# Patient Record
Sex: Female | Born: 1970
Health system: Southern US, Community
[De-identification: ages and names within clinical notes are randomized; demographics above are authoritative.]

## PROBLEM LIST (undated history)

## (undated) DIAGNOSIS — E785 Hyperlipidemia, unspecified: Secondary | ICD-10-CM

## (undated) DIAGNOSIS — M5430 Sciatica, unspecified side: Secondary | ICD-10-CM

## (undated) DIAGNOSIS — I2510 Atherosclerotic heart disease of native coronary artery without angina pectoris: Secondary | ICD-10-CM

## (undated) DIAGNOSIS — M1990 Unspecified osteoarthritis, unspecified site: Secondary | ICD-10-CM

## (undated) DIAGNOSIS — R112 Nausea with vomiting, unspecified: Secondary | ICD-10-CM

## (undated) DIAGNOSIS — Z9889 Other specified postprocedural states: Secondary | ICD-10-CM

## (undated) DIAGNOSIS — K5792 Diverticulitis of intestine, part unspecified, without perforation or abscess without bleeding: Secondary | ICD-10-CM

## (undated) DIAGNOSIS — I10 Essential (primary) hypertension: Secondary | ICD-10-CM

## (undated) DIAGNOSIS — G5603 Carpal tunnel syndrome, bilateral upper limbs: Secondary | ICD-10-CM

## (undated) DIAGNOSIS — I219 Acute myocardial infarction, unspecified: Secondary | ICD-10-CM

## (undated) DIAGNOSIS — E781 Pure hyperglyceridemia: Secondary | ICD-10-CM

## (undated) DIAGNOSIS — E119 Type 2 diabetes mellitus without complications: Secondary | ICD-10-CM

## (undated) HISTORY — PX: SMALL INTESTINE SURGERY: SHX150

## (undated) HISTORY — PX: CARDIAC CATHETERIZATION: SHX172

## (undated) HISTORY — PX: TUBAL LIGATION: SHX77

## (undated) HISTORY — DX: Hyperlipidemia, unspecified: E78.5

## (undated) HISTORY — PX: CHOLECYSTECTOMY: SHX55

## (undated) HISTORY — PX: JOINT REPLACEMENT: SHX530

## (undated) HISTORY — PX: KNEE SURGERY: SHX244

## (undated) HISTORY — DX: Sciatica, unspecified side: M54.30

## (undated) HISTORY — PX: OTHER SURGICAL HISTORY: SHX169

## (undated) HISTORY — DX: Acute myocardial infarction, unspecified: I21.9

---

## 1997-09-14 ENCOUNTER — Emergency Department (HOSPITAL_COMMUNITY): Admission: EM | Admit: 1997-09-14 | Discharge: 1997-09-14 | Payer: Self-pay | Admitting: Emergency Medicine

## 2001-01-30 ENCOUNTER — Other Ambulatory Visit: Admission: RE | Admit: 2001-01-30 | Discharge: 2001-01-30 | Payer: Self-pay | Admitting: Obstetrics and Gynecology

## 2001-03-05 ENCOUNTER — Encounter: Payer: Self-pay | Admitting: Obstetrics and Gynecology

## 2001-03-05 ENCOUNTER — Ambulatory Visit (HOSPITAL_COMMUNITY): Admission: RE | Admit: 2001-03-05 | Discharge: 2001-03-05 | Payer: Self-pay | Admitting: Obstetrics and Gynecology

## 2001-07-04 ENCOUNTER — Encounter: Payer: Self-pay | Admitting: Emergency Medicine

## 2001-07-04 ENCOUNTER — Emergency Department (HOSPITAL_COMMUNITY): Admission: EM | Admit: 2001-07-04 | Discharge: 2001-07-04 | Payer: Self-pay | Admitting: Emergency Medicine

## 2001-08-03 ENCOUNTER — Ambulatory Visit (HOSPITAL_COMMUNITY): Admission: AD | Admit: 2001-08-03 | Discharge: 2001-08-03 | Payer: Self-pay | Admitting: Obstetrics and Gynecology

## 2001-08-10 ENCOUNTER — Ambulatory Visit (HOSPITAL_COMMUNITY): Admission: AD | Admit: 2001-08-10 | Discharge: 2001-08-10 | Payer: Self-pay | Admitting: Obstetrics and Gynecology

## 2001-08-14 ENCOUNTER — Ambulatory Visit (HOSPITAL_COMMUNITY): Admission: AD | Admit: 2001-08-14 | Discharge: 2001-08-14 | Payer: Self-pay | Admitting: Obstetrics and Gynecology

## 2001-08-25 ENCOUNTER — Inpatient Hospital Stay (HOSPITAL_COMMUNITY): Admission: RE | Admit: 2001-08-25 | Discharge: 2001-08-26 | Payer: Self-pay | Admitting: Obstetrics and Gynecology

## 2001-10-19 ENCOUNTER — Ambulatory Visit (HOSPITAL_COMMUNITY): Admission: RE | Admit: 2001-10-19 | Discharge: 2001-10-19 | Payer: Self-pay | Admitting: Obstetrics & Gynecology

## 2001-11-04 ENCOUNTER — Emergency Department (HOSPITAL_COMMUNITY): Admission: EM | Admit: 2001-11-04 | Discharge: 2001-11-04 | Payer: Self-pay | Admitting: *Deleted

## 2001-11-04 ENCOUNTER — Encounter: Payer: Self-pay | Admitting: *Deleted

## 2003-10-16 ENCOUNTER — Ambulatory Visit (HOSPITAL_COMMUNITY): Admission: RE | Admit: 2003-10-16 | Discharge: 2003-10-16 | Payer: Self-pay | Admitting: Internal Medicine

## 2003-11-17 ENCOUNTER — Emergency Department (HOSPITAL_COMMUNITY): Admission: EM | Admit: 2003-11-17 | Discharge: 2003-11-17 | Payer: Self-pay | Admitting: Emergency Medicine

## 2004-04-14 ENCOUNTER — Ambulatory Visit (HOSPITAL_COMMUNITY): Admission: RE | Admit: 2004-04-14 | Discharge: 2004-04-14 | Payer: Self-pay | Admitting: Internal Medicine

## 2004-04-27 ENCOUNTER — Emergency Department (HOSPITAL_COMMUNITY): Admission: EM | Admit: 2004-04-27 | Discharge: 2004-04-27 | Payer: Self-pay | Admitting: Emergency Medicine

## 2005-01-25 ENCOUNTER — Ambulatory Visit (HOSPITAL_COMMUNITY): Admission: RE | Admit: 2005-01-25 | Discharge: 2005-01-25 | Payer: Self-pay | Admitting: Internal Medicine

## 2005-01-27 ENCOUNTER — Ambulatory Visit: Payer: Self-pay | Admitting: Orthopedic Surgery

## 2005-02-07 ENCOUNTER — Ambulatory Visit: Payer: Self-pay | Admitting: Orthopedic Surgery

## 2005-02-10 ENCOUNTER — Ambulatory Visit (HOSPITAL_COMMUNITY): Admission: RE | Admit: 2005-02-10 | Discharge: 2005-02-10 | Payer: Self-pay | Admitting: Orthopedic Surgery

## 2005-02-16 ENCOUNTER — Ambulatory Visit: Payer: Self-pay | Admitting: Orthopedic Surgery

## 2005-03-04 ENCOUNTER — Ambulatory Visit (HOSPITAL_COMMUNITY): Admission: RE | Admit: 2005-03-04 | Discharge: 2005-03-04 | Payer: Self-pay | Admitting: Internal Medicine

## 2005-04-21 ENCOUNTER — Observation Stay (HOSPITAL_COMMUNITY): Admission: EM | Admit: 2005-04-21 | Discharge: 2005-04-22 | Payer: Self-pay | Admitting: Emergency Medicine

## 2005-04-22 ENCOUNTER — Ambulatory Visit: Payer: Self-pay | Admitting: Internal Medicine

## 2005-11-24 ENCOUNTER — Encounter: Admission: RE | Admit: 2005-11-24 | Discharge: 2005-11-24 | Payer: Self-pay | Admitting: Internal Medicine

## 2006-04-06 ENCOUNTER — Inpatient Hospital Stay (HOSPITAL_COMMUNITY): Admission: AD | Admit: 2006-04-06 | Discharge: 2006-04-06 | Payer: Self-pay | Admitting: Obstetrics and Gynecology

## 2006-06-13 ENCOUNTER — Ambulatory Visit (HOSPITAL_COMMUNITY): Admission: RE | Admit: 2006-06-13 | Discharge: 2006-06-13 | Payer: Self-pay | Admitting: Internal Medicine

## 2006-08-11 ENCOUNTER — Emergency Department (HOSPITAL_COMMUNITY): Admission: EM | Admit: 2006-08-11 | Discharge: 2006-08-11 | Payer: Self-pay | Admitting: Emergency Medicine

## 2007-04-27 ENCOUNTER — Ambulatory Visit (HOSPITAL_COMMUNITY): Admission: RE | Admit: 2007-04-27 | Discharge: 2007-04-27 | Payer: Self-pay | Admitting: Obstetrics

## 2007-04-27 ENCOUNTER — Encounter (INDEPENDENT_AMBULATORY_CARE_PROVIDER_SITE_OTHER): Payer: Self-pay | Admitting: Obstetrics

## 2007-05-02 ENCOUNTER — Emergency Department (HOSPITAL_COMMUNITY): Admission: EM | Admit: 2007-05-02 | Discharge: 2007-05-02 | Payer: Self-pay | Admitting: *Deleted

## 2007-11-07 ENCOUNTER — Ambulatory Visit: Payer: Self-pay | Admitting: Orthopedic Surgery

## 2007-11-07 DIAGNOSIS — M545 Low back pain, unspecified: Secondary | ICD-10-CM | POA: Insufficient documentation

## 2007-11-07 DIAGNOSIS — M5430 Sciatica, unspecified side: Secondary | ICD-10-CM | POA: Insufficient documentation

## 2007-11-07 DIAGNOSIS — M5380 Other specified dorsopathies, site unspecified: Secondary | ICD-10-CM | POA: Insufficient documentation

## 2007-11-12 ENCOUNTER — Emergency Department (HOSPITAL_COMMUNITY): Admission: EM | Admit: 2007-11-12 | Discharge: 2007-11-12 | Payer: Self-pay | Admitting: Emergency Medicine

## 2007-11-12 ENCOUNTER — Telehealth: Payer: Self-pay | Admitting: Orthopedic Surgery

## 2007-11-13 ENCOUNTER — Telehealth: Payer: Self-pay | Admitting: Orthopedic Surgery

## 2008-04-14 ENCOUNTER — Emergency Department (HOSPITAL_COMMUNITY): Admission: EM | Admit: 2008-04-14 | Discharge: 2008-04-14 | Payer: Self-pay | Admitting: Emergency Medicine

## 2008-12-01 ENCOUNTER — Emergency Department (HOSPITAL_COMMUNITY): Admission: EM | Admit: 2008-12-01 | Discharge: 2008-12-01 | Payer: Self-pay | Admitting: Emergency Medicine

## 2009-02-26 ENCOUNTER — Emergency Department (HOSPITAL_COMMUNITY): Admission: EM | Admit: 2009-02-26 | Discharge: 2009-02-26 | Payer: Self-pay | Admitting: Emergency Medicine

## 2009-03-04 ENCOUNTER — Emergency Department (HOSPITAL_COMMUNITY): Admission: EM | Admit: 2009-03-04 | Discharge: 2009-03-04 | Payer: Self-pay | Admitting: Emergency Medicine

## 2010-06-13 ENCOUNTER — Emergency Department (HOSPITAL_COMMUNITY): Payer: Self-pay

## 2010-06-13 ENCOUNTER — Emergency Department (HOSPITAL_COMMUNITY)
Admission: EM | Admit: 2010-06-13 | Discharge: 2010-06-13 | Disposition: A | Discharge status: Home / Self Care | Payer: Self-pay | Attending: Emergency Medicine | Admitting: Emergency Medicine

## 2010-06-13 DIAGNOSIS — R197 Diarrhea, unspecified: Secondary | ICD-10-CM | POA: Insufficient documentation

## 2010-06-13 DIAGNOSIS — K5289 Other specified noninfective gastroenteritis and colitis: Secondary | ICD-10-CM | POA: Insufficient documentation

## 2010-06-13 DIAGNOSIS — R112 Nausea with vomiting, unspecified: Secondary | ICD-10-CM | POA: Insufficient documentation

## 2010-06-13 LAB — COMPREHENSIVE METABOLIC PANEL
ALT: 56 U/L — ABNORMAL HIGH (ref 0–35)
AST: 30 U/L (ref 0–37)
Albumin: 4.6 g/dL (ref 3.5–5.2)
CO2: 21 mEq/L (ref 19–32)
Calcium: 9.5 mg/dL (ref 8.4–10.5)
Chloride: 102 mEq/L (ref 96–112)
Creatinine, Ser: 0.87 mg/dL (ref 0.4–1.2)
GFR calc Af Amer: >60 mL/min (ref >60)
GFR calc non Af Amer: >60 mL/min (ref >60)
Potassium: 3.5 mEq/L (ref 3.5–5.1)
Sodium: 136 mEq/L (ref 135–145)
Total Bilirubin: 0.5 mg/dL (ref 0.3–1.2)
Total Protein: 8 g/dL (ref 6.0–8.3)

## 2010-06-13 LAB — URINALYSIS, ROUTINE W REFLEX MICROSCOPIC
Leukocytes, UA: NEGATIVE
Nitrite: NEGATIVE
Protein, ur: 30 mg/dL — AB
Urine Glucose, Fasting: NEGATIVE mg/dL
pH: 6 (ref 5.0–8.0)

## 2010-06-13 LAB — CBC
HCT: 48.4 % — ABNORMAL HIGH (ref 36.0–46.0)
Hemoglobin: 16.9 g/dL — ABNORMAL HIGH (ref 12.0–15.0)
MCH: 30.2 pg (ref 26.0–34.0)
MCHC: 34.9 g/dL (ref 30.0–36.0)
MCV: 86.6 fL (ref 78.0–100.0)
Platelets: 288 10*3/uL (ref 150–400)
RBC: 5.59 MIL/uL — ABNORMAL HIGH (ref 3.87–5.11)
WBC: 10.6 10*3/uL — ABNORMAL HIGH (ref 4.0–10.5)

## 2010-06-13 LAB — DIFFERENTIAL
Basophils Absolute: 0 10*3/uL (ref 0.0–0.1)
Basophils Relative: 0 % (ref 0–1)
Eosinophils Absolute: 0.1 10*3/uL (ref 0.0–0.7)
Eosinophils Relative: 1 % (ref 0–5)
Lymphocytes Relative: 18 % (ref 12–46)
Monocytes Absolute: 1 10*3/uL (ref 0.1–1.0)
Neutro Abs: 7.6 10*3/uL (ref 1.7–7.7)
Neutrophils Relative %: 72 % (ref 43–77)

## 2010-06-13 LAB — PREGNANCY, URINE: Preg Test, Ur: NEGATIVE

## 2010-06-13 LAB — URINE MICROSCOPIC-ADD ON

## 2010-09-07 NOTE — Op Note (Signed)
NAMEJAYLEENE, [name] NO.:  [id number]   MEDICAL RECORD NO.:  [id number]          PATIENT TYPE:  AMB   LOCATION:  SDC                           FACILITY:  [facility]   PHYSICIAN:  Kathreen Cosier, M.D.DATE OF BIRTH:  November 30, 1970   DATE OF PROCEDURE:  04/27/2007  DATE OF DISCHARGE:                               OPERATIVE REPORT   PREOPERATIVE DIAGNOSIS:  Dysfunctional uterine bleeding.   POSTOPERATIVE DIAGNOSIS:  Dysfunctional uterine bleeding.   PROCEDURE:  Hysteroscopy, dilation and curettage, and NovaSure ablation.   ANESTHESIA:  General.   DESCRIPTION OF PROCEDURE:  The patient in lithotomy position, perineum  and vagina prepped and draped.  Bladder emptied with a straight  catheter.  Bimanual exam revealed uterus to be top normal size.  No  prolapse.  Negative adnexa.  Speculum placed in the vagina.  Cervix  injected with 9 mL of 1% Xylocaine.  Endocervix curetted small amount of  tissue obtained.  Endometrial cavity sounded to 9 cm.  Cervix was  measured at 5 cm.  Cavity length 4 cm.  Cervix dilated with #25 Shawnie Pons.  Hysteroscope inserted.  Cavity normal.  Hysteroscope removed and sharp  curettage performed.  Moderate amount of tissue obtained.  NovaSure  device inserted in the cavity.  Integrity test passed.  Cavity width was  4 cm.  Ablation was performed for 1 minute and 40 seconds at 88 watts.  Post ablation hysteroscopy repeated.  Cavity was totally ablated.  The  patient tolerated the procedure well and went to the recovery room in  good condition.           ______________________________  Kathreen Cosier, M.D.     BAM/MEDQ  D:  04/27/2007  T:  04/27/2007  Job:  [id number]

## 2010-09-10 NOTE — Op Note (Signed)
[facility]  Patient:    [name], [name] Visit Number: [id number] MRN: [id number]          Service Type: OBS Location: 4A A417 01 Attending Physician:  Tilda Burrow Dictated by:   Pat Kocher, CNM Proc. Date: 08/25/01 Admit Date:  08/24/2001 Discharge Date: 08/26/2001   CC:         [facility]  Lilyan Punt, M.D.   Operative Report  ATTENDING NOT GIVEN  DELIVERY NOTE:  At 0445, a decision was made to perform a cesarean section due to fetal uncertain status. The patient was given two doses of terbutaline and was prepped to go to the OR. At approximately 0500, the patient developed an irresistible urge to push and was noted to be 9 cm dilated. At this point, the fetal heart rate was in the 150s average, long-term variability and no decelerations. She was allowed to proceed pushing with the OR crew in-house and the pediatrician attending. The patient delivered spontaneously at 0536 a viable female infant in the occiput posterior position. Mouth and nose were suctioned. Apgar 8 and 9, weight 6 pounds 3 ounces. The placenta separated spontaneously and was delivered at 0541. The vagina was inspected and a small second degree perineal laceration was found. Under local anesthesia, 1% xylocaine, the repair was made using a 2-0 Vicryl. The fundus was firm, very little blood loss was noted. Estimated blood loss 250 cc. After only one dose of labetalol, the patient became normotensive and has stayed that way until now. We will monitor her blood pressures closely. Anticipate a reversion back to normal blood pressures. Dictated by:   Pat Kocher, CNM Attending Physician:  Tilda Burrow DD:  08/25/01 TD:  08/27/01 Job: [id number]

## 2010-09-10 NOTE — Op Note (Signed)
NAMELAQUITTA, DOMINSKI NO.:  [id number]   MEDICAL RECORD NO.:  [id number]          PATIENT TYPE:  OBV   LOCATION:  A328                          FACILITY:  [facility]   PHYSICIAN:  Lionel December, M.D.    DATE OF BIRTH:  1971/04/12   DATE OF PROCEDURE:  04/22/2005  DATE OF DISCHARGE:                                 OPERATIVE REPORT   PROCEDURE:  Esophagogastroduodenoscopy.   INDICATION:  [name] is 40 year old Caucasian female who presents with acute  onset of nausea and vomiting. While these symptoms have cleared, she  developed melena and, therefore, undergoing this exam. Procedure and risks  were reviewed the patient, and informed consent was obtained.   MEDICINES FOR CONSCIOUS SEDATION:  Cetacaine spray for oropharyngeal topical  anesthesia Demerol 15 mg IV, Versed 6 mg IV.   FINDINGS:  Procedure performed in endoscopy suite. The patient's vital signs  and O2 saturation were monitored during the procedure and remained stable.  The patient was placed in the left lateral position and Olympus videoscope  was passed via oropharynx without any difficulty into esophagus.   ESOPHAGUS:  Mucosa of the esophagus normal. Three erosions were noted at GE  junction; one was quite prominent, and no active bleeding noted. Hiatus was  38. She had small sliding hiatal hernia.   STOMACH:  It was empty and distended very well with insufflation. Folds of  the proximal stomach were normal. Examination of mucosa revealed single  prepyloric erosion with surrounding mucosal edema, but no ulcer crater was  found. Pyloric channel was patent. Angularis, fundus, and cardia were  examined by retroflexing the scope were normal.   DUODENUM:  Bulbar mucosa was normal. Scope was passed this into the second  part of the duodenum where the mucosa and folds were normal. The endoscope  was withdrawn. The patient tolerated the procedure well.   FINAL DIAGNOSIS:  1.  Erosive reflux esophagitis.  2.   Erosions are limited to the GE junction.  3.  Small sliding hiatal hernia.  4.  No evidence of Mallory-Weiss tear or peptic ulcer disease.  5.  It is possible that her melena is secondary to trivial bleed from      erosive esophagitis, gastritis, or she could have healed Mallory-Weiss      tear.   RECOMMENDATIONS:  1.  Anti-reflux measures. Will leave her on Protonix 40 mg q.a.m..  2.  H pylori serology.      Lionel December, M.D.  Electronically Signed     NR/MEDQ  D:  04/22/2005  T:  04/22/2005  Job:  [id number]   cc:   Tesfaye D. Felecia Shelling, MD  Fax: [phone]

## 2010-09-10 NOTE — Consult Note (Signed)
NAMERONIN, CRAGER                 ACCOUNT NO.:  [id number]   MEDICAL RECORD NO.:  [id number]          PATIENT TYPE:  OBV   LOCATION:  A328                          FACILITY:  [facility]   PHYSICIAN:  Edward L. Juanetta Gosling, M.D.DATE OF BIRTH:  1971/02/27   DATE OF CONSULTATION:  04/21/2005  DATE OF DISCHARGE:                                   CONSULTATION   [name] is a 40 year old who apparently had been in Dr. Letitia Neri office  today. Was told to come to the hospital for admission but when she came to  the hospital she did not have any orders. She had been evaluated by the  emergency room physician, had lab work done, all of which was essentially  normal but she had a history of black stool and was thought to be having GI  bleeding. I have asked for her to have H&H frequently. Start her on  Protonix, continue with her home medications. Continue with close monitoring  of her hemoglobin. Check stools for blood, and then Dr. Felecia Shelling will see her  in the morning. I did not see her because apparently he had intended to go  ahead and leave orders and just through some over sight or miscommunication  it was not done.      Edward L. Juanetta Gosling, M.D.  Electronically Signed     ELH/MEDQ  D:  04/21/2005  T:  04/21/2005  Job:  [id number]

## 2010-09-10 NOTE — Op Note (Signed)
[facility]  Patient:    [name], [name] Visit Number: [id number] MRN: [id number]          Service Type: DSU Location: DAY Attending Physician:  Lazaro Arms Dictated by:   Duane Lope, M.D. Proc. Date: 10/19/01 Admit Date:  10/19/2001 Discharge Date: 10/19/2001                             Operative Report  PREOPERATIVE DIAGNOSIS:   Multiparous female desires permanent sterilization.  POSTOPERATIVE DIAGNOSIS:  Multiparous female desires permanent sterilization.  OPERATION:  Laparoscopic tubal ligation using electrocautery.  SURGEON:  Duane Lope, M.D.  ANESTHESIA:  General endotracheal.  FINDINGS:  The patient had normal uterus, tubes and ovaries.  DESCRIPTION OF PROCEDURE:  The patient was taken to the operating room and placed in the supine position and underwent general endotracheal anesthesia. She was placed in the dorsal lithotomy position.  Her vagina was prepped. Hulka tenaculum was placed for uterine manipulation.  Her abdomen was prepped and draped in the usual sterile fashion, and 0.5% Marcaine with epinephrine was injected in the umbilicus in a fan-like fashion, and I waited a couple of minutes to allow that to absorb, and then made in the incision in the umbilicus and tried to use a pistol grip under direct visualization.  However, the trocar was not long enough.  I then did an open laparoscopy, direct visualization, gaining access to the peritoneum.  That trocar still would not reach and I had to get a long trocar.  I was finally able to get that intraperitoneal.  The abdomen was insufflated.  The patient was placed in Trendelenburg position.  Both tubes were identified and burned in the distal isthmic ampullary region of each tube to no resistance and beyond using the electrocautery unit.  There was good hemostasis bilaterally.  Pictures were taken.  The gas was allowed to escape.  The instruments were removed.  The fascia was  closed with a single 0 Vicryl suture, and the skin was closed using 3-0 Vicryl in a subcuticular fashion.  The patient tolerated the procedure well.  She experienced no blood loss and was taken to the recovery room in good and stable condition as all counts were correct. Dictated by:   Duane Lope, M.D. Attending Physician:  Lazaro Arms DD:  10/19/01 TD:  10/21/01 Job: [id number]

## 2010-09-10 NOTE — H&P (Signed)
[facility]  Patient:    Bridget, [name] Visit Number: [id number] MRN: [id number]          Service Type: OBS Location: 4A A417 01 Attending Physician:  Tilda Burrow Dictated by:   Duane Lope, M.D. Admit Date:  08/24/2001 Discharge Date: 08/26/2001                           History and Physical  HISTORY OF PRESENT ILLNESS:  The patient is a 40 year old white female, gravida 3, para 2, abortus 1, who is about 7 weeks now postoperative from a cesarean section because of uncertain fetal status.  Baby is doing well. Patient is doing well.  She has elected at this time to undergo permanent sterilization.  She understands the permanent nature of the procedure and will proceed.  PAST MEDICAL HISTORY:  Pregnancy-induced hypertension with her first pregnancy.  PAST SURGICAL HISTORY:  Cholecystectomy.  PAST OBSTETRICAL HISTORY:  Two vaginal deliveries.  She was to have a C section because of uncertain fetal status but delivered spontaneously and a miscarriage in 84.  REVIEW OF SYSTEMS:  Otherwise negative.  PHYSICAL EXAMINATION:  HEENT:  Unremarkable.  NECK:  Thyroid is normal.  LUNGS:  Clear.  HEART:  Regular rate and rhythm without murmur, regurge, or gallop.  BREASTS:  Deferred.  ABDOMEN:  Benign.  Involuted uterus and pelvic exam.  Pelvic exam is completely benign.  EXTREMITIES:  Warm with no edema.  NEUROLOGIC:  Exam grossly intact.  IMPRESSION:  Multiparous female who desires permanent sterilization.  PLAN:  Patient wants a permanent sterilization procedure.  She understands the alternatives of this including birth control pills, an IUD, NuvaRing patch, Depo-Provera, but wants permanent sterilization.  She understands the risk of failure is one in 300 and also the relative irreversibility of the procedure. Dictated by:   Duane Lope, M.D. Attending Physician:  Tilda Burrow DD:  10/18/01 TD:  10/18/01 Job:  [id number]

## 2010-09-10 NOTE — H&P (Signed)
[name], [name]                 ACCOUNT NO.:  [id number]   MEDICAL RECORD NO.:  [id number]          PATIENT TYPE:  OBV   LOCATION:  A328                          FACILITY:  [facility]   PHYSICIAN:  Tesfaye D. Felecia Shelling, MD   DATE OF BIRTH:  04/06/1971   DATE OF ADMISSION:  04/21/2005  DATE OF DISCHARGE:  LH                                HISTORY & PHYSICAL   CHIEF COMPLAINT:  Nausea, vomiting and dark stool.   HISTORY OF PRESENT ILLNESS:  This is a 40 year old, female patient with no  significant past medical history who came to the office with above  complaint.  The patient was in her usual state of health until 2 days back  when she started having nausea and vomiting.  The patient also noticed dark  stool and she started feeling dizzy.  The patient came to the office where  she was evaluated.  Her stool guaiac was positive and her stool was dark and  tarry.  The patient was then sent to the emergency room for further  evaluation.  She was seen in the emergency room where her CBC and Chem 7 was  done.  The patient had no significant anemia.  She was then admitted under  observation to further monitor her CBC and do GI evaluation.  The patient is  currently feeling better.  She had no nausea or vomiting since admission.   REVIEW OF SYSTEMS:  No headache, fever, chest pain, cough, shortness of  breath, dysuria, urgency or frequency of urination.   PAST MEDICAL HISTORY:  1.  Status post cholecystectomy.  2.  Pregnancy-induced hypertension.  3.  Back pain.   CURRENT MEDICATIONS:  1.  Flexeril 5 mg p.o. q.6h. p.r.n.  2.  Hydrocodone 5/500 one tablet p.o. q.6h. p.r.n.   SOCIAL HISTORY:  The patient is married.  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  No history of alcohol or substance  abuse.  The patient smokes cigarettes.   PHYSICAL EXAMINATION:  GENERAL:  The patient is alert and awake and  comfortable.  VITAL SIGNS:  Blood pressure 97/50, pulse 66,  respirations 18, temperature  98 degrees Fahrenheit.  HEENT:  Pupils are equal and reactive.  CHEST:  Clear lung fields.  Good air entry.  CARDIAC:  S1, S2 heard.  No murmurs, rubs or gallops.  ABDOMEN:  Soft and lax.  Bowel sounds positive.  No mass or organomegaly.  EXTREMITIES:  No leg edema.   LABORATORY DATA AND X-RAY FINDINGS:  CBC with WBC 12.8, hemoglobin 13.9,  hematocrit 40.8, platelets 367.  PT 12.5, INR 0.9.  Sodium 139, potassium  3.3, chloride 105, carbon dioxide 24, glucose 82, BUN 12, creatinine 0.7.  Total bilirubin 0.3, AST 83, ALT 25, total protein 7.5, albumin 4.1, calcium  8.9.  Repeat CBC with hemoglobin 12.9 and hematocrit 37.3.   ASSESSMENT:  This is a 40 year old female patient with no significant  medical history who came with positive stool guaiac, nausea and vomiting.  She had no hematemesis, however, the patient feels dizzy.  The patient  obviously has a gastrointestinal bleed which may not be acute.   PLAN:  1.  Continue the patient on IV Protonix.  2.  Continue IV fluids.  3.  GI consult.  4.  Continue her pain medications.      Tesfaye D. Felecia Shelling, MD  Electronically Signed     TDF/MEDQ  D:  04/22/2005  T:  04/22/2005  Job:  [id number]

## 2010-09-10 NOTE — H&P (Signed)
[facility]  Patient:    [name], [name] Visit Number: [id number] MRN: [id number]          Service Type: OBS Location: 4A A414 01 Attending Physician:  Tilda Burrow Dictated by:   Chilton Si, C.N.M. Admit Date:  08/14/2001 Discharge Date: 08/14/2001   CC:         [facility]   History and Physical  CHIEF COMPLAINT:  Contractions throughout the day, becoming more regular tonight.  HISTORY OF PRESENT ILLNESS:  The patient is a 40 year old gravida 3, para 1-0-1-1, with an EDC of Sep 20, 2001, based on last menstrual period and three correlating ultrasounds, placing her at 36 weeks 2 days gestation.  She began prenatal care early in the first trimester and had regular visits throughout.  She has had a total weight gain of 36 pounds with a larger than expected fundal height growth.  AFI was normal in March.  PRENATAL LABORATORIES:  Blood type B positive.  Rubella is equivocal.  HBsAg, HIV, Gonorrhea, Chlamydia, group B strep are all normal.  One-hour GTT was abnormal at 153 with a three-hour GTT of normal limits.  HISTORY OF PREGNANCY:  Her preterm course has been complicated by some preterm labor for which she had minimal cervical change.  Blood pressures have been 120s-150s/60s-90s.  PAST MEDICAL HISTORY:  Pregnancy-induced hypertension during labor for which she received magnesium sulfate.  SURGICAL HISTORY:  Cholecystectomy.  No history of blood transfusions or anesthesia complications.  ALLERGIES:  No known drug allergies.  SOCIAL HISTORY:  She is married, is a Producer, television/film/video.  Has decreased her smoking to less than one-half a pack per day.  Denies alcohol or recreational drug use.  FAMILY HISTORY:  Diabetes, hypertension and cancer.  OBSTETRICAL HISTORY:  Includes a vaginal delivery in 1995 at 37 weeks of a 6 pound 3 ounce female for which she had hypertension during labor and a spontaneous abortion in  1990 at 8 weeks.  PHYSICAL EXAMINATION:  HEENT:  Within normal limits.  HEART:  Regular rate and rhythm.  LUNGS:  Clear to auscultation.  BREAST EXAM:  Deferred.  ABDOMEN:  Contractions at this point of moderate intensity, every 2-3 minutes. Fetal heart rate started off in the 150s with average variability, accelerations present and no decelerations, however, over the past couple of hours she has shown a tendency towards having late decelerations with most contractions.  She has been on therapy of position changes, oxygen by a rebreather mask and boluses with lactated Ringers.  Blood pressures have been 160s/80s to 190s/100s.  She has received an IV dose of labetalol at this time.  EXTREMITIES:  Trace edema in dependent extremities with normal reflexes.  PELVIC EXAM:  Upon arrival to the unit was 1 cm and 50% effaced, posterior. She has quickly made progress to 5-6, 90%, -1 station and had spontaneous rupture of membranes at approximately 2 oclock.  LABORATORY DATA:  Admission labs are within normal limits with platelets of 282.  Urinalysis was trace protein.  IMPRESSION: 1. Intrauterine pregnancy at 36 weeks 2 days. 2. Active labor. 3. Uncertain fetal status. 4. Pregnancy-induced hypertension.  PLAN:  Dr. Emelda Fear was notified.  The patient received a subcu dose of 0.25 mg of terbutaline. Dictated by:   Chilton Si, C.N.M. Attending Physician:  Tilda Burrow DD:  08/25/00 TD:  08/25/01 Job: [id number]

## 2010-09-10 NOTE — Consult Note (Signed)
[name], [name] NO.:  [id number]   MEDICAL RECORD NO.:  [id number]          PATIENT TYPE:  OBV   LOCATION:  A328                          FACILITY:  [facility]   PHYSICIAN:  Lionel December, M.D.    DATE OF BIRTH:  06/08/1970   DATE OF CONSULTATION:  04/22/2005  DATE OF DISCHARGE:                                   CONSULTATION   REFERRING PHYSICIAN:  Tesfaye D. Felecia Shelling, M.D.   REASON FOR CONSULTATION:  Melena.   HISTORY OF PRESENT ILLNESS:  [name] is a 40 year old, Caucasian female and  R.N. who was in her usual state of health until the day of Christmas when  she felt tired and just did not feel well.  She started with a period with  excessive bleeding.  She thought that was the reason for her not feeling  well.  However, on December 26, she developed nausea, vomiting and diarrhea.  She did not experience hematemesis, however, yesterday she noted her stools  were black.  By this time, her vomiting had stopped.  She did not experience  any abdominal pain.  She was seen by Dr. Felecia Shelling and noted to have tarry, heme  positive stool and therefore hospitalized for further management.  She was  begun on IV fluids and IV Protonix.  This morning, she feels better.  She  did have a bowel movement which was semi-formed and was dark, but not black  like yesterday.  There is no history of peptic ulcer disease.  She denies  chronic heartburn or dysphagia.  She states she has had a good appetite  until this illness and she has not lost any weight.  She is presently on  Protonix 40 mg IV q.24h.   MEDICATIONS PRIOR TO ADMISSION:  1.  Hydrocodone 5/500 t.i.d. p.r.n. which she does not take daily for      musculoskeletal pain.  2.  Flexeril 10 mg t.i.d. p.r.n. which she takes occasionally.  3.  Tylenol p.r.n.   PAST MEDICAL HISTORY:  1.  Hypertension.  2.  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      She has intermittent flare-ups and she takes  Flexeril and hydrocodone,      but not on daily basis.  3.  Right knee arthroscopy in 1993.  4.  Laparoscopic cholecystectomy about 6 years ago.  5.  Tubal ligation.   ALLERGIES:  No known drug allergies.   FAMILY HISTORY:  Positive for diabetes mellitus and hypertension.  Both  parents are diabetic.  One brother had insulin-dependent diabetes mellitus  and died of MI at age 84.  Father has had colonic polyps removed.  She has a  brother and sister in good health.   SOCIAL HISTORY:  She is divorced.  She has two boys in good health.  She is  now presently working at [employer] in [place].  She smokes  about 1-1/2 packs per day which she has done x10 years, but she does not  drink alcohol.   PHYSICAL EXAMINATION:  GENERAL:  Pleasant, mildly  obese, Caucasian female  who is in no acute distress.  Weight 220.4 pounds, height 5 feet 5.5 inches  tall.  VITAL SIGNS:  Pulse 76 per minute, blood pressure 118/68, temperature 98,  respirations 16.  HEENT:  Conjunctivae pink, sclerae nonicteric.  Oropharyngeal mucosa normal.  NECK:  No masses or thyromegaly.  CARDIAC:  Regular rate and rhythm with normal S1, S2, no murmurs, rubs or  gallops.  LUNGS:  Clear to auscultation.  ABDOMEN:  Full bowel sounds normal.  On palpation, soft and nontender  without organomegaly or masses.  RECTAL:  Deferred as she had one on admission.  She does not have peripheral  edema or clubbing.   LABORATORY DATA AND X-RAY FINDINGS:  WBC 12.0, H&H 13.9 and 40.8, platelet  count 367,000.  INR 0.9, PTT 29.  Sodium 137, potassium 3.3, chloride 105,  CO2 24, glucose 82, BUN 12, creatinine 0.7.  Bilirubin 0.3, Alk phos 83, AST  25, ALT 43, total protein 7.5, albumin 4.1, calcium 8.9.  H&H from this  morning is 12.9 and 37.3 which is same as last night which was 12.8 and  37.3.   ASSESSMENT:  [name] is a 40 year old, Caucasian female who presents with acute  onset of nausea, vomiting.  She then began with  tarry stools.  It appears to  be clearing.  She does not have chronic gastrointestinal symptoms.  Similarly, she does not take any nonsteroidal anti-inflammatory drugs.  I  suspect she had self-limiting gastroenteritis either viral or toxic from  which she has recovered.  She possibly bled from [facility] tear.  Other  sources would be peptic ulcer disease as well as gastritis.   She has mildly elevated ALT.  Suspect she may have fatty liver.  This needs  to be repeated and if it persists, she will need further workup to include  marker studies as well as hepatic ultrasound.   RECOMMENDATIONS:  Diagnostic esophagogastroduodenoscopy to be performed this  a.m.  I have reviewed the procedure and risks with the patient and she is  agreeable.   She should have LFTs repeated in a couple of weeks to make sure her ALT is  normal.   We would like to thank Dr. Felecia Shelling for the opportunity to participate in the  care of this nice lady.      Lionel December, M.D.  Electronically Signed     NR/MEDQ  D:  04/22/2005  T:  04/22/2005  Job:  [id number]

## 2010-09-29 ENCOUNTER — Ambulatory Visit (INDEPENDENT_AMBULATORY_CARE_PROVIDER_SITE_OTHER): Payer: PRIVATE HEALTH INSURANCE | Admitting: Orthopedic Surgery

## 2010-09-29 ENCOUNTER — Encounter: Payer: Self-pay | Admitting: Orthopedic Surgery

## 2010-09-29 ENCOUNTER — Other Ambulatory Visit: Payer: Self-pay | Admitting: Orthopedic Surgery

## 2010-09-29 VITALS — HR 70 | Resp 16 | Ht 64.0 in | Wt 209.0 lb

## 2010-09-29 DIAGNOSIS — G5600 Carpal tunnel syndrome, unspecified upper limb: Secondary | ICD-10-CM

## 2010-09-29 MED ORDER — GABAPENTIN 100 MG PO CAPS: 100.0000 mg | ORAL_CAPSULE | Freq: Three times a day (TID) | ORAL | Status: DC

## 2010-09-29 NOTE — Progress Notes (Signed)
Addended by: Fuller Canada, MD E on: 09/29/2010 02:56 PM   Modules accepted: Orders

## 2010-09-29 NOTE — Progress Notes (Signed)
Problem  Referring practice [facility]  RIGHT wrist pain RIGHT hand numbness  Symptoms since April of 2012 but off and on for years things seem to have gotten worse.  LEFT hand also onset was gradual. No injury No treatment other than a wrist brace Quality/throbbing, stabbing Intensity 9/10 Better with ice and bracing Worse with use and type Other symptoms or associated symptoms numbness and tingling  Review of systems all were reviewed positive findings for heartburn numbness tingling joint pain swelling and muscle pain  History as recorded above   General: The patient is normally developed, with normal grooming and hygiene. There are no gross deformities. The body habitus is normal   CDV: The pulse and perfusion of the extremities are normal   LYMPH: There is no gross lymphadenopathy in the extremities   Skin: There are no rashes, ulcers or cafe-au-lait spot   Psyche: The patient is alert, awake and oriented.  Mood is normal   Neuro:  The coordination and balance are normal.  Sensation is normal. Reflexes are 2+ and equal   Musculoskeletal  RIGHT hand exam tenderness is noted in the forearm and over the carpal tunnel but no numbness or tingling is noted in the hand itself specifically the thumb index and ring finger.  Grip strength seems little bit weak.  Wrist range of motion is normal wrist is stable no ganglion cysts are noted.  Spurling test negative.  Elbow range of motion normal.  Forward elevation of the RIGHT shoulder normal for negative impingement signs.  Impression atypical neuralgia RIGHT upper extremity possible carpal tunnel syndrome  Recommend carpal tunnel test Vitamin D 600 mg twice a day Neurontin 100 mg daily increase to 200 mg at night if needed.  Follow up for review of carpal tunnel test

## 2010-10-01 ENCOUNTER — Telehealth: Payer: Self-pay | Admitting: Radiology

## 2010-10-01 NOTE — Telephone Encounter (Signed)
I faxed a referral for this patient to Dr. Gerilyn Pilgrim for NCS of her upper extremities.

## 2010-10-14 ENCOUNTER — Ambulatory Visit: Payer: PRIVATE HEALTH INSURANCE | Admitting: Orthopedic Surgery

## 2010-10-21 ENCOUNTER — Encounter: Payer: Self-pay | Admitting: Orthopedic Surgery

## 2010-10-21 ENCOUNTER — Ambulatory Visit (INDEPENDENT_AMBULATORY_CARE_PROVIDER_SITE_OTHER): Payer: PRIVATE HEALTH INSURANCE | Admitting: Orthopedic Surgery

## 2010-10-21 DIAGNOSIS — G5600 Carpal tunnel syndrome, unspecified upper limb: Secondary | ICD-10-CM

## 2010-10-21 NOTE — Patient Instructions (Signed)
Preop visit has been scheduled. Please go  to the preop visit.  If you are in any blood thinners or anti-inflammatories. Please stop one One week prior to surgery.  preop day on   Post op day on

## 2010-10-25 ENCOUNTER — Telehealth: Payer: Self-pay | Admitting: Orthopedic Surgery

## 2010-10-25 NOTE — Telephone Encounter (Signed)
Contacted [employer] insurance at ph [phone] re: pre-certification for out-patient surgery scheduled 11/10/10 at [facility].

## 2010-10-25 NOTE — Progress Notes (Signed)
Covert her preop visit  Dictated history and physical later.  Visit her review of nerve conduction studies and discussing carpal tunnel treatment  The patient has had a treatment which consisted of splinting, oral medications, activity modification still has significant symptoms in the RIGHT hand.  She like to proceed with carpal tunnel release  Discussed treatment with surgery and its complications including but not limited to scar pain recurrent symptoms loss of flexion wrist or hand  Patient wishes to proceed with surgery  Nerve conduction study has been reviewed and noted to be positive for carpal tunnel syndrome.

## 2010-11-02 NOTE — Telephone Encounter (Signed)
11/02/10 Follow up with [employer] re: outpatient procedure, CPT [id number], ICD9 354.0, scheduled 11/10/10 at [facility]. Reached pre-certification department and spoke with representative Robin A;   no pre-authorization required for out-patient surgery. Her name and date for reference. Eligibility department , Cordelia Pen, relates effective as of 12/24/09 and coverage is current, as per our E-verification system.

## 2010-11-03 ENCOUNTER — Other Ambulatory Visit (HOSPITAL_COMMUNITY): Payer: PRIVATE HEALTH INSURANCE

## 2010-11-04 ENCOUNTER — Encounter (HOSPITAL_COMMUNITY): Payer: Self-pay

## 2010-11-04 ENCOUNTER — Encounter (HOSPITAL_COMMUNITY)
Admission: RE | Admit: 2010-11-04 | Discharge: 2010-11-04 | Disposition: A | Discharge status: Home / Self Care | Payer: PRIVATE HEALTH INSURANCE | Source: Ambulatory Visit | Attending: Orthopedic Surgery | Admitting: Orthopedic Surgery

## 2010-11-04 HISTORY — DX: Nausea with vomiting, unspecified: R11.2

## 2010-11-04 HISTORY — DX: Carpal tunnel syndrome, bilateral upper limbs: G56.03

## 2010-11-04 HISTORY — DX: Other specified postprocedural states: Z98.890

## 2010-11-04 LAB — BASIC METABOLIC PANEL
GFR calc Af Amer: >60 mL/min (ref >60)
GFR calc non Af Amer: >60 mL/min (ref >60)
Potassium: 4.3 mEq/L (ref 3.5–5.1)
Sodium: 138 mEq/L (ref 135–145)

## 2010-11-04 LAB — CBC
Hemoglobin: 14.3 g/dL (ref 12.0–15.0)
MCHC: 34.4 g/dL (ref 30.0–36.0)
Platelets: 331 10*3/uL (ref 150–400)
RBC: 4.74 MIL/uL (ref 3.87–5.11)

## 2010-11-04 LAB — SURGICAL PCR SCREEN
MRSA, PCR: NEGATIVE
Staphylococcus aureus: NEGATIVE

## 2010-11-04 NOTE — Patient Instructions (Addendum)
[name]  11/04/2010   Your procedure is scheduled on:  Wednesday, 11/10/10  Report to [facility] at 06:15 AM.  Call this number if you have problems the morning of surgery: [phone]   Remember:   Do not eat food:After Midnight.  Do not drink clear liquids: After Midnight.  Take these medicines the morning of surgery with A SIP OF WATER: none   Do not wear jewelry, make-up or nail polish.  Do not bring valuables to the hospital.  Contacts, dentures or bridgework may not be worn into surgery.  Leave suitcase in the car. After surgery it may be brought to your room.  For patients admitted to the hospital, checkout time is 11:00 AM the day of discharge.   Patients discharged the day of surgery will not be allowed to drive home.  Name and phone number of your driver: [name]  Special Instructions: CHG Shower Shower 2 days before surgery and 1 day before surgery with Hibiclens.   Please read over the following fact sheets that you were given: Pain Booklet, MRSA Information, Surgical Site Infection Prevention and Anesthesia Post-op Instructions    Instructions Following General Anesthetic, Adult A nurse specialized in giving anesthesia (anesthetist) or a doctor specialized in giving anesthesia (anesthesiologist) gave you a medicine that made you sleep while a procedure was performed. For as long as 24 hours following this procedure, you may feel:  Dizzy.   Weak.   Drowsy.  AFTER YOUR SURGERY 1. After surgery, you will be taken to the recovery area where a nurse will monitor your progress. You will be allowed to go home when you are awake, stable, taking fluids well, and without complications.  2.  3. For the first 24 hours following an anesthetic:   Have a responsible person with you.   Do not drive a car. If you are alone, do not take public transportation.   Do not drink alcohol.   Do not take medicine that has not been prescribed by your caregiver.   Do not sign  important papers or make important decisions.   You may resume normal diet and activities as directed.   Change bandages (dressings) as directed.   Only take over-the-counter or prescription medicines for pain, discomfort, or fever as directed by your caregiver.  If you have questions or problems that seem related to the anesthetic, call the hospital and ask for the anesthetist or anesthesiologist on call. SEEK IMMEDIATE MEDICAL CARE IF:  You develop a rash.   You have difficulty breathing.   You have chest pain.   You develop any allergic problems.  Document Released: 07/18/2000 Document Re-Released: 07/06/2009 [facility] Patient Information 2011 [place], [place]. `````````````````````````````````````````

## 2010-11-09 NOTE — H&P (Signed)
  Diagnoses     CTS (carpal tunnel syndrome)   - Primary    354.0      Reason for Visit     Hand Problem    New problem, right hand and wrist pain with numbness off and on for years, no injury, last visit 10/2007 for hip pain.       Reason For Visit History Recorded        Current Vitals       Recorded User        09/29/2010  2:32 PM  Chasity Pricilla Holm, LPN           BP Pulse Temp (Src) Resp Ht Wt    N/A  70  N/A (N/A)  16  5\' 4"  (1.626 m)  209 lb (94.802 kg)       BMI SpO2 PF LMP    35.87 kg/m2  N/A  N/A  N/A          Progress Notes     Fuller Canada, MD  09/29/2010  2:54 PM  Signed Problem   Referring practice [facility]   RIGHT wrist pain RIGHT hand numbness   Symptoms since April of 2012 but off and on for years things seem to have gotten worse.  LEFT hand also onset was gradual. No injury No treatment other than a wrist brace Quality/throbbing, stabbing Intensity 9/10 Better with ice and bracing Worse with use and type Other symptoms or associated symptoms numbness and tingling   Review of systems all were reviewed positive findings for heartburn numbness tingling joint pain swelling and muscle pain   History as recorded above    General: The patient is normally developed, with normal grooming and hygiene. There are no gross deformities. The body habitus is normal    CDV: The pulse and perfusion of the extremities are normal    LYMPH: There is no gross lymphadenopathy in the extremities    Skin: There are no rashes, ulcers or cafe-au-lait spot    Psyche: The patient is alert, awake and oriented.   Mood is normal    Neuro:   The coordination and balance are normal.   Sensation is normal. Reflexes are 2+ and equal    Musculoskeletal  RIGHT hand exam tenderness is noted in the forearm and over the carpal tunnel but no numbness or tingling is noted in the hand itself specifically the thumb index and ring finger.  Grip strength  seems little bit weak.  Wrist range of motion is normal wrist is stable no ganglion cysts are noted.   Spurling test negative.  Elbow range of motion normal.  Forward elevation of the RIGHT shoulder normal for negative impingement signs.  We sent her for a NCS and it was positive  She wishes to proceed with RIGHT CARPAL TUNNEL RELEASE   You have been scheduled for surgery.  All surgeries carry some risk.  Remember you always have the option of continued nonsurgical treatment. However in this situation the risks vs. the benefits favor surgery as the best treatment option. The risks of the surgery includes the following but is not limited to bleeding, infection, pulmonary embolus, death from anesthesia, nerve injury vascular injury or need for further surgery, continued pain.  Specific to this procedure the following risks and complications are rare but possible Incisional pain and persistent numbness

## 2010-11-10 ENCOUNTER — Encounter (HOSPITAL_COMMUNITY)
Admission: RE | Disposition: A | Discharge status: Home / Self Care | Payer: Self-pay | Source: Ambulatory Visit | Attending: Orthopedic Surgery

## 2010-11-10 ENCOUNTER — Ambulatory Visit (HOSPITAL_COMMUNITY)
Admission: RE | Admit: 2010-11-10 | Discharge: 2010-11-10 | Disposition: A | Discharge status: Home / Self Care | Payer: PRIVATE HEALTH INSURANCE | Source: Ambulatory Visit | Attending: Orthopedic Surgery | Admitting: Orthopedic Surgery

## 2010-11-10 ENCOUNTER — Encounter (HOSPITAL_COMMUNITY): Payer: Self-pay | Admitting: *Deleted

## 2010-11-10 ENCOUNTER — Ambulatory Visit (HOSPITAL_COMMUNITY): Payer: PRIVATE HEALTH INSURANCE | Admitting: Anesthesiology

## 2010-11-10 ENCOUNTER — Encounter (HOSPITAL_COMMUNITY): Payer: Self-pay | Admitting: Anesthesiology

## 2010-11-10 DIAGNOSIS — Z01812 Encounter for preprocedural laboratory examination: Secondary | ICD-10-CM | POA: Insufficient documentation

## 2010-11-10 DIAGNOSIS — G5600 Carpal tunnel syndrome, unspecified upper limb: Secondary | ICD-10-CM

## 2010-11-10 DIAGNOSIS — Z01810 Encounter for preprocedural cardiovascular examination: Secondary | ICD-10-CM | POA: Insufficient documentation

## 2010-11-10 HISTORY — PX: CARPAL TUNNEL RELEASE: SHX101

## 2010-11-10 SURGERY — CARPAL TUNNEL RELEASE
Anesthesia: Regional | Site: Hand | Laterality: Right | Wound class: Clean

## 2010-11-10 MED ORDER — CEFAZOLIN SODIUM 1-5 GM-% IV SOLN
INTRAVENOUS | Status: DC | PRN
  Administered 2010-11-10: 1 g via INTRAVENOUS

## 2010-11-10 MED ORDER — MIDAZOLAM HCL 2 MG/2ML IJ SOLN
INTRAMUSCULAR | Status: AC
  Filled 2010-11-10: qty 2

## 2010-11-10 MED ORDER — LACTATED RINGERS IV SOLN
INTRAVENOUS | Status: DC | PRN
  Administered 2010-11-10: 07:00:00 via INTRAVENOUS

## 2010-11-10 MED ORDER — MIDAZOLAM HCL 2 MG/2ML IJ SOLN
1.0000 mg | INTRAMUSCULAR | Status: DC | PRN
  Administered 2010-11-10 (×2): 2 mg via INTRAVENOUS

## 2010-11-10 MED ORDER — PROPOFOL 10 MG/ML IV EMUL
INTRAVENOUS | Status: DC | PRN
  Administered 2010-11-10: 50 ug/kg/min via INTRAVENOUS

## 2010-11-10 MED ORDER — HYDROMORPHONE HCL 4 MG PO TABS: 4.0000 mg | ORAL_TABLET | ORAL | Status: AC | PRN

## 2010-11-10 MED ORDER — ONDANSETRON HCL 4 MG/2ML IJ SOLN: 4.0000 mg | Freq: Once | INTRAMUSCULAR | Status: AC | PRN

## 2010-11-10 MED ORDER — CEFAZOLIN SODIUM 1-5 GM-% IV SOLN: 1.0000 g | Freq: Once | INTRAVENOUS | Status: DC

## 2010-11-10 MED ORDER — BUPIVACAINE HCL (PF) 0.5 % IJ SOLN
INTRAMUSCULAR | Status: AC
  Filled 2010-11-10: qty 30

## 2010-11-10 MED ORDER — PROMETHAZINE HCL 25 MG PO TABS: 25.0000 mg | ORAL_TABLET | Freq: Four times a day (QID) | ORAL | Status: DC | PRN

## 2010-11-10 MED ORDER — PROPOFOL 10 MG/ML IV EMUL
INTRAVENOUS | Status: AC
  Filled 2010-11-10: qty 20

## 2010-11-10 MED ORDER — BUPIVACAINE HCL (PF) 0.5 % IJ SOLN
INTRAMUSCULAR | Status: DC | PRN
  Administered 2010-11-10: 10 mL

## 2010-11-10 MED ORDER — SODIUM CHLORIDE 0.9 % IJ SOLN
INTRAMUSCULAR | Status: AC
  Filled 2010-11-10: qty 10

## 2010-11-10 MED ORDER — FENTANYL CITRATE 0.05 MG/ML IJ SOLN: 25.0000 ug | INTRAMUSCULAR | Status: DC | PRN

## 2010-11-10 MED ORDER — FENTANYL CITRATE 0.05 MG/ML IJ SOLN
INTRAMUSCULAR | Status: DC | PRN
  Administered 2010-11-10 (×2): 50 ug via INTRAVENOUS

## 2010-11-10 MED ORDER — CEFAZOLIN SODIUM 1-5 GM-% IV SOLN
INTRAVENOUS | Status: AC
  Filled 2010-11-10: qty 50

## 2010-11-10 MED ORDER — SODIUM CHLORIDE 0.9 % IR SOLN
Status: DC | PRN
  Administered 2010-11-10: 1000 mL

## 2010-11-10 MED ORDER — HYDROCODONE-ACETAMINOPHEN 5-325 MG PO TABS: 1.0000 | ORAL_TABLET | Freq: Once | ORAL | Status: DC

## 2010-11-10 MED ORDER — HYDROCODONE-ACETAMINOPHEN 5-325 MG PO TABS
ORAL_TABLET | ORAL | Status: AC
  Filled 2010-11-10: qty 1

## 2010-11-10 MED ORDER — FENTANYL CITRATE 0.05 MG/ML IJ SOLN
INTRAMUSCULAR | Status: AC
  Filled 2010-11-10: qty 2

## 2010-11-10 MED ORDER — SODIUM CHLORIDE 0.9 % IV SOLN: INTRAVENOUS | Status: DC

## 2010-11-10 MED ORDER — LACTATED RINGERS IV SOLN
INTRAVENOUS | Status: DC
  Administered 2010-11-10: 07:00:00 via INTRAVENOUS
  Filled 2010-11-10: qty 1000

## 2010-11-10 MED ORDER — LIDOCAINE HCL (PF) 1 % IJ SOLN
INTRAMUSCULAR | Status: AC
  Filled 2010-11-10: qty 5

## 2010-11-10 MED ORDER — LIDOCAINE HCL (PF) 0.5 % IJ SOLN
INTRAMUSCULAR | Status: AC
  Filled 2010-11-10: qty 50

## 2010-11-10 SURGICAL SUPPLY — 39 items
BAG HAMPER (MISCELLANEOUS) ×2 IMPLANT
BANDAGE ELASTIC 2 VELCRO NS LF (GAUZE/BANDAGES/DRESSINGS) ×2 IMPLANT
BANDAGE ESMARK 4X12 BL STRL LF (DISPOSABLE) ×1 IMPLANT
BLADE SURG 15 STRL LF DISP TIS (BLADE) IMPLANT
BLADE SURG 15 STRL SS (BLADE) ×2
BNDG CMPR 12X4 ELC STRL LF (DISPOSABLE) ×1
BNDG COHESIVE 4X5 TAN NS LF (GAUZE/BANDAGES/DRESSINGS) ×2 IMPLANT
BNDG ESMARK 4X12 BLUE STRL LF (DISPOSABLE) ×2
CHLORAPREP W/TINT 26ML (MISCELLANEOUS) ×2 IMPLANT
CLOTH BEACON ORANGE TIMEOUT ST (SAFETY) ×2 IMPLANT
COVER LIGHT HANDLE STERIS (MISCELLANEOUS) ×4 IMPLANT
CUFF TOURNIQUET SINGLE 18IN (TOURNIQUET CUFF) ×2 IMPLANT
DECANTER SPIKE VIAL GLASS SM (MISCELLANEOUS) ×2 IMPLANT
DRSG XEROFORM 1X8 (GAUZE/BANDAGES/DRESSINGS) ×1 IMPLANT
ELECT NDL TIP 2.8 STRL (NEEDLE) ×1 IMPLANT
ELECT NEEDLE TIP 2.8 STRL (NEEDLE) ×2 IMPLANT
ELECT REM PT RETURN 9FT ADLT (ELECTROSURGICAL) ×2
ELECTRODE REM PT RTRN 9FT ADLT (ELECTROSURGICAL) ×1 IMPLANT
GLOVE BIOGEL PI IND STRL 7.5 (GLOVE) IMPLANT
GLOVE BIOGEL PI INDICATOR 7.5 (GLOVE) ×1
GLOVE ECLIPSE 7.0 STRL STRAW (GLOVE) ×1 IMPLANT
GLOVE SKINSENSE NS SZ8.0 LF (GLOVE) ×1
GLOVE SKINSENSE STRL SZ8.0 LF (GLOVE) ×1 IMPLANT
GLOVE SS N UNI LF 8.5 STRL (GLOVE) ×2 IMPLANT
GOWN BRE IMP SLV AUR XL STRL (GOWN DISPOSABLE) ×2 IMPLANT
GOWN STRL REIN XL XLG (GOWN DISPOSABLE) ×2 IMPLANT
HAND ALUMI XLG (SOFTGOODS) ×2 IMPLANT
KIT ROOM TURNOVER APOR (KITS) ×2 IMPLANT
MANIFOLD NEPTUNE II (INSTRUMENTS) ×2 IMPLANT
NDL HYPO 21X1.5 SAFETY (NEEDLE) ×1 IMPLANT
NEEDLE HYPO 21X1.5 SAFETY (NEEDLE) ×2 IMPLANT
NS IRRIG 1000ML POUR BTL (IV SOLUTION) ×2 IMPLANT
PACK BASIC LIMB (CUSTOM PROCEDURE TRAY) ×2 IMPLANT
PAD ARMBOARD 7.5X6 YLW CONV (MISCELLANEOUS) ×2 IMPLANT
SET BASIN LINEN APH (SET/KITS/TRAYS/PACK) ×2 IMPLANT
SPONGE GAUZE 4X4 12PLY (GAUZE/BANDAGES/DRESSINGS) ×1 IMPLANT
SUT ETHILON 3 0 FSL (SUTURE) ×3 IMPLANT
SYR CONTROL 10ML LL (SYRINGE) ×2 IMPLANT
TOWEL OR 17X26 4PK STRL BLUE (TOWEL DISPOSABLE) ×1 IMPLANT

## 2010-11-10 NOTE — Brief Op Note (Signed)
11/10/2010  8:25 AM  PATIENT:  [name]  40 y.o. female  PRE-OPERATIVE DIAGNOSIS:  carpal tunnel syndrome right hand  POST-OPERATIVE DIAGNOSIS:  carpal tunnel syndrome right hand  PROCEDURE:  Procedure(s): CARPAL TUNNEL RELEASE  SURGEON:  Surgeon(s): Fuller Canada, MD  PHYSICIAN ASSISTANT:   ASSISTANTS: none   ANESTHESIA:   regional  ESTIMATED BLOOD LOSS: * No blood loss amount entered *   BLOOD ADMINISTERED:none  DRAINS: none   LOCAL MEDICATIONS USED:  MARCAINE 10CC  SPECIMEN:  No Specimen  DISPOSITION OF SPECIMEN:  N/A  COUNTS:  YES  TOURNIQUET:   Total Tourniquet Time Documented: Upper Arm (Right) - 27 minutes  DICTATION #:   PLAN OF CARE: going home   PATIENT DISPOSITION:  PACU - hemodynamically stable.   Delay start of Pharmacological VTE agent (>24hrs) due to surgical blood loss or risk of bleeding:  not applicable

## 2010-11-10 NOTE — H&P (Signed)
H/P 7.18.2012 Referring practice [facility]  RIGHT wrist pain RIGHT hand numbness  Symptoms since April of 2012 but off and on for years things seem to have gotten worse. LEFT hand also onset was gradual.  No injury  No treatment other than a wrist brace  Quality/throbbing, stabbing  Intensity 9/10  Better with ice and bracing  Worse with use and type  Other symptoms or associated symptoms numbness and tingling  Review of systems all were reviewed positive findings for heartburn numbness tingling joint pain swelling and muscle pain  History as recorded above  General: The patient is normally developed, with normal grooming and hygiene. There are no gross deformities. The body habitus is normal  CDV: The pulse and perfusion of the extremities are normal  LYMPH: There is no gross lymphadenopathy in the extremities  Skin: There are no rashes, ulcers or cafe-au-lait spot  Psyche: The patient is alert, awake and oriented.  Mood is normal  Neuro:  The coordination and balance are normal.  Sensation is normal.  Reflexes are 2+ and equal  Musculoskeletal  RIGHT hand exam tenderness is noted in the forearm and over the carpal tunnel but no numbness or tingling is noted in the hand itself specifically the thumb index and ring finger. Grip strength seems little bit weak. Wrist range of motion is normal wrist is stable no ganglion cysts are noted.  Spurling test negative. Elbow range of motion normal. Forward elevation of the RIGHT shoulder normal for negative impingement signs.  Impression atypical neuralgia RIGHT upper extremity possible carpal tunnel syndrome  NCS was positive   Plan RT Carpal Tunnel Release   Vickki Hearing, MD, Montez Hageman. [facility]

## 2010-11-10 NOTE — Anesthesia Preprocedure Evaluation (Addendum)
Anesthesia Evaluation  Name, MR# and DOB Patient awake  General Assessment Comment  Reviewed: Allergy & Precautions, H&P  and Patient's Chart, lab work & pertinent test results  History of Anesthesia Complications Negative for: history of anesthetic complications (denies PONV)  Airway Mallampati: II TM Distance: >3 FB Neck ROM: Full  Mouth opening: Limited Mouth Opening  Dental  (+) Teeth Intact   Pulmonary    pulmonary exam normal   Cardiovascular Regular Normal   Neuro/Psych Neuromuscular disease (chronic LBP & sciatica)  GI/Hepatic/Renal   Endo/Other   Abdominal   Musculoskeletal  Hematology   Peds  Reproductive/Obstetrics   Anesthesia Other Findings             Anesthesia Physical Anesthesia Plan  ASA: II  Anesthesia Plan: Bier Block   Post-op Pain Management:    Induction:   Airway Management Planned: Nasal Cannula  Additional Equipment:   Intra-op Plan:   Post-operative Plan:   Informed Consent: I have reviewed the patients History and Physical, chart, labs and discussed the procedure including the risks, benefits and alternatives for the proposed anesthesia with the patient or authorized representative who has indicated his/her understanding and acceptance.     Plan Discussed with:   Anesthesia Plan Comments: (Propofol infusion for sedation)        Anesthesia Quick Evaluation

## 2010-11-10 NOTE — Brief Op Note (Signed)
11/10/2010  8:30 AM  PATIENT:  [name]  40 y.o. female  PRE-OPERATIVE DIAGNOSIS:  carpal tunnel syndrome right hand  POST-OPERATIVE DIAGNOSIS:  carpal tunnel syndrome right hand  PROCEDURE:  Procedure(s): CARPAL TUNNEL RELEASE  SURGEON:  Surgeon(s): Fuller Canada, MD  PHYSICIAN ASSISTANT:   ASSISTANTS: no   ANESTHESIA:   regional  ESTIMATED BLOOD LOSS: * No blood loss amount entered *   BLOOD ADMINISTERED:none  DRAINS: none   LOCAL MEDICATIONS USED:  MARCAINE 10CC  SPECIMEN:  No Specimen  DISPOSITION OF SPECIMEN:  N/A  COUNTS:  YES  TOURNIQUET:   Total Tourniquet Time Documented: Upper Arm (Right) - 27 minutes  DICTATION #  PLAN OF CARE:  PATIENT DISPOSITION:  PACU - hemodynamically stable.   Delay start of Pharmacological VTE agent (>24hrs) due to surgical blood loss or risk of bleeding:  not applicable

## 2010-11-10 NOTE — Anesthesia Procedure Notes (Signed)
Regional Block:  Bier block (IV Regional)  Pre-Anesthetic Checklist: ,, timeout performed, Correct Patient, Correct Site, Correct Laterality, Correct Procedure, Correct Position, site marked, Risks and benefits discussed, Surgical consent,  Pre-op evaluation,  At surgeon's request  Laterality: Right     Needles:  Injection technique: Single-shot      Additional Needles: Bier block (IV Regional) Narrative:  Start time: 11/10/2010 7:52 AM Resident/CRNA: Hosam Mcfetridge,CRNA  Additional Notes: #22 Gauge iv in right hand.  Rt arm exsanguinated, tourniquet up on Rt arm at 0752.  Lidocaine .5%, 50cc injected in Rt hand saline lock.

## 2010-11-10 NOTE — Op Note (Signed)
Operative note RIGHT carpal tunnel release date of surgery November 10, 2010.  Preop diagnosis carpal tunnel syndrome RIGHT wrist Postop diagnosis carpal tunnel syndrome RIGHT wrist Procedure open RIGHT carpal tunnel release Surgeon Romeo Apple Anesthetic regional Bier block Operative findings stenotic carpal tunnel Details of procedure: The patient was identified in the preop holding area and the surgical site was marked encounter signed.  The history and physical update was completed.  Antibiotics were ordered and the patient was given antibiotics and taken to surgery.  In the surgical suite a Bier block was done and then the arm was prepped and draped in sterile technique.  At this point the timeout procedure was initiated and completed.  The incision was made over the carpal tunnel and extended to the palmar fascia.  The palmar fascia was divided bluntly and dissection was carried out to the distal aspect of the transverse carpal ligament.  Blunt dissection was carried out beneath the ligament and sharp dissection was used to release the ligament.  The contents of the carpal tunnel were inspected and found to be free of any other pathology.  Irrigation was performed.  Closure was with interrupted 3-0 nylon suture.  10 cc of plain Sensorcaine was injected around the incision edges.  A sterile bandage was applied.  The tourniquet was released the color of the fingers was normal and the capillary refill was normal.  The patient is transferred to Recovery area and will be discharged home.

## 2010-11-10 NOTE — Interval H&P Note (Signed)
History and Physical Interval Note:   11/10/2010   7:20 AM   [name]  has presented today for surgery, with the diagnosis of carpal tunnel syndrome right hand  The various methods of treatment have been discussed with the patient and family. After consideration of risks, benefits and other options for treatment, the patient has consented to  Procedure(s): CARPAL TUNNEL RELEASE as a surgical intervention .  I have reviewed the patients' chart and labs.  Questions were answered to the patient's satisfaction.     Fuller Canada  MD   The patient has been checked today and there is no change in their condition

## 2010-11-10 NOTE — Anesthesia Postprocedure Evaluation (Signed)
  Anesthesia Post-op Note  Patient: [name]  Procedure(s) Performed:  CARPAL TUNNEL RELEASE  Patient Location: PACU and Short Stay  Anesthesia Type: MAC  Level of Consciousness: awake, alert  and oriented  Airway and Oxygen Therapy: Patient Spontanous Breathing  Post-op Pain: mild  Post-op Assessment: Post-op Vital signs reviewed, Patient's Cardiovascular Status Stable and Respiratory Function Stable  Post-op Vital Signs: stable  Complications: No apparent anesthesia complications

## 2010-11-10 NOTE — Addendum Note (Signed)
Addendum  created 11/10/10 0856 by Glynn Octave   Modules edited:Charges VN

## 2010-11-10 NOTE — Transfer of Care (Signed)
Immediate Anesthesia Transfer of Care Note  Patient: [name]  Procedure(s) Performed:  CARPAL TUNNEL RELEASE  Patient Location: PACU and Short Stay  Anesthesia Type: Bier block  Level of Consciousness: awake, alert  and oriented  Airway & Oxygen Therapy: Patient Spontanous Breathing  Post-op Assessment: Report given to PACU RN  Post vital signs: stable  Complications: No apparent anesthesia complications

## 2010-11-15 ENCOUNTER — Ambulatory Visit (INDEPENDENT_AMBULATORY_CARE_PROVIDER_SITE_OTHER): Payer: PRIVATE HEALTH INSURANCE | Admitting: Orthopedic Surgery

## 2010-11-15 DIAGNOSIS — Z9889 Other specified postprocedural states: Secondary | ICD-10-CM

## 2010-11-15 DIAGNOSIS — G5600 Carpal tunnel syndrome, unspecified upper limb: Secondary | ICD-10-CM

## 2010-11-15 NOTE — Progress Notes (Signed)
Postoperative visit  Procedure: (RIGHT  CTR)  Date of procedure July 18  Diagnosis Carpal Tunnel Syndrome   Operative findings Median Nerve compression  Appearance of incision Clean and dry  ROM Improving  Patient complaints Numbness small finger none in the thumb, index, long or ring finger  Plan Return for suture removal in 8 days

## 2010-11-15 NOTE — Patient Instructions (Addendum)
Keep clean and dry   Shower ok  Move fingers   Return in 8 days for suture removal

## 2010-11-16 ENCOUNTER — Telehealth: Payer: Self-pay | Admitting: Orthopedic Surgery

## 2010-11-16 NOTE — Telephone Encounter (Signed)
Patient called today (had appointment here yesterday, post op 1), states that she tried to refill her Hydromorphin at [facility] and was told she needs a "hard script", and that they cannot fax a request for this.  Her ph# is [phone]. Patient is due in 1 week for follow up.

## 2010-11-16 NOTE — Telephone Encounter (Signed)
forwarded

## 2010-11-16 NOTE — Telephone Encounter (Signed)
What?

## 2010-11-16 NOTE — Op Note (Signed)
NAMELERA, GAINES NO.:  [id number]  MEDICAL RECORD NO.:  [id number]  LOCATION:  [facility]                          FACILITY:  [facility]  PHYSICIAN:  Vickki Hearing, M.D.DATE OF BIRTH:  1970/11/26  DATE OF PROCEDURE:  11/10/2010 DATE OF DISCHARGE:  11/10/2010                              OPERATIVE REPORT   PREOPERATIVE DIAGNOSIS:  Carpal tunnel syndrome, right hand.  POSTOPERATIVE DIAGNOSIS:  Carpal tunnel syndrome, right hand.  PROCEDURE:  Open right carpal tunnel release.  SURGEON:  Vickki Hearing, MD  No assistants.  ANESTHESIA:  Regional.  10 mL Marcaine was used postop.  No blood loss.  No drains.  No specimens.  After site marking, the patient was taken to the operating room, given preoperative antibiotics.  She had a Bier block.  After successful Bier block, time-out procedure was executed.  After sterile prep and drape, an incision was made over the carpal tunnel.  Subcutaneous tissue was divided sharply.  The distal aspect of the transverse carpal ligament was exposed.  Blunt dissection was carried out beneath the ligament.  The nerve was protected and ligament was released.  The carpal tunnel contents were inspected, found to be normal except for compression of the nerve.  Wound was irrigated and closed with 3-0 nylon suture, dressed sterilely. Tourniquet was released.  Fingers had good viability.  Sterile dressing was applied.  The patient was taken to recovery room in stable condition.     Vickki Hearing, M.D.     SEH/MEDQ  D:  11/15/2010  T:  11/16/2010  Job:  [id number]

## 2010-11-17 ENCOUNTER — Other Ambulatory Visit: Payer: Self-pay | Admitting: Orthopedic Surgery

## 2010-11-17 DIAGNOSIS — G5600 Carpal tunnel syndrome, unspecified upper limb: Secondary | ICD-10-CM

## 2010-11-17 MED ORDER — HYDROMORPHONE HCL 4 MG PO TABS: 4.0000 mg | ORAL_TABLET | Freq: Two times a day (BID) | ORAL | Status: AC | PRN

## 2010-11-17 NOTE — Telephone Encounter (Signed)
Taken care of

## 2010-11-19 ENCOUNTER — Encounter (HOSPITAL_COMMUNITY): Payer: Self-pay | Admitting: Orthopedic Surgery

## 2010-11-23 ENCOUNTER — Encounter: Payer: Self-pay | Admitting: Orthopedic Surgery

## 2010-11-23 ENCOUNTER — Ambulatory Visit: Payer: PRIVATE HEALTH INSURANCE | Admitting: Orthopedic Surgery

## 2010-11-23 ENCOUNTER — Ambulatory Visit (INDEPENDENT_AMBULATORY_CARE_PROVIDER_SITE_OTHER): Payer: PRIVATE HEALTH INSURANCE | Admitting: Orthopedic Surgery

## 2010-11-23 DIAGNOSIS — Z9889 Other specified postprocedural states: Secondary | ICD-10-CM

## 2010-11-23 NOTE — Patient Instructions (Signed)
Keep clean   Shower ok   Move fingers try to make a complete fist   Return in 4 weeks

## 2010-11-23 NOTE — Progress Notes (Signed)
Sutures out   Wound clean   C/o numbness in the small finger   And soreness over the wrist   Come back in 4 weeks

## 2010-11-25 ENCOUNTER — Ambulatory Visit: Payer: PRIVATE HEALTH INSURANCE | Admitting: Orthopedic Surgery

## 2010-12-02 ENCOUNTER — Telehealth: Payer: Self-pay | Admitting: Orthopedic Surgery

## 2010-12-02 NOTE — Telephone Encounter (Signed)
Patient called to inquire about physical therapy for her tightness in wrist.  She is status/post carpal tunnel surgery.  Her next scheduled appointment is 12/21/10.  Please advise.  Patient ph# is [phone]

## 2010-12-03 ENCOUNTER — Other Ambulatory Visit: Payer: Self-pay | Admitting: Orthopedic Surgery

## 2010-12-03 DIAGNOSIS — Z9889 Other specified postprocedural states: Secondary | ICD-10-CM

## 2010-12-03 NOTE — Telephone Encounter (Signed)
OT for carpal tunnel

## 2010-12-04 NOTE — Telephone Encounter (Signed)
Ok to order OT for carpal tunnel

## 2010-12-21 ENCOUNTER — Ambulatory Visit (INDEPENDENT_AMBULATORY_CARE_PROVIDER_SITE_OTHER): Payer: PRIVATE HEALTH INSURANCE | Admitting: Orthopedic Surgery

## 2010-12-21 DIAGNOSIS — G5600 Carpal tunnel syndrome, unspecified upper limb: Secondary | ICD-10-CM

## 2010-12-21 NOTE — Progress Notes (Signed)
Visit status post carpal tunnel release, RIGHT upper extremity.  Patient reports all of her numbness has resolved. She is having some pulling sensation that the incision, which has healed nicely.  She can make a full fist. He has normal sensation to soft touch.  No swelling.  She is released from care

## 2011-01-13 LAB — CBC
HCT: 39.9
MCV: 82.6
Platelets: 383
RBC: 4.84
WBC: 10.4
WBC: 10.2

## 2011-01-13 LAB — BASIC METABOLIC PANEL
Chloride: 106
GFR calc Af Amer: >60
Potassium: 3.9

## 2011-01-13 LAB — PREGNANCY, URINE: Preg Test, Ur: NEGATIVE

## 2011-06-03 ENCOUNTER — Encounter (HOSPITAL_COMMUNITY): Payer: Self-pay

## 2011-06-03 ENCOUNTER — Emergency Department (HOSPITAL_COMMUNITY): Payer: BC Managed Care – PPO

## 2011-06-03 ENCOUNTER — Emergency Department (HOSPITAL_COMMUNITY)
Admission: EM | Admit: 2011-06-03 | Discharge: 2011-06-03 | Disposition: A | Discharge status: Home / Self Care | Payer: BC Managed Care – PPO | Attending: Emergency Medicine | Admitting: Emergency Medicine

## 2011-06-03 DIAGNOSIS — M25439 Effusion, unspecified wrist: Secondary | ICD-10-CM | POA: Insufficient documentation

## 2011-06-03 DIAGNOSIS — R209 Unspecified disturbances of skin sensation: Secondary | ICD-10-CM | POA: Insufficient documentation

## 2011-06-03 DIAGNOSIS — R609 Edema, unspecified: Secondary | ICD-10-CM | POA: Insufficient documentation

## 2011-06-03 DIAGNOSIS — M25539 Pain in unspecified wrist: Secondary | ICD-10-CM | POA: Insufficient documentation

## 2011-06-03 MED ORDER — OXYCODONE-ACETAMINOPHEN 5-325 MG PO TABS
1.0000 | ORAL_TABLET | Freq: Once | ORAL | Status: AC
  Administered 2011-06-03: 1 via ORAL
  Filled 2011-06-03: qty 1

## 2011-06-03 MED ORDER — ONDANSETRON 8 MG PO TBDP
8.0000 mg | ORAL_TABLET | Freq: Once | ORAL | Status: AC
  Administered 2011-06-03: 8 mg via ORAL
  Filled 2011-06-03: qty 1

## 2011-06-03 MED ORDER — NAPROXEN 500 MG PO TABS: 500.0000 mg | ORAL_TABLET | Freq: Two times a day (BID) | ORAL | Status: AC

## 2011-06-03 MED ORDER — OXYCODONE-ACETAMINOPHEN 5-325 MG PO TABS: 1.0000 | ORAL_TABLET | ORAL | Status: DC | PRN

## 2011-06-03 MED ORDER — ONDANSETRON HCL 4 MG PO TABS: 4.0000 mg | ORAL_TABLET | Freq: Four times a day (QID) | ORAL | Status: DC

## 2011-06-03 NOTE — ED Notes (Signed)
Pt presented to the ED with right wrist pain.

## 2011-06-03 NOTE — ED Provider Notes (Signed)
History     CSN: [id number]  Arrival date & time 06/03/11  1722   First MD Initiated Contact with Patient 06/03/11 1734      Chief Complaint  Patient presents with  . Wrist Pain    (Consider location/radiation/quality/duration/timing/severity/associated sxs/prior treatment) HPI Comments: Patient complains of pain and swelling to her right wrist for 4 days. She has a history of previous carpal, repair in July of 2012. She denies previous problems with her wrist since surgery until 4 days ago. She denies known injury. She is right-hand dominant. Pain is worse with movement of her fingers or flexion of her wrist. She also describes a" numbness and tingling sensation" to her right hand.  She has been wearing a wrist brace and taking over-the-counter ibuprofen without improvement of pain.  She states the pain radiates to her elbow but denies pain in the elbow.  Her carpal tunnel release was performed by Dr. Romeo Apple. She has an appointment with him next week.  Patient is a 41 y.o. female presenting with wrist pain. The history is provided by the patient.  Wrist Pain This is a new problem. The current episode started in the past 7 days. The problem occurs constantly. The problem has been unchanged. Associated symptoms include arthralgias, joint swelling and numbness. Pertinent negatives include no chest pain, fever, nausea, neck pain, rash, vomiting or weakness. Associated symptoms comments: Tingling to her fingers of the right hand. The symptoms are aggravated by twisting (movement and palpation). She has tried NSAIDs for the symptoms. The treatment provided moderate relief.    Past Medical History  Diagnosis Date  . Sciatica   . Carpal tunnel syndrome, bilateral   . PONV (postoperative nausea and vomiting)     Past Surgical History  Procedure Date  . Right knee arthroscopy   . Knee surgery   . Tubal ligation   . Ablasion of uterus   . Cholecystectomy     [facility]  . Carpal tunnel release  11/10/2010    Procedure: CARPAL TUNNEL RELEASE;  Surgeon: Fuller Canada, MD;  Location: [facility];  Service: Orthopedics;  Laterality: Right;    Family History  Problem Relation Age of Onset  . Cancer    . Diabetes    . Arthritis    . Asthma    . Diabetes type I Mother   . Hyperlipidemia Mother   . Diabetes type II Father   . Hypertension Father   . Hyperlipidemia Father   . Diabetes type II Brother   . Anesthesia problems Neg Hx   . Diabetes type II Brother     History  Substance Use Topics  . Smoking status: Current Everyday Smoker -- 1.0 packs/day for 26 years    Types: Cigarettes  . Smokeless tobacco: Never Used  . Alcohol Use: No    OB History    Grav Para Term Preterm Abortions TAB SAB Ect Mult Living                  Review of Systems  Constitutional: Negative for fever.  HENT: Negative for neck pain.   Cardiovascular: Negative for chest pain.  Gastrointestinal: Negative for nausea and vomiting.  Musculoskeletal: Positive for joint swelling and arthralgias.  Skin: Negative for rash.  Neurological: Positive for numbness. Negative for dizziness and weakness.  All other systems reviewed and are negative.    Allergies  Hydrocodone  Home Medications   Current Outpatient Rx  Name Route Sig Dispense Refill  . IBUPROFEN 200 MG  PO TABS Oral Take 200 mg by mouth daily as needed. For pain       BP 140/74  Pulse 78  Temp(Src) 98.1 F (36.7 C) (Oral)  Resp 18  Ht 5\' 4"  (1.626 m)  Wt 230 lb (104.327 kg)  BMI 39.48 kg/m2  SpO2 100%  Physical Exam  Nursing note and vitals reviewed. Constitutional: She is oriented to person, place, and time. She appears well-developed and well-nourished. No distress.  HENT:  Head: Normocephalic and atraumatic.  Neck: Neck supple.  Cardiovascular: Normal rate, regular rhythm and normal heart sounds.   Pulmonary/Chest: Effort normal and breath sounds normal. No respiratory distress. She exhibits no tenderness.    Musculoskeletal: She exhibits edema and tenderness.       Right wrist: She exhibits decreased range of motion, tenderness, bony tenderness and swelling. She exhibits no effusion, no crepitus, no deformity and no laceration.       Arms:      diffuse ttp of the right wrist.  Mild STS present.  Radial pulse is brisk.  Sensation intact.  CR<2 sec  Neurological: She is alert and oriented to person, place, and time. No cranial nerve deficit. She exhibits normal muscle tone. Coordination normal.  Skin: Skin is warm and dry.    ED Course  Procedures (including critical care time)    Dg Wrist Complete Right  06/03/2011  *RADIOLOGY REPORT*  Clinical Data: Right wrist pain.  RIGHT WRIST - COMPLETE 3+ VIEW  Comparison: None  Findings: The joint spaces are maintained.  No acute fracture.  No degenerative changes or osteochondral abnormalities.  IMPRESSION: No acute bony findings or degenerative changes.  Original Report Authenticated By: P. Loralie Champagne, M.D.     velcro wrist splint applied.  Pain improved.  NV intact.    MDM      Patient has diffuse tenderness to palpation of the right wrist. Mild to moderate soft tissue swelling. Radial pulse is brisk. Distal sensation intact Refill is less than 2 seconds. Patient has appointment on Tuesday with her orthopedist. She agrees to return here if symptoms worsen.  Will treat with pain medications and anti-inflammatory  Jerrian Mells L. [place], [place] 06/03/11 1824

## 2011-06-05 NOTE — ED Provider Notes (Signed)
Medical screening examination/treatment/procedure(s) were performed by non-physician practitioner and as supervising physician I was immediately available for consultation/collaboration.  Nicoletta Dress. Colon Branch, MD 06/05/11 2151

## 2011-06-07 ENCOUNTER — Encounter: Payer: Self-pay | Admitting: Orthopedic Surgery

## 2011-06-07 ENCOUNTER — Ambulatory Visit (INDEPENDENT_AMBULATORY_CARE_PROVIDER_SITE_OTHER): Payer: BC Managed Care – PPO | Admitting: Orthopedic Surgery

## 2011-06-07 VITALS — BP 130/70 | Ht 64.0 in | Wt 230.0 lb

## 2011-06-07 DIAGNOSIS — M79A9 Nontraumatic compartment syndrome of other sites: Secondary | ICD-10-CM

## 2011-06-07 MED ORDER — PREDNISONE 10 MG PO KIT: 10.0000 mg | PACK | ORAL | Status: DC

## 2011-06-07 MED ORDER — ONDANSETRON HCL 4 MG PO TABS: 4.0000 mg | ORAL_TABLET | ORAL | Status: AC | PRN

## 2011-06-07 MED ORDER — OXYCODONE-ACETAMINOPHEN 5-325 MG PO TABS: 1.0000 | ORAL_TABLET | ORAL | Status: AC | PRN

## 2011-06-07 NOTE — Patient Instructions (Signed)
OOW X 7 DAYS  

## 2011-06-08 ENCOUNTER — Encounter: Payer: Self-pay | Admitting: Orthopedic Surgery

## 2011-06-08 DIAGNOSIS — M79A9 Nontraumatic compartment syndrome of other sites: Secondary | ICD-10-CM | POA: Insufficient documentation

## 2011-06-08 NOTE — Progress Notes (Signed)
  Subjective:    [name] is an 41 y.o. female who presents for evaluation of her RIGHT forearm and hand.  She had carpal tunnel release several months ago did well.  Presents with a nine-day history of having increasing pain in the forearm and hand described as throbbing associated with loss of movement of her digits some mild tingling in the entire digit pain radiating up into the forearm pain with 10 out of 10 in intensity requiring emergency room visit  She was started on naproxen, Percocet and Zofran.  Her symptoms have improved she had a significant amount of swelling which is started to resolve.  Now most of the pain is on the ulnar and volar side of the wrist and forearm area  The following portions of the patient's history were reviewed and updated as appropriate: allergies, current medications, past family history, past medical history, past social history, past surgical history and problem list.  Review of Systems A comprehensive review of systems was negative except for: Musculoskeletal: positive for myalgias, stiff joints and swelling  Neurological: positive for paresthesia and weakness   Objective:    BP 130/70  Ht 5\' 4"  (1.626 m)  Wt 104.327 kg (230 lb)  BMI 39.48 kg/m2  Physical Exam(12) GENERAL: normal development   CDV: pulses are normal   Skin: normal  Lymph: nodes were not palpable/normal  Psychiatric: awake, alert and oriented   Right wrist:  And forearm exam.  There is no increased intensity or tension in the compartment at this time she does have her hands held in a flexed position at the interphalangeal joints and the wrist joint.  She has pain with extension of these digits.  She has normal pressure sensation in the digits with decreased soft touch sensation.  Good capillary refill and blood flow with good pulse radial and ulnar.  She has tenderness over the ulnar side of the forearm on the volar aspect consistent with the flexor ulnaris musculature and  also in the midportion of the forearm.  Passive range of motion of the wrist and elbow are normal but painful passive extension of the wrist.  Muscle tone is normal.  And stability test were normal.        Imaging: X-ray none  Assessment:    possible compartment syndrome is developing and has now started to resolve on the right side   Plan:    recommend splint, steroids, Zofran, Percocet, no work.  Splint the wrist in neutral position.

## 2011-06-14 ENCOUNTER — Ambulatory Visit: Payer: BC Managed Care – PPO | Admitting: Orthopedic Surgery

## 2011-06-14 ENCOUNTER — Encounter: Payer: Self-pay | Admitting: Orthopedic Surgery

## 2011-06-14 ENCOUNTER — Ambulatory Visit (INDEPENDENT_AMBULATORY_CARE_PROVIDER_SITE_OTHER): Payer: BC Managed Care – PPO | Admitting: Orthopedic Surgery

## 2011-06-14 VITALS — BP 124/70 | Ht 64.0 in | Wt 230.0 lb

## 2011-06-14 DIAGNOSIS — G5620 Lesion of ulnar nerve, unspecified upper limb: Secondary | ICD-10-CM | POA: Insufficient documentation

## 2011-06-14 NOTE — Progress Notes (Signed)
Subjective:     Patient ID: [name], female   DOB: 26-Dec-1970, 41 y.o.   MRN: [id number]  HPI Chief Complaint  Patient presents with  . Follow-up    1 week recheck on right forearm.     Uniform has gone down and the pain is much improved however the ulnar nerve innervated digits of the RIGHT upper extremity are and have been affected with decreased sensation.  She now can distinguish between her carpal tunnel symptoms and the symptoms.  The forearm is sore nontender there is no pain over the ulnar nerve at the elbow and the Tinel's is negative.  Soft tissues was compartments are soft.  There are no signs of triangular fibrocartilage tear and with negative compression test no tenderness no pain with clenched fist area no instability.  No swelling there.  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    Plan:     Carpal tunnel like splint x3 weeks.  Follow up 3 weeks

## 2011-06-14 NOTE — Patient Instructions (Addendum)
RTW: Thursday   Wear splint x 3 weeks

## 2011-06-16 ENCOUNTER — Other Ambulatory Visit: Payer: Self-pay | Admitting: *Deleted

## 2011-06-16 DIAGNOSIS — G5620 Lesion of ulnar nerve, unspecified upper limb: Secondary | ICD-10-CM

## 2011-07-06 ENCOUNTER — Ambulatory Visit: Payer: BC Managed Care – PPO | Admitting: Orthopedic Surgery

## 2012-02-09 ENCOUNTER — Other Ambulatory Visit: Payer: Self-pay | Admitting: Internal Medicine

## 2012-02-09 DIAGNOSIS — Z1231 Encounter for screening mammogram for malignant neoplasm of breast: Secondary | ICD-10-CM

## 2012-03-09 ENCOUNTER — Ambulatory Visit
Admission: RE | Admit: 2012-03-09 | Discharge: 2012-03-09 | Disposition: A | Discharge status: Home / Self Care | Payer: BC Managed Care – PPO | Source: Ambulatory Visit | Attending: Internal Medicine | Admitting: Internal Medicine

## 2012-03-09 DIAGNOSIS — Z1231 Encounter for screening mammogram for malignant neoplasm of breast: Secondary | ICD-10-CM

## 2012-06-23 ENCOUNTER — Encounter (HOSPITAL_COMMUNITY): Payer: Self-pay

## 2012-06-23 ENCOUNTER — Emergency Department (HOSPITAL_COMMUNITY)
Admission: EM | Admit: 2012-06-23 | Discharge: 2012-06-23 | Disposition: A | Discharge status: Home / Self Care | Payer: BC Managed Care – PPO | Attending: Emergency Medicine | Admitting: Emergency Medicine

## 2012-06-23 DIAGNOSIS — N949 Unspecified condition associated with female genital organs and menstrual cycle: Secondary | ICD-10-CM | POA: Insufficient documentation

## 2012-06-23 DIAGNOSIS — F17200 Nicotine dependence, unspecified, uncomplicated: Secondary | ICD-10-CM | POA: Insufficient documentation

## 2012-06-23 DIAGNOSIS — Z8739 Personal history of other diseases of the musculoskeletal system and connective tissue: Secondary | ICD-10-CM | POA: Insufficient documentation

## 2012-06-23 DIAGNOSIS — N751 Abscess of Bartholin's gland: Secondary | ICD-10-CM

## 2012-06-23 MED ORDER — OXYCODONE-ACETAMINOPHEN 5-325 MG PO TABS: 1.0000 | ORAL_TABLET | ORAL | Status: DC | PRN

## 2012-06-23 MED ORDER — LIDOCAINE HCL (PF) 2 % IJ SOLN
10.0000 mL | Freq: Once | INTRAMUSCULAR | Status: AC
  Administered 2012-06-23: 10 mL via INTRADERMAL

## 2012-06-23 MED ORDER — DOXYCYCLINE HYCLATE 100 MG PO CAPS: 100.0000 mg | ORAL_CAPSULE | Freq: Two times a day (BID) | ORAL | Status: DC

## 2012-06-23 MED ORDER — LIDOCAINE HCL (PF) 2 % IJ SOLN
INTRAMUSCULAR | Status: AC
  Administered 2012-06-23: 10 mL via INTRADERMAL
  Filled 2012-06-23: qty 10

## 2012-06-23 NOTE — ED Provider Notes (Signed)
Medical screening examination/treatment/procedure(s) were performed by non-physician practitioner and as supervising physician I was immediately available for consultation/collaboration.  Daundre Biel, MD 06/23/12 2305 

## 2012-06-23 NOTE — ED Notes (Signed)
Complain of ? Bump on vagina

## 2012-06-23 NOTE — ED Provider Notes (Signed)
History     CSN: [id number]  Arrival date & time 06/23/12  1544   First MD Initiated Contact with Patient 06/23/12 [phone]      Chief Complaint  Patient presents with  . Abscess    (Consider location/radiation/quality/duration/timing/severity/associated sxs/prior treatment) Patient is a 42 y.o. female presenting with abscess. The history is provided by the patient.  Abscess Abscess location: left labia. Abscess quality: fluctuance and painful   Red streaking: no   Duration:  3 days Progression:  Worsening Pain details:    Quality:  Pressure, throbbing and aching   Severity:  Moderate   Timing:  Constant   Progression:  Worsening Chronicity:  New Context: not diabetes, not immunosuppression, not insect bite/sting and not skin injury   Relieved by:  Nothing Worsened by:  Nothing tried Ineffective treatments:  None tried Associated symptoms: no fever, no nausea and no vomiting   Risk factors: prior abscess   Risk factors: no hx of MRSA     Past Medical History  Diagnosis Date  . Sciatica   . Carpal tunnel syndrome, bilateral   . PONV (postoperative nausea and vomiting)     Past Surgical History  Procedure Laterality Date  . Right knee arthroscopy    . Knee surgery    . Tubal ligation    . Ablasion of uterus    . Cholecystectomy      [facility]  . Carpal tunnel release  11/10/2010    Procedure: CARPAL TUNNEL RELEASE;  Surgeon: Fuller Canada, MD;  Location: [facility];  Service: Orthopedics;  Laterality: Right;    Family History  Problem Relation Age of Onset  . Cancer    . Diabetes    . Arthritis    . Asthma    . Diabetes type I Mother   . Hyperlipidemia Mother   . Diabetes type II Father   . Hypertension Father   . Hyperlipidemia Father   . Diabetes type II Brother   . Anesthesia problems Neg Hx   . Diabetes type II Brother     History  Substance Use Topics  . Smoking status: Current Every Day Smoker -- 1.00 packs/day for 26 years    Types: Cigarettes  .  Smokeless tobacco: Never Used  . Alcohol Use: No    OB History   Grav Para Term Preterm Abortions TAB SAB Ect Mult Living                  Review of Systems  Constitutional: Negative for fever, chills, activity change and appetite change.  HENT: Negative for neck pain.   Respiratory: Negative for chest tightness, shortness of breath and wheezing.   Cardiovascular: Negative for chest pain.  Gastrointestinal: Negative for nausea and vomiting.  Genitourinary: Positive for vaginal pain. Negative for dysuria, urgency, hematuria, flank pain, decreased urine volume, vaginal bleeding, vaginal discharge and menstrual problem.  Musculoskeletal: Negative for joint swelling and arthralgias.  Skin: Negative for color change and rash.  Hematological: Negative for adenopathy.  All other systems reviewed and are negative.    Allergies  Hydrocodone  Home Medications   Current Outpatient Rx  Name  Route  Sig  Dispense  Refill  . ibuprofen (ADVIL,MOTRIN) 200 MG tablet   Oral   Take 800 mg by mouth 2 (two) times daily as needed. For pain         . PredniSONE 10 MG KIT   Oral   Take 1 kit (10 mg total) by mouth as directed.  48 each   1     BP 138/86  Pulse 81  Temp(Src) 97.9 F (36.6 C) (Oral)  Resp 18  Ht 5\' 5"  (1.651 m)  Wt 205 lb (92.987 kg)  BMI 34.11 kg/m2  SpO2 100%  Physical Exam  Nursing note and vitals reviewed. Constitutional: She is oriented to person, place, and time. She appears well-developed and well-nourished. No distress.  HENT:  Head: Normocephalic and atraumatic.  Cardiovascular: Normal rate, regular rhythm, normal heart sounds and intact distal pulses.   No murmur heard. Pulmonary/Chest: Effort normal and breath sounds normal. No respiratory distress.  Abdominal: Soft. She exhibits no distension. There is no tenderness. There is no rebound.  Genitourinary:     Left bartholin's gland abscess, no drainage  Musculoskeletal: Normal range of motion.  She exhibits no edema and no tenderness.  Neurological: She is alert and oriented to person, place, and time. She exhibits normal muscle tone. Coordination normal.  Skin: Skin is warm and dry. She is not diaphoretic.    ED Course  Procedures (including critical care time)  Labs Reviewed - No data to display No results found.      MDM   INCISION AND DRAINAGE Performed by: Maxwell Caul. Consent: Verbal consent obtained. Risks and benefits: risks, benefits and alternatives were discussed Type: abscess  Body area: left Bartholin's gland  Anesthesia: local infiltration  Incision was made with a #11 scalpel.  Local anesthetic: lidocaine 2 % w/o epinephrine  Anesthetic total: 3 ml  Complexity: complex  Blunt dissection to break up loculations  Drainage: purulent  Drainage amount: moderate  Packing material: 1/4 in iodoform gauze  Patient tolerance: Patient tolerated the procedure well with no immediate complications.    Patient agrees to warm water soaks, packing removal in 2 days.  Will begin doxycycline and percocet #20 for pain.  Pt agrees to return here in 2-3 days if needed      Erilyn Pearman L. Tyri Elmore, [place] 06/23/12 1650

## 2012-06-23 NOTE — ED Notes (Signed)
Iva Lento, PA at bedside for procedure

## 2012-06-23 NOTE — ED Notes (Signed)
Pt noted a "knot" since Wednesday to vagina and swelling has increased along with pain, pt denies fever or N/V

## 2014-03-05 ENCOUNTER — Encounter (HOSPITAL_COMMUNITY): Payer: Self-pay | Admitting: *Deleted

## 2014-03-05 ENCOUNTER — Emergency Department (HOSPITAL_COMMUNITY): Payer: BC Managed Care – PPO

## 2014-03-05 ENCOUNTER — Emergency Department (HOSPITAL_COMMUNITY)
Admission: EM | Admit: 2014-03-05 | Discharge: 2014-03-05 | Disposition: A | Discharge status: Home / Self Care | Payer: BC Managed Care – PPO | Attending: Emergency Medicine | Admitting: Emergency Medicine

## 2014-03-05 DIAGNOSIS — Z9049 Acquired absence of other specified parts of digestive tract: Secondary | ICD-10-CM | POA: Insufficient documentation

## 2014-03-05 DIAGNOSIS — Z9851 Tubal ligation status: Secondary | ICD-10-CM | POA: Diagnosis not present

## 2014-03-05 DIAGNOSIS — Z792 Long term (current) use of antibiotics: Secondary | ICD-10-CM | POA: Diagnosis not present

## 2014-03-05 DIAGNOSIS — E669 Obesity, unspecified: Secondary | ICD-10-CM | POA: Diagnosis not present

## 2014-03-05 DIAGNOSIS — Z8739 Personal history of other diseases of the musculoskeletal system and connective tissue: Secondary | ICD-10-CM | POA: Diagnosis not present

## 2014-03-05 DIAGNOSIS — Z8669 Personal history of other diseases of the nervous system and sense organs: Secondary | ICD-10-CM | POA: Insufficient documentation

## 2014-03-05 DIAGNOSIS — R109 Unspecified abdominal pain: Secondary | ICD-10-CM | POA: Diagnosis not present

## 2014-03-05 DIAGNOSIS — Z79899 Other long term (current) drug therapy: Secondary | ICD-10-CM | POA: Insufficient documentation

## 2014-03-05 DIAGNOSIS — Z7952 Long term (current) use of systemic steroids: Secondary | ICD-10-CM | POA: Insufficient documentation

## 2014-03-05 DIAGNOSIS — R160 Hepatomegaly, not elsewhere classified: Secondary | ICD-10-CM | POA: Diagnosis not present

## 2014-03-05 DIAGNOSIS — Z720 Tobacco use: Secondary | ICD-10-CM | POA: Insufficient documentation

## 2014-03-05 LAB — CBC WITH DIFFERENTIAL/PLATELET
Basophils Absolute: 0.1 10*3/uL (ref 0.0–0.1)
Basophils Relative: 1 % (ref 0–1)
EOS PCT: 5 % (ref 0–5)
Eosinophils Absolute: 0.6 10*3/uL (ref 0.0–0.7)
HEMATOCRIT: 43.7 % (ref 36.0–46.0)
HEMOGLOBIN: 14.9 g/dL (ref 12.0–15.0)
LYMPHS PCT: 36 % (ref 12–46)
Lymphs Abs: 3.8 10*3/uL (ref 0.7–4.0)
MCH: 29.9 pg (ref 26.0–34.0)
MCHC: 34.1 g/dL (ref 30.0–36.0)
MCV: 87.6 fL (ref 78.0–100.0)
MONO ABS: 0.5 10*3/uL (ref 0.1–1.0)
MONOS PCT: 5 % (ref 3–12)
NEUTROS ABS: 5.6 10*3/uL (ref 1.7–7.7)
Neutrophils Relative %: 53 % (ref 43–77)
Platelets: 322 10*3/uL (ref 150–400)
RBC: 4.99 MIL/uL (ref 3.87–5.11)
RDW: 14.2 % (ref 11.5–15.5)
WBC: 10.6 10*3/uL — AB (ref 4.0–10.5)

## 2014-03-05 LAB — COMPREHENSIVE METABOLIC PANEL
ALT: 29 U/L (ref 0–35)
AST: 15 U/L (ref 0–37)
Albumin: 4 g/dL (ref 3.5–5.2)
Alkaline Phosphatase: 97 U/L (ref 39–117)
Anion gap: 14 (ref 5–15)
BILIRUBIN TOTAL: 0.3 mg/dL (ref 0.3–1.2)
BUN: 18 mg/dL (ref 6–23)
CHLORIDE: 102 meq/L (ref 96–112)
CO2: 23 meq/L (ref 19–32)
Calcium: 9.3 mg/dL (ref 8.4–10.5)
Creatinine, Ser: 0.6 mg/dL (ref 0.50–1.10)
GFR calc Af Amer: >90 mL/min (ref >90)
Glucose, Bld: 145 mg/dL — ABNORMAL HIGH (ref 70–99)
Potassium: 4.4 mEq/L (ref 3.7–5.3)
Sodium: 139 mEq/L (ref 137–147)
Total Protein: 7.4 g/dL (ref 6.0–8.3)

## 2014-03-05 LAB — URINALYSIS, ROUTINE W REFLEX MICROSCOPIC
BILIRUBIN URINE: NEGATIVE
Glucose, UA: NEGATIVE mg/dL
Ketones, ur: NEGATIVE mg/dL
Leukocytes, UA: NEGATIVE
NITRITE: NEGATIVE
PROTEIN: NEGATIVE mg/dL
UROBILINOGEN UA: 0.2 mg/dL (ref 0.0–1.0)
pH: 6 (ref 5.0–8.0)

## 2014-03-05 LAB — LIPASE, BLOOD: Lipase: 32 U/L (ref 11–59)

## 2014-03-05 LAB — URINE MICROSCOPIC-ADD ON

## 2014-03-05 MED ORDER — MORPHINE SULFATE 4 MG/ML IJ SOLN
4.0000 mg | Freq: Once | INTRAMUSCULAR | Status: AC
  Administered 2014-03-05: 4 mg via INTRAVENOUS
  Filled 2014-03-05: qty 1

## 2014-03-05 MED ORDER — TRAMADOL HCL 50 MG PO TABS: 100.0000 mg | ORAL_TABLET | Freq: Four times a day (QID) | ORAL | Status: DC | PRN

## 2014-03-05 MED ORDER — ONDANSETRON HCL 4 MG/2ML IJ SOLN
4.0000 mg | Freq: Once | INTRAMUSCULAR | Status: DC
  Filled 2014-03-05: qty 2

## 2014-03-05 MED ORDER — IOHEXOL 300 MG/ML  SOLN
25.0000 mL | Freq: Once | INTRAMUSCULAR | Status: AC | PRN
  Administered 2014-03-05: 25 mL via ORAL

## 2014-03-05 MED ORDER — IOHEXOL 300 MG/ML  SOLN
100.0000 mL | Freq: Once | INTRAMUSCULAR | Status: AC | PRN
  Administered 2014-03-05: 100 mL via INTRAVENOUS

## 2014-03-05 MED ORDER — SODIUM CHLORIDE 0.9 % IV BOLUS (SEPSIS)
1000.0000 mL | Freq: Once | INTRAVENOUS | Status: AC
  Administered 2014-03-05: 1000 mL via INTRAVENOUS

## 2014-03-05 MED ORDER — SODIUM CHLORIDE 0.9 % IJ SOLN
INTRAMUSCULAR | Status: AC
  Filled 2014-03-05: qty 15

## 2014-03-05 MED ORDER — ONDANSETRON HCL 4 MG PO TABS: 4.0000 mg | ORAL_TABLET | Freq: Three times a day (TID) | ORAL | Status: DC | PRN

## 2014-03-05 MED ORDER — OXYCODONE HCL 5 MG PO TABS: 5.0000 mg | ORAL_TABLET | Freq: Four times a day (QID) | ORAL | Status: DC | PRN

## 2014-03-05 MED ORDER — SODIUM CHLORIDE 0.9 % IJ SOLN
INTRAMUSCULAR | Status: AC
  Filled 2014-03-05: qty 250

## 2014-03-05 MED ORDER — SODIUM CHLORIDE 0.9 % IJ SOLN
INTRAMUSCULAR | Status: AC
  Filled 2014-03-05: qty 3

## 2014-03-05 MED ORDER — DTAP-HEPATITIS B RECOMB-IPV IM SUSP: 0.5000 mL | Freq: Once | INTRAMUSCULAR | Status: DC

## 2014-03-05 MED ORDER — ONDANSETRON HCL 4 MG/2ML IJ SOLN
4.0000 mg | Freq: Once | INTRAMUSCULAR | Status: AC
  Administered 2014-03-05: 4 mg via INTRAVENOUS

## 2014-03-05 NOTE — ED Notes (Signed)
Pt alert & oriented x4, stable gait. Patient given discharge instructions, paperwork & prescription(s). Patient  instructed to stop at the registration desk to finish any additional paperwork. Patient verbalized understanding. Pt left department w/ no further questions. 

## 2014-03-05 NOTE — ED Provider Notes (Signed)
CSN: [id number]     Arrival date & time 03/05/14  2482 History   First MD Initiated Contact with Patient 03/05/14 [phone]     Chief Complaint  Patient presents with  . Flank Pain     (Consider location/radiation/quality/duration/timing/severity/associated sxs/prior Treatment) HPI.... right lateral abdominal pain for 2 months, getting worse the past week and especially the past 2 days. Pain is now sharp and constant. This is never happened before. She is status post BTL and cholecystectomy. No dysuria, hematuria, fever, chills, chest pain, dyspnea. She has gained weight. She is a Equities trader.  She smokes cigarettes but does not drink alcohol. Nothing makes symptoms better or worse. Severity is moderate.  Past Medical History  Diagnosis Date  . Sciatica   . Carpal tunnel syndrome, bilateral   . PONV (postoperative nausea and vomiting)    Past Surgical History  Procedure Laterality Date  . Right knee arthroscopy    . Knee surgery    . Tubal ligation    . Ablasion of uterus    . Cholecystectomy      [facility]  . Carpal tunnel release  11/10/2010    Procedure: CARPAL TUNNEL RELEASE;  Surgeon: Arther Abbott, MD;  Location: [facility];  Service: Orthopedics;  Laterality: Right;   Family History  Problem Relation Age of Onset  . Cancer    . Diabetes    . Arthritis    . Asthma    . Diabetes type I Mother   . Hyperlipidemia Mother   . Diabetes type II Father   . Hypertension Father   . Hyperlipidemia Father   . Diabetes type II Brother   . Anesthesia problems Neg Hx   . Diabetes type II Brother    History  Substance Use Topics  . Smoking status: Current Every Day Smoker -- 0.00 packs/day for 26 years    Types: Cigarettes  . Smokeless tobacco: Never Used  . Alcohol Use: No   OB History    No data available     Review of Systems  All other systems reviewed and are negative.     Allergies  Hydrocodone  Home Medications   Prior to Admission medications   Medication Sig  Start Date End Date Taking? Authorizing Provider  doxycycline (VIBRAMYCIN) 100 MG capsule Take 1 capsule (100 mg total) by mouth 2 (two) times daily. For 10 days 06/23/12   Tammy L. Triplett, PA-C  ibuprofen (ADVIL,MOTRIN) 200 MG tablet Take 800 mg by mouth 2 (two) times daily as needed. For pain    Historical Provider, MD  oxyCODONE-acetaminophen (PERCOCET/ROXICET) 5-325 MG per tablet Take 1 tablet by mouth every 4 (four) hours as needed for pain. 06/23/12   Tammy L. Triplett, PA-C  PredniSONE 10 MG KIT Take 1 kit (10 mg total) by mouth as directed. 06/07/11   Carole Civil, MD   BP 150/85 mmHg  Pulse 85  Temp(Src) 97.7 F (36.5 C) (Oral)  Resp 20  Ht 5' 5" (1.651 m)  Wt 230 lb (104.327 kg)  BMI 38.27 kg/m2  SpO2 98% Physical Exam  Constitutional: She is oriented to person, place, and time. She appears well-developed and well-nourished.  obese  HENT:  Head: Normocephalic and atraumatic.  Eyes: Conjunctivae and EOM are normal. Pupils are equal, round, and reactive to light.  Neck: Normal range of motion. Neck supple.  Cardiovascular: Normal rate, regular rhythm and normal heart sounds.   Pulmonary/Chest: Effort normal and breath sounds normal.  Abdominal: Soft. Bowel sounds are  normal.  Tenderness right superior lateral abdominal wall  Musculoskeletal: Normal range of motion.  Neurological: She is alert and oriented to person, place, and time.  Skin: Skin is warm and dry.  Psychiatric: She has a normal mood and affect. Her behavior is normal.  Nursing note and vitals reviewed.   ED Course  Procedures (including critical care time) Labs Review Labs Reviewed  URINALYSIS, ROUTINE W REFLEX MICROSCOPIC - Abnormal; Notable for the following:    Specific Gravity, Urine >1.030 (*)    Hgb urine dipstick TRACE (*)    All other components within normal limits  URINE MICROSCOPIC-ADD ON - Abnormal; Notable for the following:    Squamous Epithelial / LPF MANY (*)    Bacteria, UA MANY (*)     All other components within normal limits  CBC WITH DIFFERENTIAL  LIPASE, BLOOD    Imaging Review No results found.   EKG Interpretation None      MDM   Final diagnoses:  Abdominal pain    Uncertain etiology of patient's pain. Will hydrate, treat pain, screening labs and urinalysis, CT abdomen and pelvis. Discussed with Dr. Brayton Mars, MD 03/05/14 [phone]

## 2014-03-05 NOTE — ED Provider Notes (Signed)
PT left at change of shift to get CT results.  Pt reports some mild relief of her pain at this time. Discussed results of CT Abdomen. Ordered liver labs. She says she does not drink alcohol, she does not take acetaminophen.  States she is unaware of being in contact with any Hepatitis patients, pt is a Equities trader. She denies having any jaundice or dark urine before.    09:01 Dr Laural Golden will have his office call patient for an appointment next week.    Diagnoses that have been ruled out:  None  Diagnoses that are still under consideration:  None  Final diagnoses:  Abdominal pain  Hepatomegaly    New Prescriptions   ONDANSETRON (ZOFRAN) 4 MG TABLET    Take 1 tablet (4 mg total) by mouth every 8 (eight) hours as needed for nausea or vomiting.   OXYCODONE (ROXICODONE) 5 MG IMMEDIATE RELEASE TABLET    Take 1 tablet (5 mg total) by mouth every 6 (six) hours as needed for severe pain.   TRAMADOL (ULTRAM) 50 MG TABLET    Take 2 tablets (100 mg total) by mouth every 6 (six) hours as needed for moderate pain.    Plan discharge  Rolland Porter, MD, Alanson Aly, MD 03/05/14 [phone]

## 2014-03-05 NOTE — ED Notes (Signed)
Pt reports having right sided pain, radiating into abdomen. Denies nausea or vomiting.  Reports pain for "past few days".

## 2014-03-05 NOTE — Discharge Instructions (Signed)
Dr Olevia Perches office will be calling you to get an appointment to be seen next week. Take the tramadol for pain, if it isn't helping use the oxycodone. Take the zofran for nausea or vomiting.  If you don't hear from Dr Olevia Perches office by tomorrow morning, you can call to get an appointment. Return to the ED if you get a fever, worsening pain, uncontrolled vomiting.    Hepatomegaly Hepatomegaly means the liver is larger than normal (enlarged). Some health problems can cause the liver to get bigger. Some people have an enlarged liver but do not know it.  CAUSES Possible causes of hepatomegaly include:  Liver disease, such as:  Cirrhosis. This is long-term (chronic) liver damage often caused by drinking too much alcohol. Cirrhosis may also be caused by other liver problems.  Hepatitis. This is an infection of the liver.  Fatty liver disease.  Disorders that cause things to accumulate in the liver (Wilson's disease, amyloidosis, hemochromatosis).  Cancer. The disease may start in the liver, or cancer may start somewhere else in the body and spread to the liver.  Heart or blood vessel disease. These can cause hepatomegaly if blood backs up into the liver. SYMPTOMS  Some people have no symptoms. If symptoms are present, they may include:  Abdominal pain on the right side.  Fatigue.  Loss of appetite.  Nausea.  Vomiting.  Yellowing of the skin and whites of the eyes (jaundice). DIAGNOSIS  To decide if your liver is enlarged, a caregiver will ask about your history and perform a physical exam. The caregiver may press on the right side of your abdomen to feel your liver. This is a way to check if the edge of your liver sticks out below your rib cage. Your caregiver may also order some tests that include:  Blood tests. These tests check whether your liver is working like it should be. They also check for infection.  Imaging tests. These are tests that take pictures of your liver. They may  include:  A computed tomography (CT) scan. This is an X-ray guided by a computer.  Magnetic resonance imaging (MRI). This test creates pictures by using magnets and a computer.  An ultrasound. Images are made using sound waves.  Liver biopsy. A small sample of liver tissue is taken out and examined under a microscope. TREATMENT  Treatment of hepatomegaly depends on what is causing it. HOME CARE INSTRUCTIONS What you need to do at home depends on the cause of your hepatomegaly. In general:  Take all medicine as directed by your caregiver. Follow the directions carefully. Do not start taking any new medicine unless your caregiver says it is okay. This includes over-the-counter medicines, supplements, and herbal remedies. Some of these can hurt your liver.  Stay at a healthy weight.  Follow a healthy diet. Eat lots of fruits, vegetables, and whole grains.  Do not drink alcohol.  Do not smoke.  Keep all follow-up appointments to make sure your treatment is working and your liver stays healthy. SEEK MEDICAL CARE IF:  You have increased or localized abdominal pain.  You have persistent vomiting. SEEK IMMEDIATE MEDICAL CARE IF:   You vomit bright red blood or blood that looks like coffee grounds.  You have chest pain.  You have trouble breathing. MAKE SURE YOU:  Understand these instructions.  Will watch your condition.  Will get help right away if you are not doing well or get worse. Document Released: 07/04/2011 Document Reviewed: 07/04/2011 [employer] Patient Information 2015  [employer]. This information is not intended to replace advice given to you by your health care provider. Make sure you discuss any questions you have with your health care provider.

## 2014-03-11 ENCOUNTER — Ambulatory Visit (INDEPENDENT_AMBULATORY_CARE_PROVIDER_SITE_OTHER): Payer: BC Managed Care – PPO | Admitting: Internal Medicine

## 2014-03-11 ENCOUNTER — Encounter (INDEPENDENT_AMBULATORY_CARE_PROVIDER_SITE_OTHER): Payer: Self-pay | Admitting: *Deleted

## 2014-03-11 ENCOUNTER — Encounter (INDEPENDENT_AMBULATORY_CARE_PROVIDER_SITE_OTHER): Payer: Self-pay | Admitting: Internal Medicine

## 2014-03-11 ENCOUNTER — Telehealth (INDEPENDENT_AMBULATORY_CARE_PROVIDER_SITE_OTHER): Payer: Self-pay | Admitting: *Deleted

## 2014-03-11 ENCOUNTER — Other Ambulatory Visit (INDEPENDENT_AMBULATORY_CARE_PROVIDER_SITE_OTHER): Payer: Self-pay | Admitting: *Deleted

## 2014-03-11 VITALS — BP 124/74 | HR 84 | Temp 98.3°F | Ht 65.5 in | Wt 230.3 lb

## 2014-03-11 DIAGNOSIS — K760 Fatty (change of) liver, not elsewhere classified: Secondary | ICD-10-CM | POA: Insufficient documentation

## 2014-03-11 DIAGNOSIS — Z1211 Encounter for screening for malignant neoplasm of colon: Secondary | ICD-10-CM

## 2014-03-11 DIAGNOSIS — R109 Unspecified abdominal pain: Secondary | ICD-10-CM | POA: Insufficient documentation

## 2014-03-11 DIAGNOSIS — Z800 Family history of malignant neoplasm of digestive organs: Secondary | ICD-10-CM | POA: Insufficient documentation

## 2014-03-11 DIAGNOSIS — Z87898 Personal history of other specified conditions: Secondary | ICD-10-CM

## 2014-03-11 NOTE — Telephone Encounter (Signed)
Patient needs trilyte 

## 2014-03-11 NOTE — Patient Instructions (Signed)
Bone, Scan, Colonoscopy.

## 2014-03-11 NOTE — Progress Notes (Addendum)
Subjective:    Patient ID: [name], female    DOB: Jun 03, 1970, 43 y.o.   MRN: [id number]  HPI ED referral for hepatomegaly. Noted on CT. Seen in ED 03/05/2014. She had pain rt flank. She says today the pain is in her ribs and back.  She has had the pain for at least at a year. Rates the pain at a 7. Nothing makes the pain worse. No hx of kidney stones. No family hx of liver disease. Hx of rectal cancer in a brother now deceased at age 6. Appetite has been okay. She has had acid reflux. There has been no weight loss. She does have nausea. No change in pain after eating.  Weight gain of 40 pounds over the past year. She says it is due to death of 2 family members.  There has been no injury to her chest.  Cholecystectomy 2001 for gallstones. BMs are normal. No melena or BRRB.  03/05/2014 AST 15, ALT 29, ALP 97, H and H 14.9 and 43.7, Platelet ct normal at 322.  03/05/2014 CT abdomen with/wo CM: abdominal pain  IMPRESSION: Hepatomegaly with hepatic steatosis.  Gallbladder is absent.   No bowel obstruction or mesenteric inflammation. No abscess. Appendix appears normal.   There is scattered colonic diverticulosis without diverticulitis CMP     Component Value Date/Time   NA 139 03/05/2014 0615   K 4.4 03/05/2014 0615   CL 102 03/05/2014 0615   CO2 23 03/05/2014 0615   GLUCOSE 145* 03/05/2014 0615   BUN 18 03/05/2014 0615   CREATININE 0.60 03/05/2014 0615   CALCIUM 9.3 03/05/2014 0615   PROT 7.4 03/05/2014 0615   ALBUMIN 4.0 03/05/2014 0615   AST 15 03/05/2014 0615   ALT 29 03/05/2014 0615   ALKPHOS 97 03/05/2014 0615   BILITOT 0.3 03/05/2014 0615   GFRNONAA >90 03/05/2014 0615   GFRAA >90 03/05/2014 0615    CBC    Component Value Date/Time   WBC 10.6* 03/05/2014 0615   RBC 4.99 03/05/2014 0615   HGB 14.9 03/05/2014 0615   HCT 43.7 03/05/2014 0615   PLT 322 03/05/2014 0615   MCV 87.6 03/05/2014 0615   MCH 29.9 03/05/2014 0615   MCHC 34.1 03/05/2014 0615   RDW 14.2 03/05/2014 0615   LYMPHSABS 3.8 03/05/2014 0615   MONOABS 0.5 03/05/2014 0615   EOSABS 0.6 03/05/2014 0615   BASOSABS 0.1 03/05/2014 0615      Review of Systems  Past Medical History  Diagnosis Date  . Sciatica   . Carpal tunnel syndrome, bilateral   . PONV (postoperative nausea and vomiting)     Past Surgical History  Procedure Laterality Date  . Right knee arthroscopy    . Knee surgery    . Tubal ligation    . Ablasion of uterus    . Cholecystectomy      [facility]  . Carpal tunnel release  11/10/2010    Procedure: CARPAL TUNNEL RELEASE;  Surgeon: Arther Abbott, MD;  Location: [facility];  Service: Orthopedics;  Laterality: Right;    Allergies  Allergen Reactions  . Hydrocodone Nausea And Vomiting    Current Outpatient Prescriptions on File Prior to Visit  Medication Sig Dispense Refill  . ibuprofen (ADVIL,MOTRIN) 200 MG tablet Take 800 mg by mouth 2 (two) times daily as needed. For pain    . ondansetron (ZOFRAN) 4 MG tablet Take 1 tablet (4 mg total) by mouth every 8 (eight) hours as needed for nausea or vomiting. [place]  tablet 0  . oxyCODONE (ROXICODONE) 5 MG immediate release tablet Take 1 tablet (5 mg total) by mouth every 6 (six) hours as needed for severe pain. 30 tablet 0  . traMADol (ULTRAM) 50 MG tablet Take 2 tablets (100 mg total) by mouth every 6 (six) hours as needed for moderate pain. 40 tablet 0   No current facility-administered medications on file prior to visit.     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 Skin warm and dry. Oral mucosa is moist.   . Sclera anicteric, conjunctivae is pink. Thyroid not enlarged. No cervical lymphadenopathy. Lungs clear. Heart regular rate and rhythm.  Abdomen is soft. Bowel sounds are positive. No hepatomegaly. No abdominal masses felt. Tenderness rt flank  area.  No edema to lower extremities. Pain when standing and twisting to the left.        Assessment & Plan:  Rt flank pain. Probable Muscle skeletal in nature. Family hx of colon cancer in a brother age 82 who is now deceased. Fatty liver with normal liver enzymes. Bone scan, Colonoscopy. The risks and benefits such as perforation, bleeding, and infection were reviewed with the patient and is agreeable. Patient needs to diet and exercise.

## 2014-03-12 ENCOUNTER — Ambulatory Visit (INDEPENDENT_AMBULATORY_CARE_PROVIDER_SITE_OTHER): Payer: BC Managed Care – PPO | Admitting: Internal Medicine

## 2014-03-12 MED ORDER — PEG 3350-KCL-NA BICARB-NACL 420 G PO SOLR: 4000.0000 mL | Freq: Once | ORAL | Status: DC

## 2014-03-14 ENCOUNTER — Encounter (HOSPITAL_COMMUNITY)
Admission: RE | Admit: 2014-03-14 | Discharge: 2014-03-14 | Disposition: A | Discharge status: Home / Self Care | Payer: BC Managed Care – PPO | Source: Ambulatory Visit | Attending: Internal Medicine | Admitting: Internal Medicine

## 2014-03-14 ENCOUNTER — Encounter (HOSPITAL_COMMUNITY): Payer: Self-pay

## 2014-03-14 DIAGNOSIS — Z87898 Personal history of other specified conditions: Secondary | ICD-10-CM | POA: Diagnosis not present

## 2014-03-14 MED ORDER — TECHNETIUM TC 99M MEDRONATE IV KIT
25.0000 | PACK | Freq: Once | INTRAVENOUS | Status: AC | PRN
  Administered 2014-03-14: 24.5 via INTRAVENOUS

## 2014-03-28 ENCOUNTER — Ambulatory Visit (HOSPITAL_COMMUNITY)
Admission: RE | Admit: 2014-03-28 | Discharge: 2014-03-28 | Disposition: A | Discharge status: Home / Self Care | Payer: BC Managed Care – PPO | Source: Ambulatory Visit | Attending: Internal Medicine | Admitting: Internal Medicine

## 2014-03-28 ENCOUNTER — Encounter (HOSPITAL_COMMUNITY)
Admission: RE | Disposition: A | Discharge status: Home / Self Care | Payer: Self-pay | Source: Ambulatory Visit | Attending: Internal Medicine

## 2014-03-28 DIAGNOSIS — Z1211 Encounter for screening for malignant neoplasm of colon: Secondary | ICD-10-CM | POA: Insufficient documentation

## 2014-03-28 DIAGNOSIS — D125 Benign neoplasm of sigmoid colon: Secondary | ICD-10-CM | POA: Diagnosis not present

## 2014-03-28 DIAGNOSIS — F17210 Nicotine dependence, cigarettes, uncomplicated: Secondary | ICD-10-CM | POA: Diagnosis not present

## 2014-03-28 DIAGNOSIS — Z800 Family history of malignant neoplasm of digestive organs: Secondary | ICD-10-CM | POA: Diagnosis not present

## 2014-03-28 DIAGNOSIS — K644 Residual hemorrhoidal skin tags: Secondary | ICD-10-CM | POA: Diagnosis not present

## 2014-03-28 DIAGNOSIS — K5730 Diverticulosis of large intestine without perforation or abscess without bleeding: Secondary | ICD-10-CM | POA: Insufficient documentation

## 2014-03-28 HISTORY — PX: COLONOSCOPY: SHX5424

## 2014-03-28 SURGERY — COLONOSCOPY
Anesthesia: Moderate Sedation

## 2014-03-28 MED ORDER — SODIUM CHLORIDE 0.9 % IV SOLN: INTRAVENOUS | Status: DC

## 2014-03-28 MED ORDER — MIDAZOLAM HCL 5 MG/5ML IJ SOLN
INTRAMUSCULAR | Status: DC | PRN
  Administered 2014-03-28 (×2): 2 mg via INTRAVENOUS
  Administered 2014-03-28: 3 mg via INTRAVENOUS
  Administered 2014-03-28: 1 mg via INTRAVENOUS
  Administered 2014-03-28: 2 mg via INTRAVENOUS

## 2014-03-28 MED ORDER — MEPERIDINE HCL 50 MG/ML IJ SOLN
INTRAMUSCULAR | Status: DC | PRN
  Administered 2014-03-28 (×3): 25 mg via INTRAVENOUS

## 2014-03-28 MED ORDER — STERILE WATER FOR IRRIGATION IR SOLN
Status: DC | PRN
  Administered 2014-03-28: 08:00:00

## 2014-03-28 NOTE — H&P (Signed)
[name] is an 43 y.o. female.   Chief Complaint: Patient is here for colonoscopy. HPI: Patient is 43 year old Caucasian female who is here for high risk screening colonoscopy. Her brother was diagnosed with rectal adenocarcinoma at age 62 and oto-metastatic disease at age 30 earlier this year. Patient denies abdominal pain change in bowel habits or rectal bleeding.  Past Medical History  Diagnosis Date  . Sciatica   . Carpal tunnel syndrome, bilateral   . PONV (postoperative nausea and vomiting)     Past Surgical History  Procedure Laterality Date  . Right knee arthroscopy    . Knee surgery    . Tubal ligation    . Ablasion of uterus    . Cholecystectomy      [facility]  . Carpal tunnel release  11/10/2010    Procedure: CARPAL TUNNEL RELEASE;  Surgeon: Arther Abbott, MD;  Location: [facility];  Service: Orthopedics;  Laterality: Right;    Family History  Problem Relation Age of Onset  . Cancer    . Diabetes    . Arthritis    . Asthma    . Diabetes type I Mother   . Hyperlipidemia Mother   . Diabetes type II Father   . Hypertension Father   . Hyperlipidemia Father   . Diabetes type II Brother   . Anesthesia problems Neg Hx   . Diabetes type II Brother    Social History:  reports that she has been smoking Cigarettes.  She has been smoking about 0.00 packs per day for the past 26 years. She has never used smokeless tobacco. She reports that she does not drink alcohol or use illicit drugs.  Allergies:  Allergies  Allergen Reactions  . Hydrocodone Nausea And Vomiting    Medications Prior to Admission  Medication Sig Dispense Refill  . ibuprofen (ADVIL,MOTRIN) 200 MG tablet Take 800 mg by mouth 2 (two) times daily as needed. For pain    . ondansetron (ZOFRAN) 4 MG tablet Take 1 tablet (4 mg total) by mouth every 8 (eight) hours as needed for nausea or vomiting. 20 tablet 0  . oxyCODONE (ROXICODONE) 5 MG immediate release tablet Take 1 tablet (5 mg total) by mouth every 6  (six) hours as needed for severe pain. 30 tablet 0  . polyethylene glycol-electrolytes (NULYTELY/GOLYTELY) 420 G solution Take 4,000 mLs by mouth once. 4000 mL 0  . traMADol (ULTRAM) 50 MG tablet Take 2 tablets (100 mg total) by mouth every 6 (six) hours as needed for moderate pain. 40 tablet 0    No results found for this or any previous visit (from the past 48 hour(s)). No results found.  ROS  Blood pressure 134/80, pulse 92, temperature 98.6 F (37 C), temperature source Oral, resp. rate 18, height 5' 5.5" (1.664 m), weight 230 lb (104.327 kg), SpO2 97 %. Physical Exam  Constitutional: She appears well-developed and well-nourished.  HENT:  Mouth/Throat: Oropharynx is clear and moist.  Eyes: Conjunctivae are normal. No scleral icterus.  Neck: No thyromegaly present.  Cardiovascular: Normal rate, regular rhythm and normal heart sounds.   No murmur heard. Respiratory: Effort normal and breath sounds normal.  GI: Soft. There is no tenderness.  Liver edge palpable below right costal margin; it is soft and nontender.  Musculoskeletal: She exhibits no edema.  Lymphadenopathy:    She has no cervical adenopathy.  Neurological: She is alert.  Skin: Skin is warm and dry.     Assessment/Plan High risk screening colonoscopy. History of  rectal adenocarcinoma sibling at age 4(died at 23).  REHMAN,NAJEEB U 04-11-2014, 7:44 AM

## 2014-03-28 NOTE — Op Note (Signed)
COLONOSCOPY PROCEDURE REPORT  PATIENT:  [name]  MR#:  [id number] Birthdate:  Dec 09, 1970, 43 y.o., female Endoscopist:  Dr. Rogene Houston, MD Referred By:  Dr. Rosita Fire, MD  Procedure Date: 03/28/2014  Procedure:   Colonoscopy with snare polypectomy.  Indications:  Patient is 43 year old Caucasian female who is undergoing high risk screening colonoscopy. Her brother was diagnosed with rectal adenocarcinoma at age 26 and died at age 31 of metastatic disease. Patient does not have any GI symptoms.  Informed Consent:  The procedure and risks were reviewed with the patient and informed consent was obtained.  Medications:  Demerol 75 mg IV Versed 10 mg IV  Description of procedure:  After a digital rectal exam was performed, that colonoscope was advanced from the anus through the rectum and colon to the area of the cecum, ileocecal valve and appendiceal orifice. The cecum was deeply intubated. These structures were well-seen and photographed for the record. From the level of the cecum and ileocecal valve, the scope was slowly and cautiously withdrawn. The mucosal surfaces were carefully surveyed utilizing scope tip to flexion to facilitate fold flattening as needed. The scope was pulled down into the rectum where a thorough exam including retroflexion was performed.  Findings:   Prep satisfactory. Few small diverticula at sigmoid colon. 7 mm polyp hot snared from distal sigmoid colon. Another small polyp removed from distal sigmoid colon via cold biopsy. Both of these polyps were submitted together. Normal rectal mucosa. Small hemorrhoids below the dentate line.   Therapeutic/Diagnostic Maneuvers Performed:  See above  Complications:  None  Cecal Withdrawal Time:  15 minutes  Impression:  Examination performed to cecum. Few small diverticula at sigmoid colon. 7 mm polyp hot snare from distal sigmoid colon. 3 mm polyp removed via cold biopsy also from distal sigmoid  colon. Both of these polyps were submitted together. Small external hemorrhoids.  Recommendations:  Standard instructions given. High fiber diet. I will contact patient with biopsy results and further recommendations.  Luisfelipe Engelstad U  03/28/2014 8:21 AM  CC: Dr. Rosita Fire, MD & Dr. Rayne Du ref. provider found

## 2014-03-28 NOTE — Discharge Instructions (Signed)
No aspirin or NSAIDs for 1 week. Resume other medications and high fiber diet as before. No driving for 24 hours. Physician will call with biopsy results.  Colonoscopy, Care After Refer to this sheet in the next few weeks. These instructions provide you with information on caring for yourself after your procedure. Your health care provider may also give you more specific instructions. Your treatment has been planned according to current medical practices, but problems sometimes occur. Call your health care provider if you have any problems or questions after your procedure. WHAT TO EXPECT AFTER THE PROCEDURE  After your procedure, it is typical to have the following:  A small amount of blood in your stool.  Moderate amounts of gas and mild abdominal cramping or bloating. HOME CARE INSTRUCTIONS  Do not drive, operate machinery, or sign important documents for 24 hours.  You may shower and resume your regular physical activities, but move at a slower pace for the first 24 hours.  Take frequent rest periods for the first 24 hours.  Walk around or put a warm pack on your abdomen to help reduce abdominal cramping and bloating.  Drink enough fluids to keep your urine clear or pale yellow.  You may resume your normal diet as instructed by your health care provider. Avoid heavy or fried foods that are hard to digest.  Avoid drinking alcohol for 24 hours or as instructed by your health care provider.  Only take over-the-counter or prescription medicines as directed by your health care provider.  If a tissue sample (biopsy) was taken during your procedure:  Do not take aspirin or blood thinners for 7 days, or as instructed by your health care provider.  Do not drink alcohol for 7 days, or as instructed by your health care provider.  Eat soft foods for the first 24 hours. SEEK MEDICAL CARE IF: You have persistent spotting of blood in your stool 2-3 days after the procedure. SEEK  IMMEDIATE MEDICAL CARE IF:  You have more than a small spotting of blood in your stool.  You pass large blood clots in your stool.  Your abdomen is swollen (distended).  You have nausea or vomiting.  You have a fever.  You have increasing abdominal pain that is not relieved with medicine..   Colon Polyps Polyps are lumps of extra tissue growing inside the body. Polyps can grow in the large intestine (colon). Most colon polyps are noncancerous (benign). However, some colon polyps can become cancerous over time. Polyps that are larger than a pea may be harmful. To be safe, caregivers remove and test all polyps. CAUSES  Polyps form when mutations in the genes cause your cells to grow and divide even though no more tissue is needed. RISK FACTORS There are a number of risk factors that can increase your chances of getting colon polyps. They include:  Being older than 50 years.  Family history of colon polyps or colon cancer.  Long-term colon diseases, such as colitis or Crohn disease.  Being overweight.  Smoking.  Being inactive.  Drinking too much alcohol. SYMPTOMS  Most small polyps do not cause symptoms. If symptoms are present, they may include:  Blood in the stool. The stool may look dark red or black.  Constipation or diarrhea that lasts longer than 1 week. DIAGNOSIS People often do not know they have polyps until their caregiver finds them during a regular checkup. Your caregiver can use 4 tests to check for polyps:  Digital rectal exam. The caregiver  wears gloves and feels inside the rectum. This test would find polyps only in the rectum.  Barium enema. The caregiver puts a liquid called barium into your rectum before taking X-rays of your colon. Barium makes your colon look white. Polyps are dark, so they are easy to see in the X-ray pictures.  Sigmoidoscopy. A thin, flexible tube (sigmoidoscope) is placed into your rectum. The sigmoidoscope has a light and tiny  camera in it. The caregiver uses the sigmoidoscope to look at the last third of your colon.  Colonoscopy. This test is like sigmoidoscopy, but the caregiver looks at the entire colon. This is the most common method for finding and removing polyps. TREATMENT  Any polyps will be removed during a sigmoidoscopy or colonoscopy. The polyps are then tested for cancer. PREVENTION  To help lower your risk of getting more colon polyps:  Eat plenty of fruits and vegetables. Avoid eating fatty foods.  Do not smoke.  Avoid drinking alcohol.  Exercise every day.  Lose weight if recommended by your caregiver.  Eat plenty of calcium and folate. Foods that are rich in calcium include milk, cheese, and broccoli. Foods that are rich in folate include chickpeas, kidney beans, and spinach. HOME CARE INSTRUCTIONS Keep all follow-up appointments as directed by your caregiver. You may need periodic exams to check for polyps. SEEK MEDICAL CARE IF: You notice bleeding during a bowel movement.

## 2014-04-03 ENCOUNTER — Encounter (INDEPENDENT_AMBULATORY_CARE_PROVIDER_SITE_OTHER): Payer: Self-pay | Admitting: *Deleted

## 2014-04-03 ENCOUNTER — Encounter (HOSPITAL_COMMUNITY): Payer: Self-pay | Admitting: Internal Medicine

## 2014-12-26 ENCOUNTER — Encounter (HOSPITAL_COMMUNITY): Payer: Self-pay | Admitting: Emergency Medicine

## 2014-12-26 ENCOUNTER — Emergency Department (HOSPITAL_COMMUNITY): Payer: BLUE CROSS/BLUE SHIELD

## 2014-12-26 ENCOUNTER — Emergency Department (HOSPITAL_COMMUNITY)
Admission: EM | Admit: 2014-12-26 | Discharge: 2014-12-26 | Disposition: A | Discharge status: Home / Self Care | Payer: BLUE CROSS/BLUE SHIELD | Attending: Emergency Medicine | Admitting: Emergency Medicine

## 2014-12-26 DIAGNOSIS — K5732 Diverticulitis of large intestine without perforation or abscess without bleeding: Secondary | ICD-10-CM | POA: Diagnosis not present

## 2014-12-26 DIAGNOSIS — Z720 Tobacco use: Secondary | ICD-10-CM | POA: Insufficient documentation

## 2014-12-26 DIAGNOSIS — Z8739 Personal history of other diseases of the musculoskeletal system and connective tissue: Secondary | ICD-10-CM | POA: Diagnosis not present

## 2014-12-26 DIAGNOSIS — Z8669 Personal history of other diseases of the nervous system and sense organs: Secondary | ICD-10-CM | POA: Diagnosis not present

## 2014-12-26 DIAGNOSIS — R1030 Lower abdominal pain, unspecified: Secondary | ICD-10-CM | POA: Diagnosis present

## 2014-12-26 LAB — CBC
HCT: 43.2 % (ref 36.0–46.0)
HEMOGLOBIN: 15.1 g/dL — AB (ref 12.0–15.0)
MCH: 30.2 pg (ref 26.0–34.0)
MCHC: 35 g/dL (ref 30.0–36.0)
MCV: 86.4 fL (ref 78.0–100.0)
Platelets: 319 10*3/uL (ref 150–400)
RBC: 5 MIL/uL (ref 3.87–5.11)
RDW: 13.9 % (ref 11.5–15.5)
WBC: 15.3 10*3/uL — ABNORMAL HIGH (ref 4.0–10.5)

## 2014-12-26 LAB — URINALYSIS, ROUTINE W REFLEX MICROSCOPIC
BILIRUBIN URINE: NEGATIVE
GLUCOSE, UA: 250 mg/dL — AB
Ketones, ur: NEGATIVE mg/dL
Nitrite: NEGATIVE
PROTEIN: NEGATIVE mg/dL
Specific Gravity, Urine: >1.03 — ABNORMAL HIGH (ref 1.005–1.030)
Urobilinogen, UA: 0.2 mg/dL (ref 0.0–1.0)
pH: 6 (ref 5.0–8.0)

## 2014-12-26 LAB — COMPREHENSIVE METABOLIC PANEL
ALBUMIN: 4 g/dL (ref 3.5–5.0)
ALT: 30 U/L (ref 14–54)
ANION GAP: 7 (ref 5–15)
AST: 17 U/L (ref 15–41)
Alkaline Phosphatase: 87 U/L (ref 38–126)
BILIRUBIN TOTAL: 0.8 mg/dL (ref 0.3–1.2)
BUN: 11 mg/dL (ref 6–20)
CALCIUM: 9.2 mg/dL (ref 8.9–10.3)
CO2: 22 mmol/L (ref 22–32)
CREATININE: 0.64 mg/dL (ref 0.44–1.00)
Chloride: 102 mmol/L (ref 101–111)
GFR calc Af Amer: >60 mL/min (ref >60)
GFR calc non Af Amer: >60 mL/min (ref >60)
GLUCOSE: 256 mg/dL — AB (ref 65–99)
Potassium: 3.8 mmol/L (ref 3.5–5.1)
SODIUM: 131 mmol/L — AB (ref 135–145)
TOTAL PROTEIN: 7.5 g/dL (ref 6.5–8.1)

## 2014-12-26 LAB — I-STAT CG4 LACTIC ACID, ED: Lactic Acid, Venous: 1.77 mmol/L (ref 0.5–2.0)

## 2014-12-26 LAB — POC OCCULT BLOOD, ED: Fecal Occult Bld: NEGATIVE

## 2014-12-26 LAB — URINE MICROSCOPIC-ADD ON

## 2014-12-26 MED ORDER — CIPROFLOXACIN IN D5W 400 MG/200ML IV SOLN
400.0000 mg | Freq: Once | INTRAVENOUS | Status: AC
  Administered 2014-12-26: 400 mg via INTRAVENOUS
  Filled 2014-12-26: qty 200

## 2014-12-26 MED ORDER — MORPHINE SULFATE (PF) 4 MG/ML IV SOLN
4.0000 mg | Freq: Once | INTRAVENOUS | Status: AC
  Administered 2014-12-26: 4 mg via INTRAVENOUS
  Filled 2014-12-26: qty 1

## 2014-12-26 MED ORDER — HYDROCODONE-ACETAMINOPHEN 5-325 MG PO TABS: 1.0000 | ORAL_TABLET | Freq: Four times a day (QID) | ORAL | Status: DC | PRN

## 2014-12-26 MED ORDER — IOHEXOL 300 MG/ML  SOLN: 100.0000 mL | Freq: Once | INTRAMUSCULAR | Status: DC | PRN

## 2014-12-26 MED ORDER — DOCUSATE SODIUM 100 MG PO CAPS: 100.0000 mg | ORAL_CAPSULE | Freq: Two times a day (BID) | ORAL | Status: DC

## 2014-12-26 MED ORDER — ONDANSETRON HCL 4 MG/2ML IJ SOLN
4.0000 mg | Freq: Once | INTRAMUSCULAR | Status: AC
  Administered 2014-12-26: 4 mg via INTRAVENOUS
  Filled 2014-12-26: qty 2

## 2014-12-26 MED ORDER — METRONIDAZOLE 500 MG PO TABS: 500.0000 mg | ORAL_TABLET | Freq: Two times a day (BID) | ORAL | Status: DC

## 2014-12-26 MED ORDER — ONDANSETRON 8 MG PO TBDP: ORAL_TABLET | ORAL | Status: DC

## 2014-12-26 MED ORDER — CIPROFLOXACIN HCL 500 MG PO TABS: 500.0000 mg | ORAL_TABLET | Freq: Two times a day (BID) | ORAL | Status: DC

## 2014-12-26 MED ORDER — METRONIDAZOLE IN NACL 5-0.79 MG/ML-% IV SOLN
500.0000 mg | Freq: Once | INTRAVENOUS | Status: AC
  Administered 2014-12-26: 500 mg via INTRAVENOUS
  Filled 2014-12-26: qty 100

## 2014-12-26 MED ORDER — SODIUM CHLORIDE 0.9 % IV BOLUS (SEPSIS)
1000.0000 mL | Freq: Once | INTRAVENOUS | Status: AC
  Administered 2014-12-26: 1000 mL via INTRAVENOUS

## 2014-12-26 NOTE — ED Provider Notes (Signed)
CSN: [id number]     Arrival date & time 12/26/14  4098 History   First MD Initiated Contact with Patient 12/26/14 [phone]     Chief Complaint  Patient presents with  . Abdominal Pain     (Consider location/radiation/quality/duration/timing/severity/associated sxs/prior Treatment) Patient is a 44 y.o. female presenting with abdominal pain.  Abdominal Pain Pain location:  Suprapubic Pain quality: aching, cramping and fullness   Pain radiates to:  Does not radiate Pain severity:  Mild Onset quality:  Gradual Duration:  2 days Timing:  Constant Progression:  Worsening Chronicity:  New Context: previous surgery   Context: not diet changes, not eating and not suspicious food intake   Relieved by:  None tried Worsened by:  Nothing tried Ineffective treatments:  None tried Associated symptoms: chills and constipation   Associated symptoms: no anorexia, no belching, no chest pain, no cough, no diarrhea, no dysuria, no fatigue, no fever, no flatus, no hematemesis, no hematochezia, no melena, no nausea, no shortness of breath, no sore throat, no vaginal bleeding, no vaginal discharge and no vomiting     Past Medical History  Diagnosis Date  . Sciatica   . Carpal tunnel syndrome, bilateral   . PONV (postoperative nausea and vomiting)    Past Surgical History  Procedure Laterality Date  . Right knee arthroscopy    . Knee surgery    . Tubal ligation    . Ablasion of uterus    . Cholecystectomy      [facility]  . Carpal tunnel release  11/10/2010    Procedure: CARPAL TUNNEL RELEASE;  Surgeon: Arther Abbott, MD;  Location: [facility];  Service: Orthopedics;  Laterality: Right;  . Colonoscopy N/A 03/28/2014    Procedure: COLONOSCOPY;  Surgeon: Rogene Houston, MD;  Location: [facility];  Service: Endoscopy;  Laterality: N/A;  730   Family History  Problem Relation Age of Onset  . Cancer    . Diabetes    . Arthritis    . Asthma    . Diabetes type I Mother   . Hyperlipidemia Mother   .  Diabetes type II Father   . Hypertension Father   . Hyperlipidemia Father   . Diabetes type II Brother   . Anesthesia problems Neg Hx   . Diabetes type II Brother    Social History  Substance Use Topics  . Smoking status: Current Every Day Smoker -- 0.00 packs/day for 26 years    Types: Cigarettes  . Smokeless tobacco: Never Used  . Alcohol Use: No   OB History    No data available     Review of Systems  Constitutional: Positive for chills. Negative for fever and fatigue.  HENT: Negative for sore throat.   Respiratory: Negative for cough and shortness of breath.   Cardiovascular: Negative for chest pain.  Gastrointestinal: Positive for abdominal pain, constipation and rectal pain. Negative for nausea, vomiting, diarrhea, blood in stool, melena, hematochezia, abdominal distention, anal bleeding, anorexia, flatus and hematemesis.  Genitourinary: Negative for dysuria, vaginal bleeding and vaginal discharge.  All other systems reviewed and are negative.     Allergies  Hydrocodone  Home Medications   Prior to Admission medications   Medication Sig Start Date End Date Taking? Authorizing Provider  ibuprofen (ADVIL,MOTRIN) 200 MG tablet Take 800 mg by mouth 2 (two) times daily as needed. For pain   Yes Historical Provider, MD   BP 105/66 mmHg  Pulse 65  Temp(Src) 98.1 F (36.7 C)  Resp 16  Ht 5\' 5"  (1.651 m)  Wt 220 lb (99.791 kg)  BMI 36.61 kg/m2  SpO2 97% Physical Exam  Constitutional: She is oriented to person, place, and time. She appears well-developed and well-nourished.  HENT:  Head: Normocephalic and atraumatic.  Eyes: Conjunctivae and EOM are normal. Right eye exhibits no discharge. Left eye exhibits no discharge.  Cardiovascular: Normal rate and regular rhythm.   Pulmonary/Chest: Effort normal and breath sounds normal. No respiratory distress.  Abdominal: Soft. She exhibits no distension. There is no tenderness. There is no rebound.  Genitourinary: Rectal  exam shows no external hemorrhoid, no internal hemorrhoid, no fissure, no mass, no tenderness and anal tone normal.  Musculoskeletal: Normal range of motion. She exhibits no edema or tenderness.  Neurological: She is alert and oriented to person, place, and time.  Skin: Skin is warm and dry.  Nursing note and vitals reviewed.   ED Course  Procedures (including critical care time) Labs Review Labs Reviewed  COMPREHENSIVE METABOLIC PANEL - Abnormal; Notable for the following:    Sodium 131 (*)    Glucose, Bld 256 (*)    All other components within normal limits  CBC - Abnormal; Notable for the following:    WBC 15.3 (*)    Hemoglobin 15.1 (*)    All other components within normal limits  URINALYSIS, ROUTINE W REFLEX MICROSCOPIC (NOT AT [facility]) - Abnormal; Notable for the following:    Specific Gravity, Urine >1.030 (*)    Glucose, UA 250 (*)    Hgb urine dipstick SMALL (*)    Leukocytes, UA SMALL (*)    All other components within normal limits  URINE MICROSCOPIC-ADD ON - Abnormal; Notable for the following:    Squamous Epithelial / LPF MANY (*)    Bacteria, UA MANY (*)    All other components within normal limits  OCCULT BLOOD X 1 CARD TO LAB, STOOL  POC OCCULT BLOOD, ED  I-STAT CG4 LACTIC ACID, ED    Imaging Review Ct Abdomen Pelvis W Contrast  12/26/2014   CLINICAL DATA:  44 year old with lower abdominal pain and cramping. Diarrhea and nausea for 2 days.  EXAM: CT ABDOMEN AND PELVIS WITH CONTRAST  TECHNIQUE: Multidetector CT imaging of the abdomen and pelvis was performed using the standard protocol following bolus administration of intravenous contrast.  CONTRAST:  100 mL Omnipaque 300  COMPARISON:  03/05/2014  FINDINGS: Lung bases are clear.  Negative for free air.  Low-attenuation of the liver is suggestive for hepatic steatosis. No focal liver lesion. The gallbladder has been removed. Portal venous system is patent.  Normal appearance of the pancreas, spleen and adrenal glands.  Normal appearance of both kidneys. Normal appearance of the urinary bladder. No acute abnormality to the stomach or duodenum.  Again noted are small periaortic lymph nodes. Atherosclerotic calcifications involving the distal abdominal aorta and iliac arteries without aneurysm.  There is inflammation surrounding the sigmoid colon on sequence 2, image 68. Small amount of fluid in the pelvis. Inflammatory changes involving the sigmoid mesocolon. Small colonic diverticular are present at this area of inflammation. No evidence for an abscess collection.  No acute abnormality involving the uterus or adnexal tissue. There may be a small hyperdense or peripherally enhancing follicle in the right ovary.  Normal appearance of the appendix. Normal appearance of small bowel.  No acute bone abnormality.  IMPRESSION: Inflammation involving the sigmoid colon in the presence of colonic diverticula. Findings are most compatible with acute diverticulitis. A small amount of free fluid  in the pelvis but no evidence for an abscess.  Hepatic steatosis.   Electronically Signed   By: Markus Daft M.D.   On: 12/26/2014 09:09   I have personally reviewed and evaluated these images and lab results as part of my medical decision-making.   EKG Interpretation None      MDM   Final diagnoses:  Diverticulitis of large intestine without perforation or abscess without bleeding   44 year old female a strong family history of colon cancer presents to the emergency department today with 2 days of progressively worsening constipation, lower abdominal cramping. Exam relatively benign as above. However on review of her records she has had a history of diverticulosis on a recent colonoscopy making diverticulitis of possible cause. Also theoretically could've had a cancer that would cause of colonic obstruction. Her rectal exam was without stool vault so no impaction that I can clear in the ED. I will start with getting labs, fluids and pain  medicine, and a CT scan to evaluate for obstruction.   CT scan and labs consistent with acute uncomplicated diverticulitis. His likely the cause of her symptoms. Cipro Flagyl given in the emergency department along with pain medication. Advised patient to try to avoid narcotic pain medicine is much as possible as this will worsen her constipation area however we'll give her a short course of pain medicine and antibiotics for home along with stool softeners. Will return here for any new or worsening symptoms.  I have personally and contemperaneously reviewed labs and imaging and used in my decision making as above.   A medical screening exam was performed and I feel the patient has had an appropriate workup for their chief complaint at this time and likelihood of emergent condition existing is low. They have been counseled on decision, discharge, follow up and which symptoms necessitate immediate return to the emergency department. They or their family verbally stated understanding and agreement with plan and discharged in stable condition.    Merrily Pew, MD 12/26/14 [phone]

## 2014-12-26 NOTE — ED Notes (Signed)
MD at bedside. 

## 2014-12-26 NOTE — ED Notes (Signed)
Pt given discharge information and voiced understanding. Reports abdominal pain and nausea improved. Pt departed under her own power and in NAD.

## 2014-12-26 NOTE — ED Notes (Signed)
Pt c/o lower abd pain-cramping and pressure and rectal pressure x 2 days. Pt reports some diarrhea on Wednesday and has since "felt the urge to go, but can't". Some nausea, denies vomiting.

## 2014-12-31 ENCOUNTER — Encounter (HOSPITAL_COMMUNITY): Payer: Self-pay | Admitting: *Deleted

## 2014-12-31 ENCOUNTER — Emergency Department (HOSPITAL_COMMUNITY)
Admission: EM | Admit: 2014-12-31 | Discharge: 2015-01-01 | Disposition: A | Discharge status: Home / Self Care | Payer: BLUE CROSS/BLUE SHIELD | Attending: Emergency Medicine | Admitting: Emergency Medicine

## 2014-12-31 ENCOUNTER — Emergency Department (HOSPITAL_COMMUNITY): Payer: BLUE CROSS/BLUE SHIELD

## 2014-12-31 DIAGNOSIS — K5732 Diverticulitis of large intestine without perforation or abscess without bleeding: Secondary | ICD-10-CM | POA: Insufficient documentation

## 2014-12-31 DIAGNOSIS — R109 Unspecified abdominal pain: Secondary | ICD-10-CM | POA: Diagnosis present

## 2014-12-31 DIAGNOSIS — B370 Candidal stomatitis: Secondary | ICD-10-CM | POA: Insufficient documentation

## 2014-12-31 DIAGNOSIS — R739 Hyperglycemia, unspecified: Secondary | ICD-10-CM

## 2014-12-31 DIAGNOSIS — Z720 Tobacco use: Secondary | ICD-10-CM | POA: Diagnosis not present

## 2014-12-31 DIAGNOSIS — T887XXA Unspecified adverse effect of drug or medicament, initial encounter: Secondary | ICD-10-CM

## 2014-12-31 DIAGNOSIS — K5792 Diverticulitis of intestine, part unspecified, without perforation or abscess without bleeding: Secondary | ICD-10-CM

## 2014-12-31 DIAGNOSIS — Z3202 Encounter for pregnancy test, result negative: Secondary | ICD-10-CM | POA: Insufficient documentation

## 2014-12-31 DIAGNOSIS — R110 Nausea: Secondary | ICD-10-CM | POA: Diagnosis not present

## 2014-12-31 DIAGNOSIS — M5430 Sciatica, unspecified side: Secondary | ICD-10-CM | POA: Insufficient documentation

## 2014-12-31 DIAGNOSIS — T50905A Adverse effect of unspecified drugs, medicaments and biological substances, initial encounter: Secondary | ICD-10-CM

## 2014-12-31 DIAGNOSIS — Z8669 Personal history of other diseases of the nervous system and sense organs: Secondary | ICD-10-CM | POA: Insufficient documentation

## 2014-12-31 HISTORY — DX: Diverticulitis of intestine, part unspecified, without perforation or abscess without bleeding: K57.92

## 2014-12-31 LAB — COMPREHENSIVE METABOLIC PANEL
ALBUMIN: 4.3 g/dL (ref 3.5–5.0)
ALT: 36 U/L (ref 14–54)
ANION GAP: 10 (ref 5–15)
AST: 21 U/L (ref 15–41)
Alkaline Phosphatase: 85 U/L (ref 38–126)
BUN: 14 mg/dL (ref 6–20)
CO2: 23 mmol/L (ref 22–32)
Calcium: 9.1 mg/dL (ref 8.9–10.3)
Chloride: 103 mmol/L (ref 101–111)
Creatinine, Ser: 0.66 mg/dL (ref 0.44–1.00)
GFR calc Af Amer: >60 mL/min (ref >60)
GFR calc non Af Amer: >60 mL/min (ref >60)
GLUCOSE: 145 mg/dL — AB (ref 65–99)
POTASSIUM: 3.6 mmol/L (ref 3.5–5.1)
Sodium: 136 mmol/L (ref 135–145)
Total Bilirubin: 0.3 mg/dL (ref 0.3–1.2)
Total Protein: 8 g/dL (ref 6.5–8.1)

## 2014-12-31 LAB — URINALYSIS, ROUTINE W REFLEX MICROSCOPIC
BILIRUBIN URINE: NEGATIVE
Glucose, UA: NEGATIVE mg/dL
KETONES UR: NEGATIVE mg/dL
Nitrite: NEGATIVE
Protein, ur: NEGATIVE mg/dL
SPECIFIC GRAVITY, URINE: 1.025 (ref 1.005–1.030)
UROBILINOGEN UA: 0.2 mg/dL (ref 0.0–1.0)
pH: 5.5 (ref 5.0–8.0)

## 2014-12-31 LAB — URINE MICROSCOPIC-ADD ON

## 2014-12-31 LAB — PREGNANCY, URINE: Preg Test, Ur: NEGATIVE

## 2014-12-31 LAB — CBC WITH DIFFERENTIAL/PLATELET
BASOS ABS: 0.1 10*3/uL (ref 0.0–0.1)
Basophils Relative: 1 % (ref 0–1)
Eosinophils Absolute: 0.3 10*3/uL (ref 0.0–0.7)
Eosinophils Relative: 2 % (ref 0–5)
HCT: 45.2 % (ref 36.0–46.0)
Hemoglobin: 15.7 g/dL — ABNORMAL HIGH (ref 12.0–15.0)
LYMPHS ABS: 3.4 10*3/uL (ref 0.7–4.0)
LYMPHS PCT: 24 % (ref 12–46)
MCH: 30 pg (ref 26.0–34.0)
MCHC: 34.7 g/dL (ref 30.0–36.0)
MCV: 86.3 fL (ref 78.0–100.0)
Monocytes Absolute: 0.8 10*3/uL (ref 0.1–1.0)
Monocytes Relative: 6 % (ref 3–12)
NEUTROS PCT: 67 % (ref 43–77)
Neutro Abs: 9.6 10*3/uL — ABNORMAL HIGH (ref 1.7–7.7)
Platelets: 357 10*3/uL (ref 150–400)
RBC: 5.24 MIL/uL — ABNORMAL HIGH (ref 3.87–5.11)
RDW: 13.6 % (ref 11.5–15.5)
WBC: 14.2 10*3/uL — AB (ref 4.0–10.5)

## 2014-12-31 LAB — LACTIC ACID, PLASMA
LACTIC ACID, VENOUS: 0.9 mmol/L (ref 0.5–2.0)
Lactic Acid, Venous: 1.4 mmol/L (ref 0.5–2.0)

## 2014-12-31 LAB — LIPASE, BLOOD: Lipase: 18 U/L — ABNORMAL LOW (ref 22–51)

## 2014-12-31 MED ORDER — SODIUM CHLORIDE 0.9 % IV SOLN
INTRAVENOUS | Status: DC
  Administered 2014-12-31: 19:00:00 via INTRAVENOUS

## 2014-12-31 MED ORDER — ONDANSETRON HCL 8 MG PO TABS: 8.0000 mg | ORAL_TABLET | Freq: Three times a day (TID) | ORAL | Status: DC | PRN

## 2014-12-31 MED ORDER — MAGIC MOUTHWASH W/LIDOCAINE
5.0000 mL | Freq: Three times a day (TID) | ORAL | Status: DC
  Administered 2014-12-31: 5 mL via ORAL
  Filled 2014-12-31 (×5): qty 5

## 2014-12-31 MED ORDER — MAGIC MOUTHWASH
ORAL | Status: AC
  Filled 2014-12-31: qty 5

## 2014-12-31 MED ORDER — PROMETHAZINE HCL 25 MG RE SUPP: 25.0000 mg | Freq: Four times a day (QID) | RECTAL | Status: DC | PRN

## 2014-12-31 MED ORDER — ONDANSETRON HCL 4 MG/2ML IJ SOLN
4.0000 mg | Freq: Once | INTRAMUSCULAR | Status: AC
  Administered 2014-12-31: 4 mg via INTRAVENOUS
  Filled 2014-12-31: qty 2

## 2014-12-31 MED ORDER — MAGIC MOUTHWASH: 5.0000 mL | Freq: Four times a day (QID) | ORAL | Status: DC | PRN

## 2014-12-31 MED ORDER — SODIUM CHLORIDE 0.9 % IV BOLUS (SEPSIS)
1000.0000 mL | Freq: Once | INTRAVENOUS | Status: AC
  Administered 2014-12-31: 1000 mL via INTRAVENOUS

## 2014-12-31 MED ORDER — MORPHINE SULFATE (PF) 4 MG/ML IV SOLN
4.0000 mg | INTRAVENOUS | Status: AC | PRN
  Administered 2014-12-31 (×2): 4 mg via INTRAVENOUS
  Filled 2014-12-31 (×2): qty 1

## 2014-12-31 MED ORDER — PROMETHAZINE HCL 25 MG/ML IJ SOLN
25.0000 mg | Freq: Four times a day (QID) | INTRAMUSCULAR | Status: DC | PRN
  Administered 2014-12-31: 25 mg via INTRAVENOUS
  Filled 2014-12-31: qty 1

## 2014-12-31 MED ORDER — AMOXICILLIN-POT CLAVULANATE 875-125 MG PO TABS: 1.0000 | ORAL_TABLET | Freq: Two times a day (BID) | ORAL | Status: DC

## 2014-12-31 MED ORDER — ONDANSETRON HCL 4 MG/2ML IJ SOLN
4.0000 mg | INTRAMUSCULAR | Status: DC | PRN
  Administered 2014-12-31: 4 mg via INTRAVENOUS
  Filled 2014-12-31: qty 2

## 2014-12-31 NOTE — ED Notes (Signed)
Pt was seen on 9/2 and dx with diverticulitis and placed on antibiotics. Pt states symptoms are worse and states nausea and sore throat as well.

## 2014-12-31 NOTE — Consult Note (Addendum)
[facility] History and Physical  DENALY GATLING DUK:[id number] DOB: 03/30/1971 DOA: 12/31/2014  Referring physician: Dr. Thurnell Garbe - [facility] PCP: Rosita Fire, MD   Chief Complaint: Nausea  HPI: [name] is a 44 y.o. female  crampy abdominal pain 7 days. Seen in ED on 12/26/2014 and diagnosed with diverticulitis. Since that time patient has been compliant with her metronidazole and ciprofloxacin. States that she has had increasing bouts of nausea and a flushed feeling. This typically worse after taking the medicines. Uses Zofran 4 mg with limited benefit. Symptoms of crampy abdominal discomfort improved with bowel movements. Patient states she has multiple daily bowel movements. This seems to be getting better. Norco with some improvement in pain symptoms. Appetite is preserved. Patient states that her temperature has been as high as 99 at home.    Review of Systems:  Constitutional:  No weight loss, night sweats,  HEENT:  No headaches, Difficulty swallowing,Tooth/dental problems,Sore throat,  No sneezing, itching, ear ache, nasal congestion, post nasal drip,  Cardio-vascular:  No chest pain, Orthopnea, PND, swelling in lower extremities, anasarca, dizziness, palpitations  GI: Per HPI Resp:   No shortness of breath with exertion or at rest. No excess mucus, no productive cough, No non-productive cough, No coughing up of blood.No change in color of mucus.No wheezing.No chest wall deformity  Skin:  no rash or lesions.  GU:  no dysuria, change in color of urine, no urgency or frequency. No flank pain.  Musculoskeletal:   No joint pain or swelling. No decreased range of motion. No back pain.  Psych:  No change in mood or affect. No depression or anxiety. No memory loss.   Past Medical History  Diagnosis Date  . Sciatica   . Carpal tunnel syndrome, bilateral   . PONV (postoperative nausea and vomiting)   . Diverticulitis    Past Surgical History  Procedure Laterality  Date  . Right knee arthroscopy    . Knee surgery    . Tubal ligation    . Ablasion of uterus    . Cholecystectomy      [facility]  . Carpal tunnel release  11/10/2010    Procedure: CARPAL TUNNEL RELEASE;  Surgeon: Arther Abbott, MD;  Location: [facility];  Service: Orthopedics;  Laterality: Right;  . Colonoscopy N/A 03/28/2014    Procedure: COLONOSCOPY;  Surgeon: Rogene Houston, MD;  Location: [facility];  Service: Endoscopy;  Laterality: N/A;  730   Social History:  reports that she has been smoking Cigarettes.  She has been smoking about 0.00 packs per day for the past 26 years. She has never used smokeless tobacco. She reports that she does not drink alcohol or use illicit drugs.  Allergies  Allergen Reactions  . Hydrocodone Nausea And Vomiting    Can take need to take with medication for nausea and vomitting    Family History  Problem Relation Age of Onset  . Cancer    . Diabetes    . Arthritis    . Asthma    . Diabetes type I Mother   . Hyperlipidemia Mother   . Diabetes type II Father   . Hypertension Father   . Hyperlipidemia Father   . Diabetes type II Brother   . Anesthesia problems Neg Hx   . Diabetes type II Brother      Prior to Admission medications   Medication Sig Start Date End Date Taking? Authorizing Provider  ciprofloxacin (CIPRO) 500 MG tablet Take 1 tablet (500 mg total)  by mouth 2 (two) times daily. One po bid x 7 days 12/26/14  Yes Merrily Pew, MD  docusate sodium (COLACE) 100 MG capsule Take 1 capsule (100 mg total) by mouth every 12 (twelve) hours. While on narcotic pain medicine. 12/26/14  Yes Merrily Pew, MD  HYDROcodone-acetaminophen (NORCO) 5-325 MG per tablet Take 1 tablet by mouth every 6 (six) hours as needed for severe pain. 12/26/14  Yes Merrily Pew, MD  ibuprofen (ADVIL,MOTRIN) 200 MG tablet Take 800 mg by mouth 2 (two) times daily as needed. For pain   Yes Historical Provider, MD  metroNIDAZOLE (FLAGYL) 500 MG tablet Take 1 tablet (500 mg total) by  mouth 2 (two) times daily. One po bid x 7 days 12/26/14  Yes Merrily Pew, MD  ondansetron (ZOFRAN ODT) 8 MG disintegrating tablet 8mg  ODT q4 hours prn nausea Patient taking differently: Take 8 mg by mouth every 4 (four) hours as needed for nausea or vomiting.  12/26/14  Yes Merrily Pew, MD   Physical Exam: Filed Vitals:   12/31/14 1850 12/31/14 1900 12/31/14 1930 12/31/14 2000  BP:  134/68 123/55 111/66  Pulse:  88 72 66  Temp: 99.1 F (37.3 C)     TempSrc: Oral     Resp:      Height:      Weight:      SpO2:  100% 98% 97%    Wt Readings from Last 3 Encounters:  12/31/14 99.791 kg (220 lb)  12/26/14 99.791 kg (220 lb)  03/28/14 104.327 kg (230 lb)    General:  Appears calm and comfortable Eyes:  PERRL, normal lids, irises & conjunctiva Neck:  no LAD, masses or thyromegaly Cardiovascular:  RRR, no m/r/g. No LE edema. Telemetry:  SR, no arrhythmias  Respiratory:  CTA bilaterally, no w/r/r. Normal respiratory effort. Abdomen:  Normal active bowel sounds, palpation throughout abdomen without any discomfort noted by patient. Nondistended. Skin:  no rash or induration seen on limited exam Musculoskeletal:  grossly normal tone BUE/BLE Psychiatric:  grossly normal mood and affect, speech fluent and appropriate Neurologic:  grossly non-focal.          Labs on Admission:  Basic Metabolic Panel:  Recent Labs Lab 12/26/14 0751 12/31/14 1836  NA 131* 136  K 3.8 3.6  CL 102 103  CO2 22 23  GLUCOSE 256* 145*  BUN 11 14  CREATININE 0.64 0.66  CALCIUM 9.2 9.1   Liver Function Tests:  Recent Labs Lab 12/26/14 0751 12/31/14 1836  AST 17 21  ALT 30 36  ALKPHOS 87 85  BILITOT 0.8 0.3  PROT 7.5 8.0  ALBUMIN 4.0 4.3    Recent Labs Lab 12/31/14 1836  LIPASE 18*   No results for input(s): AMMONIA in the last 168 hours. CBC:  Recent Labs Lab 12/26/14 0751 12/31/14 1836  WBC 15.3* 14.2*  NEUTROABS  --  9.6*  HGB 15.1* 15.7*  HCT 43.2 45.2  MCV 86.4 86.3  PLT 319  357   Cardiac Enzymes: No results for input(s): CKTOTAL, CKMB, CKMBINDEX, TROPONINI in the last 168 hours.  BNP (last 3 results) No results for input(s): BNP in the last 8760 hours.  ProBNP (last 3 results) No results for input(s): PROBNP in the last 8760 hours.  CBG: No results for input(s): GLUCAP in the last 168 hours.  Radiological Exams on Admission: Dg Abd Acute W/chest  12/31/2014   CLINICAL DATA:  Low abdominal pain  EXAM: DG ABDOMEN ACUTE W/ 1V CHEST  COMPARISON:  CT  12/26/2014  FINDINGS: There is no evidence of dilated bowel loops or free intraperitoneal air. No radiopaque calculi or other significant radiographic abnormality is seen. Heart size and mediastinal contours are within normal limits. Both lungs are clear but mildly hypo aerated with crowding of the bronchovascular markings. Cholecystectomy clips are noted.  IMPRESSION: Negative abdominal radiographs.  No acute cardiopulmonary disease.   Electronically Signed   By: Conchita Paris M.D.   On: 12/31/2014 19:56      Assessment/Plan Principal Problem:   Nausea Active Problems:   Diverticulitis   Hyperglycemia   Medication reaction   Consulted by ED physician to evaluate patient due to ongoing symptoms of diverticulitis while on ciprofloxacin and metronidazole.  Of note I feel that the medications are working but are causing the patient's ongoing persistent symptoms of nausea and general discomfort. Recommending the patient be changed from ciprofloxacin and metronidazole to Augmentin. I would also recommend adding phenergan to her home regimen and  increasing her Zofran dose to 8 mg at home. Patient should follow-up with her primary care physician Dr. Legrand Rams in the next 1-2 days. Of note I have ordered an additional 4 mg of Zofran, 25 mg of Phenergan, and a 1 L bolus of normal saline. Patient also said multiple very elevated glucose levels (a size 256) and would recommend patient have gout patient follow-up for  diabetes including an A1c check. Some elevation is to be expected given her illness but her levels of thin to high to not check. Additionally I would recommend the pt be discharged on magic mouthwash for treatment of thrush   Family Communication: friend Disposition Plan: Discharge to home  MERRELL, Grayling Congress, MD Family Medicine [facility] [email or web address] Password [email or web address]

## 2014-12-31 NOTE — ED Notes (Signed)
Pt reports of no relief post zofran.

## 2014-12-31 NOTE — ED Provider Notes (Signed)
CSN: [id number]     Arrival date & time 12/31/14  1732 History   First MD Initiated Contact with Patient 12/31/14 1803     Chief Complaint  Patient presents with  . Abdominal Pain      HPI Pt was seen at 1825.  Per pt, c/o gradual onset and persistence of constant left sided abd "pain" for the past 5 days.  Has been associated with nausea and dry heaves.  Describes the abd pain as "cramping" and "aching." Pt was evaluated in the ED 5 days ago for same, dx diverticulitis, rx cipro, flagyl, zofran and norco. Pt states "it's not working and now I have thrush on my tongue." Endorses home fevers to "99."  Denies diarrhea, no fevers, no back pain, no rash, no CP/SOB, no black or blood in stools.       Past Medical History  Diagnosis Date  . Sciatica   . Carpal tunnel syndrome, bilateral   . PONV (postoperative nausea and vomiting)   . Diverticulitis    Past Surgical History  Procedure Laterality Date  . Right knee arthroscopy    . Knee surgery    . Tubal ligation    . Ablasion of uterus    . Cholecystectomy      [facility]  . Carpal tunnel release  11/10/2010    Procedure: CARPAL TUNNEL RELEASE;  Surgeon: Arther Abbott, MD;  Location: [facility];  Service: Orthopedics;  Laterality: Right;  . Colonoscopy N/A 03/28/2014    Procedure: COLONOSCOPY;  Surgeon: Rogene Houston, MD;  Location: [facility];  Service: Endoscopy;  Laterality: N/A;  730   Family History  Problem Relation Age of Onset  . Cancer    . Diabetes    . Arthritis    . Asthma    . Diabetes type I Mother   . Hyperlipidemia Mother   . Diabetes type II Father   . Hypertension Father   . Hyperlipidemia Father   . Diabetes type II Brother   . Anesthesia problems Neg Hx   . Diabetes type II Brother    Social History  Substance Use Topics  . Smoking status: Current Every Day Smoker -- 0.00 packs/day for 26 years    Types: Cigarettes  . Smokeless tobacco: Never Used  . Alcohol Use: No    Review of Systems ROS:  Statement: All systems negative except as marked or noted in the HPI; Constitutional: Negative for fever and chills. ; ; Eyes: Negative for eye pain, redness and discharge. ; ; ENMT: +thrush on tongue. Negative for ear pain, hoarseness, nasal congestion, sinus pressure and sore throat. ; ; Cardiovascular: Negative for chest pain, palpitations, diaphoresis, dyspnea and peripheral edema. ; ; Respiratory: Negative for cough, wheezing and stridor. ; ; Gastrointestinal: +nausea, abd pain. Negative for diarrhea, blood in stool, hematemesis, jaundice and rectal bleeding. . ; ; Genitourinary: Negative for dysuria, flank pain and hematuria. ; ; Musculoskeletal: Negative for back pain and neck pain. Negative for swelling and trauma.; ; Skin: Negative for pruritus, rash, abrasions, blisters, bruising and skin lesion.; ; Neuro: Negative for headache, lightheadedness and neck stiffness. Negative for weakness, altered level of consciousness , altered mental status, extremity weakness, paresthesias, involuntary movement, seizure and syncope.      Allergies  Hydrocodone  Home Medications   Prior to Admission medications   Medication Sig Start Date End Date Taking? Authorizing Provider  ciprofloxacin (CIPRO) 500 MG tablet Take 1 tablet (500 mg total) by mouth 2 (two) times  daily. One po bid x 7 days 12/26/14  Yes Merrily Pew, MD  docusate sodium (COLACE) 100 MG capsule Take 1 capsule (100 mg total) by mouth every 12 (twelve) hours. While on narcotic pain medicine. 12/26/14  Yes Merrily Pew, MD  HYDROcodone-acetaminophen (NORCO) 5-325 MG per tablet Take 1 tablet by mouth every 6 (six) hours as needed for severe pain. 12/26/14  Yes Merrily Pew, MD  ibuprofen (ADVIL,MOTRIN) 200 MG tablet Take 800 mg by mouth 2 (two) times daily as needed. For pain   Yes Historical Provider, MD  metroNIDAZOLE (FLAGYL) 500 MG tablet Take 1 tablet (500 mg total) by mouth 2 (two) times daily. One po bid x 7 days 12/26/14  Yes Merrily Pew, MD   ondansetron (ZOFRAN ODT) 8 MG disintegrating tablet 8mg  ODT q4 hours prn nausea Patient taking differently: Take 8 mg by mouth every 4 (four) hours as needed for nausea or vomiting.  12/26/14  Yes Jason Mesner, MD   BP 144/84 mmHg  Pulse 87  Temp(Src) 98.2 F (36.8 C) (Oral)  Resp 16  Ht 5\' 5"  (1.651 m)  Wt 220 lb (99.791 kg)  BMI 36.61 kg/m2  SpO2 98% Physical Exam  1830: Physical examination:  Nursing notes reviewed; Vital signs and O2 SAT reviewed;  Constitutional: Well developed, Well nourished, Well hydrated, In no acute distress; Head:  Normocephalic, atraumatic; Eyes: EOMI, PERRL, No scleral icterus; ENMT: +thrush on tongue. Mouth and pharynx normal, Mucous membranes moist; Neck: Supple, Full range of motion, No lymphadenopathy; Cardiovascular: Regular rate and rhythm, No murmur, rub, or gallop; Respiratory: Breath sounds clear & equal bilaterally, No rales, rhonchi, wheezes.  Speaking full sentences with ease, Normal respiratory effort/excursion; Chest: Nontender, Movement normal; Abdomen: Soft, +very mild LLQ tenderness to palp. No rebound or guarding. Nondistended, Normal bowel sounds; Genitourinary: No CVA tenderness; Extremities: Pulses normal, No tenderness, No edema, No calf edema or asymmetry.; Neuro: AA&Ox3, Major CN grossly intact.  Speech clear. No gross focal motor or sensory deficits in extremities.; Skin: Color normal, Warm, Dry.   ED Course  Procedures (including critical care time) Labs Review   Imaging Review  I have personally reviewed and evaluated these images and lab results as part of my medical decision-making.   EKG Interpretation None      MDM  MDM Reviewed: previous chart, nursing note and vitals Reviewed previous: labs and CT scan Interpretation: labs and x-ray      Results for orders placed or performed during the hospital encounter of 12/31/14  Comprehensive metabolic panel  Result Value Ref Range   Sodium 136 135 - 145 mmol/L    Potassium 3.6 3.5 - 5.1 mmol/L   Chloride 103 101 - 111 mmol/L   CO2 23 22 - 32 mmol/L   Glucose, Bld 145 (H) 65 - 99 mg/dL   BUN 14 6 - 20 mg/dL   Creatinine, Ser 0.66 0.44 - 1.00 mg/dL   Calcium 9.1 8.9 - 10.3 mg/dL   Total Protein 8.0 6.5 - 8.1 g/dL   Albumin 4.3 3.5 - 5.0 g/dL   AST 21 15 - 41 U/L   ALT 36 14 - 54 U/L   Alkaline Phosphatase 85 38 - 126 U/L   Total Bilirubin 0.3 0.3 - 1.2 mg/dL   GFR calc non Af Amer >60 >60 mL/min   GFR calc Af Amer >60 >60 mL/min   Anion gap 10 5 - 15  Lipase, blood  Result Value Ref Range   Lipase 18 (L) 22 - 51  U/L  Lactic acid, plasma  Result Value Ref Range   Lactic Acid, Venous 1.4 0.5 - 2.0 mmol/L  Lactic acid, plasma  Result Value Ref Range   Lactic Acid, Venous 0.9 0.5 - 2.0 mmol/L  CBC with Differential  Result Value Ref Range   WBC 14.2 (H) 4.0 - 10.5 K/uL   RBC 5.24 (H) 3.87 - 5.11 MIL/uL   Hemoglobin 15.7 (H) 12.0 - 15.0 g/dL   HCT 45.2 36.0 - 46.0 %   MCV 86.3 78.0 - 100.0 fL   MCH 30.0 26.0 - 34.0 pg   MCHC 34.7 30.0 - 36.0 g/dL   RDW 13.6 11.5 - 15.5 %   Platelets 357 150 - 400 K/uL   Neutrophils Relative % 67 43 - 77 %   Neutro Abs 9.6 (H) 1.7 - 7.7 K/uL   Lymphocytes Relative 24 12 - 46 %   Lymphs Abs 3.4 0.7 - 4.0 K/uL   Monocytes Relative 6 3 - 12 %   Monocytes Absolute 0.8 0.1 - 1.0 K/uL   Eosinophils Relative 2 0 - 5 %   Eosinophils Absolute 0.3 0.0 - 0.7 K/uL   Basophils Relative 1 0 - 1 %   Basophils Absolute 0.1 0.0 - 0.1 K/uL   WBC Morphology ATYPICAL LYMPHOCYTES   Pregnancy, urine  Result Value Ref Range   Preg Test, Ur NEGATIVE NEGATIVE  Urinalysis, Routine w reflex microscopic  Result Value Ref Range   Color, Urine YELLOW YELLOW   APPearance HAZY (A) CLEAR   Specific Gravity, Urine 1.025 1.005 - 1.030   pH 5.5 5.0 - 8.0   Glucose, UA NEGATIVE NEGATIVE mg/dL   Hgb urine dipstick TRACE (A) NEGATIVE   Bilirubin Urine NEGATIVE NEGATIVE   Ketones, ur NEGATIVE NEGATIVE mg/dL   Protein, ur NEGATIVE  NEGATIVE mg/dL   Urobilinogen, UA 0.2 0.0 - 1.0 mg/dL   Nitrite NEGATIVE NEGATIVE   Leukocytes, UA MODERATE (A) NEGATIVE  Urine microscopic-add on  Result Value Ref Range   Squamous Epithelial / LPF MANY (A) RARE   WBC, UA 11-20 <3 WBC/hpf   RBC / HPF 3-6 <3 RBC/hpf   Bacteria, UA MANY (A) RARE   Ct Abdomen Pelvis W Contrast 12/26/2014   CLINICAL DATA:  44 year old with lower abdominal pain and cramping. Diarrhea and nausea for 2 days.  EXAM: CT ABDOMEN AND PELVIS WITH CONTRAST  TECHNIQUE: Multidetector CT imaging of the abdomen and pelvis was performed using the standard protocol following bolus administration of intravenous contrast.  CONTRAST:  100 mL Omnipaque 300  COMPARISON:  03/05/2014  FINDINGS: Lung bases are clear.  Negative for free air.  Low-attenuation of the liver is suggestive for hepatic steatosis. No focal liver lesion. The gallbladder has been removed. Portal venous system is patent.  Normal appearance of the pancreas, spleen and adrenal glands. Normal appearance of both kidneys. Normal appearance of the urinary bladder. No acute abnormality to the stomach or duodenum.  Again noted are small periaortic lymph nodes. Atherosclerotic calcifications involving the distal abdominal aorta and iliac arteries without aneurysm.  There is inflammation surrounding the sigmoid colon on sequence 2, image 68. Small amount of fluid in the pelvis. Inflammatory changes involving the sigmoid mesocolon. Small colonic diverticular are present at this area of inflammation. No evidence for an abscess collection.  No acute abnormality involving the uterus or adnexal tissue. There may be a small hyperdense or peripherally enhancing follicle in the right ovary.  Normal appearance of the appendix. Normal appearance of small  bowel.  No acute bone abnormality.  IMPRESSION: Inflammation involving the sigmoid colon in the presence of colonic diverticula. Findings are most compatible with acute diverticulitis. A small  amount of free fluid in the pelvis but no evidence for an abscess.  Hepatic steatosis.   Electronically Signed   By: Markus Daft M.D.   On: 12/26/2014 09:09   Dg Abd Acute W/chest 12/31/2014   CLINICAL DATA:  Low abdominal pain  EXAM: DG ABDOMEN ACUTE W/ 1V CHEST  COMPARISON:  CT 12/26/2014  FINDINGS: There is no evidence of dilated bowel loops or free intraperitoneal air. No radiopaque calculi or other significant radiographic abnormality is seen. Heart size and mediastinal contours are within normal limits. Both lungs are clear but mildly hypo aerated with crowding of the bronchovascular markings. Cholecystectomy clips are noted.  IMPRESSION: Negative abdominal radiographs.  No acute cardiopulmonary disease.   Electronically Signed   By: Conchita Paris M.D.   On: 12/31/2014 19:56    2055:  WBC improving. Remains afebrile. Udip contaminated. Pt insistent regarding need for "admission because I'm not better."   T/C to [facility] Dr. Marily Memos, case discussed, including:  HPI, pertinent PM/SHx, VS/PE, dx testing, ED course and treatment:  Agreeable to come to ED for evaluation.   2135:  [facility] Dr. Marily Memos has evaluated pt: he has ordered phenergan and IVF bolus, states pt does not meet inpt criteria at this time and can be d/c with increase in zofran dose, rx phenergan, rx magic mouthwash, change abx to Augmentin, OTC probiotics, f/u with [facility] Dr. Legrand Rams in the next 2 days. Pt is agreeable with this plan. Will d/c stable.   Francine Graven, DO 01/02/15 2151

## 2014-12-31 NOTE — ED Notes (Signed)
AC called for magic mouth wash.

## 2014-12-31 NOTE — Discharge Instructions (Signed)
°Emergency Department Resource Guide °1) Find a Doctor and Pay Out of Pocket °Although you won't have to find out who is covered by your insurance plan, it is a good idea to ask around and get recommendations. You will then need to call the office and see if the doctor you have chosen will accept you as a new patient and what types of options they offer for patients who are self-pay. Some doctors offer discounts or will set up payment plans for their patients who do not have insurance, but you will need to ask so you aren't surprised when you get to your appointment. ° °2) Contact Your Local Health Department °Not all health departments have doctors that can see patients for sick visits, but many do, so it is worth a call to see if yours does. If you don't know where your local health department is, you can check in your phone book. The CDC also has a tool to help you locate your state's health department, and many state websites also have listings of all of their local health departments. ° °3) Find a Walk-in Clinic °If your illness is not likely to be very severe or complicated, you may want to try a walk in clinic. These are popping up all over the country in pharmacies, drugstores, and shopping centers. They're usually staffed by nurse practitioners or physician assistants that have been trained to treat common illnesses and complaints. They're usually fairly quick and inexpensive. However, if you have serious medical issues or chronic medical problems, these are probably not your best option. ° °No Primary Care Doctor: °- Call [facility] Connect at  832-8000 - they can help you locate a primary care doctor that  accepts your insurance, provides certain services, etc. °- Physician Referral Service- 1-800-533-3463 ° °Chronic Pain Problems: °Organization         Address  Phone   Notes  ° [facility]  ([phone]) [phone] Patients need to be referred by their primary care doctor.  ° °Medication  Assistance: °Organization         Address  Phone   Notes  °[place] [facility] [address] °[place], [place] [address] (336) 641-8030 --Must be a resident of [place] °-- Must have NO insurance coverage whatsoever (no Medicaid/ Medicare, etc.) °-- The pt. MUST have a primary care doctor that directs their care regularly and follows them in the community °  °[facility]  (866) 331-1348   °[employer]  (888) 892-1162   ° °Agencies that provide inexpensive medical care: °Organization         Address  Phone   Notes  °[facility]  (336) 832-8035   °[facility]    (336) 832-7272   °[facility] [address] °[place], [place] [address] (336) 832-4777   °[facility] [address], °[place] (336) 271-4999   °[facility]    (336) 373-0678   °[facility]    (336) 272-1050   °[facility] ° [address], [place] Phone:  (336) 832-4444, Fax:  (336) 832-4440 Hours of Operation:  9 am - 6 pm, M-F.  Also accepts Medicaid/Medicare and self-pay.  °[facility] ° [address], [place] Phone: (336) [phone], Fax: (336) [phone]. Hours of Operation:  8:30 am - 5:30 pm, M-F.  Also accepts Medicaid and self-pay.  °[facility] [place] [address], [place] Phone: (336) 878-6027   °[facility] [address], [place], [place] (336)723-1848, Ext. 123 Mondays & Thursdays: 7-9 AM.  First 15 patients are seen on a first come, first serve basis. °  ° °Medicaid-accepting [place] Providers: ° °Organization         Address  Phone   Notes  °[facility] [address], [place] (336) [phone] Also accepts self-pay patients.  °[facility] [address], [place] ° (336) [phone]   °[facility] [address], [place]  (336) 288-8857   °[facility] [address] [place] [address], [place] (336) 299-7000   °Ve[facility] Bland [address], [place]  ° (336) [phone] Only accepts [place] Access Medicaid patients after they have their name applied to their card.  ° °Self-Pay (no insurance) in [place]: ° °Organization         Address  Phone   Notes  °Sickle Cell Patients, [facility] [address], [place] (336) 832-1970   °[facility] [address], [place] (336) 832-4400   °[facility] ° [address],  (336) 992-4800   °[facility]/Dr. Osei-Bonsu ° [address] [place] [address], [place] or [address], [place] (336) [phone] Phone number for both [place] and [place] locations is the same.  °[facility] [address], [place] (336) [phone]   °[facility] [address] [place] [address], [place] or [address] (336) 852-7530 °(336) 878-2260   °[facility] [address], [place] (336) 350-1642, phone; (336) 294-5005, fax Sees patients 1st and 3rd Saturday of every month.  Must not qualify for public or private insurance (i.e. Medicaid, Medicare, [place] Health Choice, Veterans' Benefits) • Household income should be no more than 200% of the poverty level •The clinic cannot treat you if you are pregnant or think you are pregnant • Sexually transmitted diseases are not treated at the clinic.  ° ° °Dental Care: °Organization         Address  Phone  Notes  °[place] [facility] [address],  (336) 641-6152 Accepts children up to age 21 who are enrolled in Medicaid or [place] Health Choice; pregnant women with a Medicaid card; and children who have applied for Medicaid or [place] Health Choice, but were declined, whose parents can pay a reduced fee at time of service.  °[place]  [facility] [place]  [address], [place] (336) 641-7733 Accepts children up to age 21 who are enrolled in Medicaid or [place] Health Choice; pregnant women with a Medicaid card; and children who have applied for Medicaid or [place] Health Choice, but were declined, whose parents can pay a reduced fee at time of service.  °[facility] ° [address],  (336) 641-4533 Patients are seen by appointment only. Walk-ins are not accepted. [facility] will see patients 18 years of age and older. °Monday - Tuesday (8am-5pm) °Most Wednesdays (8:30-5pm) °$30 per visit, cash only  °[facility] ° [address], [place] (336) [phone] Patients are seen by appointment only. Walk-ins are not accepted. [facility] will see patients 18 years of age and older. °One   Wednesday Evening (Monthly: Volunteer Based).  $30 per visit, cash only  °[facility]  (919) [phone] for adults; Children under age 4, call Graduate Pediatric Dentistry at ([phone]) [phone]. Children aged 4-14, please call ([phone]) [phone] to request a pediatric application. ° Dental services are provided in all areas of dental care including fillings, crowns and bridges, complete and partial dentures, implants, gum treatment, root canals, and extractions. Preventive care is also provided. Treatment is provided to both adults and children. °Patients are selected via a lottery and there is often a waiting list. °  °[facility] [address], °[place] ° (336) 763-8833 [email or web address] °  °[facility] [address], [place], [place] (336)723-1848, Ext. 123 Second and Fourth Thursday of each month, opens at 6:30 AM; Clinic ends at 9 AM.  Patients are seen on a first-come first-served basis, and a limited number are seen during each clinic.  ° °[facility] ° [address], [place], [place] (336) [phone]    Eligibility Requirements °You must have lived in [place], [place], or [place] counties for at least the last three months. °  You cannot be eligible for state or federal sponsored healthcare insurance, including Veterans Administration, Medicaid, or Medicare. °  You generally cannot be eligible for healthcare insurance through your employer.  °  How to apply: °Eligibility screenings are held every Tuesday and Wednesday afternoon from 1:00 pm until 4:00 pm. You do not need an appointment for the interview!  °[facility] [address], [place], [place] 336-631-2330   °[facility]  336-342-8273   °[facility]  336-703-3100   °[facility]  336-570-6415   ° °Behavioral Health Resources in the Community: °Intensive Outpatient Programs °Organization         Address  Phone  Notes  °[facility] [address], [place], [place] 336-878-6098   °[facility] [address], [place], [place] 336-832-9800   °[facility] [address], [place], [place] ° 336-882-2125   °[facility] [address],  °[place], [place] 1-800-853-5163 or 336-641-4981   °Substance Abuse Resources °Organization         Address  Phone  Notes  °[facility]  336-882-2125   °[facility]  336-784-9470   °[facility]  336-285-9073   °[facility]  336-845-3988   °Residential & Outpatient Substance Abuse Program  1-800-659-3381   °Psychological Services °Organization         Address  Phone  Notes  ° Health  336- 832-9600   °[facility]  336- 378-7881   °[facility] [address], [place] 1-800-853-5163 or 336-641-4981   ° °Mobile Crisis Teams °Organization         Address  Phone  Notes  °[facility], Mobile Crisis Care Unit  1-877-626-1772   °Assertive °[facility] ° [address]  [place], [place] 336-834-9664   °Sharon DeEsch [address] °[place] 336-554-5454   ° °Self-Help/Support Groups °Organization         Address  Phone             Notes  °[facility] - variety of support groups  336- 373-1402 Call for more information  °Narcotics Anonymous (NA), [facility] [address], °[place]  2 meetings at this location  ° °  Residential Treatment Programs Organization         Address  Phone  Notes  [facility] [address]    [place]  1-2011888704   [facility]  [address] [place] [address], [place], [place]   [place], [place] (410)487-9582 Admissions: 8am-3pm M-F  [facility] [place] [address],    [place], [place] 903-009-2330   [facility] [address] [place], [place], [place]   [facility] [address],  [place], [place]   [facility] Intensive Outpatient [address], [place], [place]   [facility] [address]  [place], [place] 1-415-210-1303 or (253) 714-4002   [facility] [address] [place], [place] Accepts Medicaid  [facility] [address],  [place] 1-(615)846-3674 Substance Abuse/Addiction Treatment   [facility]         Address  Phone  Notes  [facility]  [phone]   Domenic Schwab, PhD [address] [place], [place]   (276)141-0464 or 507-426-5738   [facility] [place], [place] 228-371-5717   [facility] [address] [place], [place] (615)615-1379 Insurance/Medicaid/sponsorship through [facility] [address]                                    [place], [place] (716) 130-4992 [place] [address], [place] ([phone]    Dr. Adele Schilder  905-329-8606   [facility] Dept. 1) [address], [place] 2) [place] 3)  [address], [place] ([phone]) [phone]  [phone]   [place] (628) [phone] or 647-238-8042 (After Hours)      Take the prescriptions as directed. Stop taking the cipro and flagyl (antibiotics). Take the zofran (anti-emetic) at the new dose (8mg ).  Also start to take over the counter probiotics, as directed on packaging, for the next several weeks.  Call your regular medical doctor tomorrow to schedule a follow up appointment within the next 2 days.  Return to the Emergency Department immediately sooner if worsening.

## 2014-12-31 NOTE — ED Notes (Signed)
Patient states her pain is not much better. Offered to give patient something for pain. States she doesn't want anything at this time.

## 2015-01-08 ENCOUNTER — Emergency Department (HOSPITAL_COMMUNITY): Payer: BLUE CROSS/BLUE SHIELD

## 2015-01-08 ENCOUNTER — Encounter (HOSPITAL_COMMUNITY): Payer: Self-pay

## 2015-01-08 ENCOUNTER — Inpatient Hospital Stay (HOSPITAL_COMMUNITY)
Admission: EM | Admit: 2015-01-08 | Discharge: 2015-01-11 | DRG: 392 | Disposition: A | Discharge status: Home / Self Care | Payer: BLUE CROSS/BLUE SHIELD | Attending: Family Medicine | Admitting: Family Medicine

## 2015-01-08 DIAGNOSIS — E669 Obesity, unspecified: Secondary | ICD-10-CM | POA: Diagnosis present

## 2015-01-08 DIAGNOSIS — K5792 Diverticulitis of intestine, part unspecified, without perforation or abscess without bleeding: Secondary | ICD-10-CM

## 2015-01-08 DIAGNOSIS — K5720 Diverticulitis of large intestine with perforation and abscess without bleeding: Secondary | ICD-10-CM | POA: Diagnosis present

## 2015-01-08 DIAGNOSIS — D72829 Elevated white blood cell count, unspecified: Secondary | ICD-10-CM | POA: Diagnosis present

## 2015-01-08 DIAGNOSIS — Z720 Tobacco use: Secondary | ICD-10-CM | POA: Diagnosis not present

## 2015-01-08 DIAGNOSIS — K5780 Diverticulitis of intestine, part unspecified, with perforation and abscess without bleeding: Secondary | ICD-10-CM | POA: Diagnosis present

## 2015-01-08 DIAGNOSIS — Z6837 Body mass index (BMI) 37.0-37.9, adult: Secondary | ICD-10-CM | POA: Diagnosis not present

## 2015-01-08 DIAGNOSIS — B370 Candidal stomatitis: Secondary | ICD-10-CM

## 2015-01-08 DIAGNOSIS — K5732 Diverticulitis of large intestine without perforation or abscess without bleeding: Secondary | ICD-10-CM | POA: Diagnosis not present

## 2015-01-08 DIAGNOSIS — F17210 Nicotine dependence, cigarettes, uncomplicated: Secondary | ICD-10-CM | POA: Diagnosis present

## 2015-01-08 DIAGNOSIS — Z8249 Family history of ischemic heart disease and other diseases of the circulatory system: Secondary | ICD-10-CM

## 2015-01-08 DIAGNOSIS — K6289 Other specified diseases of anus and rectum: Secondary | ICD-10-CM | POA: Diagnosis present

## 2015-01-08 DIAGNOSIS — Z825 Family history of asthma and other chronic lower respiratory diseases: Secondary | ICD-10-CM | POA: Diagnosis not present

## 2015-01-08 DIAGNOSIS — Z9049 Acquired absence of other specified parts of digestive tract: Secondary | ICD-10-CM | POA: Diagnosis present

## 2015-01-08 DIAGNOSIS — Z833 Family history of diabetes mellitus: Secondary | ICD-10-CM

## 2015-01-08 DIAGNOSIS — R109 Unspecified abdominal pain: Secondary | ICD-10-CM | POA: Diagnosis not present

## 2015-01-08 LAB — COMPREHENSIVE METABOLIC PANEL
ALBUMIN: 4.5 g/dL (ref 3.5–5.0)
ALT: 45 U/L (ref 14–54)
AST: 29 U/L (ref 15–41)
Alkaline Phosphatase: 96 U/L (ref 38–126)
Anion gap: 10 (ref 5–15)
BUN: 13 mg/dL (ref 6–20)
CHLORIDE: 101 mmol/L (ref 101–111)
CO2: 24 mmol/L (ref 22–32)
CREATININE: 0.63 mg/dL (ref 0.44–1.00)
Calcium: 9.3 mg/dL (ref 8.9–10.3)
GFR calc Af Amer: >60 mL/min (ref >60)
GFR calc non Af Amer: >60 mL/min (ref >60)
Glucose, Bld: 140 mg/dL — ABNORMAL HIGH (ref 65–99)
Potassium: 4.1 mmol/L (ref 3.5–5.1)
SODIUM: 135 mmol/L (ref 135–145)
Total Bilirubin: 0.6 mg/dL (ref 0.3–1.2)
Total Protein: 8.4 g/dL — ABNORMAL HIGH (ref 6.5–8.1)

## 2015-01-08 LAB — CBC WITH DIFFERENTIAL/PLATELET
BASOS ABS: 0.1 10*3/uL (ref 0.0–0.1)
BASOS PCT: 1 %
EOS ABS: 0.2 10*3/uL (ref 0.0–0.7)
EOS PCT: 1 %
HCT: 45.8 % (ref 36.0–46.0)
Hemoglobin: 16.1 g/dL — ABNORMAL HIGH (ref 12.0–15.0)
LYMPHS PCT: 24 %
Lymphs Abs: 3.4 10*3/uL (ref 0.7–4.0)
MCH: 30.3 pg (ref 26.0–34.0)
MCHC: 35.2 g/dL (ref 30.0–36.0)
MCV: 86.3 fL (ref 78.0–100.0)
Monocytes Absolute: 0.6 10*3/uL (ref 0.1–1.0)
Monocytes Relative: 4 %
Neutro Abs: 10.2 10*3/uL — ABNORMAL HIGH (ref 1.7–7.7)
Neutrophils Relative %: 70 %
PLATELETS: 416 10*3/uL — AB (ref 150–400)
RBC: 5.31 MIL/uL — AB (ref 3.87–5.11)
RDW: 13.5 % (ref 11.5–15.5)
WBC: 14.5 10*3/uL — AB (ref 4.0–10.5)

## 2015-01-08 LAB — URINALYSIS, ROUTINE W REFLEX MICROSCOPIC
Bilirubin Urine: NEGATIVE
GLUCOSE, UA: NEGATIVE mg/dL
Ketones, ur: NEGATIVE mg/dL
LEUKOCYTES UA: NEGATIVE
NITRITE: NEGATIVE
PH: 6 (ref 5.0–8.0)
PROTEIN: NEGATIVE mg/dL
Specific Gravity, Urine: <1.005 — ABNORMAL LOW (ref 1.005–1.030)
Urobilinogen, UA: 0.2 mg/dL (ref 0.0–1.0)

## 2015-01-08 LAB — URINE MICROSCOPIC-ADD ON

## 2015-01-08 LAB — POC OCCULT BLOOD, ED: Fecal Occult Bld: NEGATIVE

## 2015-01-08 LAB — LIPASE, BLOOD: Lipase: 15 U/L — ABNORMAL LOW (ref 22–51)

## 2015-01-08 MED ORDER — SODIUM CHLORIDE 0.9 % IV BOLUS (SEPSIS)
1000.0000 mL | Freq: Once | INTRAVENOUS | Status: AC
  Administered 2015-01-08: 1000 mL via INTRAVENOUS

## 2015-01-08 MED ORDER — METRONIDAZOLE IN NACL 5-0.79 MG/ML-% IV SOLN: 500.0000 mg | Freq: Three times a day (TID) | INTRAVENOUS | Status: DC

## 2015-01-08 MED ORDER — CIPROFLOXACIN IN D5W 400 MG/200ML IV SOLN
400.0000 mg | Freq: Two times a day (BID) | INTRAVENOUS | Status: DC
  Administered 2015-01-09 – 2015-01-10 (×3): 400 mg via INTRAVENOUS
  Filled 2015-01-08 (×3): qty 200

## 2015-01-08 MED ORDER — IOHEXOL 300 MG/ML  SOLN
50.0000 mL | Freq: Once | INTRAMUSCULAR | Status: AC | PRN
Start: 2015-01-08 — End: 2015-01-08
  Administered 2015-01-08: 50 mL via ORAL

## 2015-01-08 MED ORDER — ONDANSETRON HCL 4 MG/2ML IJ SOLN
4.0000 mg | Freq: Once | INTRAMUSCULAR | Status: AC
  Administered 2015-01-08: 4 mg via INTRAVENOUS
  Filled 2015-01-08: qty 2

## 2015-01-08 MED ORDER — ACETAMINOPHEN 650 MG RE SUPP: 650.0000 mg | Freq: Four times a day (QID) | RECTAL | Status: DC | PRN

## 2015-01-08 MED ORDER — CIPROFLOXACIN IN D5W 400 MG/200ML IV SOLN
400.0000 mg | Freq: Once | INTRAVENOUS | Status: AC
  Administered 2015-01-08: 400 mg via INTRAVENOUS
  Filled 2015-01-08: qty 200

## 2015-01-08 MED ORDER — METRONIDAZOLE IN NACL 5-0.79 MG/ML-% IV SOLN
500.0000 mg | Freq: Three times a day (TID) | INTRAVENOUS | Status: DC
  Administered 2015-01-08 – 2015-01-10 (×5): 500 mg via INTRAVENOUS
  Filled 2015-01-08 (×5): qty 100

## 2015-01-08 MED ORDER — NYSTATIN 100000 UNIT/ML MT SUSP
5.0000 mL | Freq: Four times a day (QID) | OROMUCOSAL | Status: DC
  Administered 2015-01-08 – 2015-01-11 (×11): 500000 [IU] via ORAL
  Filled 2015-01-08 (×10): qty 5

## 2015-01-08 MED ORDER — SODIUM CHLORIDE 0.9 % IV SOLN
INTRAVENOUS | Status: DC
  Administered 2015-01-08: 16:00:00 via INTRAVENOUS

## 2015-01-08 MED ORDER — ACETAMINOPHEN 325 MG PO TABS: 650.0000 mg | ORAL_TABLET | Freq: Four times a day (QID) | ORAL | Status: DC | PRN

## 2015-01-08 MED ORDER — MORPHINE SULFATE (PF) 2 MG/ML IV SOLN
1.0000 mg | INTRAVENOUS | Status: DC | PRN
  Administered 2015-01-08 – 2015-01-10 (×6): 1 mg via INTRAVENOUS
  Filled 2015-01-08 (×6): qty 1

## 2015-01-08 MED ORDER — CIPROFLOXACIN IN D5W 400 MG/200ML IV SOLN: 400.0000 mg | Freq: Two times a day (BID) | INTRAVENOUS | Status: DC

## 2015-01-08 MED ORDER — MORPHINE SULFATE (PF) 4 MG/ML IV SOLN
4.0000 mg | Freq: Once | INTRAVENOUS | Status: AC
  Administered 2015-01-08: 4 mg via INTRAVENOUS
  Filled 2015-01-08: qty 1

## 2015-01-08 MED ORDER — NICOTINE 21 MG/24HR TD PT24
21.0000 mg | MEDICATED_PATCH | Freq: Every day | TRANSDERMAL | Status: DC
  Administered 2015-01-08 – 2015-01-11 (×4): 21 mg via TRANSDERMAL
  Filled 2015-01-08 (×4): qty 1

## 2015-01-08 MED ORDER — SODIUM CHLORIDE 0.9 % IV SOLN: INTRAVENOUS | Status: DC

## 2015-01-08 MED ORDER — IOHEXOL 300 MG/ML  SOLN
100.0000 mL | Freq: Once | INTRAMUSCULAR | Status: AC | PRN
  Administered 2015-01-08: 100 mL via INTRAVENOUS

## 2015-01-08 MED ORDER — ONDANSETRON HCL 4 MG/2ML IJ SOLN
4.0000 mg | Freq: Four times a day (QID) | INTRAMUSCULAR | Status: DC | PRN
  Administered 2015-01-08 – 2015-01-10 (×5): 4 mg via INTRAVENOUS
  Filled 2015-01-08 (×5): qty 2

## 2015-01-08 MED ORDER — OXYCODONE HCL 5 MG PO TABS
5.0000 mg | ORAL_TABLET | ORAL | Status: DC | PRN
Start: 2015-01-08 — End: 2015-01-09
  Administered 2015-01-08 – 2015-01-09 (×3): 5 mg via ORAL
  Filled 2015-01-08 (×3): qty 1

## 2015-01-08 MED ORDER — METRONIDAZOLE IN NACL 5-0.79 MG/ML-% IV SOLN
500.0000 mg | Freq: Once | INTRAVENOUS | Status: AC
  Administered 2015-01-08: 500 mg via INTRAVENOUS
  Filled 2015-01-08: qty 100

## 2015-01-08 NOTE — ED Notes (Signed)
Rounding on pt in waiting area. Explained delay to pt, offered blanket.

## 2015-01-08 NOTE — ED Notes (Signed)
Pt reports she has hx of diverticulitis, was seen here last Wednesday and finished ABX and pain is not improving. C/O diarrhea, nausea and rectal pain

## 2015-01-08 NOTE — H&P (Signed)
History and Physical  [name] ZOX:[id number] DOB: Sep 18, 1970 DOA: 01/08/2015  Referring physician: Ezequiel Essex, MD PCP: Rosita Fire, MD hasn't seen in years, does not plan to followup  Chief Complaint: abdominal pain   HPI:  51 yof with history of diverticulitis presented with complaints of left-sided abdominal pain, rectal pain, and nausea. She was recently treated with Cipro and Flagyl 9/2 then switched to Augmentin 9/7, which she completed 9/14, for her diverticulitis. While in the ED CT revealed persistent sigmoid diverticulitis with small abscess. Admitted for further evaluation and treatment.   Pain began approx 9/1, cramping, sharp, improved with abx, but never resolved. Seen in ED twice as above. Constant ache in abd and buttocks with periodic cramps of 10/10 intensity. Bowels moving, no blood. Nausea, but no vomiting. Eating until yesterday. Pain was intense yesterday and as had finished abx, came to ED for further evaluation. Also had thrush.  In the emergency department afebrile, VSS, not hypoxic  Pertinent labs: WBC 14.5, BMP unremarkable, except Lipase 15. Hgb 16.1, glucose 140, Platelets 416 Imaging: independently reviewed.CT A/P reveals persistent sigmoid diverticulitis with small abscess  Review of Systems:  Positive abdominal pain, nausea  Negative for fever, visual changes, rash, new muscle aches, chest pain, SOB, dysuria, bleeding, vomiting   Past Medical History  Diagnosis Date  . Sciatica   . Carpal tunnel syndrome, bilateral   . PONV (postoperative nausea and vomiting)   . Diverticulitis     Past Surgical History  Procedure Laterality Date  . Right knee arthroscopy    . Knee surgery    . Tubal ligation    . Ablasion of uterus    . Cholecystectomy      [facility]  . Carpal tunnel release  11/10/2010    Procedure: CARPAL TUNNEL RELEASE;  Surgeon: Arther Abbott, MD;  Location: [facility];  Service: Orthopedics;  Laterality: Right;  . Colonoscopy N/A  03/28/2014    Procedure: COLONOSCOPY;  Surgeon: Rogene Houston, MD;  Location: [facility];  Service: Endoscopy;  Laterality: N/A;  730    Social History:  reports that she has been smoking Cigarettes.  She has been smoking about 0.00 packs per day for the past 26 years. She has never used smokeless tobacco. She reports that she does not drink alcohol or use illicit drugs.   Allergies  Allergen Reactions  . Flagyl [Metronidazole] Nausea Only  . Hydrocodone Nausea And Vomiting    Can take need to take with medication for nausea and vomitting    Family History  Problem Relation Age of Onset  . Cancer    . Diabetes    . Arthritis    . Asthma    . Diabetes type I Mother   . Hyperlipidemia Mother   . Diabetes type II Father   . Hypertension Father   . Hyperlipidemia Father   . Diabetes type II Brother   . Anesthesia problems Neg Hx   . Diabetes type II Brother      Prior to Admission medications   Medication Sig Start Date End Date Taking? Authorizing Provider  docusate sodium (COLACE) 100 MG capsule Take 1 capsule (100 mg total) by mouth every 12 (twelve) hours. While on narcotic pain medicine. 12/26/14  Yes Merrily Pew, MD  ibuprofen (ADVIL,MOTRIN) 200 MG tablet Take 800 mg by mouth 2 (two) times daily as needed. For pain   Yes Historical Provider, MD  ondansetron (ZOFRAN) 8 MG tablet Take 1 tablet (8 mg total) by mouth every  8 (eight) hours as needed for nausea or vomiting. 12/31/14  Yes Francine Graven, DO  amoxicillin-clavulanate (AUGMENTIN) 875-125 MG per tablet Take 1 tablet by mouth every 12 (twelve) hours. For the next 7 days Patient not taking: Reported on 01/08/2015 12/31/14   Francine Graven, DO  HYDROcodone-acetaminophen (NORCO) 5-325 MG per tablet Take 1 tablet by mouth every 6 (six) hours as needed for severe pain. Patient not taking: Reported on 01/08/2015 12/26/14   Merrily Pew, MD  magic mouthwash SOLN Take 5 mLs by mouth 4 (four) times daily as needed for mouth  pain. Patient not taking: Reported on 01/08/2015 12/31/14   Francine Graven, DO  ondansetron (ZOFRAN ODT) 8 MG disintegrating tablet 8mg  ODT q4 hours prn nausea Patient not taking: Reported on 01/08/2015 12/26/14   Merrily Pew, MD  promethazine (PHENERGAN) 25 MG suppository Place 1 suppository (25 mg total) rectally every 6 (six) hours as needed for nausea or vomiting. Patient not taking: Reported on 01/08/2015 12/31/14   Francine Graven, DO   Physical Exam: Filed Vitals:   01/08/15 1040 01/08/15 1212 01/08/15 1304 01/08/15 1330  BP: 138/93 147/90 131/78 132/69  Pulse: 86 88 74 77  Temp: 98.2 F (36.8 C)     TempSrc: Oral     Resp: 14 18 16    Height: 5\' 5"  (1.651 m)     Weight: 99.791 kg (220 lb)     SpO2: 99% 98% 99% 98%     General: Appears calm and comfortable Eyes: PERRL, normal lids, irises   ENT: grossly normal hearing, lips & tongue; few white patches on buccal mucosa. Neck: appears normal Cardiovascular: RRR, no m/r/g. No LE edema. Respiratory: CTA bilaterally, no w/r/r. Normal respiratory effort. Abdomen: soft, ntnd Skin: no rash or induration noted Musculoskeletal: grossly normal tone BUE/BLE Psychiatric: grossly normal mood and affect, speech fluent and appropriate Neurologic: grossly non-focal.  Wt Readings from Last 3 Encounters:  01/08/15 99.791 kg (220 lb)  12/31/14 99.791 kg (220 lb)  12/26/14 99.791 kg (220 lb)    Labs on Admission:  Basic Metabolic Panel:  Recent Labs Lab 01/08/15 1210  NA 135  K 4.1  CL 101  CO2 24  GLUCOSE 140*  BUN 13  CREATININE 0.63  CALCIUM 9.3    Liver Function Tests:  Recent Labs Lab 01/08/15 1210  AST 29  ALT 45  ALKPHOS 96  BILITOT 0.6  PROT 8.4*  ALBUMIN 4.5    Recent Labs Lab 01/08/15 1210  LIPASE 15*   CBC:  Recent Labs Lab 01/08/15 1210  WBC 14.5*  NEUTROABS 10.2*  HGB 16.1*  HCT 45.8  MCV 86.3  PLT 416*    Radiological Exams on Admission: Ct Abdomen Pelvis W Contrast  01/08/2015    CLINICAL DATA:  History of diverticulitis, abdominal pain, rectal pain, not improving after antibiotics  EXAM: CT ABDOMEN AND PELVIS WITH CONTRAST  TECHNIQUE: Multidetector CT imaging of the abdomen and pelvis was performed using the standard protocol following bolus administration of intravenous contrast.  CONTRAST:  68mL OMNIPAQUE IOHEXOL 300 MG/ML SOLN, 169mL OMNIPAQUE IOHEXOL 300 MG/ML SOLN  COMPARISON:  12/26/2014  FINDINGS: Lung bases are unremarkable. Sagittal images of the spine shows mild degenerative changes thoracolumbar spine. Mild fatty infiltration of the liver. Status postcholecystectomy. No focal hepatic mass. The pancreas, spleen and adrenal glands are unremarkable.  Abdominal aorta is unremarkable.  Kidneys are symmetrical in size and enhancement. No hydronephrosis or hydroureter. No small bowel obstruction. No ascites or free air. No adenopathy. Normal appendix.  No pericecal inflammation. Again noted stranding of pericolonic fat in distal sigmoid colon mid pelvis. Findings are consistent with acute diverticulitis. There is a small diverticular abscess measures 1.7 by 1.9 cm. This is best seen in axial image 71. There is no evidence of air-fluid level.  No distal colonic obstruction. The uterus and adnexa are unremarkable. No ovarian mass. The urinary bladder is unremarkable. There is no inguinal adenopathy.  Delayed renal images shows bilateral renal symmetrical excretion. Bilateral visualized proximal ureter is unremarkable.  IMPRESSION: 1. Persistent inflammatory changes in distal sigmoid colon consistent with acute diverticulitis. There is small diverticular abscess axial image 69 and coronal image 71 measures 1.7 x 1.9 cm. No air-fluid level is noted. 2. Normal appendix.  No pericecal inflammation. 3. Status post cholecystectomy. 4. No hydronephrosis or hydroureter.   Electronically Signed   By: Lahoma Crocker M.D.   On: 01/08/2015 13:09      Principal Problem:   Acute diverticulitis Active  Problems:   Thrush, oral   Diverticulitis of intestine with abscess   Assessment/Plan 1. Acute diverticulitis failed outpatient treatment, complicated by abcess formation CT A/P. Hemodynamics stable. Appears nontoxic. 2. Tobacco use disorder   Plan admission for IV abx, pain control, antiemetics.  General surgery consultation, but EDP no surgery planned.  IR consultation for drain placement  Oral nystatin  Code Status: full code  DVT prophylaxis SCDs Family Communication: none present, pt alert and understands plan Disposition Plan/Anticipated LOS: Admit to medical bed  Time spent: 52 minutes  Bridget Hodgkins, MD  [facility] Pager [phone] 01/08/2015, 2:07 PM   By signing my name below, I, Rennis Harding attest that this documentation has been prepared under the direction and in the presence of Bridget Hodgkins, MD Electronically signed: Rennis Harding  01/08/2015

## 2015-01-08 NOTE — ED Provider Notes (Signed)
CSN: [id number]     Arrival date & time 01/08/15  1016 History   First MD Initiated Contact with Patient 01/08/15 1201     Chief Complaint  Patient presents with  . Abdominal Pain     (Consider location/radiation/quality/duration/timing/severity/associated sxs/prior Treatment) HPI Comments: Patient diagnosed with diverticulitis on September 2. It was initially treated with Cipro and Flagyl which was switched to Augmentin on September 7. She finished antibiotics yesterday. She reports ongoing left-sided abdominal pain, rectal pain, nausea, dry heaves. States the diarrhea has resolved but she still has tenesmus and urgency and the feeling that she needs to defecate. Denies any blood in the stool. Denies any fever. Denies any back pain, chest pain, shortness of breath. No blood in the stool. Finish her pain medication and is now out. She has not seen her primary care doctor. She still has a good appetite and wishes to eat. No previous episodes of diverticulitis.  The history is provided by the patient.    Past Medical History  Diagnosis Date  . Sciatica   . Carpal tunnel syndrome, bilateral   . PONV (postoperative nausea and vomiting)   . Diverticulitis    Past Surgical History  Procedure Laterality Date  . Right knee arthroscopy    . Knee surgery    . Tubal ligation    . Ablasion of uterus    . Cholecystectomy      [facility]  . Carpal tunnel release  11/10/2010    Procedure: CARPAL TUNNEL RELEASE;  Surgeon: Arther Abbott, MD;  Location: [facility];  Service: Orthopedics;  Laterality: Right;  . Colonoscopy N/A 03/28/2014    Procedure: COLONOSCOPY;  Surgeon: Rogene Houston, MD;  Location: [facility];  Service: Endoscopy;  Laterality: N/A;  730   Family History  Problem Relation Age of Onset  . Cancer    . Diabetes    . Arthritis    . Asthma    . Diabetes type I Mother   . Hyperlipidemia Mother   . Diabetes type II Father   . Hypertension Father   . Hyperlipidemia Father   .  Diabetes type II Brother   . Anesthesia problems Neg Hx   . Diabetes type II Brother    Social History  Substance Use Topics  . Smoking status: Current Every Day Smoker -- 1.00 packs/day for 26 years    Types: Cigarettes  . Smokeless tobacco: Never Used  . Alcohol Use: No   OB History    No data available     Review of Systems  Constitutional: Positive for activity change and appetite change. Negative for fever.  HENT: Negative for congestion and rhinorrhea.   Respiratory: Negative for cough, chest tightness and stridor.   Cardiovascular: Negative for chest pain.  Gastrointestinal: Positive for nausea, abdominal pain and diarrhea. Negative for vomiting.  Genitourinary: Negative for dysuria, hematuria, vaginal bleeding and vaginal discharge.  Musculoskeletal: Negative for myalgias and arthralgias.  Skin: Negative for rash.  Neurological: Negative for dizziness, weakness, light-headedness and headaches.  A complete 10 system review of systems was obtained and all systems are negative except as noted in the HPI and PMH.      Allergies  Flagyl and Hydrocodone  Home Medications   Prior to Admission medications   Medication Sig Start Date End Date Taking? Authorizing Provider  docusate sodium (COLACE) 100 MG capsule Take 1 capsule (100 mg total) by mouth every 12 (twelve) hours. While on narcotic pain medicine. 12/26/14  Yes Sara Lee,  MD  ibuprofen (ADVIL,MOTRIN) 200 MG tablet Take 800 mg by mouth 2 (two) times daily as needed. For pain   Yes Historical Provider, MD  ondansetron (ZOFRAN) 8 MG tablet Take 1 tablet (8 mg total) by mouth every 8 (eight) hours as needed for nausea or vomiting. 12/31/14  Yes Francine Graven, DO  promethazine (PHENERGAN) 25 MG suppository Place 1 suppository (25 mg total) rectally every 6 (six) hours as needed for nausea or vomiting. Patient not taking: Reported on 01/08/2015 12/31/14   Francine Graven, DO   BP 131/78 mmHg  Pulse 76  Temp(Src) 98.4 F  (36.9 C) (Oral)  Resp 18  Ht 5\' 5"  (1.651 m)  Wt 223 lb 1.6 oz (101.197 kg)  BMI 37.13 kg/m2  SpO2 97% Physical Exam  Constitutional: She is oriented to person, place, and time. She appears well-developed and well-nourished. No distress.  HENT:  Head: Normocephalic and atraumatic.  Mouth/Throat: Oropharynx is clear and moist. No oropharyngeal exudate.  Eyes: Conjunctivae and EOM are normal. Pupils are equal, round, and reactive to light.  Neck: Normal range of motion. Neck supple.  No meningismus.  Cardiovascular: Normal rate, regular rhythm, normal heart sounds and intact distal pulses.   No murmur heard. Pulmonary/Chest: Effort normal and breath sounds normal. No respiratory distress.  Abdominal: Soft. There is tenderness. There is no rebound and no guarding.  Diffuse lower abdominal tenderness, no guarding or rebound.  Genitourinary:  Chaperone present. No gross blood. No external hemorrhoids. No fissures  Musculoskeletal: Normal range of motion. She exhibits no edema or tenderness.  Neurological: She is alert and oriented to person, place, and time. No cranial nerve deficit. She exhibits normal muscle tone. Coordination normal.  No ataxia on finger to nose bilaterally. No pronator drift. 5/5 strength throughout. CN 2-12 intact. Negative Romberg. Equal grip strength. Sensation intact. Gait is normal.   Skin: Skin is warm.  Psychiatric: She has a normal mood and affect. Her behavior is normal.  Nursing note and vitals reviewed.   ED Course  Procedures (including critical care time) Labs Review Labs Reviewed  CBC WITH DIFFERENTIAL/PLATELET - Abnormal; Notable for the following:    WBC 14.5 (*)    RBC 5.31 (*)    Hemoglobin 16.1 (*)    Platelets 416 (*)    Neutro Abs 10.2 (*)    All other components within normal limits  COMPREHENSIVE METABOLIC PANEL - Abnormal; Notable for the following:    Glucose, Bld 140 (*)    Total Protein 8.4 (*)    All other components within  normal limits  LIPASE, BLOOD - Abnormal; Notable for the following:    Lipase 15 (*)    All other components within normal limits  URINALYSIS, ROUTINE W REFLEX MICROSCOPIC (NOT AT [facility]) - Abnormal; Notable for the following:    Specific Gravity, Urine <1.005 (*)    Hgb urine dipstick SMALL (*)    All other components within normal limits  URINE MICROSCOPIC-ADD ON - Abnormal; Notable for the following:    Squamous Epithelial / LPF MANY (*)    All other components within normal limits  POC OCCULT BLOOD, ED    Imaging Review Ct Abdomen Pelvis W Contrast  01/08/2015   CLINICAL DATA:  History of diverticulitis, abdominal pain, rectal pain, not improving after antibiotics  EXAM: CT ABDOMEN AND PELVIS WITH CONTRAST  TECHNIQUE: Multidetector CT imaging of the abdomen and pelvis was performed using the standard protocol following bolus administration of intravenous contrast.  CONTRAST:  90mL  OMNIPAQUE IOHEXOL 300 MG/ML SOLN, 172mL OMNIPAQUE IOHEXOL 300 MG/ML SOLN  COMPARISON:  12/26/2014  FINDINGS: Lung bases are unremarkable. Sagittal images of the spine shows mild degenerative changes thoracolumbar spine. Mild fatty infiltration of the liver. Status postcholecystectomy. No focal hepatic mass. The pancreas, spleen and adrenal glands are unremarkable.  Abdominal aorta is unremarkable.  Kidneys are symmetrical in size and enhancement. No hydronephrosis or hydroureter. No small bowel obstruction. No ascites or free air. No adenopathy. Normal appendix. No pericecal inflammation. Again noted stranding of pericolonic fat in distal sigmoid colon mid pelvis. Findings are consistent with acute diverticulitis. There is a small diverticular abscess measures 1.7 by 1.9 cm. This is best seen in axial image 71. There is no evidence of air-fluid level.  No distal colonic obstruction. The uterus and adnexa are unremarkable. No ovarian mass. The urinary bladder is unremarkable. There is no inguinal adenopathy.  Delayed renal  images shows bilateral renal symmetrical excretion. Bilateral visualized proximal ureter is unremarkable.  IMPRESSION: 1. Persistent inflammatory changes in distal sigmoid colon consistent with acute diverticulitis. There is small diverticular abscess axial image 69 and coronal image 71 measures 1.7 x 1.9 cm. No air-fluid level is noted. 2. Normal appendix.  No pericecal inflammation. 3. Status post cholecystectomy. 4. No hydronephrosis or hydroureter.   Electronically Signed   By: Lahoma Crocker M.D.   On: 01/08/2015 13:09   I have personally reviewed and evaluated these images and lab results as part of my medical decision-making.   EKG Interpretation None      MDM   Final diagnoses:  Diverticulitis of large intestine with abscess without bleeding   abdominal pain with tenesmus, diarrhea and urgency. Recently treated for diverticulitis. Complete Augmentin yesterday.  Ongoing lower abdominal pain and rectal pain. Chronic leukocytosis noted.  CT shows persistent sigmoid diverticulitis with small abscess  CT results discussed with Dr. Arnoldo Morale. He recommended medical admission for antibiotics and IR consult for drainage of abscess. He will consult on patient.  Admission discussed with Dr. Sarajane Jews. Patient remains nothing by mouth. Pain controlled in ED. Vital stable.       Ezequiel Essex, MD 01/08/15 [id number]

## 2015-01-09 DIAGNOSIS — Z720 Tobacco use: Secondary | ICD-10-CM

## 2015-01-09 LAB — BASIC METABOLIC PANEL
Anion gap: 9 (ref 5–15)
BUN: 9 mg/dL (ref 6–20)
CALCIUM: 8.5 mg/dL — AB (ref 8.9–10.3)
CO2: 22 mmol/L (ref 22–32)
Chloride: 105 mmol/L (ref 101–111)
Creatinine, Ser: 0.59 mg/dL (ref 0.44–1.00)
GFR calc Af Amer: >60 mL/min (ref >60)
GLUCOSE: 129 mg/dL — AB (ref 65–99)
Potassium: 3.9 mmol/L (ref 3.5–5.1)
Sodium: 136 mmol/L (ref 135–145)

## 2015-01-09 LAB — CBC
HCT: 42.2 % (ref 36.0–46.0)
Hemoglobin: 14.2 g/dL (ref 12.0–15.0)
MCH: 29.4 pg (ref 26.0–34.0)
MCHC: 33.6 g/dL (ref 30.0–36.0)
MCV: 87.4 fL (ref 78.0–100.0)
Platelets: 421 10*3/uL — ABNORMAL HIGH (ref 150–400)
RBC: 4.83 MIL/uL (ref 3.87–5.11)
RDW: 13.5 % (ref 11.5–15.5)
WBC: 11 10*3/uL — ABNORMAL HIGH (ref 4.0–10.5)

## 2015-01-09 MED ORDER — OXYCODONE HCL 5 MG PO TABS
5.0000 mg | ORAL_TABLET | ORAL | Status: DC | PRN
  Administered 2015-01-09 – 2015-01-11 (×5): 10 mg via ORAL
  Filled 2015-01-09 (×5): qty 2

## 2015-01-09 MED ORDER — ENOXAPARIN SODIUM 40 MG/0.4ML ~~LOC~~ SOLN
40.0000 mg | SUBCUTANEOUS | Status: DC
  Administered 2015-01-09 – 2015-01-11 (×3): 40 mg via SUBCUTANEOUS
  Filled 2015-01-09 (×3): qty 0.4

## 2015-01-09 NOTE — Progress Notes (Signed)
PROGRESS NOTE  [name] TDV:[id number] DOB: 08/12/1970 DOA: 01/08/2015 PCP: Rosita Fire, MD  Summary: 37 yof with history of diverticulitis presented with complaints of left-sided abdominal pain, rectal pain, and nausea. She was recently treated with Cipro and Flagyl 9/2 then switched to Augmentin 9/7, which she completed 9/14, for her diverticulitis. While in the ED CT revealed persistent sigmoid diverticulitis with small abscess. Admitted for further evaluation and treatment.   Assessment/Plan: 1. Acute diverticulitis, failed outpatient treatment. Complicated by abcess formation shown on CT A/P. Hemodynamics remain stable. Leukocytosis improving. Discussed with  Dr. Arnoldo Morale, general surgery, as well as radiology. No safe window for drain placement. Dr. Arnoldo Morale recommends abx and conservative management with close follow up. 2. Tobacco use disorder. Continue patch.   Pain management  Continue abx and IVF  Begin on Lovenox and advance diet as tolerated  Code Status: Full  DVT prophylaxis: SCDs Family Communication: No family at bedside. Discussed with patient who understands and has no concerns at this time. Disposition Plan: home when improved, 2-3 days  Bridget Hodgkins, MD  [facility]  Pager [phone] If 7PM-7AM, please contact night-coverage at [email or web address], password [facility] 01/09/2015, 7:23 AM  LOS: 1 day   Consultants:  General Surgery  Radiology  Procedures:    Antibiotics:  Ciprofloxacin 9/16 >>  Flagyl  9/15 >>  HPI/Subjective: Pain feels the same, still has low back pain. Did not sleep much last night. Nausea improved with antiemetics. Denies vomiting.  Objective: Filed Vitals:   01/08/15 1330 01/08/15 1533 01/08/15 2205 01/09/15 0546  BP: 132/69 131/78 127/53 114/61  Pulse: 77 76 67 60  Temp:  98.4 F (36.9 C) 98.9 F (37.2 C) 98.9 F (37.2 C)  TempSrc:  Oral Oral Oral  Resp:  18 18 16   Height:  5\' 5"  (1.651 m)    Weight:  101.197 kg  (223 lb 1.6 oz)    SpO2: 98% 97% 99% 98%   No intake or output data in the 24 hours ending 01/09/15 0723   Filed Weights   01/08/15 1040 01/08/15 1533  Weight: 99.791 kg (220 lb) 101.197 kg (223 lb 1.6 oz)    Exam:    VSS, afebrile, not hypoxic General:  Appears comfortable, calm. Laying in bed Cardiovascular: Regular rate and rhythm, no murmur, rub or gallop. No lower extremity edema. Respiratory: Clear to auscultation bilaterally, no wheezes, rales or rhonchi. Normal respiratory effort. Abdomen: soft, positive bowel sounds, no tenderness Musculoskeletal: grossly normal tone bilateral upper and lower extremities Psychiatric: grossly normal mood and affect, speech fluent and appropriate  New data reviewed:  BMP unremarkable  WBC down to 11  Pertinent data since admission:  Abdominal CT 1. Persistent inflammatory changes in distal sigmoid colon consistent with acute diverticulitis. There is small diverticular abscess axial image 69 and coronal image 71 measures 1.7 x 1.9 cm. No air-fluid level is noted. 2. Normal appendix. No pericecal inflammation. 3. Status post cholecystectomy. 4. No hydronephrosis or hydroureter.  Pending data:   Scheduled Meds: . ciprofloxacin  400 mg Intravenous Q12H  . metronidazole  500 mg Intravenous Q8H  . nicotine  21 mg Transdermal Daily  . nystatin  5 mL Oral QID   Continuous Infusions: . sodium chloride 75 mL/hr at 01/08/15 1538    Principal Problem:   Acute diverticulitis Active Problems:   Thrush, oral   Diverticulitis of intestine with abscess   Time spent 25 minutes    By signing my name below, I, Rosalie Doctor attest that this  documentation has been prepared under the direction and in the presence of Bridget Hodgkins, MD Electronically signed: Rosalie Doctor 01/09/2015   I have reviewed the above documentation for accuracy and completeness, and I agree with the above. Bridget Hodgkins, MD

## 2015-01-09 NOTE — Consult Note (Signed)
Reason for Consult: Sigmoid diverticulitis Referring Physician: Hospitalist  [name] is an 44 y.o. female.  HPI: Patient is a 44 year old white female who just finished a round of antibiotics for sigmoid diverticulitis when she presented emergency room with worsening lower abdominal pain. Repeat CT scan the abdomen and pelvis revealed ongoing distal sigmoid colon diverticulitis with contained abscess. There was no worsening changes from a previous CAT scan done earlier in September. Patient states that her pain seemed to be worsening. It has improved since her admission. She does not know she was having fever or chills.  Past Medical History  Diagnosis Date  . Sciatica   . Carpal tunnel syndrome, bilateral   . PONV (postoperative nausea and vomiting)   . Diverticulitis     Past Surgical History  Procedure Laterality Date  . Right knee arthroscopy    . Knee surgery    . Tubal ligation    . Ablasion of uterus    . Cholecystectomy      [facility]  . Carpal tunnel release  11/10/2010    Procedure: CARPAL TUNNEL RELEASE;  Surgeon: Arther Abbott, MD;  Location: [facility];  Service: Orthopedics;  Laterality: Right;  . Colonoscopy N/A 03/28/2014    Procedure: COLONOSCOPY;  Surgeon: Rogene Houston, MD;  Location: [facility];  Service: Endoscopy;  Laterality: N/A;  730    Family History  Problem Relation Age of Onset  . Cancer    . Diabetes    . Arthritis    . Asthma    . Diabetes type I Mother   . Hyperlipidemia Mother   . Diabetes type II Father   . Hypertension Father   . Hyperlipidemia Father   . Diabetes type II Brother   . Anesthesia problems Neg Hx   . Diabetes type II Brother     Social History:  reports that she has been smoking Cigarettes.  She has a 26 pack-year smoking history. She has never used smokeless tobacco. She reports that she does not drink alcohol or use illicit drugs.  Allergies:  Allergies  Allergen Reactions  . Flagyl [Metronidazole] Nausea Only  .  Hydrocodone Nausea And Vomiting    Can take need to take with medication for nausea and vomitting    Medications: I have reviewed the patient's current medications.  Results for orders placed or performed during the hospital encounter of 01/08/15 (from the past 48 hour(s))  Urinalysis, Routine w reflex microscopic (not at [facility])     Status: Abnormal   Collection Time: 01/08/15 12:02 PM  Result Value Ref Range   Color, Urine YELLOW YELLOW   APPearance CLEAR CLEAR   Specific Gravity, Urine <1.005 (L) 1.005 - 1.030   pH 6.0 5.0 - 8.0   Glucose, UA NEGATIVE NEGATIVE mg/dL   Hgb urine dipstick SMALL (A) NEGATIVE   Bilirubin Urine NEGATIVE NEGATIVE   Ketones, ur NEGATIVE NEGATIVE mg/dL   Protein, ur NEGATIVE NEGATIVE mg/dL   Urobilinogen, UA 0.2 0.0 - 1.0 mg/dL   Nitrite NEGATIVE NEGATIVE   Leukocytes, UA NEGATIVE NEGATIVE  Urine microscopic-add on     Status: Abnormal   Collection Time: 01/08/15 12:02 PM  Result Value Ref Range   Squamous Epithelial / LPF MANY (A) RARE   WBC, UA 0-2 <3 WBC/hpf   RBC / HPF 0-2 <3 RBC/hpf   Bacteria, UA RARE RARE  CBC with Differential/Platelet     Status: Abnormal   Collection Time: 01/08/15 12:10 PM  Result Value Ref Range  WBC 14.5 (H) 4.0 - 10.5 K/uL   RBC 5.31 (H) 3.87 - 5.11 MIL/uL   Hemoglobin 16.1 (H) 12.0 - 15.0 g/dL   HCT 45.8 36.0 - 46.0 %   MCV 86.3 78.0 - 100.0 fL   MCH 30.3 26.0 - 34.0 pg   MCHC 35.2 30.0 - 36.0 g/dL   RDW 13.5 11.5 - 15.5 %   Platelets 416 (H) 150 - 400 K/uL   Neutrophils Relative % 70 %   Neutro Abs 10.2 (H) 1.7 - 7.7 K/uL   Lymphocytes Relative 24 %   Lymphs Abs 3.4 0.7 - 4.0 K/uL   Monocytes Relative 4 %   Monocytes Absolute 0.6 0.1 - 1.0 K/uL   Eosinophils Relative 1 %   Eosinophils Absolute 0.2 0.0 - 0.7 K/uL   Basophils Relative 1 %   Basophils Absolute 0.1 0.0 - 0.1 K/uL  Comprehensive metabolic panel     Status: Abnormal   Collection Time: 01/08/15 12:10 PM  Result Value Ref Range   Sodium 135 135  - 145 mmol/L   Potassium 4.1 3.5 - 5.1 mmol/L   Chloride 101 101 - 111 mmol/L   CO2 24 22 - 32 mmol/L   Glucose, Bld 140 (H) 65 - 99 mg/dL   BUN 13 6 - 20 mg/dL   Creatinine, Ser 0.63 0.44 - 1.00 mg/dL   Calcium 9.3 8.9 - 10.3 mg/dL   Total Protein 8.4 (H) 6.5 - 8.1 g/dL   Albumin 4.5 3.5 - 5.0 g/dL   AST 29 15 - 41 U/L   ALT 45 14 - 54 U/L   Alkaline Phosphatase 96 38 - 126 U/L   Total Bilirubin 0.6 0.3 - 1.2 mg/dL   GFR calc non Af Amer >60 >60 mL/min   GFR calc Af Amer >60 >60 mL/min    Comment: (NOTE) The eGFR has been calculated using the CKD EPI equation. This calculation has not been validated in all clinical situations. eGFR's persistently <60 mL/min signify possible Chronic Kidney Disease.    Anion gap 10 5 - 15  Lipase, blood     Status: Abnormal   Collection Time: 01/08/15 12:10 PM  Result Value Ref Range   Lipase 15 (L) 22 - 51 U/L  POC occult blood, ED Provider will collect     Status: None   Collection Time: 01/08/15 12:15 PM  Result Value Ref Range   Fecal Occult Bld NEGATIVE NEGATIVE  Basic metabolic panel     Status: Abnormal   Collection Time: 01/09/15  5:59 AM  Result Value Ref Range   Sodium 136 135 - 145 mmol/L   Potassium 3.9 3.5 - 5.1 mmol/L   Chloride 105 101 - 111 mmol/L   CO2 22 22 - 32 mmol/L   Glucose, Bld 129 (H) 65 - 99 mg/dL   BUN 9 6 - 20 mg/dL   Creatinine, Ser 0.59 0.44 - 1.00 mg/dL   Calcium 8.5 (L) 8.9 - 10.3 mg/dL   GFR calc non Af Amer >60 >60 mL/min   GFR calc Af Amer >60 >60 mL/min    Comment: (NOTE) The eGFR has been calculated using the CKD EPI equation. This calculation has not been validated in all clinical situations. eGFR's persistently <60 mL/min signify possible Chronic Kidney Disease.    Anion gap 9 5 - 15  CBC     Status: Abnormal   Collection Time: 01/09/15  5:59 AM  Result Value Ref Range   WBC 11.0 (H) 4.0 - 10.5  K/uL   RBC 4.83 3.87 - 5.11 MIL/uL   Hemoglobin 14.2 12.0 - 15.0 g/dL   HCT 42.2 36.0 - 46.0 %    MCV 87.4 78.0 - 100.0 fL   MCH 29.4 26.0 - 34.0 pg   MCHC 33.6 30.0 - 36.0 g/dL   RDW 13.5 11.5 - 15.5 %   Platelets 421 (H) 150 - 400 K/uL    Ct Abdomen Pelvis W Contrast  01/08/2015   CLINICAL DATA:  History of diverticulitis, abdominal pain, rectal pain, not improving after antibiotics  EXAM: CT ABDOMEN AND PELVIS WITH CONTRAST  TECHNIQUE: Multidetector CT imaging of the abdomen and pelvis was performed using the standard protocol following bolus administration of intravenous contrast.  CONTRAST:  34m OMNIPAQUE IOHEXOL 300 MG/ML SOLN, 1058mOMNIPAQUE IOHEXOL 300 MG/ML SOLN  COMPARISON:  12/26/2014  FINDINGS: Lung bases are unremarkable. Sagittal images of the spine shows mild degenerative changes thoracolumbar spine. Mild fatty infiltration of the liver. Status postcholecystectomy. No focal hepatic mass. The pancreas, spleen and adrenal glands are unremarkable.  Abdominal aorta is unremarkable.  Kidneys are symmetrical in size and enhancement. No hydronephrosis or hydroureter. No small bowel obstruction. No ascites or free air. No adenopathy. Normal appendix. No pericecal inflammation. Again noted stranding of pericolonic fat in distal sigmoid colon mid pelvis. Findings are consistent with acute diverticulitis. There is a small diverticular abscess measures 1.7 by 1.9 cm. This is best seen in axial image 71. There is no evidence of air-fluid level.  No distal colonic obstruction. The uterus and adnexa are unremarkable. No ovarian mass. The urinary bladder is unremarkable. There is no inguinal adenopathy.  Delayed renal images shows bilateral renal symmetrical excretion. Bilateral visualized proximal ureter is unremarkable.  IMPRESSION: 1. Persistent inflammatory changes in distal sigmoid colon consistent with acute diverticulitis. There is small diverticular abscess axial image 69 and coronal image 71 measures 1.7 x 1.9 cm. No air-fluid level is noted. 2. Normal appendix.  No pericecal inflammation.  3. Status post cholecystectomy. 4. No hydronephrosis or hydroureter.   Electronically Signed   By: LiLahoma Crocker.D.   On: 01/08/2015 13:09    ROS: See chart Blood pressure 114/61, pulse 60, temperature 98.9 F (37.2 C), temperature source Oral, resp. rate 16, height 5' 5"  (1.651 m), weight 101.197 kg (223 lb 1.6 oz), SpO2 98 %. Physical Exam: Obese white female in no acute distress. Abdomen is soft with minimal tenderness in the left lower quadrant to deep palpation. No rigidity is noted.  Assessment/Plan: Impression: Sigmoid diverticulitis. Pain control has been issue for this patient. This is her first episode. Ideally, I would try to avoid any surgical intervention at this time. Apparently she is not a candidate for interventional radiologic drainage of the small abscess.  Plan: Would continue IV antibiotics and full liquid diet. Will follow with you.  JENKINS,MARK A 01/09/2015, 10:01 AM

## 2015-01-10 LAB — HIV ANTIBODY (ROUTINE TESTING W REFLEX): HIV SCREEN 4TH GENERATION: NONREACTIVE

## 2015-01-10 MED ORDER — SODIUM CHLORIDE 0.9 % IV SOLN
3.0000 g | Freq: Three times a day (TID) | INTRAVENOUS | Status: DC
  Administered 2015-01-10 – 2015-01-11 (×3): 3 g via INTRAVENOUS
  Filled 2015-01-10 (×7): qty 3

## 2015-01-10 MED ORDER — POLYVINYL ALCOHOL 1.4 % OP SOLN
1.0000 [drp] | OPHTHALMIC | Status: DC | PRN
  Administered 2015-01-10: 1 [drp] via OPHTHALMIC
  Filled 2015-01-10: qty 15

## 2015-01-10 MED ORDER — PROMETHAZINE HCL 12.5 MG PO TABS
12.5000 mg | ORAL_TABLET | Freq: Four times a day (QID) | ORAL | Status: DC | PRN
  Administered 2015-01-10 (×2): 12.5 mg via ORAL
  Filled 2015-01-10 (×2): qty 1

## 2015-01-10 NOTE — Progress Notes (Signed)
To room to give meds.  Patient complains of eyes burning and watering.  States this is new problem.  Hospitalist informed

## 2015-01-10 NOTE — Progress Notes (Signed)
Dr. Sarajane Jews called, ordered po Phenergan and artificial tears.  Patient informed, meds given.

## 2015-01-10 NOTE — Discharge Instructions (Signed)
Diverticulitis °Diverticulitis is when small pockets that have formed in your colon (large intestine) become infected or swollen. °HOME CARE °· Follow your doctor's instructions. °· Follow a special diet if told by your doctor. °· When you feel better, your doctor may tell you to change your diet. You may be told to eat a lot of fiber. Fruits and vegetables are good sources of fiber. Fiber makes it easier to poop (have bowel movements). °· Take supplements or probiotics as told by your doctor. °· Only take medicines as told by your doctor. °· Keep all follow-up visits with your doctor. °GET HELP IF: °· Your pain does not get better. °· You have a hard time eating food. °· You are not pooping like normal. °GET HELP RIGHT AWAY IF: °· Your pain gets worse. °· Your problems do not get better. °· Your problems suddenly get worse. °· You have a fever. °· You keep throwing up (vomiting). °· You have bloody or black, tarry poop (stool). °MAKE SURE YOU:  °· Understand these instructions. °· Will watch your condition. °· Will get help right away if you are not doing well or get worse. °Document Released: 09/28/2007 Document Revised: 04/16/2013 Document Reviewed: 03/06/2013 °[employer]® Patient Information ©2015 [employer]. This information is not intended to replace advice given to you by your health care provider. Make sure you discuss any questions you have with your health care provider. ° °

## 2015-01-10 NOTE — Progress Notes (Signed)
Subjective: Pain has eased. Patient is tolerating diet okay.  Objective: Vital signs in last 24 hours: Temp:  [98.4 F (36.9 C)-98.8 F (37.1 C)] 98.4 F (36.9 C) (09/17 0551) Pulse Rate:  [44-63] 44 (09/17 0551) Resp:  [16-18] 16 (09/17 0551) BP: (122-132)/(59-87) 122/59 mmHg (09/17 0551) SpO2:  [97 %-100 %] 100 % (09/17 0551) Last BM Date: 01/09/15  Intake/Output from previous day: 09/16 0701 - 09/17 0700 In: 480 [P.O.:480] Out: -  Intake/Output this shift:    General appearance: alert, cooperative and no distress GI: Soft with minimal tenderness noted in the lower abdomen. No rigidity is noted.  Lab Results:   Recent Labs  01/08/15 1210 01/09/15 0559  WBC 14.5* 11.0*  HGB 16.1* 14.2  HCT 45.8 42.2  PLT 416* 421*   BMET  Recent Labs  01/08/15 1210 01/09/15 0559  NA 135 136  K 4.1 3.9  CL 101 105  CO2 24 22  GLUCOSE 140* 129*  BUN 13 9  CREATININE 0.63 0.59  CALCIUM 9.3 8.5*   PT/INR No results for input(s): LABPROT, INR in the last 72 hours.  Studies/Results: Ct Abdomen Pelvis W Contrast  01/08/2015   CLINICAL DATA:  History of diverticulitis, abdominal pain, rectal pain, not improving after antibiotics  EXAM: CT ABDOMEN AND PELVIS WITH CONTRAST  TECHNIQUE: Multidetector CT imaging of the abdomen and pelvis was performed using the standard protocol following bolus administration of intravenous contrast.  CONTRAST:  38mL OMNIPAQUE IOHEXOL 300 MG/ML SOLN, 164mL OMNIPAQUE IOHEXOL 300 MG/ML SOLN  COMPARISON:  12/26/2014  FINDINGS: Lung bases are unremarkable. Sagittal images of the spine shows mild degenerative changes thoracolumbar spine. Mild fatty infiltration of the liver. Status postcholecystectomy. No focal hepatic mass. The pancreas, spleen and adrenal glands are unremarkable.  Abdominal aorta is unremarkable.  Kidneys are symmetrical in size and enhancement. No hydronephrosis or hydroureter. No small bowel obstruction. No ascites or free air. No  adenopathy. Normal appendix. No pericecal inflammation. Again noted stranding of pericolonic fat in distal sigmoid colon mid pelvis. Findings are consistent with acute diverticulitis. There is a small diverticular abscess measures 1.7 by 1.9 cm. This is best seen in axial image 71. There is no evidence of air-fluid level.  No distal colonic obstruction. The uterus and adnexa are unremarkable. No ovarian mass. The urinary bladder is unremarkable. There is no inguinal adenopathy.  Delayed renal images shows bilateral renal symmetrical excretion. Bilateral visualized proximal ureter is unremarkable.  IMPRESSION: 1. Persistent inflammatory changes in distal sigmoid colon consistent with acute diverticulitis. There is small diverticular abscess axial image 69 and coronal image 71 measures 1.7 x 1.9 cm. No air-fluid level is noted. 2. Normal appendix.  No pericecal inflammation. 3. Status post cholecystectomy. 4. No hydronephrosis or hydroureter.   Electronically Signed   By: Lahoma Crocker M.D.   On: 01/08/2015 13:09    Anti-infectives: Anti-infectives    Start     Dose/Rate Route Frequency Ordered Stop   01/09/15 0100  ciprofloxacin (CIPRO) IVPB 400 mg     400 mg 200 mL/hr over 60 Minutes Intravenous Every 12 hours 01/08/15 1536     01/08/15 2300  metroNIDAZOLE (FLAGYL) IVPB 500 mg     500 mg 100 mL/hr over 60 Minutes Intravenous Every 8 hours 01/08/15 1535     01/08/15 1515  ciprofloxacin (CIPRO) IVPB 400 mg  Status:  Discontinued     400 mg 200 mL/hr over 60 Minutes Intravenous Every 12 hours 01/08/15 1508 01/08/15 1536   01/08/15  1515  metroNIDAZOLE (FLAGYL) IVPB 500 mg  Status:  Discontinued     500 mg 100 mL/hr over 60 Minutes Intravenous Every 8 hours 01/08/15 1508 01/08/15 1535   01/08/15 1330  metroNIDAZOLE (FLAGYL) IVPB 500 mg     500 mg 100 mL/hr over 60 Minutes Intravenous  Once 01/08/15 1317 01/08/15 1542   01/08/15 1330  ciprofloxacin (CIPRO) IVPB 400 mg     400 mg 200 mL/hr over 60  Minutes Intravenous  Once 01/08/15 1317 01/08/15 1440      Assessment/Plan: Impression: Sigmoid diverticulitis, slowly resolving. Plan: Would continue Augmentin for 1 week. Would use Percocet for pain. The patient has strict instructions to call me should she worsen. I will see her in my office in 1 week.  LOS: 2 days    JENKINS,MARK A 01/10/2015

## 2015-01-10 NOTE — Progress Notes (Signed)
Called to room. Patient vomiting in trash can sitting up at bedside.  Received Zofran at [phone], next due at 1242.  Sarajane Jews informed via email.  Will continue to monitor.

## 2015-01-10 NOTE — Progress Notes (Signed)
ANTIBIOTIC CONSULT NOTE - INITIAL  Pharmacy Consult for unasyn Indication: intra-abdominal infection  Allergies  Allergen Reactions  . Flagyl [Metronidazole] Nausea Only  . Hydrocodone Nausea And Vomiting    Can take need to take with medication for nausea and vomitting    Patient Measurements: Height: 5\' 5"  (165.1 cm) Weight: 223 lb 1.6 oz (101.197 kg) IBW/kg (Calculated) : 57   Vital Signs: Temp: 98.4 F (36.9 C) (09/17 0551) Temp Source: Oral (09/17 0551) BP: 122/59 mmHg (09/17 0551) Pulse Rate: 44 (09/17 0551) Intake/Output from previous day: 09/16 0701 - 09/17 0700 In: 480 [P.O.:480] Out: -  Intake/Output from this shift: Total I/O In: 240 [P.O.:240] Out: -   Labs:  Recent Labs  01/08/15 1210 01/09/15 0559  WBC 14.5* 11.0*  HGB 16.1* 14.2  PLT 416* 421*  CREATININE 0.63 0.59   Estimated Creatinine Clearance: 108 mL/min (by C-G formula based on Cr of 0.59). No results for input(s): VANCOTROUGH, VANCOPEAK, VANCORANDOM, GENTTROUGH, GENTPEAK, GENTRANDOM, TOBRATROUGH, TOBRAPEAK, TOBRARND, AMIKACINPEAK, AMIKACINTROU, AMIKACIN in the last 72 hours.   Microbiology: No results found for this or any previous visit (from the past 720 hour(s)).  Medical History: Past Medical History  Diagnosis Date  . Sciatica   . Carpal tunnel syndrome, bilateral   . PONV (postoperative nausea and vomiting)   . Diverticulitis     Medications:  Prescriptions prior to admission  Medication Sig Dispense Refill Last Dose  . docusate sodium (COLACE) 100 MG capsule Take 1 capsule (100 mg total) by mouth every 12 (twelve) hours. While on narcotic pain medicine. 30 capsule 0 01/07/2015 at Unknown time  . ibuprofen (ADVIL,MOTRIN) 200 MG tablet Take 800 mg by mouth 2 (two) times daily as needed. For pain   Past Week at Unknown time  . ondansetron (ZOFRAN) 8 MG tablet Take 1 tablet (8 mg total) by mouth every 8 (eight) hours as needed for nausea or vomiting. 6 tablet 0 Past Week at  Unknown time  . promethazine (PHENERGAN) 25 MG suppository Place 1 suppository (25 mg total) rectally every 6 (six) hours as needed for nausea or vomiting. (Patient not taking: Reported on 01/08/2015) 6 each 0 Not Taking at Unknown time   Assessment: 44 yo lady to start unasyn for intra-abdominal infection.  She has been on flagyl and cipro and experiencing n/v.    Goal of Therapy:  Eradication of infection  Plan:  Unasyn 3 gm IV q8 hours F/u clinical course.  Excell Seltzer Poteet 01/10/2015,12:52 PM

## 2015-01-10 NOTE — Progress Notes (Addendum)
PROGRESS NOTE  [name] MVH:[id number] DOB: 09-26-1970 DOA: 01/08/2015 PCP: Rosita Fire, MD  Summary: 34 yof with history of diverticulitis presented with complaints of left-sided abdominal pain, rectal pain, and nausea. She was recently treated with Cipro and Flagyl 9/2 then switched to Augmentin 9/7, which she completed 9/14, for her diverticulitis. While in the ED CT revealed persistent sigmoid diverticulitis with small abscess. Admitted for further evaluation and treatment.   Assessment/Plan: 1. Acute diverticulitis, failed outpatient treatment. Complicated by abcess formation shown on CT A/P. Hemodynamics remain stable. Leukocytosis improving. Discussed with Dr. Arnoldo Morale, general surgery, as well as radiology. No safe window for drain placement. Dr. Arnoldo Morale recommends abx and conservative management with close follow up.  2. Tobacco use disorder. Continue patch. 3. Oral candidiasis. Appears resolved.     Afebrile and WBC improving, but now nauseous and not tolerating diet. Etiology of nausea unclear but consider Flagyl based on history  Plan to stop Cipro and Flagyl, start Unasyn.   Hold discharge. Check BMP and CBC in AM  Code Status: Full  DVT prophylaxis: SCDs Family Communication: family at bedside. No questions at this time.  Disposition Plan: home when improved, 1-2 days  Murray Hodgkins, MD  [facility]  Pager [phone] If 7PM-7AM, please contact night-coverage at [email or web address], password [facility] 01/10/2015, 6:55 AM  LOS: 2 days   Consultants:  General Surgery  Radiology  Procedures:    Antibiotics:  Ciprofloxacin 9/16 >> 9/17  Flagyl  9/15 >> 9/17  Unasyn 9/17 >>   HPI/Subjective: Episode of emesis this AM. Still nauseous. No pain last night. Unable to eat this morning.   Objective: Filed Vitals:   01/09/15 0546 01/09/15 1428 01/09/15 2110 01/10/15 0551  BP: 114/61 122/59 132/87 122/59  Pulse: 60 55 63 44  Temp: 98.9 F (37.2 C)  98.8 F  (37.1 C) 98.4 F (36.9 C)  TempSrc: Oral  Oral Oral  Resp: 16 18 18 16   Height:      Weight:      SpO2: 98% 100% 97% 100%    Intake/Output Summary (Last 24 hours) at 01/10/15 0655 Last data filed at 01/09/15 1810  Gross per 24 hour  Intake    480 ml  Output      0 ml  Net    480 ml     Filed Weights   01/08/15 1040 01/08/15 1533  Weight: 99.791 kg (220 lb) 101.197 kg (223 lb 1.6 oz)    Exam:    VSS, afebrile, not hypoxic General:  Appears calm but mildly uncomfortable, lying in bed Cardiovascular: RRR, no m/r/g. No LE edema. Respiratory: CTA bilaterally, no w/r/r. Normal respiratory effort. Abdomen: soft, ntnd Musculoskeletal: grossly normal tone BUE/BLE Psychiatric: grossly normal mood and affect, speech fluent and appropriate  New data reviewed:  No new data   Pertinent data since admission:  Abdominal CT 1. Persistent inflammatory changes in distal sigmoid colon consistent with acute diverticulitis. There is small diverticular abscess axial image 69 and coronal image 71 measures 1.7 x 1.9 cm. No air-fluid level is noted. 2. Normal appendix. No pericecal inflammation. 3. Status post cholecystectomy. 4. No hydronephrosis or hydroureter.  Pending data:   Scheduled Meds: . ciprofloxacin  400 mg Intravenous Q12H  . enoxaparin (LOVENOX) injection  40 mg Subcutaneous Q24H  . metronidazole  500 mg Intravenous Q8H  . nicotine  21 mg Transdermal Daily  . nystatin  5 mL Oral QID   Continuous Infusions:    Principal Problem:   Acute diverticulitis  Active Problems:   Thrush, oral   Diverticulitis of intestine with abscess   Time spent 20 minutes    By signing my name below, I, Arielle Khosrowpour, attest that this documentation has been prepared under the direction and in the presence of [name]. Sarajane Jews, MD. Electronically signed: Salvadore Oxford  01/10/2015  I personally performed the services described in this documentation. All medical  record entries made by the scribe were at my direction. I have reviewed the chart and agree that the record reflects my personal performance and is accurate and complete. Murray Hodgkins, MD

## 2015-01-11 DIAGNOSIS — K5732 Diverticulitis of large intestine without perforation or abscess without bleeding: Secondary | ICD-10-CM

## 2015-01-11 LAB — BASIC METABOLIC PANEL
Anion gap: 8 (ref 5–15)
BUN: 8 mg/dL (ref 6–20)
CALCIUM: 8.4 mg/dL — AB (ref 8.9–10.3)
CHLORIDE: 103 mmol/L (ref 101–111)
CO2: 26 mmol/L (ref 22–32)
CREATININE: 0.64 mg/dL (ref 0.44–1.00)
Glucose, Bld: 142 mg/dL — ABNORMAL HIGH (ref 65–99)
Potassium: 3.9 mmol/L (ref 3.5–5.1)
SODIUM: 137 mmol/L (ref 135–145)

## 2015-01-11 LAB — CBC
HCT: 42.1 % (ref 36.0–46.0)
Hemoglobin: 14 g/dL (ref 12.0–15.0)
MCH: 28.9 pg (ref 26.0–34.0)
MCHC: 33.3 g/dL (ref 30.0–36.0)
MCV: 86.8 fL (ref 78.0–100.0)
PLATELETS: 365 10*3/uL (ref 150–400)
RBC: 4.85 MIL/uL (ref 3.87–5.11)
RDW: 13.4 % (ref 11.5–15.5)
WBC: 9.9 10*3/uL (ref 4.0–10.5)

## 2015-01-11 MED ORDER — AMOXICILLIN-POT CLAVULANATE 875-125 MG PO TABS: 1.0000 | ORAL_TABLET | Freq: Two times a day (BID) | ORAL | Status: DC

## 2015-01-11 MED ORDER — NYSTATIN 100000 UNIT/ML MT SUSP: 5.0000 mL | Freq: Four times a day (QID) | OROMUCOSAL | Status: DC

## 2015-01-11 MED ORDER — AMOXICILLIN-POT CLAVULANATE 875-125 MG PO TABS
1.0000 | ORAL_TABLET | Freq: Two times a day (BID) | ORAL | Status: DC
  Administered 2015-01-11: 1 via ORAL
  Filled 2015-01-11: qty 1

## 2015-01-11 MED ORDER — ACETAMINOPHEN 325 MG PO TABS: 650.0000 mg | ORAL_TABLET | Freq: Four times a day (QID) | ORAL | Status: DC | PRN

## 2015-01-11 MED ORDER — NICOTINE 21 MG/24HR TD PT24: 21.0000 mg | MEDICATED_PATCH | Freq: Every day | TRANSDERMAL | Status: DC

## 2015-01-11 MED ORDER — ONDANSETRON HCL 8 MG PO TABS: 8.0000 mg | ORAL_TABLET | Freq: Three times a day (TID) | ORAL | Status: DC | PRN

## 2015-01-11 MED ORDER — OXYCODONE HCL 5 MG PO TABS: 5.0000 mg | ORAL_TABLET | ORAL | Status: DC | PRN

## 2015-01-11 MED ORDER — IBUPROFEN 200 MG PO TABS: 600.0000 mg | ORAL_TABLET | Freq: Three times a day (TID) | ORAL | Status: DC | PRN

## 2015-01-11 NOTE — Discharge Summary (Signed)
Physician Discharge Summary  [name] DVV:[id number] DOB: 1970-05-03 DOA: 01/08/2015  PCP: Bridget Fire, MD Plans to see a new PCP  Admit date: 01/08/2015 Discharge date: 01/11/2015  Recommendations for Outpatient Follow-up:  1. Follow up with Dr. Arnoldo Morale, general surgery, in 1 week as per his instructions. 2. Continue taking Augmentin until 9/24 as per Dr. Arnoldo Morale.    Follow-up Information    Follow up with Bridget So, MD. Schedule an appointment as soon as possible for a visit on 01/20/2015.   Specialty:  General Surgery   Contact information:   [address] [place] [address] [phone]      Discharge Diagnoses:  1. Acute diverticulitis. 2. Tobacco use disorder. 3. Oral candidiasis.   Discharge Condition: Improved. Disposition: Discharge home.  Diet recommendation: Regular.  Filed Weights   01/08/15 1040 01/08/15 1533  Weight: 99.791 kg (220 lb) 101.197 kg (223 lb 1.6 oz)    History of present illness:  55 yof with history of diverticulitis presented with complaints of left-sided abdominal pain, rectal pain, and nausea. She was recently treated for diverticulitis with Cipro and Flagyl beginnig 9/2 then switched to Augmentin on  9/7, which she completed 9/14. While in the ED CT abdomen revealed persistent sigmoid diverticulitis with small abscess. Admitted for further evaluation and treatment.   Hospital Course:  Acute diverticulitis, failed outpatient treatment, improved with IV abx during admission. Consulted general surgery and radiology. Per IR, no safe window for a drain placement. General surgery recommended continuing abx and conservative management with close outpatient follow-up. Patient had significant improvement of her pain and resolution of nausea. Oral candidiasis resolved with Nystatin mouth.   Individual issues as below:  1. Acute diverticulitis. 2. Tobacco use disorder. 3. Oral candidiasis.   Consultants:  General  Surgery  Radiology  Procedures:  none  Antibiotics:  Ciprofloxacin 9/16 >> 9/17  Flagyl 9/15 >> 9/17  Unasyn 9/17 >>9/18  Augmentin 9/18 >>9/24  Discharge Instructions Discharge Instructions    Diet general    Complete by:  As directed      Discharge instructions    Complete by:  As directed   Call your physician or seek immediate medical attention for increased pain, fever, vomiting or worsening of condition.     Increase activity slowly    Complete by:  As directed             Current Discharge Medication List    START taking these medications   Details  acetaminophen (TYLENOL) 325 MG tablet Take 2 tablets (650 mg total) by mouth every 6 (six) hours as needed for mild pain (or Fever >/= 101).    amoxicillin-clavulanate (AUGMENTIN) 875-125 MG per tablet Take 1 tablet by mouth every 12 (twelve) hours. Qty: 14 tablet, Refills: 0    nicotine (NICODERM CQ - DOSED IN MG/24 HOURS) 21 mg/24hr patch Place 1 patch (21 mg total) onto the skin daily. Qty: 28 patch, Refills: 0    nystatin (MYCOSTATIN) 100000 UNIT/ML suspension Take 5 mLs (500,000 Units total) by mouth 4 (four) times daily. Qty: 60 mL, Refills: 0    oxyCODONE (OXY IR/ROXICODONE) 5 MG immediate release tablet Take 1-2 tablets (5-10 mg total) by mouth every 4 (four) hours as needed for severe pain. Qty: 30 tablet, Refills: 0      CONTINUE these medications which have CHANGED   Details  ibuprofen (ADVIL,MOTRIN) 200 MG tablet Take 3 tablets (600 mg total) by mouth every 8 (eight) hours as needed for moderate pain. For  pain    ondansetron (ZOFRAN) 8 MG tablet Take 1 tablet (8 mg total) by mouth every 8 (eight) hours as needed for nausea or vomiting. Qty: 15 tablet, Refills: 0      STOP taking these medications     docusate sodium (COLACE) 100 MG capsule      promethazine (PHENERGAN) 25 MG suppository        Allergies  Allergen Reactions  . Flagyl [Metronidazole] Nausea Only  . Hydrocodone Nausea  And Vomiting    Can take need to take with medication for nausea and vomitting    The results of significant diagnostics from this hospitalization (including imaging, microbiology, ancillary and laboratory) are listed below for reference.    Significant Diagnostic Studies: Ct Abdomen Pelvis W Contrast  01/08/2015   CLINICAL DATA:  History of diverticulitis, abdominal pain, rectal pain, not improving after antibiotics  EXAM: CT ABDOMEN AND PELVIS WITH CONTRAST  TECHNIQUE: Multidetector CT imaging of the abdomen and pelvis was performed using the standard protocol following bolus administration of intravenous contrast.  CONTRAST:  89mL OMNIPAQUE IOHEXOL 300 MG/ML SOLN, 166mL OMNIPAQUE IOHEXOL 300 MG/ML SOLN  COMPARISON:  12/26/2014  FINDINGS: Lung bases are unremarkable. Sagittal images of the spine shows mild degenerative changes thoracolumbar spine. Mild fatty infiltration of the liver. Status postcholecystectomy. No focal hepatic mass. The pancreas, spleen and adrenal glands are unremarkable.  Abdominal aorta is unremarkable.  Kidneys are symmetrical in size and enhancement. No hydronephrosis or hydroureter. No small bowel obstruction. No ascites or free air. No adenopathy. Normal appendix. No pericecal inflammation. Again noted stranding of pericolonic fat in distal sigmoid colon mid pelvis. Findings are consistent with acute diverticulitis. There is a small diverticular abscess measures 1.7 by 1.9 cm. This is best seen in axial image 71. There is no evidence of air-fluid level.  No distal colonic obstruction. The uterus and adnexa are unremarkable. No ovarian mass. The urinary bladder is unremarkable. There is no inguinal adenopathy.  Delayed renal images shows bilateral renal symmetrical excretion. Bilateral visualized proximal ureter is unremarkable.  IMPRESSION: 1. Persistent inflammatory changes in distal sigmoid colon consistent with acute diverticulitis. There is small diverticular abscess axial  image 69 and coronal image 71 measures 1.7 x 1.9 cm. No air-fluid level is noted. 2. Normal appendix.  No pericecal inflammation. 3. Status post cholecystectomy. 4. No hydronephrosis or hydroureter.   Electronically Signed   By: Lahoma Crocker M.D.   On: 01/08/2015 13:09   Labs: Basic Metabolic Panel:  Recent Labs Lab 01/08/15 1210 01/09/15 0559 01/11/15 0617  NA 135 136 137  K 4.1 3.9 3.9  CL 101 105 103  CO2 24 22 26   GLUCOSE 140* 129* 142*  BUN 13 9 8   CREATININE 0.63 0.59 0.64  CALCIUM 9.3 8.5* 8.4*   Liver Function Tests:  Recent Labs Lab 01/08/15 1210  AST 29  ALT 45  ALKPHOS 96  BILITOT 0.6  PROT 8.4*  ALBUMIN 4.5    Recent Labs Lab 01/08/15 1210  LIPASE 15*  CBC:  Recent Labs Lab 01/08/15 1210 01/09/15 0559 01/11/15 0617  WBC 14.5* 11.0* 9.9  NEUTROABS 10.2*  --   --   HGB 16.1* 14.2 14.0  HCT 45.8 42.2 42.1  MCV 86.3 87.4 86.8  PLT 416* 421* 365    Principal Problem:   Acute diverticulitis Active Problems:   Thrush, oral   Diverticulitis of intestine with abscess   Time coordinating discharge: 30 minutes  Signed:  Murray Hodgkins, MD [facility]  01/11/2015, 9:51 AM   By signing my name below, I, Rosalie Doctor attest that this documentation has been prepared under the direction and in the presence of Murray Hodgkins, MD Electronically signed: Rosalie Doctor, Scribe.  01/11/2015  I personally performed the services described in this documentation. All medical record entries made by the scribe were at my direction. I have reviewed the chart and agree that the record reflects my personal performance and is accurate and complete. Murray Hodgkins, MD

## 2015-01-11 NOTE — Progress Notes (Signed)
Pt's IV catheter removed and intact. IV site clean dry and intact. Discharge instructions, medications and follow up appointments reviewed and discussed with patient. All questions were answered and no further questions at this time. Pt in stable condition and in no acute distress at time of discharge. Pt escorted by RN.

## 2015-01-11 NOTE — Progress Notes (Signed)
PROGRESS NOTE  CLARECE DRZEWIECKI WUJ:[id number] DOB: 14-Apr-1971 DOA: 01/08/2015 PCP: Rosita Fire, MD  Summary: 54 yof with history of diverticulitis presented with complaints of left-sided abdominal pain, rectal pain, and nausea. She was recently treated with Cipro and Flagyl 9/2 then switched to Augmentin 9/7, which she completed 9/14, for her diverticulitis. While in the ED CT revealed persistent sigmoid diverticulitis with small abscess. Admitted for further evaluation and treatment.   Assessment/Plan: 1. Acute diverticulitis, failed outpatient treatment. Clinically improved. Complicated by abcess formation shown on CT A/P. Leukocytosis resolved. General surgery recommends continuing Augmentin until 9/24 and Percocet for pain. He will follow up with her in 1 week. 2. Tobacco use disorder. Continue patch. 3. Oral candidiasis. Resolved.     Doing well  Discharge home today on oral abx  Code Status: Full  DVT prophylaxis: SCDs Family Communication: Family at bedside. Discussed with patient who understands and has no concerns at this time. Disposition Plan: Discharge home today.   Murray Hodgkins, MD  [facility]  Pager [phone] If 7PM-7AM, please contact night-coverage at [email or web address], password [facility] 01/11/2015, 6:55 AM  LOS: 3 days   Consultants:  General Surgery  Radiology  Procedures:    Antibiotics:  Ciprofloxacin 9/16 >> 9/17  Flagyl  9/15 >> 9/17  Unasyn 9/17 >>9/18  Augmentin 9/18 >>9/24   HPI/Subjective: Pain still present but much improved. Able to eat this morning. Denies any nausea.   Objective: Filed Vitals:   01/10/15 0551 01/10/15 1429 01/10/15 2200 01/11/15 0531  BP: 122/59 125/57 121/62 115/65  Pulse: 44 70 53 60  Temp: 98.4 F (36.9 C) 98.6 F (37 C) 98.8 F (37.1 C) 97.9 F (36.6 C)  TempSrc: Oral Oral Oral Oral  Resp: 16 16 18 18   Height:      Weight:      SpO2: 100% 100% 100% 96%    Intake/Output Summary (Last 24 hours) at  01/11/15 0655 Last data filed at 01/10/15 1755  Gross per 24 hour  Intake    480 ml  Output      0 ml  Net    480 ml     Filed Weights   01/08/15 1040 01/08/15 1533  Weight: 99.791 kg (220 lb) 101.197 kg (223 lb 1.6 oz)    Exam:    VSS, afebrile, not hypoxic General:  Appears comfortable, calm. Cardiovascular: Regular rate and rhythm, no murmur, rub or gallop. No lower extremity edema. Respiratory: Clear to auscultation bilaterally, no wheezes, rales or rhonchi. Normal respiratory effort. Abdomen: soft, ntnd, positive bowel sounds, no rebound or guarding Psychiatric: grossly normal mood and affect, speech fluent and appropriate  New data reviewed:  CBC unremarkable  BMP unremarkable   Pertinent data since admission:  Abdominal CT 1. Persistent inflammatory changes in distal sigmoid colon consistent with acute diverticulitis. There is small diverticular abscess axial image 69 and coronal image 71 measures 1.7 x 1.9 cm. No air-fluid level is noted. 2. Normal appendix. No pericecal inflammation. 3. Status post cholecystectomy. 4. No hydronephrosis or hydroureter.  Pending data:   Scheduled Meds: . ampicillin-sulbactam (UNASYN) IV  3 g Intravenous Q8H  . enoxaparin (LOVENOX) injection  40 mg Subcutaneous Q24H  . nicotine  21 mg Transdermal Daily  . nystatin  5 mL Oral QID   Continuous Infusions:    Principal Problem:   Acute diverticulitis Active Problems:   Thrush, oral   Diverticulitis of intestine with abscess     By signing my name below, I, Rosalie Doctor attest  that this documentation has been prepared under the direction and in the presence of Murray Hodgkins, MD Electronically signed: Rosalie Doctor, Scribe.  01/11/2015  I personally performed the services described in this documentation. All medical record entries made by the scribe were at my direction. I have reviewed the chart and agree that the record reflects my personal performance  and is accurate and complete. Murray Hodgkins, MD

## 2015-02-17 ENCOUNTER — Emergency Department (HOSPITAL_COMMUNITY): Payer: BLUE CROSS/BLUE SHIELD

## 2015-02-17 ENCOUNTER — Encounter (HOSPITAL_COMMUNITY): Payer: Self-pay | Admitting: Emergency Medicine

## 2015-02-17 ENCOUNTER — Inpatient Hospital Stay (HOSPITAL_COMMUNITY)
Admission: EM | Admit: 2015-02-17 | Discharge: 2015-02-20 | DRG: 392 | Disposition: A | Discharge status: Home / Self Care | Payer: BLUE CROSS/BLUE SHIELD | Attending: Family Medicine | Admitting: Family Medicine

## 2015-02-17 DIAGNOSIS — Z8249 Family history of ischemic heart disease and other diseases of the circulatory system: Secondary | ICD-10-CM | POA: Diagnosis not present

## 2015-02-17 DIAGNOSIS — Z833 Family history of diabetes mellitus: Secondary | ICD-10-CM | POA: Diagnosis not present

## 2015-02-17 DIAGNOSIS — F17210 Nicotine dependence, cigarettes, uncomplicated: Secondary | ICD-10-CM | POA: Diagnosis present

## 2015-02-17 DIAGNOSIS — R1030 Lower abdominal pain, unspecified: Secondary | ICD-10-CM | POA: Diagnosis not present

## 2015-02-17 DIAGNOSIS — R109 Unspecified abdominal pain: Secondary | ICD-10-CM | POA: Diagnosis present

## 2015-02-17 DIAGNOSIS — R110 Nausea: Secondary | ICD-10-CM | POA: Diagnosis present

## 2015-02-17 DIAGNOSIS — K5792 Diverticulitis of intestine, part unspecified, without perforation or abscess without bleeding: Secondary | ICD-10-CM | POA: Diagnosis not present

## 2015-02-17 DIAGNOSIS — K5732 Diverticulitis of large intestine without perforation or abscess without bleeding: Principal | ICD-10-CM | POA: Diagnosis present

## 2015-02-17 DIAGNOSIS — Z825 Family history of asthma and other chronic lower respiratory diseases: Secondary | ICD-10-CM | POA: Diagnosis not present

## 2015-02-17 LAB — CBC WITH DIFFERENTIAL/PLATELET
Basophils Absolute: 0.1 10*3/uL (ref 0.0–0.1)
Basophils Relative: 1 %
EOS PCT: 2 %
Eosinophils Absolute: 0.2 10*3/uL (ref 0.0–0.7)
HEMATOCRIT: 43.4 % (ref 36.0–46.0)
Hemoglobin: 14.5 g/dL (ref 12.0–15.0)
LYMPHS ABS: 2.8 10*3/uL (ref 0.7–4.0)
LYMPHS PCT: 22 %
MCH: 29.2 pg (ref 26.0–34.0)
MCHC: 33.4 g/dL (ref 30.0–36.0)
MCV: 87.3 fL (ref 78.0–100.0)
MONO ABS: 0.6 10*3/uL (ref 0.1–1.0)
MONOS PCT: 4 %
NEUTROS ABS: 8.9 10*3/uL — AB (ref 1.7–7.7)
Neutrophils Relative %: 71 %
PLATELETS: 301 10*3/uL (ref 150–400)
RBC: 4.97 MIL/uL (ref 3.87–5.11)
RDW: 13.9 % (ref 11.5–15.5)
WBC: 12.6 10*3/uL — ABNORMAL HIGH (ref 4.0–10.5)

## 2015-02-17 LAB — URINALYSIS, ROUTINE W REFLEX MICROSCOPIC
GLUCOSE, UA: NEGATIVE mg/dL
KETONES UR: NEGATIVE mg/dL
Leukocytes, UA: NEGATIVE
Nitrite: NEGATIVE
PROTEIN: NEGATIVE mg/dL
Specific Gravity, Urine: 1.025 (ref 1.005–1.030)
UROBILINOGEN UA: 0.2 mg/dL (ref 0.0–1.0)
pH: 5.5 (ref 5.0–8.0)

## 2015-02-17 LAB — COMPREHENSIVE METABOLIC PANEL
ALT: 54 U/L (ref 14–54)
AST: 33 U/L (ref 15–41)
Albumin: 4.4 g/dL (ref 3.5–5.0)
Alkaline Phosphatase: 94 U/L (ref 38–126)
Anion gap: 11 (ref 5–15)
BILIRUBIN TOTAL: 0.8 mg/dL (ref 0.3–1.2)
BUN: 12 mg/dL (ref 6–20)
CHLORIDE: 103 mmol/L (ref 101–111)
CO2: 24 mmol/L (ref 22–32)
CREATININE: 0.71 mg/dL (ref 0.44–1.00)
Calcium: 9.3 mg/dL (ref 8.9–10.3)
Glucose, Bld: 153 mg/dL — ABNORMAL HIGH (ref 65–99)
POTASSIUM: 3.9 mmol/L (ref 3.5–5.1)
Sodium: 138 mmol/L (ref 135–145)
TOTAL PROTEIN: 7.8 g/dL (ref 6.5–8.1)

## 2015-02-17 LAB — URINE MICROSCOPIC-ADD ON

## 2015-02-17 MED ORDER — IOHEXOL 300 MG/ML  SOLN
100.0000 mL | Freq: Once | INTRAMUSCULAR | Status: AC | PRN
  Administered 2015-02-17: 100 mL via INTRAVENOUS

## 2015-02-17 MED ORDER — ENOXAPARIN SODIUM 40 MG/0.4ML ~~LOC~~ SOLN
40.0000 mg | SUBCUTANEOUS | Status: DC
  Administered 2015-02-17 – 2015-02-19 (×3): 40 mg via SUBCUTANEOUS
  Filled 2015-02-17 (×3): qty 0.4

## 2015-02-17 MED ORDER — SODIUM CHLORIDE 0.9 % IV SOLN
INTRAVENOUS | Status: DC
  Administered 2015-02-17 – 2015-02-18 (×3): via INTRAVENOUS

## 2015-02-17 MED ORDER — ACETAMINOPHEN 650 MG RE SUPP: 650.0000 mg | Freq: Four times a day (QID) | RECTAL | Status: DC | PRN

## 2015-02-17 MED ORDER — SODIUM CHLORIDE 0.9 % IV BOLUS (SEPSIS)
500.0000 mL | Freq: Once | INTRAVENOUS | Status: AC
  Administered 2015-02-17: 500 mL via INTRAVENOUS

## 2015-02-17 MED ORDER — SODIUM CHLORIDE 0.9 % IV SOLN
INTRAVENOUS | Status: AC
  Filled 2015-02-17 (×2): qty 1.5

## 2015-02-17 MED ORDER — MORPHINE SULFATE (PF) 4 MG/ML IV SOLN
4.0000 mg | INTRAVENOUS | Status: AC | PRN
  Administered 2015-02-17 (×3): 4 mg via INTRAVENOUS
  Filled 2015-02-17 (×3): qty 1

## 2015-02-17 MED ORDER — ONDANSETRON HCL 4 MG/2ML IJ SOLN
4.0000 mg | Freq: Once | INTRAMUSCULAR | Status: AC
  Administered 2015-02-17: 4 mg via INTRAVENOUS
  Filled 2015-02-17: qty 2

## 2015-02-17 MED ORDER — SODIUM CHLORIDE 0.9 % IV SOLN
1.5000 g | Freq: Four times a day (QID) | INTRAVENOUS | Status: DC
  Administered 2015-02-17 – 2015-02-20 (×11): 1.5 g via INTRAVENOUS
  Filled 2015-02-17 (×24): qty 1.5

## 2015-02-17 MED ORDER — SODIUM CHLORIDE 0.9 % IV SOLN
Freq: Once | INTRAVENOUS | Status: AC
  Administered 2015-02-17: 10:00:00 via INTRAVENOUS

## 2015-02-17 MED ORDER — ONDANSETRON HCL 4 MG/2ML IJ SOLN
4.0000 mg | Freq: Four times a day (QID) | INTRAMUSCULAR | Status: DC | PRN
  Administered 2015-02-17 – 2015-02-18 (×3): 4 mg via INTRAVENOUS
  Filled 2015-02-17 (×3): qty 2

## 2015-02-17 MED ORDER — AMPICILLIN-SULBACTAM SODIUM 3 (2-1) G IJ SOLR
3.0000 g | Freq: Once | INTRAMUSCULAR | Status: AC
  Administered 2015-02-17: 3 g via INTRAVENOUS
  Filled 2015-02-17: qty 3

## 2015-02-17 MED ORDER — MORPHINE SULFATE (PF) 2 MG/ML IV SOLN
2.0000 mg | INTRAVENOUS | Status: DC | PRN
  Administered 2015-02-17 – 2015-02-18 (×4): 2 mg via INTRAVENOUS
  Filled 2015-02-17 (×4): qty 1

## 2015-02-17 MED ORDER — ACETAMINOPHEN 325 MG PO TABS: 650.0000 mg | ORAL_TABLET | Freq: Four times a day (QID) | ORAL | Status: DC | PRN

## 2015-02-17 NOTE — ED Provider Notes (Signed)
CSN: [id number]     Arrival date & time 02/17/15  0757 History   First MD Initiated Contact with Patient 02/17/15 [phone]     Chief Complaint  Patient presents with  . Abdominal Pain      HPI  Patient presents for evaluation of abdominal pain and a concern about a recurrence of diverticulitis.  Patient diagnosed with diverticulitis early September. Treated with Cipro and Flagyl outpatient. States she became "very sick". States she was told it might be from her medications and was changed to Augmentin. Continue to worsen at home. Repeat ER visit and scabs can show an evolving abscess. Treated inpatient with IV antibodies and improved. States she has felt well for approximately 3-4 weeks before symptoms started 2 days ago. Now recurrent lower abdominal pain left greater than right. Loose stools with diarrhea. Presents here.  Recent therapy:   Antibiotics:  Ciprofloxacin 9/02 >> 9/07  Flagyl 9/02 >> 9/07  Augmentin 9/7 to 9/17  Unasyn 9/17 >>9/18--Hospitalization, surgical consult, no procedures  Augmentin 9/18 >>9/24  Past Medical History  Diagnosis Date  . Sciatica   . Carpal tunnel syndrome, bilateral   . PONV (postoperative nausea and vomiting)   . Diverticulitis    Past Surgical History  Procedure Laterality Date  . Right knee arthroscopy    . Knee surgery    . Tubal ligation    . Ablasion of uterus    . Cholecystectomy      [facility]  . Carpal tunnel release  11/10/2010    Procedure: CARPAL TUNNEL RELEASE;  Surgeon: Arther Abbott, MD;  Location: [facility] ORS;  Service: Orthopedics;  Laterality: Right;  . Colonoscopy N/A 03/28/2014    Procedure: COLONOSCOPY;  Surgeon: Rogene Houston, MD;  Location: [facility] SUITE;  Service: Endoscopy;  Laterality: N/A;  730   Family History  Problem Relation Age of Onset  . Cancer    . Diabetes    . Arthritis    . Asthma    . Diabetes type I Mother   . Hyperlipidemia Mother   . Diabetes type II Father   . Hypertension Father   .  Hyperlipidemia Father   . Diabetes type II Brother   . Anesthesia problems Neg Hx   . Diabetes type II Brother    Social History  Substance Use Topics  . Smoking status: Current Every Day Smoker -- 1.00 packs/day for 26 years    Types: Cigarettes  . Smokeless tobacco: Never Used  . Alcohol Use: No   OB History    No data available     Review of Systems  Constitutional: Negative for fever, chills, diaphoresis, appetite change and fatigue.  HENT: Negative for mouth sores, sore throat and trouble swallowing.   Eyes: Negative for visual disturbance.  Respiratory: Negative for cough, chest tightness, shortness of breath and wheezing.   Cardiovascular: Negative for chest pain.  Gastrointestinal: Positive for abdominal pain and diarrhea. Negative for nausea, vomiting and abdominal distention.  Endocrine: Negative for polydipsia, polyphagia and polyuria.  Genitourinary: Negative for dysuria, frequency and hematuria.  Musculoskeletal: Negative for gait problem.  Skin: Negative for color change, pallor and rash.  Neurological: Negative for dizziness, syncope, light-headedness and headaches.  Hematological: Does not bruise/bleed easily.  Psychiatric/Behavioral: Negative for behavioral problems and confusion.      Allergies  Flagyl and Hydrocodone  Home Medications   Prior to Admission medications   Medication Sig Start Date End Date Taking? Authorizing Provider  acetaminophen (TYLENOL) 325 MG tablet Take  2 tablets (650 mg total) by mouth every 6 (six) hours as needed for mild pain (or Fever >/= 101). 01/11/15  Yes Samuella Cota, MD  ibuprofen (ADVIL,MOTRIN) 200 MG tablet Take 3 tablets (600 mg total) by mouth every 8 (eight) hours as needed for moderate pain. For pain 01/11/15  Yes Samuella Cota, MD  ondansetron (ZOFRAN) 8 MG tablet Take 1 tablet (8 mg total) by mouth every 8 (eight) hours as needed for nausea or vomiting. 01/11/15  Yes Samuella Cota, MD  oxyCODONE (OXY  IR/ROXICODONE) 5 MG immediate release tablet Take 1-2 tablets (5-10 mg total) by mouth every 4 (four) hours as needed for severe pain. 01/11/15  Yes Samuella Cota, MD  amoxicillin-clavulanate (AUGMENTIN) 875-125 MG per tablet Take 1 tablet by mouth every 12 (twelve) hours. Patient not taking: Reported on 02/17/2015 01/11/15   Samuella Cota, MD  nicotine (NICODERM CQ - DOSED IN MG/24 HOURS) 21 mg/24hr patch Place 1 patch (21 mg total) onto the skin daily. Patient not taking: Reported on 02/17/2015 01/11/15   Samuella Cota, MD  nystatin (MYCOSTATIN) 100000 UNIT/ML suspension Take 5 mLs (500,000 Units total) by mouth 4 (four) times daily. Patient not taking: Reported on 02/17/2015 01/11/15   Samuella Cota, MD   BP 130/86 mmHg  Pulse 62  Temp(Src) 97.8 F (36.6 C) (Oral)  Resp 16  Ht 5\' 4"  (1.626 m)  Wt 210 lb (95.255 kg)  BMI 36.03 kg/m2  SpO2 100% Physical Exam  Constitutional: She is oriented to person, place, and time. She appears well-developed and well-nourished. No distress.  HENT:  Head: Normocephalic.  Eyes: Conjunctivae are normal. Pupils are equal, round, and reactive to light. No scleral icterus.  Neck: Normal range of motion. Neck supple. No thyromegaly present.  Cardiovascular: Normal rate and regular rhythm.  Exam reveals no gallop and no friction rub.   No murmur heard. Pulmonary/Chest: Effort normal and breath sounds normal. No respiratory distress. She has no wheezes. She has no rales.  Abdominal: Soft. Bowel sounds are normal. She exhibits no distension. There is no tenderness. There is no rebound.    Musculoskeletal: Normal range of motion.  Neurological: She is alert and oriented to person, place, and time.  Skin: Skin is warm and dry. No rash noted.  Psychiatric: She has a normal mood and affect. Her behavior is normal.    ED Course  Procedures (including critical care time) Labs Review Labs Reviewed  CBC WITH DIFFERENTIAL/PLATELET - Abnormal;  Notable for the following:    WBC 12.6 (*)    Neutro Abs 8.9 (*)    All other components within normal limits  COMPREHENSIVE METABOLIC PANEL - Abnormal; Notable for the following:    Glucose, Bld 153 (*)    All other components within normal limits  URINALYSIS, ROUTINE W REFLEX MICROSCOPIC (NOT AT [facility]) - Abnormal; Notable for the following:    APPearance HAZY (*)    Hgb urine dipstick MODERATE (*)    Bilirubin Urine SMALL (*)    All other components within normal limits  URINE MICROSCOPIC-ADD ON - Abnormal; Notable for the following:    Squamous Epithelial / LPF MANY (*)    Bacteria, UA FEW (*)    All other components within normal limits    Imaging Review Ct Abdomen Pelvis W Contrast  02/17/2015  CLINICAL DATA:  Left upper quadrant pain since Sunday. Recent diverticulitis EXAM: CT ABDOMEN AND PELVIS WITH CONTRAST TECHNIQUE: Multidetector CT imaging of the abdomen  and pelvis was performed using the standard protocol following bolus administration of intravenous contrast. CONTRAST:  142mL OMNIPAQUE IOHEXOL 300 MG/ML  SOLN COMPARISON:  01/08/2015 FINDINGS: Lower chest and abdominal wall:  No contributory findings. Hepatobiliary: Hepatic steatosis. No focal lesion.Cholecystectomy. Normal: Bile duct diameter. Pancreas: Unremarkable. Spleen: Unremarkable. Adrenals/Urinary Tract: Negative adrenals. No hydronephrosis or stone. Unremarkable bladder. Reproductive:No pathologic findings. Stomach/Bowel: There is persistent inflammation around the thickened sigmoid colon related to a newly indistinct and thick walled diverticulum containing high-density debris. There is mild presumably reactive sigmoid mesentery adenopathy. No extraluminal gas or organized fluid collection. No associated bowel obstruction. Stable prominent mid duodenal fold thickness with tiny mid duodenal diverticulum. No appendicitis. Vascular/Lymphatic: No acute vascular abnormality. Chronic enlargement of deep liver drainage lymph  nodes, a reactive pattern. Peritoneal: Small volume pelvic fluid.  No pneumothorax. Musculoskeletal: No acute abnormalities. IMPRESSION: Sigmoid diverticulitis without abscess or pneumoperitoneum. Electronically Signed   By: Monte Fantasia M.D.   On: 02/17/2015 10:35   I have personally reviewed and evaluated these images and lab results as part of my medical decision-making.   EKG Interpretation None      MDM   Final diagnoses:  Diverticulitis of large intestine without perforation or abscess without bleeding    CT shows recurrent diverticulitis. No obvious current abscess or perforation. With her recently failing outpatient treatment with both Cipro/Flagyl, and Augmentin inpatient treatment is indicated. I discussed the case with hospitalist. She will be admitted. Given IV Unasyn here.    Tanna Furry, MD 02/17/15 [id number]

## 2015-02-17 NOTE — ED Notes (Signed)
History of diverticulosis, having lower abdominal pain.  Rates pain 8/10.

## 2015-02-17 NOTE — Progress Notes (Signed)
ANTIBIOTIC CONSULT NOTE - INITIAL  Pharmacy Consult for Unasyn Indication: intra-abdominal infection  Allergies  Allergen Reactions  . Flagyl [Metronidazole] Nausea Only  . Hydrocodone Nausea And Vomiting    Can take need to take with medication for nausea and vomitting   Patient Measurements: Height: 5\' 5"  (165.1 cm) Weight: 217 lb 9.6 oz (98.703 kg) IBW/kg (Calculated) : 57  Vital Signs: Temp: 98.3 F (36.8 C) (10/25 1401) Temp Source: Oral (10/25 1401) BP: 133/78 mmHg (10/25 1401) Pulse Rate: 63 (10/25 1401) Intake/Output from previous day:   Intake/Output from this shift:    Labs:  Recent Labs  02/17/15 0858  WBC 12.6*  HGB 14.5  PLT 301  CREATININE 0.71   Estimated Creatinine Clearance: 106.6 mL/min (by C-G formula based on Cr of 0.71). No results for input(s): VANCOTROUGH, VANCOPEAK, VANCORANDOM, GENTTROUGH, GENTPEAK, GENTRANDOM, TOBRATROUGH, TOBRAPEAK, TOBRARND, AMIKACINPEAK, AMIKACINTROU, AMIKACIN in the last 72 hours.   Microbiology: No results found for this or any previous visit (from the past 720 hour(s)).  Medical History: Past Medical History  Diagnosis Date  . Sciatica   . Carpal tunnel syndrome, bilateral   . PONV (postoperative nausea and vomiting)   . Diverticulitis    Anti-infectives    Start     Dose/Rate Route Frequency Ordered Stop   02/17/15 2000  ampicillin-sulbactam (UNASYN) 1.5 g in sodium chloride 0.9 % 50 mL IVPB     1.5 g 100 mL/hr over 30 Minutes Intravenous Every 6 hours 02/17/15 1830     02/17/15 1200  Ampicillin-Sulbactam (UNASYN) 3 g in sodium chloride 0.9 % 100 mL IVPB     3 g 100 mL/hr over 60 Minutes Intravenous  Once 02/17/15 1147 02/17/15 1310     Assessment: 44yo female with diverticulitis.  Asked to initiate Unasyn. Estimated Creatinine Clearance: 106.6 mL/min (by C-G formula based on Cr of 0.71).  Goal of Therapy:  Eradicate infection.  Plan:  Unasyn 1.5gm IV q6hrs Monitor labs, renal fxn, and c/s  [place] Crane,  Cully Luckow A 02/17/2015,6:32 PM

## 2015-02-17 NOTE — H&P (Signed)
[facility] History and Physical  [name] TDS:[id number] DOB: [birth year] DOA: 02/17/2015  Referring physician: Tanna Furry, MD PCP: No PCP Per Patient   Chief Complaint: Abdominal pain   HPI: [name] is a 44 y.o. female who was recently admitted to hospital in 12/2014 with diverticulitis. She had failed outpatient therapy at that time and had been admitted to the hospital with diverticulitis and associated abscess. She was treated with intravenous antibiotics and subsequently improved. She was discharged home has done fairly well over the past 3 weeks. 2 days ago she began developing significant diarrhea. She is unsure whether she's had any fevers. Yesterday she developed cramping, intermittent abdominal pain in her lower abdomen. She's had nausea but no vomiting. Her pain was unrelenting and so she came to the ER for evaluation. CT scan done in the emergency room indicated new diverticulitis. She's been referred for admission.  Review of Systems:  Pertinent positives as per HPI, otherwise negative   Past Medical History  Diagnosis Date  . Sciatica   . Carpal tunnel syndrome, bilateral   . PONV (postoperative nausea and vomiting)   . Diverticulitis    Past Surgical History  Procedure Laterality Date  . Right knee arthroscopy    . Knee surgery    . Tubal ligation    . Ablasion of uterus    . Cholecystectomy      [facility]  . Carpal tunnel release  11/10/2010    Procedure: CARPAL TUNNEL RELEASE;  Surgeon: Arther Abbott, MD;  Location: [facility];  Service: Orthopedics;  Laterality: Right;  . Colonoscopy N/A 03/28/2014    Procedure: COLONOSCOPY;  Surgeon: Rogene Houston, MD;  Location: [facility];  Service: Endoscopy;  Laterality: N/A;  730   Social History:  reports that she has been smoking Cigarettes.  She has a 26 pack-year smoking history. She has never used smokeless tobacco. She reports that she does not drink alcohol or use illicit drugs.  Allergies  Allergen  Reactions  . Flagyl [Metronidazole] Nausea Only  . Hydrocodone Nausea And Vomiting    Can take need to take with medication for nausea and vomitting    Family History  Problem Relation Age of Onset  . Cancer    . Diabetes    . Arthritis    . Asthma    . Diabetes type I Mother   . Hyperlipidemia Mother   . Diabetes type II Father   . Hypertension Father   . Hyperlipidemia Father   . Diabetes type II Brother   . Anesthesia problems Neg Hx   . Diabetes type II Brother     Prior to Admission medications   Medication Sig Start Date End Date Taking? Authorizing Provider  acetaminophen (TYLENOL) 325 MG tablet Take 2 tablets (650 mg total) by mouth every 6 (six) hours as needed for mild pain (or Fever >/= 101). 01/11/15  Yes Samuella Cota, MD  ibuprofen (ADVIL,MOTRIN) 200 MG tablet Take 3 tablets (600 mg total) by mouth every 8 (eight) hours as needed for moderate pain. For pain 01/11/15  Yes Samuella Cota, MD  ondansetron (ZOFRAN) 8 MG tablet Take 1 tablet (8 mg total) by mouth every 8 (eight) hours as needed for nausea or vomiting. 01/11/15  Yes Samuella Cota, MD  oxyCODONE (OXY IR/ROXICODONE) 5 MG immediate release tablet Take 1-2 tablets (5-10 mg total) by mouth every 4 (four) hours as needed for severe pain. 01/11/15  Yes Samuella Cota, MD  amoxicillin-clavulanate (AUGMENTIN) 875-125 MG per tablet Take 1 tablet by mouth every 12 (twelve) hours. Patient not taking: Reported on 02/17/2015 01/11/15   Samuella Cota, MD  nicotine (NICODERM CQ - DOSED IN MG/24 HOURS) 21 mg/24hr patch Place 1 patch (21 mg total) onto the skin daily. Patient not taking: Reported on 02/17/2015 01/11/15   Samuella Cota, MD  nystatin (MYCOSTATIN) 100000 UNIT/ML suspension Take 5 mLs (500,000 Units total) by mouth 4 (four) times daily. Patient not taking: Reported on 02/17/2015 01/11/15   Samuella Cota, MD   Physical Exam: Filed Vitals:   02/17/15 1200 02/17/15 1230 02/17/15 1329 02/17/15  1401  BP: 115/80 117/70 126/75 133/78  Pulse: 74 67 70 63  Temp:    98.3 F (36.8 C)  TempSrc:    Oral  Resp:   18   Height:    5\' 5"  (1.651 m)  Weight:    98.703 kg (217 lb 9.6 oz)  SpO2: 98% 97% 99% 98%    Wt Readings from Last 3 Encounters:  02/17/15 98.703 kg (217 lb 9.6 oz)  01/08/15 101.197 kg (223 lb 1.6 oz)  12/31/14 99.791 kg (220 lb)    General:  Appears calm and comfortable Eyes: PERRL, normal lids, irises & conjunctiva ENT: grossly normal hearing, lips & tongue Neck: no LAD, masses or thyromegaly Cardiovascular: RRR, no m/r/g. No LE edema. Telemetry: SR, no arrhythmias  Respiratory: CTA bilaterally, no w/r/r. Normal respiratory effort. Abdomen: soft, tenderness in the lower abdomen, bs+ Skin: no rash or induration seen on limited exam Musculoskeletal: grossly normal tone BUE/BLE Psychiatric: grossly normal mood and affect, speech fluent and appropriate Neurologic: grossly non-focal.          Labs on Admission:  Basic Metabolic Panel:  Recent Labs Lab 02/17/15 0858  NA 138  K 3.9  CL 103  CO2 24  GLUCOSE 153*  BUN 12  CREATININE 0.71  CALCIUM 9.3   Liver Function Tests:  Recent Labs Lab 02/17/15 0858  AST 33  ALT 54  ALKPHOS 94  BILITOT 0.8  PROT 7.8  ALBUMIN 4.4   CBC:  Recent Labs Lab 02/17/15 0858  WBC 12.6*  NEUTROABS 8.9*  HGB 14.5  HCT 43.4  MCV 87.3  PLT 301    Radiological Exams on Admission: Ct Abdomen Pelvis W Contrast  02/17/2015  CLINICAL DATA:  Left upper quadrant pain since Sunday. Recent diverticulitis EXAM: CT ABDOMEN AND PELVIS WITH CONTRAST TECHNIQUE: Multidetector CT imaging of the abdomen and pelvis was performed using the standard protocol following bolus administration of intravenous contrast. CONTRAST:  162mL OMNIPAQUE IOHEXOL 300 MG/ML  SOLN COMPARISON:  01/08/2015 FINDINGS: Lower chest and abdominal wall:  No contributory findings. Hepatobiliary: Hepatic steatosis. No focal lesion.Cholecystectomy. Normal:  Bile duct diameter. Pancreas: Unremarkable. Spleen: Unremarkable. Adrenals/Urinary Tract: Negative adrenals. No hydronephrosis or stone. Unremarkable bladder. Reproductive:No pathologic findings. Stomach/Bowel: There is persistent inflammation around the thickened sigmoid colon related to a newly indistinct and thick walled diverticulum containing high-density debris. There is mild presumably reactive sigmoid mesentery adenopathy. No extraluminal gas or organized fluid collection. No associated bowel obstruction. Stable prominent mid duodenal fold thickness with tiny mid duodenal diverticulum. No appendicitis. Vascular/Lymphatic: No acute vascular abnormality. Chronic enlargement of deep liver drainage lymph nodes, a reactive pattern. Peritoneal: Small volume pelvic fluid.  No pneumothorax. Musculoskeletal: No acute abnormalities. IMPRESSION: Sigmoid diverticulitis without abscess or pneumoperitoneum. Electronically Signed   By: Monte Fantasia M.D.   On: 02/17/2015 10:35    Assessment/Plan Active Problems:  Acute diverticulitis   Abdominal pain   Nausea without vomiting   1. Recurrent acute diverticulitis, revealed on CT A/P. We'll start the patient empirically on Unasyn. Will provide bowel rest and start her on clear liquids. Since she's had recurrent diverticulitis, will request general surgery consultation to see if colectomy is warranted. Continue pain management and antiemetics. 2. Abdominal pain. Related to #1. Continue with pain management. 3. Nausea, without vomiting. Continue antiemetics. Advanced diet as tolerated.   Code Status: full code DVT Prophylaxis: lovenox Family Communication: discussed with patient Disposition Plan: discharge home once imrpoved  Time spent: 72mins  Kathie Dike, MD [facility] Pager [phone]

## 2015-02-18 DIAGNOSIS — K5792 Diverticulitis of intestine, part unspecified, without perforation or abscess without bleeding: Secondary | ICD-10-CM

## 2015-02-18 LAB — CBC
HEMATOCRIT: 40.7 % (ref 36.0–46.0)
HEMOGLOBIN: 13.4 g/dL (ref 12.0–15.0)
MCH: 28.9 pg (ref 26.0–34.0)
MCHC: 32.9 g/dL (ref 30.0–36.0)
MCV: 87.9 fL (ref 78.0–100.0)
Platelets: 258 10*3/uL (ref 150–400)
RBC: 4.63 MIL/uL (ref 3.87–5.11)
RDW: 13.9 % (ref 11.5–15.5)
WBC: 9 10*3/uL (ref 4.0–10.5)

## 2015-02-18 LAB — COMPREHENSIVE METABOLIC PANEL
ALBUMIN: 3.4 g/dL — AB (ref 3.5–5.0)
ALK PHOS: 80 U/L (ref 38–126)
ALT: 64 U/L — AB (ref 14–54)
AST: 40 U/L (ref 15–41)
Anion gap: 5 (ref 5–15)
BILIRUBIN TOTAL: 0.8 mg/dL (ref 0.3–1.2)
BUN: 7 mg/dL (ref 6–20)
CALCIUM: 8.4 mg/dL — AB (ref 8.9–10.3)
CO2: 26 mmol/L (ref 22–32)
Chloride: 107 mmol/L (ref 101–111)
Creatinine, Ser: 0.57 mg/dL (ref 0.44–1.00)
GFR calc Af Amer: >60 mL/min (ref >60)
GFR calc non Af Amer: >60 mL/min (ref >60)
GLUCOSE: 136 mg/dL — AB (ref 65–99)
Potassium: 4 mmol/L (ref 3.5–5.1)
Sodium: 138 mmol/L (ref 135–145)
TOTAL PROTEIN: 6.4 g/dL — AB (ref 6.5–8.1)

## 2015-02-18 MED ORDER — NICOTINE 14 MG/24HR TD PT24
14.0000 mg | MEDICATED_PATCH | Freq: Every day | TRANSDERMAL | Status: DC
  Administered 2015-02-18 – 2015-02-20 (×3): 14 mg via TRANSDERMAL
  Filled 2015-02-18 (×3): qty 1

## 2015-02-18 MED ORDER — HYDROMORPHONE HCL 1 MG/ML IJ SOLN
1.0000 mg | INTRAMUSCULAR | Status: DC | PRN
  Administered 2015-02-18 (×2): 1 mg via INTRAVENOUS
  Filled 2015-02-18 (×2): qty 1

## 2015-02-18 MED ORDER — OXYCODONE-ACETAMINOPHEN 5-325 MG PO TABS: 1.0000 | ORAL_TABLET | Freq: Four times a day (QID) | ORAL | Status: DC | PRN

## 2015-02-18 NOTE — Progress Notes (Signed)
Patient still complains of 8/10 after receiving IV morphine.

## 2015-02-18 NOTE — Consult Note (Signed)
Reason for Consult: Recurrent sigmoid diverticulitis Referring Physician: Hospitalist  Bridget [name] is an 44 y.o. [name].  HPI: Patient is a 44 year old white [name] who I saw recently for sigmoid diverticulitis. She was supposed to see me in my office yesterday for ongoing follow-up of her diverticulitis which began experiencing lower abdominal pain several days ago. She presented the emergency room was noted to have a slightly increased white blood cell count. CT scan of the abdomen reveals sigmoid diverticulitis without abscess or evidence of perforation. This is in an area similar that was seen previously. Currently, the patient still has significant lower abdominal pain. She has been having some loose stools, though she has not had diarrhea. She had a colonoscopy within the last year.  Past Medical History  Diagnosis Date  . Sciatica   . Carpal tunnel syndrome, bilateral   . PONV (postoperative nausea and vomiting)   . Diverticulitis     Past Surgical History  Procedure Laterality Date  . Right knee arthroscopy    . Knee surgery    . Tubal ligation    . Ablasion of uterus    . Cholecystectomy      [facility]  . Carpal tunnel release  11/10/2010    Procedure: CARPAL TUNNEL RELEASE;  Surgeon: Arther Abbott, MD;  Location: [facility];  Service: Orthopedics;  Laterality: Right;  . Colonoscopy N/A 03/28/2014    Procedure: COLONOSCOPY;  Surgeon: Rogene Houston, MD;  Location: [facility];  Service: Endoscopy;  Laterality: N/A;  730    Family History  Problem Relation Age of Onset  . Cancer    . Diabetes    . Arthritis    . Asthma    . Diabetes type I Mother   . Hyperlipidemia Mother   . Diabetes type II Father   . Hypertension Father   . Hyperlipidemia Father   . Diabetes type II Brother   . Anesthesia problems Neg Hx   . Diabetes type II Brother     Social History:  reports that she has been smoking Cigarettes.  She has a 26 pack-year smoking history. She has never used  smokeless tobacco. She reports that she does not drink alcohol or use illicit drugs.  Allergies:  Allergies  Allergen Reactions  . Flagyl [Metronidazole] Nausea Only  . Hydrocodone Nausea And Vomiting    Can take need to take with medication for nausea and vomitting    Medications: I have reviewed the patient's current medications.  Results for orders placed or performed during the hospital encounter of 02/17/15 (from the past 48 hour(s))  Urinalysis, Routine w reflex microscopic (not at [facility])     Status: Abnormal   Collection Time: 02/17/15  8:47 AM  Result Value Ref Range   Color, Urine YELLOW YELLOW   APPearance HAZY (A) CLEAR   Specific Gravity, Urine 1.025 1.005 - 1.030   pH 5.5 5.0 - 8.0   Glucose, UA NEGATIVE NEGATIVE mg/dL   Hgb urine dipstick MODERATE (A) NEGATIVE   Bilirubin Urine SMALL (A) NEGATIVE   Ketones, ur NEGATIVE NEGATIVE mg/dL   Protein, ur NEGATIVE NEGATIVE mg/dL   Urobilinogen, UA 0.2 0.0 - 1.0 mg/dL   Nitrite NEGATIVE NEGATIVE   Leukocytes, UA NEGATIVE NEGATIVE  Urine microscopic-add on     Status: Abnormal   Collection Time: 02/17/15  8:47 AM  Result Value Ref Range   Squamous Epithelial / LPF MANY (A) RARE   RBC / HPF 3-6 <3 RBC/hpf   Bacteria, UA  FEW (A) RARE  CBC with Differential/Platelet     Status: Abnormal   Collection Time: 02/17/15  8:58 AM  Result Value Ref Range   WBC 12.6 (H) 4.0 - 10.5 K/uL   RBC 4.97 3.87 - 5.11 MIL/uL   Hemoglobin 14.5 12.0 - 15.0 g/dL   HCT 43.4 36.0 - 46.0 %   MCV 87.3 78.0 - 100.0 fL   MCH 29.2 26.0 - 34.0 pg   MCHC 33.4 30.0 - 36.0 g/dL   RDW 13.9 11.5 - 15.5 %   Platelets 301 150 - 400 K/uL   Neutrophils Relative % 71 %   Neutro Abs 8.9 (H) 1.7 - 7.7 K/uL   Lymphocytes Relative 22 %   Lymphs Abs 2.8 0.7 - 4.0 K/uL   Monocytes Relative 4 %   Monocytes Absolute 0.6 0.1 - 1.0 K/uL   Eosinophils Relative 2 %   Eosinophils Absolute 0.2 0.0 - 0.7 K/uL   Basophils Relative 1 %   Basophils Absolute 0.1 0.0 -  0.1 K/uL  Comprehensive metabolic panel     Status: Abnormal   Collection Time: 02/17/15  8:58 AM  Result Value Ref Range   Sodium 138 135 - 145 mmol/L   Potassium 3.9 3.5 - 5.1 mmol/L   Chloride 103 101 - 111 mmol/L   CO2 24 22 - 32 mmol/L   Glucose, Bld 153 (H) 65 - 99 mg/dL   BUN 12 6 - 20 mg/dL   Creatinine, Ser 0.71 0.44 - 1.00 mg/dL   Calcium 9.3 8.9 - 10.3 mg/dL   Total Protein 7.8 6.5 - 8.1 g/dL   Albumin 4.4 3.5 - 5.0 g/dL   AST 33 15 - 41 U/L   ALT 54 14 - 54 U/L   Alkaline Phosphatase 94 38 - 126 U/L   Total Bilirubin 0.8 0.3 - 1.2 mg/dL   GFR calc non Af Amer >60 >60 mL/min   GFR calc Af Amer >60 >60 mL/min    Comment: (NOTE) The eGFR has been calculated using the CKD EPI equation. This calculation has not been validated in all clinical situations. eGFR's persistently <60 mL/min signify possible Chronic Kidney Disease.    Anion gap 11 5 - 15  CBC     Status: None   Collection Time: 02/18/15  7:06 AM  Result Value Ref Range   WBC 9.0 4.0 - 10.5 K/uL   RBC 4.63 3.87 - 5.11 MIL/uL   Hemoglobin 13.4 12.0 - 15.0 g/dL   HCT 40.7 36.0 - 46.0 %   MCV 87.9 78.0 - 100.0 fL   MCH 28.9 26.0 - 34.0 pg   MCHC 32.9 30.0 - 36.0 g/dL   RDW 13.9 11.5 - 15.5 %   Platelets 258 150 - 400 K/uL  Comprehensive metabolic panel     Status: Abnormal   Collection Time: 02/18/15  7:06 AM  Result Value Ref Range   Sodium 138 135 - 145 mmol/L   Potassium 4.0 3.5 - 5.1 mmol/L   Chloride 107 101 - 111 mmol/L   CO2 26 22 - 32 mmol/L   Glucose, Bld 136 (H) 65 - 99 mg/dL   BUN 7 6 - 20 mg/dL   Creatinine, Ser 0.57 0.44 - 1.00 mg/dL   Calcium 8.4 (L) 8.9 - 10.3 mg/dL   Total Protein 6.4 (L) 6.5 - 8.1 g/dL   Albumin 3.4 (L) 3.5 - 5.0 g/dL   AST 40 15 - 41 U/L   ALT 64 (H) 14 - 54 U/L  Alkaline Phosphatase 80 38 - 126 U/L   Total Bilirubin 0.8 0.3 - 1.2 mg/dL   GFR calc non Af Amer >60 >60 mL/min   GFR calc Af Amer >60 >60 mL/min    Comment: (NOTE) The eGFR has been calculated using  the CKD EPI equation. This calculation has not been validated in all clinical situations. eGFR's persistently <60 mL/min signify possible Chronic Kidney Disease.    Anion gap 5 5 - 15    Ct Abdomen Pelvis W Contrast  02/17/2015  CLINICAL DATA:  Left upper quadrant pain since Sunday. Recent diverticulitis EXAM: CT ABDOMEN AND PELVIS WITH CONTRAST TECHNIQUE: Multidetector CT imaging of the abdomen and pelvis was performed using the standard protocol following bolus administration of intravenous contrast. CONTRAST:  146m OMNIPAQUE IOHEXOL 300 MG/ML  SOLN COMPARISON:  01/08/2015 FINDINGS: Lower chest and abdominal wall:  No contributory findings. Hepatobiliary: Hepatic steatosis. No focal lesion.Cholecystectomy. Normal: Bile duct diameter. Pancreas: Unremarkable. Spleen: Unremarkable. Adrenals/Urinary Tract: Negative adrenals. No hydronephrosis or stone. Unremarkable bladder. Reproductive:No pathologic findings. Stomach/Bowel: There is persistent inflammation around the thickened sigmoid colon related to a newly indistinct and thick walled diverticulum containing high-density debris. There is mild presumably reactive sigmoid mesentery adenopathy. No extraluminal gas or organized fluid collection. No associated bowel obstruction. Stable prominent mid duodenal fold thickness with tiny mid duodenal diverticulum. No appendicitis. Vascular/Lymphatic: No acute vascular abnormality. Chronic enlargement of deep liver drainage lymph nodes, a reactive pattern. Peritoneal: Small volume pelvic fluid.  No pneumothorax. Musculoskeletal: No acute abnormalities. IMPRESSION: Sigmoid diverticulitis without abscess or pneumoperitoneum. Electronically Signed   By: JMonte FantasiaM.D.   On: 02/17/2015 10:35    ROS: See chart Blood pressure 135/58, pulse 51, temperature 98.1 F (36.7 C), temperature source Oral, resp. rate 17, height 5' 5" (1.651 m), weight 98.703 kg (217 lb 9.6 oz), SpO2 97 %. Physical Exam: Pleasant white  [name] in no acute distress. Abdomen is soft with tenderness noted to palpation in the left lower quadrant and suprapubic region. No rigidity is noted. Bowel sounds are active.  Assessment/Plan: Impression: Recurrent sigmoid diverticulitis, failure of medical therapy. Plan: Would continue bowel rest and IV antibiotics. I have told patient that once this current episode has improved, we should go ahead and schedule elective partial colectomy as an outpatient. She is in agreement. Would continue current management for now.  JENKINS,MARK A 02/18/2015, 9:50 AM

## 2015-02-18 NOTE — Progress Notes (Signed)
PROGRESS NOTE  [name] ASN:[id number] DOB: 01/20/1971 DOA: 02/17/2015 PCP: No PCP Per Patient  Summary: 75 yof who was recently admitted to hospital in 12/2014 with diverticulitis presented with diarrhea, lower abdominal pain/cramping and nausea. While in the ED, CT scan revealed new diverticulitis. She was admitted for further management.    Assessment/Plan: 1. Recurrent acute diverticulitis, revealed on CT A/P. Continue bowel rest, IV abx, and clear liquids. General surgery consulted and recommends continuing IV abx and scheduling an elective partial colectomy as an outpatient once she is improved.   2. Abdominal pain, minimally improved. Related to #1. Continue with pain management. 3. Nausea, without vomiting. Improving with antiemetics. Advanced diet as tolerated.   Overall improving. Continue IV abx, pain management, and antiemetics.  Code Status: Full DVT prophylaxis: Lovenox Family Communication: Family at bedside. Discussed with patient who understands and has no concerns at this time. Disposition Plan: Anticipate discharge within 1-2 days.   Murray Hodgkins, MD  [facility]  Pager [phone] If 7PM-7AM, please contact night-coverage at [email or web address], password [facility] 02/18/2015, 7:49 AM  LOS: 1 day   Consultants:  General surgery  Procedures:    Antibiotics:  Unasyn 10/25>>  HPI/Subjective: Lower abdominal pain is bad. Has some nausea but denies any vomiting or bleeding.   Objective: Filed Vitals:   02/17/15 1329 02/17/15 1401 02/17/15 2217 02/18/15 0613  BP: 126/75 133/78 119/61 135/58  Pulse: 70 63 48 51  Temp:  98.3 F (36.8 C) 98.2 F (36.8 C) 98.1 F (36.7 C)  TempSrc:  Oral Oral Oral  Resp: 18  20 17   Height:  5\' 5"  (1.651 m)    Weight:  98.703 kg (217 lb 9.6 oz)    SpO2: 99% 98% 99% 97%    Intake/Output Summary (Last 24 hours) at 02/18/15 0749 Last data filed at 02/17/15 1800  Gross per 24 hour  Intake    240 ml  Output      0 ml    Net    240 ml     Filed Weights   02/17/15 0806 02/17/15 1401  Weight: 95.255 kg (210 lb) 98.703 kg (217 lb 9.6 oz)    Exam:    Afebrile, not hypoxic General:  Appears comfortable, calm. Cardiovascular: Regular rate and rhythm, no murmur, rub or gallop. No lower extremity edema. Respiratory: Clear to auscultation bilaterally, no wheezes, rales or rhonchi. Normal respiratory effort. Abdomen: soft, nd, mild lower tenderness Psychiatric: grossly normal mood and affect, speech fluent and appropriate  New data reviewed:  WBC normalized at 9, CBC unremarkable  BMP and LFTs unremarkable.   Pertinent data since admission:  WBC 12.6  Abdominal CT IMPRESSION: Sigmoid diverticulitis without abscess or pneumoperitoneum.  Pending data:    Scheduled Meds: . ampicillin-sulbactam (UNASYN) IV  1.5 g Intravenous Q6H  . enoxaparin (LOVENOX) injection  40 mg Subcutaneous Q24H  . nicotine  14 mg Transdermal Daily   Continuous Infusions: . sodium chloride 100 mL/hr at 02/18/15 0100    Principal Problem:   Acute diverticulitis Active Problems:   Abdominal pain   Nausea without vomiting   Time spent 20 minutes   By signing my name below, I, Rosalie Doctor attest that this documentation has been prepared under the direction and in the presence of Murray Hodgkins, MD Electronically signed: Rosalie Doctor, Scribe. 02/18/2015 1:29pm  I personally performed the services described in this documentation. All medical record entries made by the scribe were at my direction. I have reviewed the chart and agree  that the record reflects my personal performance and is accurate and complete. Murray Hodgkins, MD

## 2015-02-18 NOTE — Care Management Note (Signed)
Case Management Note  Patient Details  Name: DELLA SCRIVENER MRN: [id number] Date of Birth: Jan 04, 1971  Subjective/Objective:                  Pt admitted from home with diverticulitis. Pt lives with family and will return home at discharge. Pt is independent with ADL's.  Action/Plan: Will give pt list of MD in the area taking new pts. Pt will need to call to talk with MD to schedule follow up appt. No other CM needs anticipated.  Expected Discharge Date:                  Expected Discharge Plan:  Home/Self Care  In-House Referral:  NA  Discharge planning Services  CM Consult  Post Acute Care Choice:  NA Choice offered to:  NA  DME Arranged:    DME Agency:     HH Arranged:    HH Agency:     Status of Service:  Completed, signed off  Medicare Important Message Given:    Date Medicare IM Given:    Medicare IM give by:    Date Additional Medicare IM Given:    Additional Medicare Important Message give by:     If discussed at [place] of Stay Meetings, dates discussed:    Additional Comments:  Joylene Draft, RN 02/18/2015, 10:37 AM

## 2015-02-19 DIAGNOSIS — R109 Unspecified abdominal pain: Secondary | ICD-10-CM

## 2015-02-19 DIAGNOSIS — R110 Nausea: Secondary | ICD-10-CM

## 2015-02-19 MED ORDER — OXYCODONE HCL 5 MG PO TABS
5.0000 mg | ORAL_TABLET | ORAL | Status: DC | PRN
  Administered 2015-02-19 – 2015-02-20 (×4): 5 mg via ORAL
  Filled 2015-02-19 (×4): qty 1

## 2015-02-19 NOTE — Progress Notes (Signed)
  Subjective: Has had bowel movements. Abdominal pain has significantly eased. Tolerating clear liquid diet well.  Objective: Vital signs in last 24 hours: Temp:  [97.6 F (36.4 C)-98.6 F (37 C)] 98.6 F (37 C) (10/27 1324) Pulse Rate:  [50-69] 69 (10/27 0632) Resp:  [18-20] 20 (10/27 4010) BP: (133-140)/(59-88) 133/59 mmHg (10/27 0632) SpO2:  [99 %-100 %] 100 % (10/27 2725) Last BM Date: 02/18/15  Intake/Output from previous day: 10/26 0701 - 10/27 0700 In: 1260 [P.O.:960; IV Piggyback:300] Out: 200 [Urine:200] Intake/Output this shift:    General appearance: alert, cooperative and no distress GI: Soft, bowel sounds active. Minimal left lower quadrant abdominal pain noted. No rigidity noted.  Lab Results:   Recent Labs  02/17/15 0858 02/18/15 0706  WBC 12.6* 9.0  HGB 14.5 13.4  HCT 43.4 40.7  PLT 301 258   BMET  Recent Labs  02/17/15 0858 02/18/15 0706  NA 138 138  K 3.9 4.0  CL 103 107  CO2 24 26  GLUCOSE 153* 136*  BUN 12 7  CREATININE 0.71 0.57  CALCIUM 9.3 8.4*   PT/INR No results for input(s): LABPROT, INR in the last 72 hours.  Studies/Results: Ct Abdomen Pelvis W Contrast  02/17/2015  CLINICAL DATA:  Left upper quadrant pain since Sunday. Recent diverticulitis EXAM: CT ABDOMEN AND PELVIS WITH CONTRAST TECHNIQUE: Multidetector CT imaging of the abdomen and pelvis was performed using the standard protocol following bolus administration of intravenous contrast. CONTRAST:  100mL OMNIPAQUE IOHEXOL 300 MG/ML  SOLN COMPARISON:  01/08/2015 FINDINGS: Lower chest and abdominal wall:  No contributory findings. Hepatobiliary: Hepatic steatosis. No focal lesion.Cholecystectomy. Normal: Bile duct diameter. Pancreas: Unremarkable. Spleen: Unremarkable. Adrenals/Urinary Tract: Negative adrenals. No hydronephrosis or stone. Unremarkable bladder. Reproductive:No pathologic findings. Stomach/Bowel: There is persistent inflammation around the thickened sigmoid colon  related to a newly indistinct and thick walled diverticulum containing high-density debris. There is mild presumably reactive sigmoid mesentery adenopathy. No extraluminal gas or organized fluid collection. No associated bowel obstruction. Stable prominent mid duodenal fold thickness with tiny mid duodenal diverticulum. No appendicitis. Vascular/Lymphatic: No acute vascular abnormality. Chronic enlargement of deep liver drainage lymph nodes, a reactive pattern. Peritoneal: Small volume pelvic fluid.  No pneumothorax. Musculoskeletal: No acute abnormalities. IMPRESSION: Sigmoid diverticulitis without abscess or pneumoperitoneum. Electronically Signed   By: Jonathon  Watts M.D.   On: 02/17/2015 10:35    Anti-infectives: Anti-infectives    Start     Dose/Rate Route Frequency Ordered Stop   02/17/15 2000  ampicillin-sulbactam (UNASYN) 1.5 g in sodium chloride 0.9 % 50 mL IVPB     1.5 g 100 mL/hr over 30 Minutes Intravenous Every 6 hours 02/17/15 1830     10 /25/16 1200  Ampicillin-Sulbactam (UNASYN) 3 g in sodium chloride 0.9 % 100 mL IVPB     3 g 100 mL/hr over 60 Minutes Intravenous  Once 02/17/15 1147 02/17/15 1310      Assessment/Plan: Impression: Sigmoid diverticulitis, resolving. Plan: We'll advance to soft diet. Continue IV antibiotics for now. Anticipate discharge in next 24-48 hours.  LOS: 2 days    [name] A 02/19/2015

## 2015-02-19 NOTE — Progress Notes (Signed)
  PROGRESS NOTE  [name] GOT:[id number] DOB: Sep 23, 1970 DOA: 02/17/2015 PCP: No PCP Per Patient  Summary: 76 yof who was recently admitted to hospital in 12/2014 with diverticulitis presented with diarrhea, lower abdominal pain/cramping and nausea. While in the ED, CT scan revealed new diverticulitis. She was admitted for further management.    Assessment/Plan: 1. Recurrent acute diverticulitis, revealed on CT A/P. Improving. Patient tolerating clear liquid diet. General surgery recommends elective partial colectomy as outpatient.   2. Abdominal pain, improving. Related to #1. Continue with pain management. 3. Nausea, without vomiting. Improving with antiemetics. Advanced diet as tolerated.   Overall improving. Continue IV abx, pain management, and antiemetics.  Advance to soft diet as tolerated.   Code Status: Full DVT prophylaxis: Lovenox Family Communication:  Discussed with patient who understands and has no concerns at this time. Disposition Plan: Anticipate discharge within 24-48 hours.   Murray Hodgkins, MD  [facility]  Pager [phone] If 7PM-7AM, please contact night-coverage at [email or web address], password [facility] 02/19/2015, 7:24 AM  LOS: 2 days   Consultants:  General surgery  Procedures:    Antibiotics:  Unasyn 10/25>>  HPI/Subjective: Feels better. Pain has eased and nausea is improved. Denies any vomiting. Has has BMs.   Objective: Filed Vitals:   02/18/15 0613 02/18/15 1520 02/18/15 2152 02/19/15 0632  BP: 135/58 140/88 137/68 133/59  Pulse: 51 53 50 69  Temp: 98.1 F (36.7 C) 98.5 F (36.9 C) 97.6 F (36.4 C) 98.6 F (37 C)  TempSrc: Oral Oral Oral Oral  Resp: 17 18  20   Height:      Weight:      SpO2: 97% 99% 100% 100%    Intake/Output Summary (Last 24 hours) at 02/19/15 0724 Last data filed at 02/19/15 0618  Gross per 24 hour  Intake   1260 ml  Output    200 ml  Net   1060 ml     Filed Weights   02/17/15 0806 02/17/15 1401    Weight: 95.255 kg (210 lb) 98.703 kg (217 lb 9.6 oz)    Exam:    Afebrile, not hypoxic General:  Appears calm and comfortable Cardiovascular: RRR, no m/r/g. No LE edema. Respiratory: CTA bilaterally, no w/r/r. Normal respiratory effort. Abdomen: soft, ntnd Psychiatric: grossly normal mood and affect, speech fluent and appropriate  New data reviewed:  No new data  Pertinent data since admission:  WBC 12.6  Abdominal CT IMPRESSION: Sigmoid diverticulitis without abscess or pneumoperitoneum.  Pending data:  none  Scheduled Meds: . ampicillin-sulbactam (UNASYN) IV  1.5 g Intravenous Q6H  . enoxaparin (LOVENOX) injection  40 mg Subcutaneous Q24H  . nicotine  14 mg Transdermal Daily   Continuous Infusions:    Principal Problem:   Acute diverticulitis Active Problems:   Abdominal pain   Nausea without vomiting   Time spent 15 minutes   By signing my name below, I, Rosalie Doctor attest that this documentation has been prepared under the direction and in the presence of Murray Hodgkins, MD Electronically signed: Rosalie Doctor, Scribe. 02/19/2015  2:12pm  I personally performed the services described in this documentation. All medical record entries made by the scribe were at my direction. I have reviewed the chart and agree that the record reflects my personal performance and is accurate and complete. Murray Hodgkins, MD

## 2015-02-20 DIAGNOSIS — R1030 Lower abdominal pain, unspecified: Secondary | ICD-10-CM

## 2015-02-20 MED ORDER — AMOXICILLIN-POT CLAVULANATE 875-125 MG PO TABS: 1.0000 | ORAL_TABLET | Freq: Two times a day (BID) | ORAL | Status: DC

## 2015-02-20 MED ORDER — OXYCODONE HCL 5 MG PO TABS: 5.0000 mg | ORAL_TABLET | ORAL | Status: DC | PRN

## 2015-02-20 MED ORDER — NICOTINE 14 MG/24HR TD PT24: 14.0000 mg | MEDICATED_PATCH | Freq: Every day | TRANSDERMAL | Status: DC

## 2015-02-20 NOTE — Discharge Instructions (Signed)
Diverticulitis °Diverticulitis is inflammation or infection of small pouches in your colon that form when you have a condition called diverticulosis. The pouches in your colon are called diverticula. Your colon, or large intestine, is where water is absorbed and stool is formed. °Complications of diverticulitis can include: °· Bleeding. °· Severe infection. °· Severe pain. °· Perforation of your colon. °· Obstruction of your colon. °CAUSES  °Diverticulitis is caused by bacteria. °Diverticulitis happens when stool becomes trapped in diverticula. This allows bacteria to grow in the diverticula, which can lead to inflammation and infection. °RISK FACTORS °People with diverticulosis are at risk for diverticulitis. Eating a diet that does not include enough fiber from fruits and vegetables may make diverticulitis more likely to develop. °SYMPTOMS  °Symptoms of diverticulitis may include: °· Abdominal pain and tenderness. The pain is normally located on the left side of the abdomen, but may occur in other areas. °· Fever and chills. °· Bloating. °· Cramping. °· Nausea. °· Vomiting. °· Constipation. °· Diarrhea. °· Blood in your stool. °DIAGNOSIS  °Your health care provider will ask you about your medical history and do a physical exam. You may need to have tests done because many medical conditions can cause the same symptoms as diverticulitis. Tests may include: °· Blood tests. °· Urine tests. °· Imaging tests of the abdomen, including X-rays and CT scans. °When your condition is under control, your health care provider may recommend that you have a colonoscopy. A colonoscopy can show how severe your diverticula are and whether something else is causing your symptoms. °TREATMENT  °Most cases of diverticulitis are mild and can be treated at home. Treatment may include: °· Taking over-the-counter pain medicines. °· Following a clear liquid diet. °· Taking antibiotic medicines by mouth for 7-10 days. °More severe cases may  be treated at a hospital. Treatment may include: °· Not eating or drinking. °· Taking prescription pain medicine. °· Receiving antibiotic medicines through an IV tube. °· Receiving fluids and nutrition through an IV tube. °· Surgery. °HOME CARE INSTRUCTIONS  °· Follow your health care provider's instructions carefully. °· Follow a full liquid diet or other diet as directed by your health care provider. After your symptoms improve, your health care provider may tell you to change your diet. He or she may recommend you eat a high-fiber diet. Fruits and vegetables are good sources of fiber. Fiber makes it easier to pass stool. °· Take fiber supplements or probiotics as directed by your health care provider. °· Only take medicines as directed by your health care provider. °· Keep all your follow-up appointments. °SEEK MEDICAL CARE IF:  °· Your pain does not improve. °· You have a hard time eating food. °· Your bowel movements do not return to normal. °SEEK IMMEDIATE MEDICAL CARE IF:  °· Your pain becomes worse. °· Your symptoms do not get better. °· Your symptoms suddenly get worse. °· You have a fever. °· You have repeated vomiting. °· You have bloody or black, tarry stools. °MAKE SURE YOU:  °· Understand these instructions. °· Will watch your condition. °· Will get help right away if you are not doing well or get worse. °  °This information is not intended to replace advice given to you by your health care provider. Make sure you discuss any questions you have with your health care provider. °  °Document Released: 01/19/2005 Document Revised: 04/16/2013 Document Reviewed: 03/06/2013 °Elsevier Interactive Patient Education ©2016 [employer] ° °

## 2015-02-20 NOTE — Discharge Summary (Signed)
Physician Discharge Summary  [name] EYC:[id number] DOB: 10-Jun-1970 DOA: 02/17/2015  PCP: No PCP Per Patient  Admit date: 02/17/2015 Discharge date: 02/20/2015  Recommendations for Outpatient Follow-up:  1. Follow up with Dr. Arnoldo Morale in 2 weeks to schedule outpatient elective partial colectomy.   Follow-up Information    Follow up with [name], MD On 03/03/2015.   Specialty:  General Surgery   Contact information:   [id number] [place] [address] [phone]      Discharge Diagnoses:  1. Recurrent acute diverticulitis. 2. Abdominal pain. 3. Nausea, without vomiting. 4. Tobacco use disorder  Discharge Condition: improved Disposition: Home  Diet recommendation: Regular  Filed Weights   02/17/15 0806 02/17/15 1401  Weight: 95.255 kg (210 lb) 98.703 kg (217 lb 9.6 oz)    History of present illness:  40 yof who was recently admitted to hospital in 12/2014 with diverticulitis presented with diarrhea, lower abdominal pain/cramping and nausea. While in the ED, CT scan revealed new diverticulitis. She was admitted for further management.   Hospital Course:  Recurrent acute diverticulitis with nausea and abdominal pain was treated with IV fluids, IV abx, pain management, and antiemetics. General surgery followed and recommended scheduling an elective partial colectomy as an outpatient. Hospitalization was uncomplicated.  Individual issues as below:  1. Recurrent acute diverticulitis. Essentially resolved. Patient tolerating soft diet. General surgery recommends elective partial colectomy as outpatient.  2. Abdominal pain, improved. Related to #1.  3. Nausea, without vomiting. Resolved.  Consultants:  General surgery  Procedures:  none  Antibiotics:  Unasyn 10/25>>10/28  Augmentin 10/28>>11/6  Discharge Instructions Discharge Instructions    Activity as tolerated - No restrictions    Complete by:  As directed      Diet general     Complete by:  As directed      Discharge instructions    Complete by:  As directed   Call your physician or seek immediate medical attention for pain, fever, vomiting or worsening of condition.            Current Discharge Medication List    START taking these medications   Details  amoxicillin-clavulanate (AUGMENTIN) 875-125 MG tablet Take 1 tablet by mouth every 12 (twelve) hours. Qty: 20 tablet, Refills: 0    nicotine (NICODERM CQ - DOSED IN MG/24 HOURS) 14 mg/24hr patch Place 1 patch (14 mg total) onto the skin daily. Qty: 28 patch, Refills: 0      CONTINUE these medications which have CHANGED   Details  oxyCODONE (OXY IR/ROXICODONE) 5 MG immediate release tablet Take 1 tablet (5 mg total) by mouth every 4 (four) hours as needed for severe pain. Qty: 30 tablet, Refills: 0      CONTINUE these medications which have NOT CHANGED   Details  acetaminophen (TYLENOL) 325 MG tablet Take 2 tablets (650 mg total) by mouth every 6 (six) hours as needed for mild pain (or Fever >/= 101).    ondansetron (ZOFRAN) 8 MG tablet Take 1 tablet (8 mg total) by mouth every 8 (eight) hours as needed for nausea or vomiting. Qty: 15 tablet, Refills: 0      STOP taking these medications     ibuprofen (ADVIL,MOTRIN) 200 MG tablet        Allergies  Allergen Reactions  . Flagyl [Metronidazole] Nausea Only  . Hydrocodone Nausea And Vomiting    Can take need to take with medication for nausea and vomitting    The results of significant diagnostics from this  hospitalization (including imaging, microbiology, ancillary and laboratory) are listed below for reference.    Significant Diagnostic Studies: Ct Abdomen Pelvis W Contrast  02/17/2015  CLINICAL DATA:  Left upper quadrant pain since Sunday. Recent diverticulitis EXAM: CT ABDOMEN AND PELVIS WITH CONTRAST TECHNIQUE: Multidetector CT imaging of the abdomen and pelvis was performed using the standard protocol following bolus administration of  intravenous contrast. CONTRAST:  16mL OMNIPAQUE IOHEXOL 300 MG/ML  SOLN COMPARISON:  01/08/2015 FINDINGS: Lower chest and abdominal wall:  No contributory findings. Hepatobiliary: Hepatic steatosis. No focal lesion.Cholecystectomy. Normal: Bile duct diameter. Pancreas: Unremarkable. Spleen: Unremarkable. Adrenals/Urinary Tract: Negative adrenals. No hydronephrosis or stone. Unremarkable bladder. Reproductive:No pathologic findings. Stomach/Bowel: There is persistent inflammation around the thickened sigmoid colon related to a newly indistinct and thick walled diverticulum containing high-density debris. There is mild presumably reactive sigmoid mesentery adenopathy. No extraluminal gas or organized fluid collection. No associated bowel obstruction. Stable prominent mid duodenal fold thickness with tiny mid duodenal diverticulum. No appendicitis. Vascular/Lymphatic: No acute vascular abnormality. Chronic enlargement of deep liver drainage lymph nodes, a reactive pattern. Peritoneal: Small volume pelvic fluid.  No pneumothorax. Musculoskeletal: No acute abnormalities. IMPRESSION: Sigmoid diverticulitis without abscess or pneumoperitoneum. Electronically Signed   By: Monte Fantasia M.D.   On: 02/17/2015 10:35    Labs: Basic Metabolic Panel:  Recent Labs Lab 02/17/15 0858 02/18/15 0706  NA 138 138  K 3.9 4.0  CL 103 107  CO2 24 26  GLUCOSE 153* 136*  BUN 12 7  CREATININE 0.71 0.57  CALCIUM 9.3 8.4*   Liver Function Tests:  Recent Labs Lab 02/17/15 0858 02/18/15 0706  AST 33 40  ALT 54 64*  ALKPHOS 94 80  BILITOT 0.8 0.8  PROT 7.8 6.4*  ALBUMIN 4.4 3.4*   CBC:  Recent Labs Lab 02/17/15 0858 02/18/15 0706  WBC 12.6* 9.0  NEUTROABS 8.9*  --   HGB 14.5 13.4  HCT 43.4 40.7  MCV 87.3 87.9  PLT 301 258   Principal Problem:   Acute diverticulitis Active Problems:   Abdominal pain   Nausea without vomiting   Time coordinating discharge: 25 minutes  Signed:  Murray Hodgkins, MD [facility] 02/20/2015, 9:48 AM  By signing my name below, I, Rosalie Doctor attest that this documentation has been prepared under the direction and in the presence of Murray Hodgkins, MD Electronically signed: Rosalie Doctor, Scribe. 02/20/2015  I personally performed the services described in this documentation. All medical record entries made by the scribe were at my direction. I have reviewed the chart and agree that the record reflects my personal performance and is accurate and complete. Murray Hodgkins, MD

## 2015-02-20 NOTE — Progress Notes (Signed)
  Subjective: Patient feels much better and desires discharge.  Objective: Vital signs in last 24 hours: Temp:  [97.4 F (36.3 C)-98.5 F (36.9 C)] 98.5 F (36.9 C) (10/28 0545) Pulse Rate:  [52-56] 52 (10/28 0545) Resp:  [18-20] 20 (10/28 0545) BP: (127-130)/(61-70) 127/61 mmHg (10/28 0545) SpO2:  [98 %-99 %] 99 % (10/28 0545) Last BM Date: 02/19/15  Intake/Output from previous day: 10/27 0701 - 10/28 0700 In: 680 [P.O.:480; IV Piggyback:200] Out: 600 [Urine:600] Intake/Output this shift:    General appearance: alert, cooperative and no distress GI: soft, non-tender; bowel sounds normal; no masses,  no organomegaly  Lab Results:   Recent Labs  02/17/15 0858 02/18/15 0706  WBC 12.6* 9.0  HGB 14.5 13.4  HCT 43.4 40.7  PLT 301 258   BMET  Recent Labs  02/17/15 0858 02/18/15 0706  NA 138 138  K 3.9 4.0  CL 103 107  CO2 24 26  GLUCOSE 153* 136*  BUN 12 7  CREATININE 0.71 0.57  CALCIUM 9.3 8.4*   PT/INR No results for input(s): LABPROT, INR in the last 72 hours.  Studies/Results: No results found.  Anti-infectives: Anti-infectives    Start     Dose/Rate Route Frequency Ordered Stop   02/17/15 2000  ampicillin-sulbactam (UNASYN) 1.5 g in sodium chloride 0.9 % 50 mL IVPB     1.5 g 100 mL/hr over 30 Minutes Intravenous Every 6 hours 02/17/15 1830     02/17/15 1200  Ampicillin-Sulbactam (UNASYN) 3 g in sodium chloride 0.9 % 100 mL IVPB     3 g 100 mL/hr over 60 Minutes Intravenous  Once 02/17/15 1147 02/17/15 1310      Assessment/Plan: Impression: Sigmoid diverticulitis, resolving Plan: We'll see patient in my office in 2 weeks. Would discharge home on 10 days of Augmentin. Will arrange for outpatient elective colectomy at that time.  LOS: 3 days    Elmarie Devlin A 02/20/2015

## 2015-02-20 NOTE — Progress Notes (Signed)
  PROGRESS NOTE  [name] HER:[id number] DOB: 18-Jun-1970 DOA: 02/17/2015 PCP: No PCP Per Patient  Summary: 43 yof who was recently admitted to hospital in 12/2014 with diverticulitis presented with diarrhea, lower abdominal pain/cramping and nausea. While in the ED, CT scan revealed new diverticulitis. She was admitted for further management.    Assessment/Plan: 1. Recurrent acute diverticulitis. Essentially resolved. Patient tolerating soft diet. General surgery recommends elective partial colectomy as outpatient.   2. Abdominal pain, improved. Related to #1.   3. Nausea, without vomiting. Resolved.   Overall improving. Discharge home today.   Code Status: Full DVT prophylaxis: Lovenox Family Communication:  Discussed with patient who understands and has no concerns at this time. Disposition Plan: Discharge today.   Murray Hodgkins, MD  [facility]  Pager [phone] If 7PM-7AM, please contact night-coverage at [email or web address], password [email or web address] 02/20/2015, 7:01 AM  LOS: 3 days   Consultants:  General surgery  Procedures:    Antibiotics:  Unasyn 10/25>>10/28  Augmentin 10/28>>11/6  HPI/Subjective: Feels much improved. Has some residual pain but denies any nausea or vomiting.   Objective: Filed Vitals:   02/19/15 0632 02/19/15 1400 02/19/15 2105 02/20/15 0545  BP: 133/59 130/64 127/70 127/61  Pulse: 69 56 52 52  Temp: 98.6 F (37 C) 98.1 F (36.7 C) 97.4 F (36.3 C) 98.5 F (36.9 C)  TempSrc: Oral Oral Oral Oral  Resp: 20 18 20 20   Height:      Weight:      SpO2: 100% 99% 98% 99%    Intake/Output Summary (Last 24 hours) at 02/20/15 0701 Last data filed at 02/20/15 0646  Gross per 24 hour  Intake    680 ml  Output    600 ml  Net     80 ml     Filed Weights   02/17/15 0806 02/17/15 1401  Weight: 95.255 kg (210 lb) 98.703 kg (217 lb 9.6 oz)    Exam:    Afebrile, not hypoxic General:  Appears calm and comfortable Cardiovascular: RRR, no  m/r/g. No LE edema. Respiratory: CTA bilaterally, no w/r/r. Normal respiratory effort. Abdomen: soft, ntnd, positive BS.  Psychiatric: grossly normal mood and affect, speech fluent and appropriate  New data reviewed:  No new data  Pertinent data since admission:  WBC 12.6  Abdominal CT IMPRESSION: Sigmoid diverticulitis without abscess or pneumoperitoneum.  Pending data:  none  Scheduled Meds: . ampicillin-sulbactam (UNASYN) IV  1.5 g Intravenous Q6H  . enoxaparin (LOVENOX) injection  40 mg Subcutaneous Q24H  . nicotine  14 mg Transdermal Daily   Continuous Infusions:    Principal Problem:   Acute diverticulitis Active Problems:   Abdominal pain   Nausea without vomiting    By signing my name below, I, [name] attest that this documentation has been prepared under the direction and in the presence of Murray Hodgkins, MD Electronically signed: Rosalie [name], Scribe. 02/20/2015  9:43am  I personally performed the services described in this documentation. All medical record entries made by the scribe were at my direction. I have reviewed the chart and agree that the record reflects my personal performance and is accurate and complete. Murray Hodgkins, MD

## 2015-02-20 NOTE — Care Management Note (Signed)
Case Management Note  Patient Details  Name: [name] MRN: [id number] Date of Birth: [birth year]  Subjective/Objective:                    Action/Plan:   Expected Discharge Date:                  Expected Discharge Plan:  Home/Self Care  In-House Referral:  NA  Discharge planning Services  CM Consult  Post Acute Care Choice:  NA Choice offered to:  NA  DME Arranged:    DME Agency:     HH Arranged:    [place] Agency:     Status of Service:  Completed, signed off  Medicare Important Message Given:    Date Medicare IM Given:    Medicare IM give by:    Date Additional Medicare IM Given:    Additional Medicare Important Message give by:     If discussed at [place] of Stay Meetings, dates discussed:    Additional Comments: Pt discharged home today. No CM needs noted. Christinia Gully Grandin, RN 02/20/2015, 11:14 AM

## 2015-03-25 NOTE — H&P (Signed)
[name] is an 44 y.o. female.   Chief Complaint: Sigmoid diverticulitis HPI: Patient is a 44 year old white female with a history of sigmoid diverticulitis. She has had multiple episodes of left lower quadrant abdominal pain and hospitalization for IV antibiotics. She now presents for elective partial colectomy.  Past Medical History  Diagnosis Date  . Sciatica   . Carpal tunnel syndrome, bilateral   . PONV (postoperative nausea and vomiting)   . Diverticulitis     Past Surgical History  Procedure Laterality Date  . Right knee arthroscopy    . Knee surgery    . Tubal ligation    . Ablasion of uterus    . Cholecystectomy      [facility]  . Carpal tunnel release  11/10/2010    Procedure: CARPAL TUNNEL RELEASE;  Surgeon: Arther Abbott, MD;  Location: [facility] ORS;  Service: Orthopedics;  Laterality: Right;  . Colonoscopy N/A 03/28/2014    Procedure: COLONOSCOPY;  Surgeon: Rogene Houston, MD;  Location: [facility] SU[facility];  Service: Endoscopy;  Laterality: N/A;  730    Family History  Problem Relation Age of Onset  . Cancer    . Diabetes    . Arthritis    . Asthma    . Diabetes type I Mother   . Hyperlipidemia Mother   . Diabetes type II Father   . Hypertension Father   . Hyperlipidemia Father   . Diabetes type II Brother   . Anesthesia problems Neg Hx   . Diabetes type II Brother    Social History:  reports that she has been smoking Cigarettes.  She has a 26 pack-year smoking history. She has never used smokeless tobacco. She reports that she does not drink alcohol or use illicit drugs.  Allergies:  Allergies  Allergen Reactions  . Flagyl [Metronidazole] Nausea Only  . Hydrocodone Nausea And Vomiting    Can take need to take with medication for nausea and vomitting    No prescriptions prior to admission    No results found for this or any previous visit (from the past 48 hour(s)). No results found.  Review of Systems  Constitutional: Negative.   HENT: Negative.   Eyes:  Negative.   Respiratory: Negative.   Cardiovascular: Negative.   Gastrointestinal: Positive for abdominal pain.  Genitourinary: Negative.   Musculoskeletal: Negative.   Skin: Negative.     There were no vitals taken for this visit. Physical Exam  Constitutional: She is oriented to person, place, and time. She appears well-developed and well-nourished.  HENT:  Head: Normocephalic and atraumatic.  Neck: Normal range of motion. Neck supple.  Cardiovascular: Normal rate, regular rhythm and normal heart sounds.   Respiratory: Effort normal and breath sounds normal.  GI: Soft. Bowel sounds are normal. She exhibits no distension. There is no tenderness. There is no rebound.  Neurological: She is alert and oriented to person, place, and time.  Skin: Skin is warm and dry.     Assessment/Plan Impression: Recurrent sigmoid diverticulitis Plan: Patient be taken to the operating room on 04/08/2015 for a laparoscopic hand-assisted partial colectomy. The risks and benefits of the procedure including bleeding, infection, anastomotic leak, and the possibility of a blood transfusion were fully explained to the patient, who gave informed consent.  [name] A 03/25/2015, 9:10 AM

## 2015-04-02 NOTE — Patient Instructions (Signed)
    [name]  04/02/2015     @PREFPERIOPPHARMACY @   Your procedure is scheduled on 04/08/15.  Report to [facility] at 7:00 A.M.  Call this number if you have problems the morning of surgery:  [phone]   Remember:  Do not eat food or drink liquids after midnight.  Take these medicines the morning of surgery with A SIP OF WATER ZOFRAN, OXYCODONE - IF NEEDED   Do not wear jewelry, make-up or nail polish.  Do not wear lotions, powders, or perfumes.  You may wear deodorant.  Do not shave 48 hours prior to surgery.  Men may shave face and neck.  Do not bring valuables to the hospital.  [facility] is not responsible for any belongings or valuables.  Contacts, dentures or bridgework may not be worn into surgery.  Leave your suitcase in the car.  After surgery it may be brought to your room.  For patients admitted to the hospital, discharge time will be determined by your treatment team.  Patients discharged the day of surgery will not be allowed to drive home.    Please read over the following fact sheets that you were given. Surgical Site Infection Prevention and Anesthesia Post-op Instructions     PATIENT INSTRUCTIONS POST-ANESTHESIA  IMMEDIATELY FOLLOWING SURGERY:  Do not drive or operate machinery for the first twenty four hours after surgery.  Do not make any important decisions for twenty four hours after surgery or while taking narcotic pain medications or sedatives.  If you develop intractable nausea and vomiting or a severe headache please notify your doctor immediately.  FOLLOW-UP:  Please make an appointment with your surgeon as instructed. You do not need to follow up with anesthesia unless specifically instructed to do so.  WOUND CARE INSTRUCTIONS (if applicable):  Keep a dry clean dressing on the anesthesia/puncture wound site if there is drainage.  Once the wound has quit draining you may leave it open to air.  Generally you should leave the bandage intact for  twenty four hours unless there is drainage.  If the epidural site drains for more than 36-48 hours please call the anesthesia department.  QUESTIONS?:  Please feel free to call your physician or the hospital operator if you have any questions, and they will be happy to assist you.

## 2015-04-03 ENCOUNTER — Encounter (HOSPITAL_COMMUNITY): Payer: Self-pay

## 2015-04-03 ENCOUNTER — Encounter (HOSPITAL_COMMUNITY)
Admission: RE | Admit: 2015-04-03 | Discharge: 2015-04-03 | Disposition: A | Discharge status: Home / Self Care | Payer: BLUE CROSS/BLUE SHIELD | Source: Ambulatory Visit | Attending: General Surgery | Admitting: General Surgery

## 2015-04-03 DIAGNOSIS — Z01818 Encounter for other preprocedural examination: Secondary | ICD-10-CM | POA: Insufficient documentation

## 2015-04-03 DIAGNOSIS — K5792 Diverticulitis of intestine, part unspecified, without perforation or abscess without bleeding: Secondary | ICD-10-CM | POA: Diagnosis not present

## 2015-04-03 LAB — BASIC METABOLIC PANEL
Anion gap: 16 — ABNORMAL HIGH (ref 5–15)
BUN: 13 mg/dL (ref 6–20)
CHLORIDE: 104 mmol/L (ref 101–111)
CO2: 20 mmol/L — ABNORMAL LOW (ref 22–32)
CREATININE: 0.58 mg/dL (ref 0.44–1.00)
Calcium: 9.4 mg/dL (ref 8.9–10.3)
GFR calc Af Amer: >60 mL/min (ref >60)
GFR calc non Af Amer: >60 mL/min (ref >60)
GLUCOSE: 77 mg/dL (ref 65–99)
POTASSIUM: 4.2 mmol/L (ref 3.5–5.1)
SODIUM: 140 mmol/L (ref 135–145)

## 2015-04-03 LAB — CBC WITH DIFFERENTIAL/PLATELET
Basophils Absolute: 0.1 10*3/uL (ref 0.0–0.1)
Basophils Relative: 0 %
Eosinophils Absolute: 0.4 10*3/uL (ref 0.0–0.7)
Eosinophils Relative: 3 %
HEMATOCRIT: 43.5 % (ref 36.0–46.0)
Hemoglobin: 14.7 g/dL (ref 12.0–15.0)
LYMPHS PCT: 36 %
Lymphs Abs: 4.6 10*3/uL — ABNORMAL HIGH (ref 0.7–4.0)
MCH: 29.3 pg (ref 26.0–34.0)
MCHC: 33.8 g/dL (ref 30.0–36.0)
MCV: 86.7 fL (ref 78.0–100.0)
MONO ABS: 0.8 10*3/uL (ref 0.1–1.0)
MONOS PCT: 6 %
NEUTROS ABS: 7 10*3/uL (ref 1.7–7.7)
Neutrophils Relative %: 55 %
Platelets: 372 10*3/uL (ref 150–400)
RBC: 5.02 MIL/uL (ref 3.87–5.11)
RDW: 14.7 % (ref 11.5–15.5)
WBC: 12.8 10*3/uL — ABNORMAL HIGH (ref 4.0–10.5)

## 2015-04-04 LAB — PREPARE RBC (CROSSMATCH)

## 2015-04-07 LAB — ABO/RH: ABO/RH(D): B POS

## 2015-04-08 ENCOUNTER — Inpatient Hospital Stay (HOSPITAL_COMMUNITY)
Admission: RE | Admit: 2015-04-08 | Discharge: 2015-04-12 | DRG: 331 | Disposition: A | Discharge status: Home / Self Care | Payer: BLUE CROSS/BLUE SHIELD | Source: Ambulatory Visit | Attending: General Surgery | Admitting: General Surgery

## 2015-04-08 ENCOUNTER — Inpatient Hospital Stay (HOSPITAL_COMMUNITY): Payer: BLUE CROSS/BLUE SHIELD | Admitting: Anesthesiology

## 2015-04-08 ENCOUNTER — Encounter (HOSPITAL_COMMUNITY)
Admission: RE | Disposition: A | Discharge status: Home / Self Care | Payer: Self-pay | Source: Ambulatory Visit | Attending: General Surgery

## 2015-04-08 ENCOUNTER — Encounter (HOSPITAL_COMMUNITY): Payer: Self-pay | Admitting: *Deleted

## 2015-04-08 DIAGNOSIS — K5792 Diverticulitis of intestine, part unspecified, without perforation or abscess without bleeding: Secondary | ICD-10-CM | POA: Diagnosis present

## 2015-04-08 DIAGNOSIS — Z833 Family history of diabetes mellitus: Secondary | ICD-10-CM

## 2015-04-08 DIAGNOSIS — F17210 Nicotine dependence, cigarettes, uncomplicated: Secondary | ICD-10-CM | POA: Diagnosis present

## 2015-04-08 DIAGNOSIS — Z5331 Laparoscopic surgical procedure converted to open procedure: Secondary | ICD-10-CM | POA: Diagnosis not present

## 2015-04-08 DIAGNOSIS — Z825 Family history of asthma and other chronic lower respiratory diseases: Secondary | ICD-10-CM

## 2015-04-08 DIAGNOSIS — Z8249 Family history of ischemic heart disease and other diseases of the circulatory system: Secondary | ICD-10-CM | POA: Diagnosis not present

## 2015-04-08 DIAGNOSIS — K5732 Diverticulitis of large intestine without perforation or abscess without bleeding: Principal | ICD-10-CM | POA: Diagnosis present

## 2015-04-08 HISTORY — PX: PARTIAL COLECTOMY: SHX5273

## 2015-04-08 HISTORY — PX: COLON RESECTION: SHX5231

## 2015-04-08 SURGERY — COLECTOMY, PARTIAL
Anesthesia: General | Site: Abdomen

## 2015-04-08 MED ORDER — ACETAMINOPHEN 650 MG RE SUPP: 650.0000 mg | Freq: Four times a day (QID) | RECTAL | Status: DC | PRN

## 2015-04-08 MED ORDER — ALBUTEROL SULFATE HFA 108 (90 BASE) MCG/ACT IN AERS
INHALATION_SPRAY | RESPIRATORY_TRACT | Status: DC | PRN
  Administered 2015-04-08: 2 via RESPIRATORY_TRACT

## 2015-04-08 MED ORDER — ROCURONIUM BROMIDE 50 MG/5ML IV SOLN
INTRAVENOUS | Status: AC
  Filled 2015-04-08: qty 1

## 2015-04-08 MED ORDER — POVIDONE-IODINE 10 % EX OINT
TOPICAL_OINTMENT | CUTANEOUS | Status: AC
  Filled 2015-04-08: qty 1

## 2015-04-08 MED ORDER — GLYCOPYRROLATE 0.2 MG/ML IJ SOLN
INTRAMUSCULAR | Status: AC
  Filled 2015-04-08: qty 3

## 2015-04-08 MED ORDER — SODIUM CHLORIDE 0.9 % IR SOLN
Status: DC | PRN
  Administered 2015-04-08 (×2): 1000 mL

## 2015-04-08 MED ORDER — CEFOTETAN DISODIUM-DEXTROSE 2-2.08 GM-% IV SOLR
2.0000 g | INTRAVENOUS | Status: AC
  Administered 2015-04-08: 2 g via INTRAVENOUS

## 2015-04-08 MED ORDER — DEXAMETHASONE SODIUM PHOSPHATE 4 MG/ML IJ SOLN
4.0000 mg | Freq: Once | INTRAMUSCULAR | Status: AC
  Administered 2015-04-08: 4 mg via INTRAVENOUS

## 2015-04-08 MED ORDER — LIDOCAINE HCL (PF) 1 % IJ SOLN
INTRAMUSCULAR | Status: AC
  Filled 2015-04-08: qty 5

## 2015-04-08 MED ORDER — LACTATED RINGERS IV SOLN
INTRAVENOUS | Status: DC
  Administered 2015-04-08 (×3): via INTRAVENOUS

## 2015-04-08 MED ORDER — KETOROLAC TROMETHAMINE 30 MG/ML IJ SOLN
INTRAMUSCULAR | Status: AC
  Filled 2015-04-08: qty 1

## 2015-04-08 MED ORDER — EPHEDRINE SULFATE 50 MG/ML IJ SOLN
INTRAMUSCULAR | Status: AC
  Filled 2015-04-08: qty 1

## 2015-04-08 MED ORDER — ALVIMOPAN 12 MG PO CAPS
12.0000 mg | ORAL_CAPSULE | Freq: Two times a day (BID) | ORAL | Status: DC
  Administered 2015-04-09 – 2015-04-10 (×4): 12 mg via ORAL
  Filled 2015-04-08 (×4): qty 1

## 2015-04-08 MED ORDER — ALVIMOPAN 12 MG PO CAPS
12.0000 mg | ORAL_CAPSULE | Freq: Once | ORAL | Status: AC
  Administered 2015-04-08: 12 mg via ORAL

## 2015-04-08 MED ORDER — MIDAZOLAM HCL 2 MG/2ML IJ SOLN
1.0000 mg | INTRAMUSCULAR | Status: DC | PRN
  Administered 2015-04-08: 2 mg via INTRAVENOUS

## 2015-04-08 MED ORDER — DEXAMETHASONE SODIUM PHOSPHATE 4 MG/ML IJ SOLN
INTRAMUSCULAR | Status: AC
Start: 2015-04-08 — End: 2015-04-08
  Filled 2015-04-08: qty 1

## 2015-04-08 MED ORDER — LACTATED RINGERS IV SOLN
INTRAVENOUS | Status: DC
  Administered 2015-04-08 – 2015-04-09 (×2): 1 mL via INTRAVENOUS
  Administered 2015-04-09 – 2015-04-10 (×3): via INTRAVENOUS

## 2015-04-08 MED ORDER — ONDANSETRON HCL 4 MG/2ML IJ SOLN: 4.0000 mg | Freq: Once | INTRAMUSCULAR | Status: DC | PRN

## 2015-04-08 MED ORDER — PROPOFOL 10 MG/ML IV BOLUS
INTRAVENOUS | Status: DC | PRN
  Administered 2015-04-08: 200 mg via INTRAVENOUS

## 2015-04-08 MED ORDER — BUPIVACAINE LIPOSOME 1.3 % IJ SUSP
INTRAMUSCULAR | Status: AC
  Filled 2015-04-08: qty 20

## 2015-04-08 MED ORDER — ONDANSETRON HCL 4 MG/2ML IJ SOLN
INTRAMUSCULAR | Status: AC
  Filled 2015-04-08: qty 2

## 2015-04-08 MED ORDER — ENOXAPARIN SODIUM 40 MG/0.4ML ~~LOC~~ SOLN
40.0000 mg | SUBCUTANEOUS | Status: DC
  Administered 2015-04-09 – 2015-04-12 (×4): 40 mg via SUBCUTANEOUS
  Filled 2015-04-08 (×4): qty 0.4

## 2015-04-08 MED ORDER — FENTANYL CITRATE (PF) 250 MCG/5ML IJ SOLN
INTRAMUSCULAR | Status: AC
  Filled 2015-04-08: qty 10

## 2015-04-08 MED ORDER — GLYCOPYRROLATE 0.2 MG/ML IJ SOLN
INTRAMUSCULAR | Status: DC | PRN
  Administered 2015-04-08: 0.6 mg via INTRAVENOUS

## 2015-04-08 MED ORDER — MIDAZOLAM HCL 2 MG/2ML IJ SOLN
INTRAMUSCULAR | Status: AC
  Filled 2015-04-08: qty 2

## 2015-04-08 MED ORDER — ALBUTEROL SULFATE HFA 108 (90 BASE) MCG/ACT IN AERS
INHALATION_SPRAY | RESPIRATORY_TRACT | Status: AC
  Filled 2015-04-08: qty 6.7

## 2015-04-08 MED ORDER — SIMETHICONE 80 MG PO CHEW
40.0000 mg | CHEWABLE_TABLET | Freq: Four times a day (QID) | ORAL | Status: DC | PRN
  Administered 2015-04-11 (×3): 40 mg via ORAL
  Filled 2015-04-08 (×3): qty 1

## 2015-04-08 MED ORDER — ONDANSETRON HCL 4 MG/2ML IJ SOLN
4.0000 mg | Freq: Once | INTRAMUSCULAR | Status: AC
  Administered 2015-04-08: 4 mg via INTRAVENOUS

## 2015-04-08 MED ORDER — ROCURONIUM BROMIDE 100 MG/10ML IV SOLN
INTRAVENOUS | Status: DC | PRN
  Administered 2015-04-08: 35 mg via INTRAVENOUS
  Administered 2015-04-08: 5 mg via INTRAVENOUS
  Administered 2015-04-08 (×2): 10 mg via INTRAVENOUS

## 2015-04-08 MED ORDER — ENOXAPARIN SODIUM 40 MG/0.4ML ~~LOC~~ SOLN
40.0000 mg | Freq: Once | SUBCUTANEOUS | Status: AC
  Administered 2015-04-08: 40 mg via SUBCUTANEOUS

## 2015-04-08 MED ORDER — SODIUM CHLORIDE 0.9 % IJ SOLN
INTRAMUSCULAR | Status: AC
  Filled 2015-04-08: qty 10

## 2015-04-08 MED ORDER — PROPOFOL 10 MG/ML IV BOLUS
INTRAVENOUS | Status: AC
  Filled 2015-04-08: qty 20

## 2015-04-08 MED ORDER — DEXTROSE 5 % IV SOLN
1.0000 g | Freq: Two times a day (BID) | INTRAVENOUS | Status: DC
  Administered 2015-04-08 – 2015-04-12 (×8): 1 g via INTRAVENOUS
  Filled 2015-04-08 (×10): qty 1

## 2015-04-08 MED ORDER — NEOSTIGMINE METHYLSULFATE 10 MG/10ML IV SOLN
INTRAVENOUS | Status: DC | PRN
  Administered 2015-04-08: 2 mg via INTRAVENOUS
  Administered 2015-04-08: 1 mg via INTRAVENOUS

## 2015-04-08 MED ORDER — LORAZEPAM 2 MG/ML IJ SOLN
1.0000 mg | INTRAMUSCULAR | Status: DC | PRN
Start: 2015-04-08 — End: 2015-04-12

## 2015-04-08 MED ORDER — ACETAMINOPHEN 325 MG PO TABS: 650.0000 mg | ORAL_TABLET | Freq: Four times a day (QID) | ORAL | Status: DC | PRN

## 2015-04-08 MED ORDER — MIDAZOLAM HCL 5 MG/5ML IJ SOLN
INTRAMUSCULAR | Status: DC | PRN
  Administered 2015-04-08: 2 mg via INTRAVENOUS

## 2015-04-08 MED ORDER — NEOSTIGMINE METHYLSULFATE 10 MG/10ML IV SOLN
INTRAVENOUS | Status: AC
  Filled 2015-04-08: qty 1

## 2015-04-08 MED ORDER — ENOXAPARIN SODIUM 40 MG/0.4ML ~~LOC~~ SOLN
SUBCUTANEOUS | Status: AC
  Filled 2015-04-08: qty 0.4

## 2015-04-08 MED ORDER — OXYCODONE-ACETAMINOPHEN 5-325 MG PO TABS
1.0000 | ORAL_TABLET | ORAL | Status: DC | PRN
  Administered 2015-04-09 – 2015-04-10 (×3): 1 via ORAL
  Administered 2015-04-10 – 2015-04-12 (×9): 2 via ORAL
  Filled 2015-04-08: qty 1
  Filled 2015-04-08 (×2): qty 2
  Filled 2015-04-08 (×2): qty 1
  Filled 2015-04-08: qty 2
  Filled 2015-04-08: qty 1
  Filled 2015-04-08 (×6): qty 2

## 2015-04-08 MED ORDER — NICOTINE 14 MG/24HR TD PT24
14.0000 mg | MEDICATED_PATCH | Freq: Every day | TRANSDERMAL | Status: DC
  Administered 2015-04-09 – 2015-04-11 (×3): 14 mg via TRANSDERMAL
  Filled 2015-04-08 (×4): qty 1

## 2015-04-08 MED ORDER — FENTANYL CITRATE (PF) 100 MCG/2ML IJ SOLN: 25.0000 ug | INTRAMUSCULAR | Status: DC | PRN

## 2015-04-08 MED ORDER — CEFOTETAN DISODIUM-DEXTROSE 2-2.08 GM-% IV SOLR
INTRAVENOUS | Status: AC
  Filled 2015-04-08: qty 50

## 2015-04-08 MED ORDER — ONDANSETRON 4 MG PO TBDP
4.0000 mg | ORAL_TABLET | Freq: Four times a day (QID) | ORAL | Status: DC | PRN
  Administered 2015-04-10: 4 mg via ORAL
  Filled 2015-04-08: qty 1

## 2015-04-08 MED ORDER — CHLORHEXIDINE GLUCONATE 4 % EX LIQD: 1.0000 "application " | Freq: Once | CUTANEOUS | Status: DC

## 2015-04-08 MED ORDER — BUPIVACAINE LIPOSOME 1.3 % IJ SUSP
INTRAMUSCULAR | Status: DC | PRN
  Administered 2015-04-08: 20 mL

## 2015-04-08 MED ORDER — POVIDONE-IODINE 10 % OINT PACKET
TOPICAL_OINTMENT | CUTANEOUS | Status: DC | PRN
  Administered 2015-04-08: 1 via TOPICAL

## 2015-04-08 MED ORDER — KETOROLAC TROMETHAMINE 30 MG/ML IJ SOLN
30.0000 mg | Freq: Once | INTRAMUSCULAR | Status: AC
  Administered 2015-04-08: 30 mg via INTRAVENOUS

## 2015-04-08 MED ORDER — ALVIMOPAN 12 MG PO CAPS
ORAL_CAPSULE | ORAL | Status: AC
  Filled 2015-04-08: qty 1

## 2015-04-08 MED ORDER — FENTANYL CITRATE (PF) 100 MCG/2ML IJ SOLN
INTRAMUSCULAR | Status: DC | PRN
  Administered 2015-04-08: 50 ug via INTRAVENOUS
  Administered 2015-04-08: 100 ug via INTRAVENOUS
  Administered 2015-04-08 (×4): 50 ug via INTRAVENOUS

## 2015-04-08 MED ORDER — BUPIVACAINE HCL (PF) 0.5 % IJ SOLN
INTRAMUSCULAR | Status: AC
  Filled 2015-04-08: qty 30

## 2015-04-08 MED ORDER — LIDOCAINE HCL 1 % IJ SOLN
INTRAMUSCULAR | Status: DC | PRN
  Administered 2015-04-08: 30 mg via INTRADERMAL

## 2015-04-08 MED ORDER — ONDANSETRON HCL 4 MG/2ML IJ SOLN
4.0000 mg | Freq: Four times a day (QID) | INTRAMUSCULAR | Status: DC | PRN
  Administered 2015-04-08 – 2015-04-10 (×7): 4 mg via INTRAVENOUS
  Filled 2015-04-08 (×8): qty 2

## 2015-04-08 MED ORDER — HYDROMORPHONE HCL 1 MG/ML IJ SOLN
1.0000 mg | INTRAMUSCULAR | Status: DC | PRN
  Administered 2015-04-08 – 2015-04-09 (×7): 1 mg via INTRAVENOUS
  Filled 2015-04-08 (×7): qty 1

## 2015-04-08 MED ORDER — SUCCINYLCHOLINE CHLORIDE 20 MG/ML IJ SOLN
INTRAMUSCULAR | Status: DC | PRN
  Administered 2015-04-08: 130 mg via INTRAVENOUS

## 2015-04-08 SURGICAL SUPPLY — 73 items
BAG HAMPER (MISCELLANEOUS) ×3 IMPLANT
BLADE 10 SAFETY STRL DISP (BLADE) ×3 IMPLANT
BLADE HEX COATED 2.75 (ELECTRODE) ×3 IMPLANT
CHLORAPREP W/TINT 26ML (MISCELLANEOUS) ×3 IMPLANT
CLOTH BEACON ORANGE TIMEOUT ST (SAFETY) ×3 IMPLANT
COVER LIGHT HANDLE STERIS (MISCELLANEOUS) ×6 IMPLANT
COVER MAYO STAND XLG (DRAPE) ×3 IMPLANT
DECANTER SPIKE VIAL GLASS SM (MISCELLANEOUS) ×1 IMPLANT
DRAPE INCISE IOBAN 44X35 STRL (DRAPES) ×3 IMPLANT
DRAPE WARM FLUID 44X44 (DRAPE) ×3 IMPLANT
DRSG OPSITE POSTOP 4X8 (GAUZE/BANDAGES/DRESSINGS) ×3 IMPLANT
ELECT BLADE 6 FLAT ULTRCLN (ELECTRODE) ×1 IMPLANT
ELECT REM PT RETURN 9FT ADLT (ELECTROSURGICAL) ×3
ELECTRODE REM PT RTRN 9FT ADLT (ELECTROSURGICAL) ×2 IMPLANT
FILTER SMOKE EVAC LAPAROSHD (FILTER) ×3 IMPLANT
GLOVE BIOGEL PI IND STRL 7.0 (GLOVE) ×2 IMPLANT
GLOVE BIOGEL PI INDICATOR 7.0 (GLOVE) ×1
GLOVE SURG SS PI 7.5 STRL IVOR (GLOVE) ×9 IMPLANT
GOWN STRL REUS W/ TWL XL LVL3 (GOWN DISPOSABLE) ×4 IMPLANT
GOWN STRL REUS W/TWL LRG LVL3 (GOWN DISPOSABLE) ×12 IMPLANT
GOWN STRL REUS W/TWL XL LVL3 (GOWN DISPOSABLE) ×6
INST SET LAPROSCOPIC AP (KITS) ×3 IMPLANT
INST SET MAJOR GENERAL (KITS) ×3 IMPLANT
IV NS IRRIG 3000ML ARTHROMATIC (IV SOLUTION) IMPLANT
KIT ROOM TURNOVER AP CYSTO (KITS) IMPLANT
LIGASURE 5MM LAPAROSCOPIC (INSTRUMENTS) IMPLANT
LIGASURE IMPACT 36 18CM CVD LR (INSTRUMENTS) ×3 IMPLANT
LIGASURE LAP ATLAS 10MM 37CM (INSTRUMENTS) ×1 IMPLANT
MANIFOLD NEPTUNE II (INSTRUMENTS) ×3 IMPLANT
NDL HYPO 18GX1.5 BLUNT FILL (NEEDLE) IMPLANT
NEEDLE HYPO 18GX1.5 BLUNT FILL (NEEDLE) IMPLANT
NS IRRIG 1000ML POUR BTL (IV SOLUTION) ×3 IMPLANT
PACK LAP CHOLE LZT030E (CUSTOM PROCEDURE TRAY) ×3 IMPLANT
PAD ARMBOARD 7.5X6 YLW CONV (MISCELLANEOUS) ×3 IMPLANT
PENCIL HANDSWITCHING (ELECTRODE) ×6 IMPLANT
RELOAD LINEAR CUT PROX 55 BLUE (ENDOMECHANICALS) IMPLANT
RELOAD PROXIMATE 75MM BLUE (ENDOMECHANICALS) ×12 IMPLANT
RELOAD STAPLE 55 3.8 BLU REG (ENDOMECHANICALS) IMPLANT
RELOAD STAPLE 75 3.8 BLU REG (ENDOMECHANICALS) IMPLANT
RETRACTOR WND ALEXIS 25 LRG (MISCELLANEOUS) IMPLANT
RTRCTR WOUND ALEXIS 25CM LRG (MISCELLANEOUS) ×3
SET BASIN LINEN APH (SET/KITS/TRAYS/PACK) ×3 IMPLANT
SET TUBE IRRIG SUCTION NO TIP (IRRIGATION / IRRIGATOR) IMPLANT
SHEET LAVH (DRAPES) IMPLANT
SPONGE GAUZE 2X2 8PLY STRL LF (GAUZE/BANDAGES/DRESSINGS) ×6 IMPLANT
SPONGE LAP 18X18 X RAY DECT (DISPOSABLE) ×6 IMPLANT
STAPLER GUN LINEAR PROX 60 (STAPLE) ×3 IMPLANT
STAPLER PROXIMATE 55 BLUE (STAPLE) IMPLANT
STAPLER PROXIMATE 75MM BLUE (STAPLE) ×1 IMPLANT
STAPLER VISISTAT (STAPLE) ×3 IMPLANT
SUCTION POOLE TIP (SUCTIONS) ×3 IMPLANT
SUT CHROMIC 0 CT 1 (SUTURE) ×3 IMPLANT
SUT CHROMIC 2 0 SH (SUTURE) IMPLANT
SUT PDS AB 0 CTX 60 (SUTURE) ×6 IMPLANT
SUT PDS AB CT VIOLET #0 27IN (SUTURE) IMPLANT
SUT SILK 2 0 (SUTURE)
SUT SILK 2-0 18XBRD TIE 12 (SUTURE) IMPLANT
SUT SILK 3 0 SH CR/8 (SUTURE) ×4 IMPLANT
SUT VIC AB 0 CT1 27 (SUTURE)
SUT VIC AB 0 CT1 27XCR 8 STRN (SUTURE) IMPLANT
SUT VIC AB 2-0 CT2 27 (SUTURE) IMPLANT
SUT VICRYL 0 UR6 27IN ABS (SUTURE) ×2 IMPLANT
SYS LAPSCP GELPORT 120MM (MISCELLANEOUS) ×3
SYSTEM LAPSCP GELPORT 120MM (MISCELLANEOUS) ×2 IMPLANT
TAPE CLOTH SURG 4X10 WHT LF (GAUZE/BANDAGES/DRESSINGS) ×1 IMPLANT
TRAY FOLEY CATH SILVER 16FR (SET/KITS/TRAYS/PACK) ×3 IMPLANT
TROCAR ENDO BLADELESS 11MM (ENDOMECHANICALS) ×3 IMPLANT
TROCAR XCEL NON-BLD 5MMX100MML (ENDOMECHANICALS) IMPLANT
TROCAR XCEL UNIV SLVE 11M 100M (ENDOMECHANICALS) ×3 IMPLANT
TUBING INSUF HEATED (TUBING) ×3 IMPLANT
WARMER LAPAROSCOPE (MISCELLANEOUS) ×3 IMPLANT
YANKAUER SUCT BULB TIP 10FT TU (MISCELLANEOUS) ×6 IMPLANT
YANKAUER SUCT BULB TIP NO VENT (SUCTIONS) ×1 IMPLANT

## 2015-04-08 NOTE — Op Note (Signed)
Patient:  [name]  DOB:  [birth year]  MRN:  [id number]   Preop Diagnosis:  Sigmoid diverticulitis  Postop Diagnosis:  Same  Procedure:  Partial colectomy  Surgeon:  Aviva Signs, M.D.  Anes:  Gen. endotracheal  Indications:  Patient is a 44 year old white female who presents with recurrent episodes of distal sigmoid diverticulitis. The risks and benefits of the procedure including bleeding, infection, cardiopulmonary difficulties, anastomotic leak, and the possibility of an open procedure were fully explained to the patient, who gave informed consent.  Procedure note:  The patient was placed in the low lithotomy position. After induction of general endotracheal anesthesia, the abdomen and perineum were prepped and draped using usual sterile technique with ChloraPrep. Surgical site confirmation was performed.  A small midline incision was made at the level of the umbilicus towards the suprapubic region. The peritoneal cavity was entered into without difficulty. A GelPort was then inserted. An additional 11 mm trocar was placed left lower quadrant region under direct palpation and another 11 mm trocar was placed the right upper quadrant region. The abdomen was then insufflated to 16 mmHg pressure. I attempted to perform the dissection along the descending colon and sigmoid colon regions. Due to her body habitus, the laparoscopic approach was not feasible. It was elected to proceed with an open procedure. Through the midline incision, we proceeded with the partial colectomy. Sigmoid colon was mobilized along its peritoneal reflection. Care was taken to avoid the left ureter. The uterus and ovaries were noted to be within normal limits. A hard indurated area was noted in the distal sigmoid colon. This was freed away without difficulty. A GIA-75 stapler was placed across the mid sigmoid colon and fired. This was likewise done at the colorectal juncture. The mesentery was then divided using the  LigaSure. The specimen was removed from the operative field and sent to pathology further examination. A side to side sigmoid colon to colorectal: Anastomosis was performed using a GIA-75 stapler. The colotomy was closed using a TA 60 stapler. The staple line was bolstered using 3-0 silk sutures. Surrounding adipose tissue was also placed over the anastomosis. A widely patent anastomosis was found. Minimal spillage of bile contents was noted. The pelvis was then copiously irrigated normal saline. All operating personnel then changed gowns and gloves. A new operative setup was then used.  The fascia was reapproximated using a looped 0 PDS running suture. Subcutaneous layer was irrigated normal saline. Exparel was instilled into the surrounding wound. The skin was closed using staples. The trocar sites were also closed using staples. Betadine ointment and dry sterile dressings were applied.  All tape and needle counts were correct at the end of the procedure. Patient was extubated in the operating room and transferred to PACU in stable condition.  Complications:  None  EBL:  50 mL  Specimen:  Sigmoid colon

## 2015-04-08 NOTE — Anesthesia Procedure Notes (Signed)
Procedure Name: Intubation Date/Time: 04/08/2015 9:01 AM Performed by: Charmaine Downs Pre-anesthesia Checklist: Patient identified, Emergency Drugs available, Suction available and Patient being monitored Patient Re-evaluated:Patient Re-evaluated prior to inductionOxygen Delivery Method: Circle system utilized Preoxygenation: Pre-oxygenation with 100% oxygen Intubation Type: IV induction, Rapid sequence and Cricoid Pressure applied Ventilation: Mask ventilation without difficulty Laryngoscope Size: Mac and 3 Grade View: Grade III Tube type: Oral Tube size: 7.0 mm Number of attempts: 1 Airway Equipment and Method: Stylet and Oral airway Placement Confirmation: positive ETCO2 and breath sounds checked- equal and bilateral Secured at: 22 cm Tube secured with: Tape Dental Injury: Teeth and Oropharynx as per pre-operative assessment  Difficulty Due To: Difficulty was anticipated, Difficult Airway- due to limited oral opening and Difficult Airway- due to anterior larynx Future Recommendations: Recommend- induction with short-acting agent, and alternative techniques readily available

## 2015-04-08 NOTE — Transfer of Care (Signed)
Immediate Anesthesia Transfer of Care Note  Patient: [name]  Procedure(s) Performed: Procedure(s) with comments: PARTIAL COLECTOMY (N/A) - coverted to open 0947  Patient Location: PACU  Anesthesia Type:General  Level of Consciousness: awake and patient cooperative  Airway & Oxygen Therapy: Patient Spontanous Breathing and Patient connected to face mask oxygen  Post-op Assessment: Report given to RN, Post -op Vital signs reviewed and stable and Patient moving all extremities  Post vital signs: Reviewed and stable  Last Vitals:  Filed Vitals:   04/08/15 0840 04/08/15 0845  BP: 122/71 135/69  Pulse:    Temp:    Resp: 25 14    Complications: No apparent anesthesia complications

## 2015-04-08 NOTE — Progress Notes (Signed)
Arrived to room via bed from PACU S/P Partial Colectomy with 2 puncture sites covered with gauze and midline honeycomb dressing covering staples, no drainage.  Foley  Catheter intact, patient complaining of discomfort, sterile water removed from ballon and catheter advanced and sterile water reinserted.  Patient more comfortable.  Will continue to monitor.

## 2015-04-08 NOTE — Interval H&P Note (Signed)
History and Physical Interval Note:  04/08/2015 8:24 AM  [name]  has presented today for surgery, with the diagnosis of diverticulitis  The various methods of treatment have been discussed with the patient and family. After consideration of risks, benefits and other options for treatment, the patient has consented to  Procedure(s): LAPAROSCOPIC HAND ASSISTED PARTIAL COLECTOMY (N/A) as a surgical intervention .  The patient's history has been reviewed, patient examined, no change in status, stable for surgery.  I have reviewed the patient's chart and labs.  Questions were answered to the patient's satisfaction.     Aviva Signs A

## 2015-04-08 NOTE — Anesthesia Preprocedure Evaluation (Signed)
Anesthesia Evaluation  Patient identified by MRN, date of birth, ID band Patient awake    Reviewed: Allergy & Precautions, NPO status , Patient's Chart, lab work & pertinent test results  History of Anesthesia Complications (+) PONV and history of anesthetic complications  Airway Mallampati: III  TM Distance: >3 FB Neck ROM: Full    Dental  (+) Teeth Intact, Dental Advisory Given   Pulmonary Current Smoker,    breath sounds clear to auscultation       Cardiovascular negative cardio ROS   Rhythm:Regular Rate:Normal     Neuro/Psych  Neuromuscular disease    GI/Hepatic negative GI ROS,   Endo/Other    Renal/GU      Musculoskeletal   Abdominal   Peds  Hematology   Anesthesia Other Findings   Reproductive/Obstetrics                             Anesthesia Physical Anesthesia Plan  ASA: I  Anesthesia Plan: General   Post-op Pain Management:    Induction: Intravenous  Airway Management Planned: Oral ETT  Additional Equipment:   Intra-op Plan:   Post-operative Plan: Extubation in OR  Informed Consent: I have reviewed the patients History and Physical, chart, labs and discussed the procedure including the risks, benefits and alternatives for the proposed anesthesia with the patient or authorized representative who has indicated his/her understanding and acceptance.     Plan Discussed with:   Anesthesia Plan Comments:         Anesthesia Quick Evaluation

## 2015-04-08 NOTE — Anesthesia Postprocedure Evaluation (Signed)
Anesthesia Post Note  Patient: ALIYIAH VANDERLINDEN  Procedure(s) Performed: Procedure(s) (LRB): PARTIAL COLECTOMY (N/A)  Patient location during evaluation: PACU Anesthesia Type: General Level of consciousness: awake and patient cooperative Pain management: pain level controlled Vital Signs Assessment: post-procedure vital signs reviewed and stable Respiratory status: spontaneous breathing and patient connected to face mask oxygen Cardiovascular status: stable Postop Assessment: no signs of nausea or vomiting Anesthetic complications: no    Last Vitals:  Filed Vitals:   04/08/15 0840 04/08/15 0845  BP: 122/71 135/69  Pulse:    Temp:    Resp: 25 14    Last Pain: There were no vitals filed for this visit.               Mavryk Pino J

## 2015-04-09 ENCOUNTER — Encounter (HOSPITAL_COMMUNITY): Payer: Self-pay | Admitting: General Surgery

## 2015-04-09 LAB — CBC
HCT: 40.4 % (ref 36.0–46.0)
Hemoglobin: 13.3 g/dL (ref 12.0–15.0)
MCH: 28.5 pg (ref 26.0–34.0)
MCHC: 32.9 g/dL (ref 30.0–36.0)
MCV: 86.5 fL (ref 78.0–100.0)
PLATELETS: 357 10*3/uL (ref 150–400)
RBC: 4.67 MIL/uL (ref 3.87–5.11)
RDW: 14.4 % (ref 11.5–15.5)
WBC: 17 10*3/uL — ABNORMAL HIGH (ref 4.0–10.5)

## 2015-04-09 LAB — BASIC METABOLIC PANEL
ANION GAP: 7 (ref 5–15)
BUN: 8 mg/dL (ref 6–20)
CALCIUM: 8.8 mg/dL — AB (ref 8.9–10.3)
CO2: 26 mmol/L (ref 22–32)
Chloride: 105 mmol/L (ref 101–111)
Creatinine, Ser: 0.57 mg/dL (ref 0.44–1.00)
GFR calc Af Amer: >60 mL/min (ref >60)
GLUCOSE: 163 mg/dL — AB (ref 65–99)
POTASSIUM: 4.2 mmol/L (ref 3.5–5.1)
Sodium: 138 mmol/L (ref 135–145)

## 2015-04-09 LAB — PHOSPHORUS: Phosphorus: 4.5 mg/dL (ref 2.5–4.6)

## 2015-04-09 LAB — MAGNESIUM: Magnesium: 1.9 mg/dL (ref 1.7–2.4)

## 2015-04-09 MED ORDER — FENTANYL CITRATE (PF) 100 MCG/2ML IJ SOLN
50.0000 ug | INTRAMUSCULAR | Status: DC | PRN
  Administered 2015-04-09 – 2015-04-10 (×5): 50 ug via INTRAVENOUS
  Filled 2015-04-09 (×7): qty 2

## 2015-04-09 NOTE — Care Management Note (Signed)
Case Management Note  Patient Details  Name: [name] MRN: [id number] Date of Birth: 01-16-1971  Subjective/Objective:                  Pt admitted from home with s/p colectomy. Pt lives with family and will return home at discharge. Pt is independent with ADL's.  Action/Plan: No CM needs noted.  Expected Discharge Date:  04/11/15               Expected Discharge Plan:  Home/Self Care  In-House Referral:  NA  Discharge planning Services  CM Consult  Post Acute Care Choice:  NA Choice offered to:  NA  DME Arranged:    DME Agency:     HH Arranged:    HH Agency:     Status of Service:  Completed, signed off  Medicare Important Message Given:    Date Medicare IM Given:    Medicare IM give by:    Date Additional Medicare IM Given:    Additional Medicare Important Message give by:     If discussed at [place] of Stay Meetings, dates discussed:    Additional Comments:  Joylene Draft, RN 04/09/2015, 11:27 AM

## 2015-04-09 NOTE — Progress Notes (Signed)
1 Day Post-Op  Subjective: Having incisional pain. Is needing dilated every 2 hours which makes her feel loopy.  Objective: Vital signs in last 24 hours: Temp:  [98.2 F (36.8 C)-99 F (37.2 C)] 98.2 F (36.8 C) (12/15 0603) Pulse Rate:  [60-78] 60 (12/15 0603) Resp:  [20-24] 20 (12/15 0603) BP: (125-152)/(66-93) 125/76 mmHg (12/15 0603) SpO2:  [96 %-100 %] 96 % (12/15 0603)    Intake/Output from previous day: 12/14 0701 - 12/15 0700 In: 2340 [P.O.:240; I.V.:2100] Out: 2650 [Urine:2600; Blood:50] Intake/Output this shift:    General appearance: alert, cooperative and no distress Resp: clear to auscultation bilaterally Cardio: regular rate and rhythm, S1, S2 normal, no murmur, click, rub or gallop GI: Soft, dressings dry and intact.  Lab Results:   Recent Labs  04/09/15 0451  WBC 17.0*  HGB 13.3  HCT 40.4  PLT 357   BMET  Recent Labs  04/09/15 0451  NA 138  K 4.2  CL 105  CO2 26  GLUCOSE 163*  BUN 8  CREATININE 0.57  CALCIUM 8.8*   PT/INR No results for input(s): LABPROT, INR in the last 72 hours.  Studies/Results: No results found.  Anti-infectives: Anti-infectives    Start     Dose/Rate Route Frequency Ordered Stop   04/08/15 2100  cefoTEtan (CEFOTAN) 1 g in dextrose 5 % 50 mL IVPB     1 g 100 mL/hr over 30 Minutes Intravenous Every 12 hours 04/08/15 1332     04/08/15 0758  cefoTEtan in Dextrose 5% (CEFOTAN) IVPB 2 g     2 g Intravenous On call to O.R. 04/08/15 0758 04/08/15 0845      Assessment/Plan: s/p Procedure(s): PARTIAL COLECTOMY Impression: Stable on postoperative day 1. Hemoglobin stable. Will switch pain medication to fentanyl. Will get patient up in chair.  LOS: 1 day    Onica Davidovich A 04/09/2015

## 2015-04-10 ENCOUNTER — Encounter (HOSPITAL_COMMUNITY): Payer: Self-pay | Admitting: General Surgery

## 2015-04-10 LAB — BASIC METABOLIC PANEL
Anion gap: 6 (ref 5–15)
BUN: 10 mg/dL (ref 6–20)
CALCIUM: 8.4 mg/dL — AB (ref 8.9–10.3)
CO2: 27 mmol/L (ref 22–32)
CREATININE: 0.74 mg/dL (ref 0.44–1.00)
Chloride: 105 mmol/L (ref 101–111)
GFR calc Af Amer: >60 mL/min (ref >60)
Glucose, Bld: 128 mg/dL — ABNORMAL HIGH (ref 65–99)
Potassium: 3.7 mmol/L (ref 3.5–5.1)
SODIUM: 138 mmol/L (ref 135–145)

## 2015-04-10 LAB — CBC
HCT: 38.1 % (ref 36.0–46.0)
Hemoglobin: 12.6 g/dL (ref 12.0–15.0)
MCH: 28.6 pg (ref 26.0–34.0)
MCHC: 33.1 g/dL (ref 30.0–36.0)
MCV: 86.4 fL (ref 78.0–100.0)
PLATELETS: 311 10*3/uL (ref 150–400)
RBC: 4.41 MIL/uL (ref 3.87–5.11)
RDW: 14.7 % (ref 11.5–15.5)
WBC: 11.7 10*3/uL — ABNORMAL HIGH (ref 4.0–10.5)

## 2015-04-10 LAB — PHOSPHORUS: Phosphorus: 4.1 mg/dL (ref 2.5–4.6)

## 2015-04-10 LAB — MAGNESIUM: MAGNESIUM: 1.9 mg/dL (ref 1.7–2.4)

## 2015-04-10 MED ORDER — KCL IN DEXTROSE-NACL 20-5-0.45 MEQ/L-%-% IV SOLN
INTRAVENOUS | Status: DC
  Administered 2015-04-10 – 2015-04-11 (×2): via INTRAVENOUS

## 2015-04-10 NOTE — Progress Notes (Signed)
2 Days Post-Op  Subjective: Less incisional pain than yesterday. Patient feels like she may have a bowel movement today.  Objective: Vital signs in last 24 hours: Temp:  [98.2 F (36.8 C)-98.6 F (37 C)] 98.4 F (36.9 C) (12/16 0612) Pulse Rate:  [53-78] 72 (12/16 0612) Resp:  [17-20] 20 (12/16 0612) BP: (125-143)/(62-78) 143/78 mmHg (12/16 0612) SpO2:  [96 %-100 %] 99 % (12/16 0612) Last BM Date: 04/08/15  Intake/Output from previous day: 12/15 0701 - 12/16 0700 In: 240 [P.O.:240] Out: -  Intake/Output this shift:    General appearance: alert, cooperative and no distress Resp: clear to auscultation bilaterally Cardio: regular rate and rhythm, S1, S2 normal, no murmur, click, rub or gallop GI: Soft. Incision healing well. Occasional bowel sounds appreciated.  Lab Results:   Recent Labs  04/09/15 0451 04/10/15 0629  WBC 17.0* 11.7*  HGB 13.3 12.6  HCT 40.4 38.1  PLT 357 311   BMET  Recent Labs  04/09/15 0451 04/10/15 0629  NA 138 138  K 4.2 3.7  CL 105 105  CO2 26 27  GLUCOSE 163* 128*  BUN 8 10  CREATININE 0.57 0.74  CALCIUM 8.8* 8.4*   PT/INR No results for input(s): LABPROT, INR in the last 72 hours.  Studies/Results: No results found.  Anti-infectives: Anti-infectives    Start     Dose/Rate Route Frequency Ordered Stop   04/08/15 2100  cefoTEtan (CEFOTAN) 1 g in dextrose 5 % 50 mL IVPB     1 g 100 mL/hr over 30 Minutes Intravenous Every 12 hours 04/08/15 1332     04/08/15 0758  cefoTEtan in Dextrose 5% (CEFOTAN) IVPB 2 g     2 g Intravenous On call to O.R. 04/08/15 0758 04/08/15 0845      Assessment/Plan: s/p Procedure(s): PARTIAL COLECTOMY LAPAROSCOPIC HAND ASSISTED PARTIAL COLECTOMY  CONVERTED TO OPEN AT 0947 Impression: Stable on postoperative day 2. Awaiting return of bowel function. Adjust IV fluids. Will advance diet once patient's bowel function returned.  LOS: 2 days    Yazleemar Strassner A 04/10/2015

## 2015-04-11 NOTE — Progress Notes (Signed)
3 Days Post-Op  Subjective: Had bowel movement this morning. Incisional pain better controlled with by mouth pain medication.  Objective: Vital signs in last 24 hours: Temp:  [98.4 F (36.9 C)-98.9 F (37.2 C)] 98.4 F (36.9 C) (12/17 0622) Pulse Rate:  [66-71] 66 (12/17 0622) Resp:  [20] 20 (12/17 0622) BP: (121-135)/(68-86) 129/86 mmHg (12/17 0622) SpO2:  [96 %-98 %] 97 % (12/17 0622) Last BM Date: 04/08/15  Intake/Output from previous day: 12/16 0701 - 12/17 0700 In: 967.5 [I.V.:767.5; IV Piggyback:200] Out: -  Intake/Output this shift:    General appearance: alert, cooperative and no distress Resp: clear to auscultation bilaterally Cardio: regular rate and rhythm, S1, S2 normal, no murmur, click, rub or gallop GI: Soft, incisions healing well. Bowel sounds are active.  Lab Results:   Recent Labs  04/09/15 0451 04/10/15 0629  WBC 17.0* 11.7*  HGB 13.3 12.6  HCT 40.4 38.1  PLT 357 311   BMET  Recent Labs  04/09/15 0451 04/10/15 0629  NA 138 138  K 4.2 3.7  CL 105 105  CO2 26 27  GLUCOSE 163* 128*  BUN 8 10  CREATININE 0.57 0.74  CALCIUM 8.8* 8.4*   PT/INR No results for input(s): LABPROT, INR in the last 72 hours.  Studies/Results: No results found.  Anti-infectives: Anti-infectives    Start     Dose/Rate Route Frequency Ordered Stop   04/08/15 2100  cefoTEtan (CEFOTAN) 1 g in dextrose 5 % 50 mL IVPB     1 g 100 mL/hr over 30 Minutes Intravenous Every 12 hours 04/08/15 1332     04/08/15 0758  cefoTEtan in Dextrose 5% (CEFOTAN) IVPB 2 g     2 g Intravenous On call to O.R. 04/08/15 0758 04/08/15 0845      Assessment/Plan: s/p Procedure(s): PARTIAL COLECTOMY LAPAROSCOPIC HAND ASSISTED PARTIAL COLECTOMY  CONVERTED TO OPEN AT 0947 Impression: Bowel function test are returned. We will advance to regular diet. Anticipate discharge in next 24-48 hours.  LOS: 3 days    Lun Muro A 04/11/2015

## 2015-04-12 LAB — TYPE AND SCREEN
ABO/RH(D): B POS
Antibody Screen: NEGATIVE
UNIT DIVISION: 0
UNIT DIVISION: 0

## 2015-04-12 MED ORDER — OXYCODONE-ACETAMINOPHEN 7.5-325 MG PO TABS: 1.0000 | ORAL_TABLET | ORAL | Status: DC | PRN

## 2015-04-12 NOTE — Discharge Instructions (Signed)
Open Colectomy, Care After °Refer to this sheet in the next few weeks. These instructions provide you with information on caring for yourself after your procedure. Your health care provider may also give you more specific instructions. Your treatment has been planned according to current medical practices, but problems sometimes occur. Call your health care provider if you have any problems or questions after your procedure. °WHAT TO EXPECT AFTER THE PROCEDURE °After your procedure, it is typical to have the following: °· Pain in your abdomen, especially along your incision. You will be given medicines to control the pain. °· Tiredness. This is a normal part of the recovery process. Your energy level will return to normal over the next several weeks. °· Constipation. You may be given a stool softener to prevent this. °HOME CARE INSTRUCTIONS °· Only take over-the-counter or prescription medicines as directed by your health care provider. °· Ask your health care provider whether you may take a shower when you go home. °· You may resume a normal diet and activities as directed. Eat plenty of fruits and vegetables to help prevent constipation. °· Drink enough fluids to keep your urine clear or pale yellow. This also helps prevent constipation. °· Take rest breaks during the day as needed. °· Avoid lifting anything heavier than 25 pounds (11.3 kg) or driving for 4 weeks or until your health care provider says it is okay. °· Follow up with your health care provider as directed. Ask your health care provider when you need to return to have your stitches or staples removed. °SEEK MEDICAL CARE IF: °· You have redness, swelling, or increasing pain in the incision area. °· You see pus coming from the incision area. °· You have a fever. °SEEK IMMEDIATE MEDICAL CARE IF:  °· You have chest pain or shortness of breath. °· You have pain or swelling in your legs. °· You have persistent nausea and vomiting. °· Your wound breaks open  after stitches or staples have been removed. °· You have increasing abdominal pain that is not relieved with medicine. °  °This information is not intended to replace advice given to you by your health care provider. Make sure you discuss any questions you have with your health care provider. °  °Document Released: 11/02/2010 Document Revised: 01/30/2013 Document Reviewed: 11/21/2012 °Elsevier Interactive Patient Education ©2016 [employer] ° °

## 2015-04-12 NOTE — Progress Notes (Signed)
Patient discharged with instructions, prescription, and care notes.  Verbalized understanding via teach back.  IV was removed and the site was WNL. Patient voiced no further complaints or concerns at the time of discharge.  Appointments scheduled per instructions.  Patient left the floor via w/c with staff and family in stable condition..  Dressing was removed by Dr. Arnoldo Morale prior to discharge.  Staples intact without and s/sx of infection AEB no swelling, redness, drainage noted from the site.

## 2015-04-12 NOTE — Progress Notes (Signed)
Pt rested with eyes closed most of night.  Requested pain med once.

## 2015-04-12 NOTE — Discharge Summary (Signed)
Physician Discharge Summary  Patient ID: SUPREET TIM MRN: [id number] DOB/AGE: 09-06-1970 45 y.o.  Admit date: 04/08/2015 Discharge date: 04/12/2015  Admission Diagnoses: Recurrent sigmoid diverticulitis  Discharge Diagnoses: Same Active Problems:   Diverticulitis   Discharged Condition: good  Hospital Course: Patient is a 44 year old white female who has had episodes of recurrent sigmoid diverticulitis who underwent a partial colectomy on 04/08/2015. She tolerated the procedure well. Her postoperative course has been unremarkable. Her diet was advanced without difficulty once her bowel function returned. She is being discharged home on 04/12/2015 in good and improving condition.  Treatments: surgery: Partial colectomy on 04/08/2015  Discharge Exam: Blood pressure 126/69, pulse 72, temperature 98.3 F (36.8 C), temperature source Oral, resp. rate 17, height 5\' 5"  (1.651 m), weight 97.523 kg (215 lb), SpO2 96 %. General appearance: alert, cooperative and no distress Resp: clear to auscultation bilaterally Cardio: regular rate and rhythm, S1, S2 normal, no murmur, click, rub or gallop GI: Soft, incisions healing well.  Disposition: 01-Home or Self Care     Medication List    TAKE these medications        acetaminophen 500 MG tablet  Commonly known as:  TYLENOL  Take 500 mg by mouth every 6 (six) hours as needed for moderate pain.     Melatonin 10 MG Caps  Take 2 capsules by mouth at bedtime as needed (sleep).     nicotine 14 mg/24hr patch  Commonly known as:  NICODERM CQ - dosed in mg/24 hours  Place 1 patch (14 mg total) onto the skin daily.     ondansetron 8 MG tablet  Commonly known as:  ZOFRAN  Take 1 tablet (8 mg total) by mouth every 8 (eight) hours as needed for nausea or vomiting.     oxyCODONE 5 MG immediate release tablet  Commonly known as:  Oxy IR/ROXICODONE  Take 1 tablet (5 mg total) by mouth every 4 (four) hours as needed for severe pain.     oxyCODONE-acetaminophen 7.5-325 MG tablet  Commonly known as:  PERCOCET  Take 1-2 tablets by mouth every 4 (four) hours as needed.           Follow-up Information    Follow up with [name], MD. Schedule an appointment as soon as possible for a visit on 04/16/2015.   Specialty:  General Surgery   Contact information:   [id number] [id number] [phone]       Signed: Aviva Signs A 04/12/2015, 10:04 AM

## 2015-12-21 ENCOUNTER — Emergency Department (HOSPITAL_COMMUNITY)
Admission: EM | Admit: 2015-12-21 | Discharge: 2015-12-21 | Disposition: A | Discharge status: Home / Self Care | Payer: Self-pay | Attending: Emergency Medicine | Admitting: Emergency Medicine

## 2015-12-21 ENCOUNTER — Emergency Department (HOSPITAL_COMMUNITY): Payer: Self-pay

## 2015-12-21 ENCOUNTER — Encounter (HOSPITAL_COMMUNITY): Payer: Self-pay

## 2015-12-21 DIAGNOSIS — R202 Paresthesia of skin: Secondary | ICD-10-CM

## 2015-12-21 DIAGNOSIS — Z79899 Other long term (current) drug therapy: Secondary | ICD-10-CM | POA: Insufficient documentation

## 2015-12-21 DIAGNOSIS — F17210 Nicotine dependence, cigarettes, uncomplicated: Secondary | ICD-10-CM | POA: Insufficient documentation

## 2015-12-21 DIAGNOSIS — E119 Type 2 diabetes mellitus without complications: Secondary | ICD-10-CM | POA: Insufficient documentation

## 2015-12-21 DIAGNOSIS — R209 Unspecified disturbances of skin sensation: Secondary | ICD-10-CM | POA: Insufficient documentation

## 2015-12-21 DIAGNOSIS — Z7984 Long term (current) use of oral hypoglycemic drugs: Secondary | ICD-10-CM | POA: Insufficient documentation

## 2015-12-21 HISTORY — DX: Type 2 diabetes mellitus without complications: E11.9

## 2015-12-21 LAB — COMPREHENSIVE METABOLIC PANEL
ALT: 58 U/L — ABNORMAL HIGH (ref 14–54)
ANION GAP: 7 (ref 5–15)
AST: 31 U/L (ref 15–41)
Albumin: 4.3 g/dL (ref 3.5–5.0)
Alkaline Phosphatase: 84 U/L (ref 38–126)
BILIRUBIN TOTAL: 0.2 mg/dL — AB (ref 0.3–1.2)
BUN: 15 mg/dL (ref 6–20)
CALCIUM: 9.2 mg/dL (ref 8.9–10.3)
CO2: 25 mmol/L (ref 22–32)
Chloride: 107 mmol/L (ref 101–111)
Creatinine, Ser: 0.54 mg/dL (ref 0.44–1.00)
GFR calc Af Amer: >60 mL/min (ref >60)
Glucose, Bld: 128 mg/dL — ABNORMAL HIGH (ref 65–99)
POTASSIUM: 4.1 mmol/L (ref 3.5–5.1)
Sodium: 139 mmol/L (ref 135–145)
TOTAL PROTEIN: 7.7 g/dL (ref 6.5–8.1)

## 2015-12-21 LAB — CBC WITH DIFFERENTIAL/PLATELET
BASOS PCT: 1 %
Basophils Absolute: 0.1 10*3/uL (ref 0.0–0.1)
EOS ABS: 0.3 10*3/uL (ref 0.0–0.7)
Eosinophils Relative: 2 %
HEMATOCRIT: 45.7 % (ref 36.0–46.0)
Hemoglobin: 15.4 g/dL — ABNORMAL HIGH (ref 12.0–15.0)
LYMPHS ABS: 3.6 10*3/uL (ref 0.7–4.0)
Lymphocytes Relative: 31 %
MCH: 30.1 pg (ref 26.0–34.0)
MCHC: 33.7 g/dL (ref 30.0–36.0)
MCV: 89.3 fL (ref 78.0–100.0)
MONO ABS: 0.5 10*3/uL (ref 0.1–1.0)
MONOS PCT: 5 %
Neutro Abs: 7.2 10*3/uL (ref 1.7–7.7)
Neutrophils Relative %: 61 %
Platelets: 324 10*3/uL (ref 150–400)
RBC: 5.12 MIL/uL — ABNORMAL HIGH (ref 3.87–5.11)
RDW: 13.8 % (ref 11.5–15.5)
WBC: 11.6 10*3/uL — ABNORMAL HIGH (ref 4.0–10.5)

## 2015-12-21 LAB — I-STAT CHEM 8, ED
BUN: 15 mg/dL (ref 6–20)
CALCIUM ION: 1.2 mmol/L (ref 1.13–1.30)
CREATININE: 0.6 mg/dL (ref 0.44–1.00)
Chloride: 104 mmol/L (ref 101–111)
Glucose, Bld: 128 mg/dL — ABNORMAL HIGH (ref 65–99)
HEMATOCRIT: 48 % — AB (ref 36.0–46.0)
HEMOGLOBIN: 16.3 g/dL — AB (ref 12.0–15.0)
Potassium: 4 mmol/L (ref 3.5–5.1)
SODIUM: 141 mmol/L (ref 135–145)
TCO2: 24 mmol/L (ref 0–100)

## 2015-12-21 LAB — I-STAT TROPONIN, ED: TROPONIN I, POC: 0 ng/mL (ref 0.00–0.08)

## 2015-12-21 LAB — CBG MONITORING, ED: Glucose-Capillary: 173 mg/dL — ABNORMAL HIGH (ref 65–99)

## 2015-12-21 NOTE — Discharge Instructions (Signed)
Start taking 1 baby aspirin a day and follow-up with your family doctor this week for recheck

## 2015-12-21 NOTE — ED Triage Notes (Signed)
Complain of numbness to left cheek and right hand that started about 30 minutes ago.

## 2015-12-21 NOTE — ED Provider Notes (Signed)
[facility] DEPT Provider Note   CSN: [id number] Arrival date & time: 12/21/15  [id number]  By signing my name below, I, Soijett Blue, attest that this documentation has been prepared under the direction and in the presence of Milton Ferguson, MD. Electronically Signed: Soijett Blue, ED Scribe. 12/21/15. 8:12 AM.   History   Chief Complaint Chief Complaint  Patient presents with  . Numbness    HPI [name] is a 45 y.o. female with a medical hx of carpal tunnel syndrome and DM who presents to the Emergency Department complaining of left sided facial numbness onset 30 minutes ago PTA. Pt notes that she was on her way to work when she had left sided facial numbness, palpitations, and clammy hands. Pt is unsure of how long her symptoms lasted but she notes that her symptoms have resolved at this time. Pt is having associated symptoms of resolved SOB, resolved clammy hands, resolved palpitations, and tingling to right hand. She notes that she has not tried any medications for the relief of her symptoms. She denies any other symptoms. Pt reports that she takes metformin for her diabetes.     Patient complains of some numbness to her face   The history is provided by the patient. No language interpreter was used.  Weakness  This is a new problem. The current episode started less than 1 hour ago. The problem occurs constantly. The problem has been gradually improving. Associated symptoms include shortness of breath (resolved). Pertinent negatives include no chest pain, no abdominal pain and no headaches. Nothing aggravates the symptoms. Nothing relieves the symptoms. She has tried nothing for the symptoms. The treatment provided no relief.    Past Medical History:  Diagnosis Date  . Carpal tunnel syndrome, bilateral   . Diabetes mellitus without complication ([place])   . Diverticulitis   . PONV (postoperative nausea and vomiting)   . Sciatica     Patient Active Problem List   Diagnosis  Date Noted  . Abdominal pain 02/17/2015  . Nausea without vomiting 02/17/2015  . Diverticulitis of intestine with abscess 01/08/2015  . Acute diverticulitis 01/08/2015  . Nausea 12/31/2014  . Diverticulitis 12/31/2014  . Hyperglycemia 12/31/2014  . Medication reaction 12/31/2014  . Diverticulitis of large intestine without perforation or abscess without bleeding   . Thrush, oral   . Fatty liver 03/11/2014  . Family hx of colon cancer requiring screening colonoscopy 03/11/2014  . Rt flank pain 03/11/2014  . Ulnar nerve compression 06/14/2011  . 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 LOW BACK PAIN 11/07/2007  . SCIATICA 11/07/2007  . LUMBAR SPASM 11/07/2007    Past Surgical History:  Procedure Laterality Date  . ablasion of uterus    . CARPAL TUNNEL RELEASE  11/10/2010   Procedure: CARPAL TUNNEL RELEASE;  Surgeon: Arther Abbott, MD;  Location: [facility];  Service: Orthopedics;  Laterality: Right;  . CHOLECYSTECTOMY     [facility]  . COLON RESECTION N/A 04/08/2015   Procedure: LAPAROSCOPIC HAND ASSISTED PARTIAL COLECTOMY  CONVERTED TO OPEN AT [id number];  Surgeon: Aviva Signs, MD;  Location: [facility];  Service: General;  Laterality: N/A;  . COLONOSCOPY N/A 03/28/2014   Procedure: COLONOSCOPY;  Surgeon: Rogene Houston, MD;  Location: [facility];  Service: Endoscopy;  Laterality: N/A;  730  . KNEE SURGERY    . PARTIAL COLECTOMY N/A 04/08/2015   Procedure: PARTIAL COLECTOMY;  Surgeon: Aviva Signs, MD;  Location: [facility];  Service: General;  Laterality: N/A;  . right knee arthroscopy    .  TUBAL LIGATION      OB History    No data available       Home Medications    Prior to Admission medications   Medication Sig Start Date End Date Taking? Authorizing Provider  acetaminophen (TYLENOL) 500 MG tablet Take 500 mg by mouth every 6 (six) hours as needed for moderate pain.   Yes Historical Provider, MD  COD LIVER OIL PO Take 1 capsule by mouth daily.   Yes Historical Provider, MD    lovastatin (MEVACOR) 10 MG tablet Take 10 mg by mouth at bedtime.   Yes Historical Provider, MD  metFORMIN (GLUCOPHAGE) 500 MG tablet Take 500 mg by mouth 2 (two) times daily with a meal.   Yes Historical Provider, MD  nicotine (NICODERM CQ - DOSED IN MG/24 HOURS) 14 mg/24hr patch Place 1 patch (14 mg total) onto the skin daily. Patient not taking: Reported on 12/21/2015 02/20/15   Samuella Cota, MD    Family History Family History  Problem Relation Age of Onset  . Diabetes type I Mother   . Hyperlipidemia Mother   . Diabetes type II Father   . Hypertension Father   . Hyperlipidemia Father   . Diabetes type II Brother   . Diabetes type II Brother   . Cancer    . Diabetes    . Arthritis    . Asthma    . Anesthesia problems Neg Hx     Social History Social History  Substance Use Topics  . Smoking status: Current Every Day Smoker    Packs/day: 0.50    Years: 26.00    Types: Cigarettes  . Smokeless tobacco: Never Used  . Alcohol use No     Allergies   Flagyl [metronidazole] and Hydrocodone   Review of Systems Review of Systems  Constitutional: Negative for appetite change and fatigue.  HENT: Negative for congestion, ear discharge and sinus pressure.   Eyes: Negative for discharge.  Respiratory: Positive for shortness of breath (resolved). Negative for cough.   Cardiovascular: Positive for palpitations (resolved). Negative for chest pain.  Gastrointestinal: Negative for abdominal pain and diarrhea.  Genitourinary: Negative for frequency and hematuria.  Musculoskeletal: Negative for back pain.  Skin: Negative for rash.  Allergic/Immunologic: Negative for immunocompromised state.  Neurological: Positive for weakness and numbness (left sided face). Negative for seizures and headaches.       Tingling to right hand  Psychiatric/Behavioral: Negative for hallucinations.     Physical Exam Updated Vital Signs BP 139/93 (BP Location: Right Arm)   Pulse 67   Temp  97.9 F (36.6 C) (Oral)   Resp 12   Ht 5\' 5"  (1.651 m)   Wt 210 lb (95.3 kg)   SpO2 100%   BMI 34.95 kg/m   Physical Exam  Constitutional: She is oriented to person, place, and time. She appears well-developed.  HENT:  Head: Normocephalic.  Eyes: Conjunctivae and EOM are normal. No scleral icterus.  Neck: Neck supple. No thyromegaly present.  Cardiovascular: Normal rate and regular rhythm.  Exam reveals no gallop and no friction rub.   No murmur heard. Pulmonary/Chest: No stridor. She has no wheezes. She has no rales. She exhibits no tenderness.  Abdominal: She exhibits no distension. There is no tenderness. There is no rebound.  Musculoskeletal: Normal range of motion. She exhibits no edema.  Lymphadenopathy:    She has no cervical adenopathy.  Neurological: She is oriented to person, place, and time. She exhibits normal muscle tone. Coordination normal.  Skin: No rash noted. No erythema.  Psychiatric: She has a normal mood and affect. Her behavior is normal.     ED Treatments / Results  DIAGNOSTIC STUDIES: Oxygen Saturation is 98% on RA, nl by my interpretation.    COORDINATION OF CARE: 8:10 AM Discussed treatment plan with pt at bedside which includes labs, CXR, MR brain, EKG and pt agreed to plan.   Labs (all labs ordered are listed, but only abnormal results are displayed) Labs Reviewed  CBC WITH DIFFERENTIAL/PLATELET - Abnormal; Notable for the following:       Result Value   WBC 11.6 (*)    RBC 5.12 (*)    Hemoglobin 15.4 (*)    All other components within normal limits  COMPREHENSIVE METABOLIC PANEL - Abnormal; Notable for the following:    Glucose, Bld 128 (*)    ALT 58 (*)    Total Bilirubin 0.2 (*)    All other components within normal limits  CBG MONITORING, ED - Abnormal; Notable for the following:    Glucose-Capillary 173 (*)    All other components within normal limits  I-STAT CHEM 8, ED - Abnormal; Notable for the following:    Glucose, Bld 128  (*)    Hemoglobin 16.3 (*)    HCT 48.0 (*)    All other components within normal limits  I-STAT TROPOININ, ED    EKG  EKG Interpretation None       Radiology Dg Chest 2 View  Result Date: 12/21/2015 CLINICAL DATA:  Left facial numbness, bilateral arm numbness, palpitations, and shortness of breath. History of diabetes, current smoker. EXAM: CHEST  2 VIEW COMPARISON:  Chest x-ray of December 31, 2014. FINDINGS: The lungs are adequately inflated. The interstitial markings are mildly increased but stable. The heart and pulmonary vascularity are normal. The mediastinum is normal in width. The trachea is midline. The bony thorax exhibits no acute abnormality. IMPRESSION: There is no active cardiopulmonary disease. Mild stable interstitial prominence of both lungs. Electronically Signed   By: David  Martinique M.D.   On: 12/21/2015 08:50   Mr Brain Wo Contrast  Result Date: 12/21/2015 CLINICAL DATA:  LEFT-sided facial numbness, which began earlier this morning. This is associated with palpitations. History of diabetes. EXAM: MRI HEAD WITHOUT CONTRAST TECHNIQUE: Multiplanar, multiecho pulse sequences of the brain and surrounding structures were obtained without intravenous contrast. COMPARISON:  None. FINDINGS: No evidence for acute infarction, hemorrhage, mass lesion, hydrocephalus, or extra-axial fluid. Normal cerebral volume. No white matter disease. Pituitary, pineal, and cerebellar tonsils unremarkable. No upper cervical lesions. Flow voids are maintained throughout the carotid, basilar, and vertebral arteries. There are no areas of chronic hemorrhage. No acute sinus fluid. Fluid-filled concha bullosa, LEFT middle turbinate. Trace mastoid effusion, LEFT. Negative orbits. Visualized extracranial soft tissues unremarkable. IMPRESSION: Negative exam.  No cause seen for the reported symptoms. Electronically Signed   By: Staci Righter M.D.   On: 12/21/2015 09:34    Procedures Procedures (including  critical care time)  Medications Ordered in ED Medications - No data to display   Initial Impression / Assessment and Plan / ED Course  I have reviewed the triage vital signs and the nursing notes.  Pertinent labs & imaging results that were available during my care of the patient were reviewed by me and considered in my medical decision making (see chart for details).  Clinical Course    Numbness to face has improved. Labs unremarkable MRI of the head normal. Suspect paresthesias. Will place patient  on a baby aspirin a day and follow-up with her PCP  Final Clinical Impressions(s) / ED Diagnoses   Final diagnoses:  None    New Prescriptions New Prescriptions   No medications on file   The chart was scribed for me under my direct supervision.  I personally performed the history, physical, and medical decision making and all procedures in the evaluation of this patient.Milton Ferguson, MD 12/21/15 1145

## 2015-12-21 NOTE — ED Notes (Signed)
CBG - 173 

## 2016-06-05 ENCOUNTER — Emergency Department (HOSPITAL_COMMUNITY)
Admission: EM | Admit: 2016-06-05 | Discharge: 2016-06-05 | Disposition: A | Discharge status: Home / Self Care | Payer: PRIVATE HEALTH INSURANCE | Attending: Emergency Medicine | Admitting: Emergency Medicine

## 2016-06-05 ENCOUNTER — Encounter (HOSPITAL_COMMUNITY): Payer: Self-pay | Admitting: Emergency Medicine

## 2016-06-05 DIAGNOSIS — F17210 Nicotine dependence, cigarettes, uncomplicated: Secondary | ICD-10-CM | POA: Diagnosis not present

## 2016-06-05 DIAGNOSIS — B349 Viral infection, unspecified: Secondary | ICD-10-CM | POA: Diagnosis not present

## 2016-06-05 DIAGNOSIS — E119 Type 2 diabetes mellitus without complications: Secondary | ICD-10-CM | POA: Insufficient documentation

## 2016-06-05 DIAGNOSIS — Z79899 Other long term (current) drug therapy: Secondary | ICD-10-CM | POA: Insufficient documentation

## 2016-06-05 DIAGNOSIS — Z7984 Long term (current) use of oral hypoglycemic drugs: Secondary | ICD-10-CM | POA: Insufficient documentation

## 2016-06-05 DIAGNOSIS — R51 Headache: Secondary | ICD-10-CM | POA: Diagnosis present

## 2016-06-05 LAB — BASIC METABOLIC PANEL
Anion gap: 10 (ref 5–15)
BUN: 18 mg/dL (ref 6–20)
CALCIUM: 9.3 mg/dL (ref 8.9–10.3)
CHLORIDE: 103 mmol/L (ref 101–111)
CO2: 21 mmol/L — AB (ref 22–32)
CREATININE: 0.57 mg/dL (ref 0.44–1.00)
GFR calc Af Amer: >60 mL/min (ref >60)
GFR calc non Af Amer: >60 mL/min (ref >60)
GLUCOSE: 186 mg/dL — AB (ref 65–99)
Potassium: 3.4 mmol/L — ABNORMAL LOW (ref 3.5–5.1)
Sodium: 134 mmol/L — ABNORMAL LOW (ref 135–145)

## 2016-06-05 LAB — CBC WITH DIFFERENTIAL/PLATELET
BASOS PCT: 1 %
Basophils Absolute: 0.1 10*3/uL (ref 0.0–0.1)
Eosinophils Absolute: 0.3 10*3/uL (ref 0.0–0.7)
Eosinophils Relative: 2 %
HEMATOCRIT: 42.9 % (ref 36.0–46.0)
HEMOGLOBIN: 14.7 g/dL (ref 12.0–15.0)
LYMPHS ABS: 5.7 10*3/uL — AB (ref 0.7–4.0)
LYMPHS PCT: 38 %
MCH: 29.6 pg (ref 26.0–34.0)
MCHC: 34.3 g/dL (ref 30.0–36.0)
MCV: 86.5 fL (ref 78.0–100.0)
MONO ABS: 0.7 10*3/uL (ref 0.1–1.0)
MONOS PCT: 4 %
Neutro Abs: 8.4 10*3/uL — ABNORMAL HIGH (ref 1.7–7.7)
Neutrophils Relative %: 55 %
Platelets: 333 10*3/uL (ref 150–400)
RBC: 4.96 MIL/uL (ref 3.87–5.11)
RDW: 13.7 % (ref 11.5–15.5)
WBC: 15.1 10*3/uL — ABNORMAL HIGH (ref 4.0–10.5)

## 2016-06-05 MED ORDER — SODIUM CHLORIDE 0.9 % IV BOLUS (SEPSIS)
1000.0000 mL | Freq: Once | INTRAVENOUS | Status: AC
  Administered 2016-06-05: 1000 mL via INTRAVENOUS

## 2016-06-05 MED ORDER — METOCLOPRAMIDE HCL 5 MG/ML IJ SOLN
10.0000 mg | Freq: Once | INTRAMUSCULAR | Status: AC
  Administered 2016-06-05: 10 mg via INTRAVENOUS
  Filled 2016-06-05: qty 2

## 2016-06-05 MED ORDER — DIPHENHYDRAMINE HCL 50 MG/ML IJ SOLN
25.0000 mg | Freq: Once | INTRAMUSCULAR | Status: AC
  Administered 2016-06-05: 25 mg via INTRAVENOUS
  Filled 2016-06-05: qty 1

## 2016-06-05 MED ORDER — KETOROLAC TROMETHAMINE 30 MG/ML IJ SOLN
30.0000 mg | Freq: Once | INTRAMUSCULAR | Status: AC
  Administered 2016-06-05: 30 mg via INTRAVENOUS
  Filled 2016-06-05: qty 1

## 2016-06-05 NOTE — ED Notes (Signed)
Pt states that she was diagnosed with the flu on Monday, finished her tamiflu and a z-pack that she was prescribed as well, thought that she was getting better yesterday and started having headache, ? Sinus issues today, denies any cough, states " it is from the head up",

## 2016-06-05 NOTE — ED Triage Notes (Signed)
Patient states testing positive for flu on Monday and was given tamiflu. States no better and has headache and that her heart is racing.

## 2016-06-05 NOTE — ED Notes (Signed)
ED Provider at bedside. 

## 2016-06-05 NOTE — ED Notes (Signed)
Pt given [pillow and diet coke per request, update given,

## 2016-06-05 NOTE — ED Provider Notes (Signed)
[facility] DEPT Provider Note   CSN: [id number] Arrival date & time: 06/05/16  1408     History   Chief Complaint Chief Complaint  Patient presents with  . Influenza  . Headache    HPI [name] is a 46 y.o. female.  Patient presented with flulike symptoms approximately 9 days ago. She was seen by her primary care doctor on Monday and given Tamiflu and Zithromax. She was starting to feel better. Yesterday she complained of a headache and sinus congestion. No fever, meningeal signs, neurodeficits.      Past Medical History:  Diagnosis Date  . Carpal tunnel syndrome, bilateral   . Diabetes mellitus without complication ([place])   . Diverticulitis   . PONV (postoperative nausea and vomiting)   . Sciatica     Patient Active Problem List   Diagnosis Date Noted  . Abdominal pain 02/17/2015  . Nausea without vomiting 02/17/2015  . Diverticulitis of intestine with abscess 01/08/2015  . Acute diverticulitis 01/08/2015  . Nausea 12/31/2014  . Diverticulitis 12/31/2014  . Hyperglycemia 12/31/2014  . Medication reaction 12/31/2014  . Diverticulitis of large intestine without perforation or abscess without bleeding   . Thrush, oral   . Fatty liver 03/11/2014  . Family hx of colon cancer requiring screening colonoscopy 03/11/2014  . Rt flank pain 03/11/2014  . Ulnar nerve compression 06/14/2011  . 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 LOW BACK PAIN 11/07/2007  . SCIATICA 11/07/2007  . LUMBAR SPASM 11/07/2007    Past Surgical History:  Procedure Laterality Date  . ablasion of uterus    . CARPAL TUNNEL RELEASE  11/10/2010   Procedure: CARPAL TUNNEL RELEASE;  Surgeon: Arther Abbott, MD;  Location: [facility];  Service: Orthopedics;  Laterality: Right;  . CHOLECYSTECTOMY     [facility]  . COLON RESECTION N/A 04/08/2015   Procedure: LAPAROSCOPIC HAND ASSISTED PARTIAL COLECTOMY  CONVERTED TO OPEN AT [id number];  Surgeon: Aviva Signs, MD;  Location: [facility];  Service:  General;  Laterality: N/A;  . COLONOSCOPY N/A 03/28/2014   Procedure: COLONOSCOPY;  Surgeon: Rogene Houston, MD;  [facility];  Service: Endoscopy;  Laterality: N/A;  730  . KNEE SURGERY    . PARTIAL COLECTOMY N/A 04/08/2015   Procedure: PARTIAL COLECTOMY;  Surgeon: Aviva Signs, MD;  Location: [facility];  Service: General;  Laterality: N/A;  . right knee arthroscopy    . TUBAL LIGATION      OB History    No data available       Home Medications    Prior to Admission medications   Medication Sig Start Date End Date Taking? Authorizing Provider  acetaminophen (TYLENOL) 500 MG tablet Take 500 mg by mouth every 6 (six) hours as needed for moderate pain.   Yes Historical Provider, MD  metFORMIN (GLUCOPHAGE) 500 MG tablet Take 500 mg by mouth 2 (two) times daily with a meal.   Yes Historical Provider, MD  lovastatin (MEVACOR) 10 MG tablet Take 10 mg by mouth at bedtime.    Historical Provider, MD    Family History Family History  Problem Relation Age of Onset  . Diabetes type I Mother   . Hyperlipidemia Mother   . Diabetes type II Father   . Hypertension Father   . Hyperlipidemia Father   . Diabetes type II Brother   . Diabetes type II Brother   . Cancer    . Diabetes    . Arthritis    . Asthma    .  Anesthesia problems Neg Hx     Social History Social History  Substance Use Topics  . Smoking status: Current Every Day Smoker    Packs/day: 0.50    Years: 26.00    Types: Cigarettes  . Smokeless tobacco: Never Used  . Alcohol use No     Allergies   Flagyl [metronidazole] and Hydrocodone   Review of Systems Review of Systems  All other systems reviewed and are negative.    Physical Exam Updated Vital Signs BP 161/89   Pulse 90   Temp 98.8 F (37.1 C) (Oral)   Resp 18   Ht 5\' 5"  (1.651 m)   Wt 220 lb (99.8 kg)   SpO2 100%   BMI 36.61 kg/m   Physical Exam  Constitutional: She is oriented to person, place, and time. She appears well-developed  and well-nourished.  HENT:  Head: Normocephalic and atraumatic.  Eyes: Conjunctivae are normal.  Neck: Neck supple.  No meningeal signs.  Cardiovascular: Normal rate and regular rhythm.   Pulmonary/Chest: Effort normal and breath sounds normal.  Abdominal: Soft. Bowel sounds are normal.  Musculoskeletal: Normal range of motion.  Neurological: She is alert and oriented to person, place, and time.  Skin: Skin is warm and dry.  Psychiatric: She has a normal mood and affect. Her behavior is normal.  Nursing note and vitals reviewed.    ED Treatments / Results  Labs (all labs ordered are listed, but only abnormal results are displayed) Labs Reviewed  CBC WITH DIFFERENTIAL/PLATELET - Abnormal; Notable for the following:       Result Value   WBC 15.1 (*)    Neutro Abs 8.4 (*)    Lymphs Abs 5.7 (*)    All other components within normal limits  BASIC METABOLIC PANEL - Abnormal; Notable for the following:    Sodium 134 (*)    Potassium 3.4 (*)    CO2 21 (*)    Glucose, Bld 186 (*)    All other components within normal limits    EKG  EKG Interpretation None       Radiology No results found.  Procedures Procedures (including critical care time)  Medications Ordered in ED Medications  sodium chloride 0.9 % bolus 1,000 mL (1,000 mLs Intravenous New Bag/Given 06/05/16 2007)  ketorolac (TORADOL) 30 MG/ML injection 30 mg (30 mg Intravenous Given 06/05/16 2007)  diphenhydrAMINE (BENADRYL) injection 25 mg (25 mg Intravenous Given 06/05/16 2007)  metoCLOPramide (REGLAN) injection 10 mg (10 mg Intravenous Given 06/05/16 2007)  sodium chloride 0.9 % bolus 1,000 mL (1,000 mLs Intravenous New Bag/Given 06/05/16 2007)     Initial Impression / Assessment and Plan / ED Course  I have reviewed the triage vital signs and the nursing notes.  Pertinent labs & imaging results that were available during my care of the patient were reviewed by me and considered in my medical decision making  (see chart for details).     Patient is nontoxic-appearing. No meningeal signs. White count slightly elevated. She feels much better after IV fluids, IV Toradol, IV Reglan, IV Benadryl.  Final Clinical Impressions(s) / ED Diagnoses   Final diagnoses:  Viral syndrome    New Prescriptions New Prescriptions   No medications on file     Nat Christen, MD 06/05/16 2148

## 2016-06-05 NOTE — Discharge Instructions (Signed)
Increase fluids. Tylenol or ibuprofen for fever or pain.  Return for stiff neck or any neurological deficits.

## 2016-06-10 ENCOUNTER — Ambulatory Visit (HOSPITAL_COMMUNITY)
Admission: EM | Admit: 2016-06-10 | Discharge: 2016-06-10 | Disposition: A | Discharge status: Home / Self Care | Payer: PRIVATE HEALTH INSURANCE | Attending: Family Medicine | Admitting: Family Medicine

## 2016-06-10 ENCOUNTER — Encounter (HOSPITAL_COMMUNITY): Payer: Self-pay | Admitting: *Deleted

## 2016-06-10 DIAGNOSIS — Z79899 Other long term (current) drug therapy: Secondary | ICD-10-CM | POA: Insufficient documentation

## 2016-06-10 DIAGNOSIS — R509 Fever, unspecified: Secondary | ICD-10-CM | POA: Diagnosis not present

## 2016-06-10 DIAGNOSIS — J0100 Acute maxillary sinusitis, unspecified: Secondary | ICD-10-CM | POA: Diagnosis not present

## 2016-06-10 DIAGNOSIS — Z0001 Encounter for general adult medical examination with abnormal findings: Secondary | ICD-10-CM | POA: Insufficient documentation

## 2016-06-10 DIAGNOSIS — R51 Headache: Secondary | ICD-10-CM | POA: Diagnosis not present

## 2016-06-10 DIAGNOSIS — Z9049 Acquired absence of other specified parts of digestive tract: Secondary | ICD-10-CM | POA: Diagnosis not present

## 2016-06-10 DIAGNOSIS — F17210 Nicotine dependence, cigarettes, uncomplicated: Secondary | ICD-10-CM | POA: Insufficient documentation

## 2016-06-10 DIAGNOSIS — Z9889 Other specified postprocedural states: Secondary | ICD-10-CM | POA: Insufficient documentation

## 2016-06-10 DIAGNOSIS — R0981 Nasal congestion: Secondary | ICD-10-CM | POA: Diagnosis not present

## 2016-06-10 LAB — POCT RAPID STREP A: STREPTOCOCCUS, GROUP A SCREEN (DIRECT): NEGATIVE

## 2016-06-10 MED ORDER — AMOXICILLIN-POT CLAVULANATE 875-125 MG PO TABS: 1.0000 | ORAL_TABLET | Freq: Two times a day (BID) | ORAL | 0 refills | Status: DC

## 2016-06-10 NOTE — ED Provider Notes (Signed)
CSN: [id number]     Arrival date & time 06/10/16  1026 History   None    Chief Complaint  Patient presents with  . Facial Pain   (Consider location/radiation/quality/duration/timing/severity/associated sxs/prior Treatment) 46 year old female presents to clinic with 4 day history of fever, nasal, congestion, left sided facial pain, and left-sided dental pain. She states 2 weeks ago she tested positive for flu A and was treated with Tamiflu states she did feel somewhat better however her symptoms have returned and worsened. She has been taking tylenol for pain relief and for fever with some relief.    The history is provided by the patient.    Past Medical History:  Diagnosis Date  . Carpal tunnel syndrome, bilateral   . Diabetes mellitus without complication ([place])   . Diverticulitis   . PONV (postoperative nausea and vomiting)   . Sciatica    Past Surgical History:  Procedure Laterality Date  . ablasion of uterus    . CARPAL TUNNEL RELEASE  11/10/2010   Procedure: CARPAL TUNNEL RELEASE;  Surgeon: Arther Abbott, MD;  Location: [facility];  Service: Orthopedics;  Laterality: Right;  . CHOLECYSTECTOMY     [facility]  . COLON RESECTION N/A 04/08/2015   Procedure: LAPAROSCOPIC HAND ASSISTED PARTIAL COLECTOMY  CONVERTED TO OPEN AT [id number];  Surgeon: Aviva Signs, MD;  Location: [facility];  Service: General;  Laterality: N/A;  . COLONOSCOPY N/A 03/28/2014   Procedure: COLONOSCOPY;  Surgeon: Rogene Houston, MD;  [facility];  Service: Endoscopy;  Laterality: N/A;  730  . KNEE SURGERY    . PARTIAL COLECTOMY N/A 04/08/2015   Procedure: PARTIAL COLECTOMY;  Surgeon: Aviva Signs, MD;  Location: [facility];  Service: General;  Laterality: N/A;  . right knee arthroscopy    . TUBAL LIGATION     Family History  Problem Relation Age of Onset  . Diabetes type I Mother   . Hyperlipidemia Mother   . Diabetes type II Father   . Hypertension Father   . Hyperlipidemia Father   . Diabetes type II  Brother   . Diabetes type II Brother   . Cancer    . Diabetes    . Arthritis    . Asthma    . Anesthesia problems Neg Hx    Social History  Substance Use Topics  . Smoking status: Current Every Day Smoker    Packs/day: 0.50    Years: 26.00    Types: Cigarettes  . Smokeless tobacco: Never Used  . Alcohol use No   OB History    No data available     Review of Systems  Reason unable to perform ROS: as covered in HPI.  All other systems reviewed and are negative.   Allergies  Flagyl [metronidazole] and Hydrocodone  Home Medications   Prior to Admission medications   Medication Sig Start Date End Date Taking? Authorizing Provider  acetaminophen (TYLENOL) 500 MG tablet Take 500 mg by mouth every 6 (six) hours as needed for moderate pain.    Historical Provider, MD  amoxicillin-clavulanate (AUGMENTIN) 875-125 MG tablet Take 1 tablet by mouth 2 (two) times daily. 06/10/16   Barnet Glasgow, NP  lovastatin (MEVACOR) 10 MG tablet Take 10 mg by mouth at bedtime.    Historical Provider, MD  metFORMIN (GLUCOPHAGE) 500 MG tablet Take 500 mg by mouth 2 (two) times daily with a meal.    Historical Provider, MD   Meds Ordered and Administered this Visit  Medications - No data to  display  BP 130/70 (BP Location: Right Arm)   Pulse 102   Temp 100.1 F (37.8 C) (Oral)   Resp 18  No data found.   Physical Exam  Constitutional: She is oriented to person, place, and time. She appears well-developed and well-nourished. She does not have a sickly appearance. She does not appear ill. No distress.  HENT:  Head: Normocephalic and atraumatic.  Right Ear: Tympanic membrane and external ear normal.  Left Ear: Tympanic membrane and external ear normal.  Nose: Right sinus exhibits no maxillary sinus tenderness and no frontal sinus tenderness. Left sinus exhibits maxillary sinus tenderness. Left sinus exhibits no frontal sinus tenderness.  Mouth/Throat: Uvula is midline, oropharynx is clear and  moist and mucous membranes are normal. No oropharyngeal exudate. Tonsils are 1+ on the right. Tonsils are 1+ on the left. No tonsillar exudate.  Eyes: Pupils are equal, round, and reactive to light.  Neck: Normal range of motion. Neck supple. No JVD present.  Cardiovascular: Normal rate and regular rhythm.   Pulmonary/Chest: Effort normal and breath sounds normal. No respiratory distress. She has no wheezes.  Abdominal: Soft. Bowel sounds are normal. She exhibits no distension. There is no tenderness. There is no guarding.  Lymphadenopathy:       Head (right side): No submental, no submandibular and no tonsillar adenopathy present.       Head (left side): No submental, no submandibular and no tonsillar adenopathy present.    She has no cervical adenopathy.  Neurological: She is alert and oriented to person, place, and time.  Skin: Skin is warm and dry. Capillary refill takes less than 2 seconds. She is not diaphoretic.  Psychiatric: She has a normal mood and affect.  Nursing note and vitals reviewed.   Urgent Care Course     Procedures (including critical care time)  Labs Review Labs Reviewed  POCT RAPID STREP A    Imaging Review No results found.   Visual Acuity Review  Right Eye Distance:   Left Eye Distance:   Bilateral Distance:    Right Eye Near:   Left Eye Near:    Bilateral Near:         MDM   1. Acute maxillary sinusitis, recurrence not specified   I am treating you today for sinusitis. I have prescribed Augmentin, take 1 tablet twice a day for 7 days. For fever and pain I recommend Tylenol every 4 hours, not to exceed 4000 mg a day or Ibuprofen, every 6 hours. You may your flonase nasal spray, two sprays each nostril once a day for congestion, and Mucinex twice a day with a full glass of water. Should your symptoms persist, I would recommend following up with your primary care provider or return to clinic as needed.      Barnet Glasgow, NP 06/10/16  1153

## 2016-06-10 NOTE — Discharge Instructions (Signed)
I am treating you today for sinusitis. I have prescribed Augmentin, take 1 tablet twice a day for 7 days. For fever and pain I recommend Tylenol every 4 hours, not to exceed 4000 mg a day or Ibuprofen, every 6 hours. You may your flonase nasal spray, two sprays each nostril once a day for congestion, and Mucinex twice a day with a full glass of water. Should your symptoms persist, I would recommend following up with your primary care provider or return to clinic as needed.

## 2016-06-10 NOTE — ED Triage Notes (Signed)
Pt  Reports  This  Is  Her  3rd  Visit     For  The  Flu    Pt  States     She     Has   Been  Seen  For   Gastroenteritis     And    And  The  Flu   And  Has  Taken a  Course  Of tamiflu      And  Anti  Biotics

## 2016-06-12 LAB — CULTURE, GROUP A STREP (THRC)

## 2016-08-25 IMAGING — DX DG ABDOMEN ACUTE W/ 1V CHEST
4 series · 4 of 4 positions shown · non-contrast
Comparison: CT 12/26/2014

CLINICAL DATA: Low abdominal pain

EXAM:
DG ABDOMEN ACUTE W/ 1V CHEST

[chest pa]
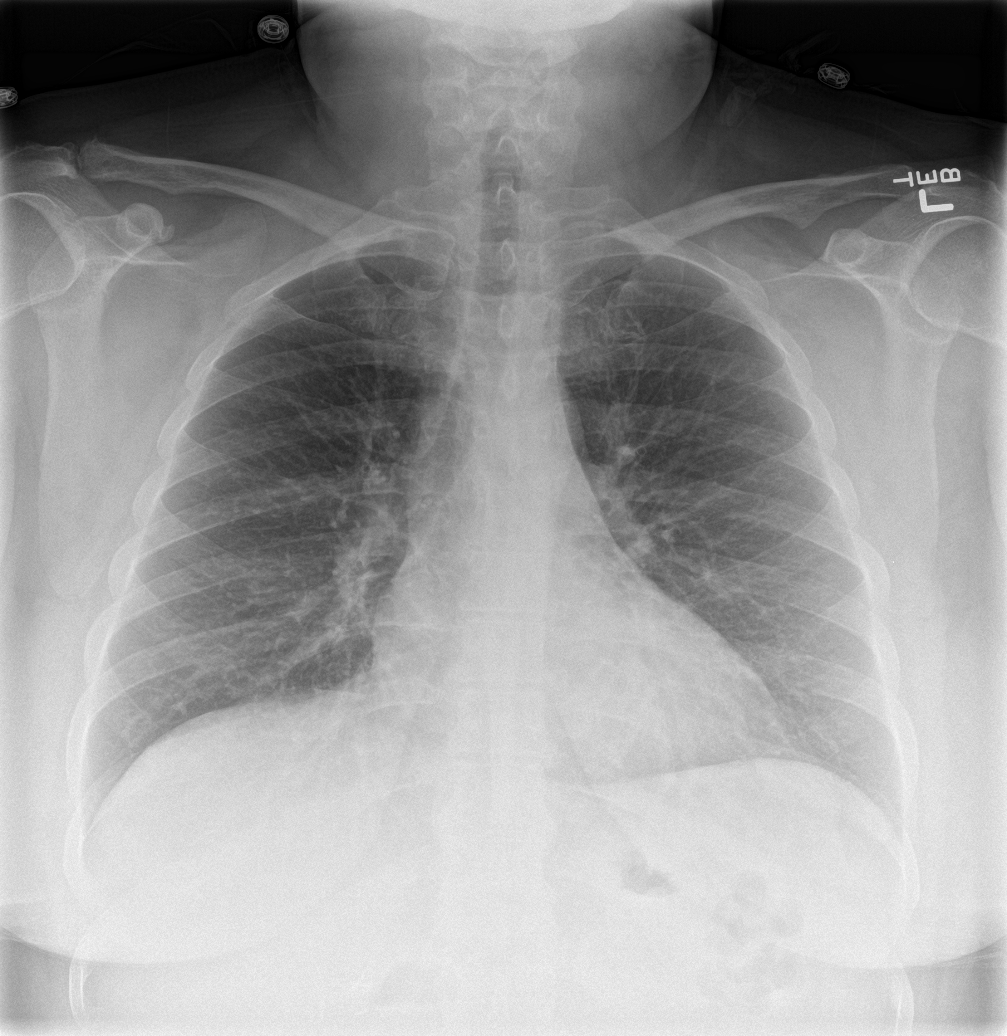

[abdomen erect]
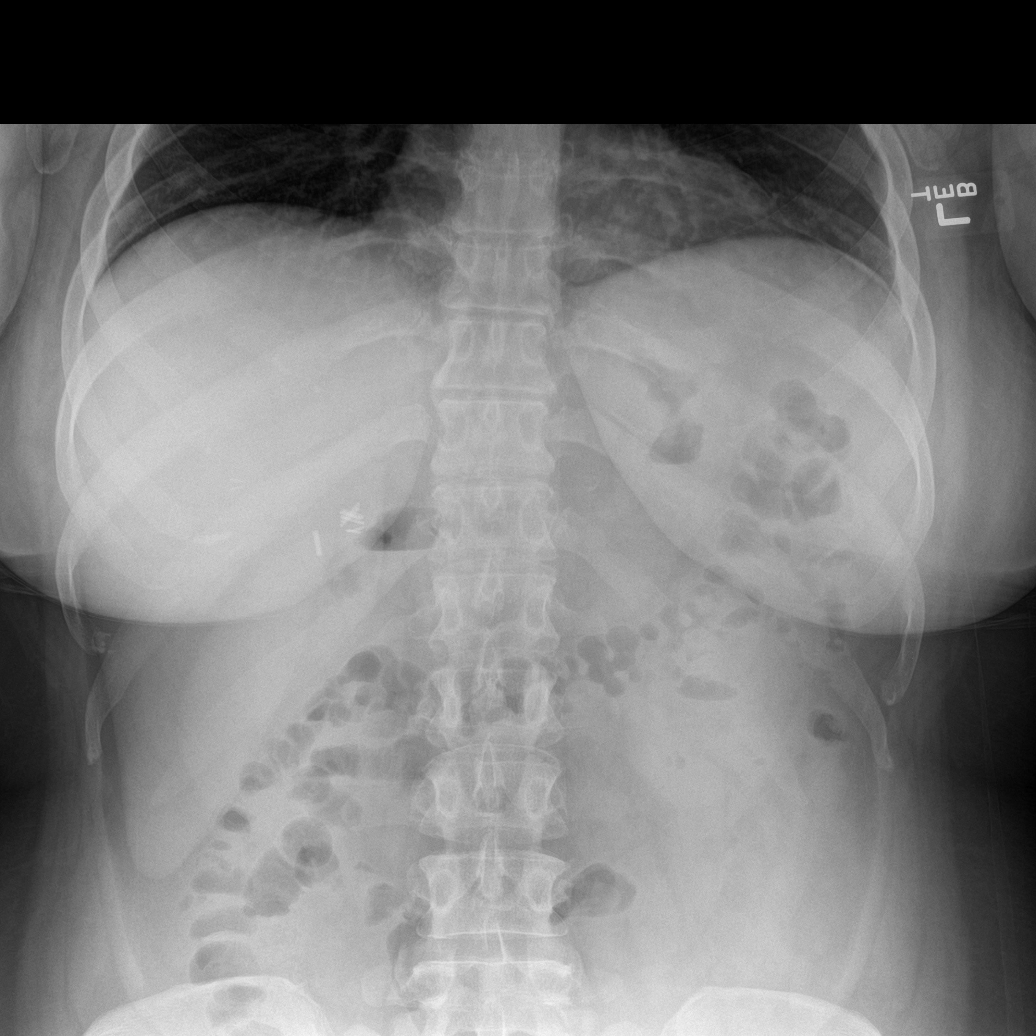

[abdomen supine (1 of 2)]
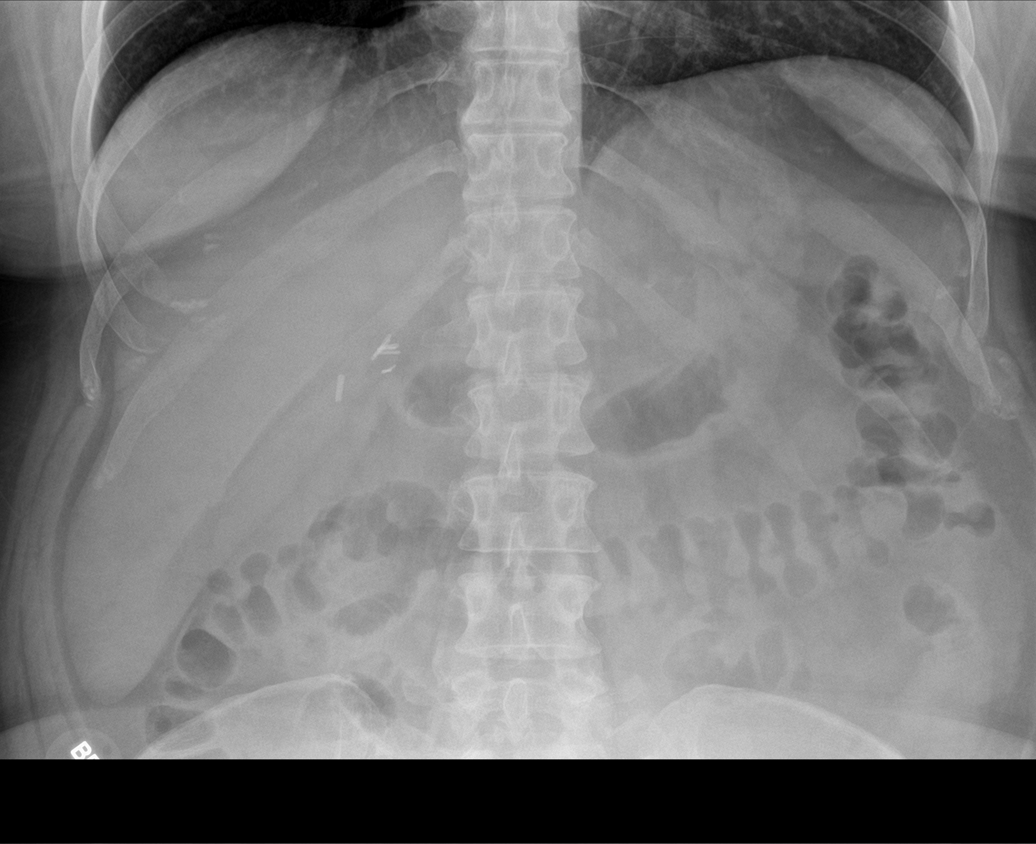

[abdomen supine (2 of 2)]
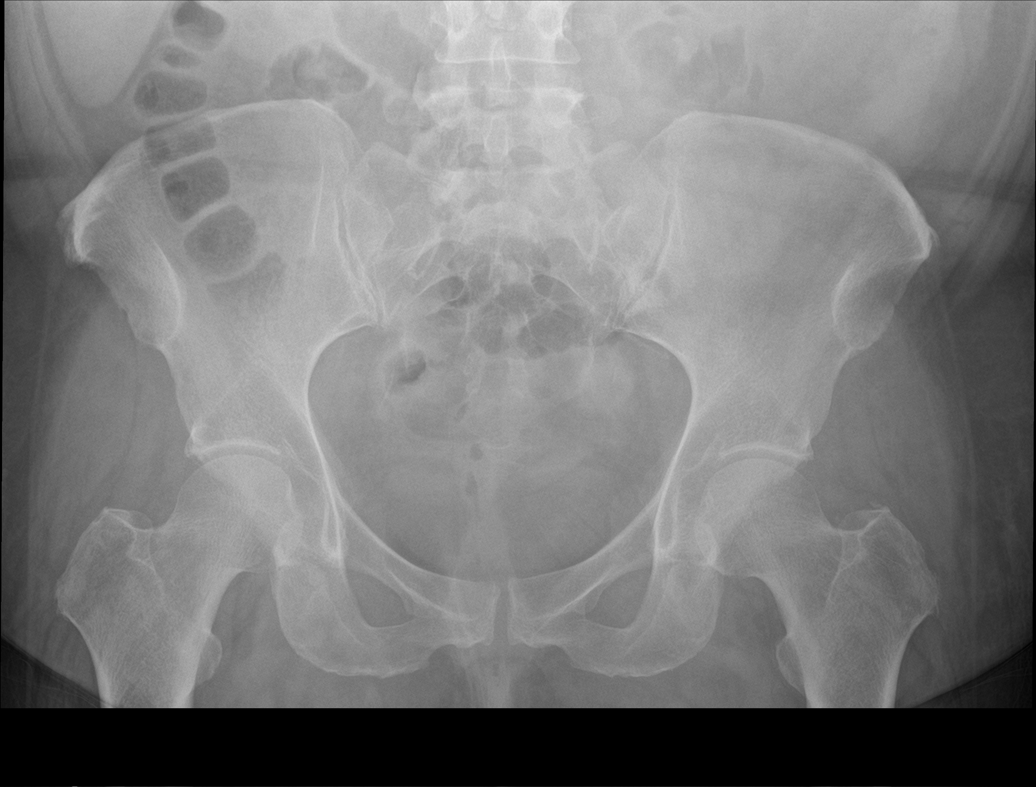

[4 of 4 positions shown; findings below may reference images not displayed]

FINDINGS: There is no evidence of dilated bowel loops or free intraperitoneal
air. No radiopaque calculi or other significant radiographic
abnormality is seen. Heart size and mediastinal contours are within
normal limits. Both lungs are clear but mildly hypo aerated with
crowding of the bronchovascular markings. Cholecystectomy clips are
noted.
IMPRESSION: Negative abdominal radiographs.  No acute cardiopulmonary disease.

## 2016-09-12 ENCOUNTER — Encounter (HOSPITAL_COMMUNITY): Payer: Self-pay | Admitting: Emergency Medicine

## 2016-09-12 ENCOUNTER — Emergency Department (HOSPITAL_COMMUNITY)
Admission: EM | Admit: 2016-09-12 | Discharge: 2016-09-12 | Disposition: A | Discharge status: Home Health | Payer: No Typology Code available for payment source | Attending: Emergency Medicine | Admitting: Emergency Medicine

## 2016-09-12 DIAGNOSIS — E119 Type 2 diabetes mellitus without complications: Secondary | ICD-10-CM | POA: Insufficient documentation

## 2016-09-12 DIAGNOSIS — F17210 Nicotine dependence, cigarettes, uncomplicated: Secondary | ICD-10-CM | POA: Insufficient documentation

## 2016-09-12 DIAGNOSIS — H9202 Otalgia, left ear: Secondary | ICD-10-CM | POA: Insufficient documentation

## 2016-09-12 DIAGNOSIS — K0889 Other specified disorders of teeth and supporting structures: Secondary | ICD-10-CM | POA: Insufficient documentation

## 2016-09-12 MED ORDER — DICLOFENAC SODIUM 75 MG PO TBEC: 75.0000 mg | DELAYED_RELEASE_TABLET | Freq: Two times a day (BID) | ORAL | 0 refills | Status: DC

## 2016-09-12 MED ORDER — CLINDAMYCIN HCL 300 MG PO CAPS: 300.0000 mg | ORAL_CAPSULE | Freq: Four times a day (QID) | ORAL | 0 refills | Status: DC

## 2016-09-12 MED ORDER — OXYCODONE-ACETAMINOPHEN 5-325 MG PO TABS: 1.0000 | ORAL_TABLET | ORAL | 0 refills | Status: DC | PRN

## 2016-09-12 NOTE — ED Provider Notes (Signed)
[facility] DEPT Provider Note   CSN: [id number] Arrival date & time: 09/12/16  6962   By signing my name below, I, Eunice Blase, attest that this documentation has been prepared under the direction and in the presence of Alexander Mcauley, PA-C. Electronically Signed: Eunice Blase, Scribe. 09/12/16. 1:14 PM.   History   Chief Complaint Chief Complaint  Patient presents with  . Dental Pain   The history is provided by the patient and medical records. No language interpreter was used.    [name] is a 46 y.o. female with h/o dental problems who presents to the Emergency Department with concern for gradually worsening, severe lower L dental pain s/p a tooth extraction on 08/24/2016. She states the surrounding socket did not heal following her recent tooth extraction and she believes a nerve is exposed. She describes sharp, throbbing, constant pain radiating to the L ear, worse when eating. Pt given #12 Norco s/p tooth extraction at the site; she states she used all of these within 2 days without relief. Pt given #20 Tylenol #3's on 09/08/2016; this has not provided relief. Pt has also taken tylenol and ibuprofen without relief. No other modifying factors noted. She has not used orajel. Pt has not been prescribed any abx for this; she states she has contacted the dental specialist who performed the extraction but no F/U appointment has been scheduled. Pt has decreased tobacco smoking since onset of pain. Denies fever, chills, facial swelling and neck pain  Past Medical History:  Diagnosis Date  . Carpal tunnel syndrome, bilateral   . Diabetes mellitus without complication ([place])   . Diverticulitis   . PONV (postoperative nausea and vomiting)   . Sciatica     Patient Active Problem List   Diagnosis Date Noted  . Abdominal pain 02/17/2015  . Nausea without vomiting 02/17/2015  . Diverticulitis of intestine with abscess 01/08/2015  . Acute diverticulitis 01/08/2015  . Nausea  12/31/2014  . Diverticulitis 12/31/2014  . Hyperglycemia 12/31/2014  . Medication reaction 12/31/2014  . Diverticulitis of large intestine without perforation or abscess without bleeding   . Thrush, oral   . Fatty liver 03/11/2014  . Family hx of colon cancer requiring screening colonoscopy 03/11/2014  . Rt flank pain 03/11/2014  . Ulnar nerve compression 06/14/2011  . 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 LOW BACK PAIN 11/07/2007  . SCIATICA 11/07/2007  . LUMBAR SPASM 11/07/2007    Past Surgical History:  Procedure Laterality Date  . ablasion of uterus    . CARPAL TUNNEL RELEASE  11/10/2010   Procedure: CARPAL TUNNEL RELEASE;  Surgeon: Arther Abbott, MD;  Location: [facility];  Service: Orthopedics;  Laterality: Right;  . CHOLECYSTECTOMY     [facility]  . COLON RESECTION N/A 04/08/2015   Procedure: LAPAROSCOPIC HAND ASSISTED PARTIAL COLECTOMY  CONVERTED TO OPEN AT 9528;  Surgeon: Aviva Signs, MD;  Location: [facility];  Service: General;  Laterality: N/A;  . COLONOSCOPY N/A 03/28/2014   Procedure: COLONOSCOPY;  Surgeon: Rogene Houston, MD;  Location: [facility];  Service: Endoscopy;  Laterality: N/A;  730  . KNEE SURGERY    . PARTIAL COLECTOMY N/A 04/08/2015   Procedure: PARTIAL COLECTOMY;  Surgeon: Aviva Signs, MD;  Location: [facility];  Service: General;  Laterality: N/A;  . right knee arthroscopy    . TUBAL LIGATION      OB History    No data available       Home Medications    Prior to Admission medications  Medication Sig Start Date End Date Taking? Authorizing Provider  acetaminophen (TYLENOL) 500 MG tablet Take 500 mg by mouth every 6 (six) hours as needed for moderate pain.   Yes [provider]  acetaminophen-codeine (TYLENOL #3) 300-30 MG tablet Take 1-2 tablets by mouth. Every 4 to 6 hours as needed for pain. 09/08/16  Yes [provider]  amoxicillin-clavulanate (AUGMENTIN) 875-125 MG tablet Take 1 tablet by mouth 2 (two) times  daily. Patient not taking: Reported on 09/12/2016 06/10/16   Barnet Glasgow, NP    Family History Family History  Problem Relation Age of Onset  . Diabetes type I Mother   . Hyperlipidemia Mother   . Diabetes type II Father   . Hypertension Father   . Hyperlipidemia Father   . Diabetes type II Brother   . Diabetes type II Brother   . Cancer Unknown   . Diabetes Unknown   . Arthritis Unknown   . Asthma Unknown   . Anesthesia problems Neg Hx     Social History Social History  Substance Use Topics  . Smoking status: Current Every Day Smoker    Packs/day: 0.50    Years: 26.00    Types: Cigarettes  . Smokeless tobacco: Never Used  . Alcohol use No     Allergies   Flagyl [metronidazole] and Hydrocodone   Review of Systems Review of Systems  Constitutional: Negative for appetite change, fatigue and fever.  HENT: Positive for dental problem and ear pain. Negative for drooling and trouble swallowing.   Gastrointestinal: Negative for nausea and vomiting.  All other systems reviewed and are negative.    Physical Exam Updated Vital Signs BP (!) 162/90 (BP Location: Right Arm)   Pulse 85   Temp 98.9 F (37.2 C) (Oral)   Resp 20   Ht 5\' 5"  (1.651 m)   Wt 215 lb (97.5 kg)   SpO2 97%   BMI 35.78 kg/m   Physical Exam  Constitutional: She is oriented to person, place, and time. She appears well-developed and well-nourished.  HENT:  Head: Normocephalic.  Mouth/Throat: Oropharynx is clear and moist. No oropharyngeal exudate.  Recent dental extraction L lower second molar exposed socket, mild erythema of the surrounding gingiva, no abscess  Eyes: EOM are normal.  Neck: Normal range of motion.  Cardiovascular: Normal rate, regular rhythm and normal heart sounds.   Pulmonary/Chest: Effort normal and breath sounds normal. She has no wheezes.  Abdominal: She exhibits no distension.  Musculoskeletal: Normal range of motion.  Neurological: She is alert and oriented to  person, place, and time.  Psychiatric: She has a normal mood and affect.  Nursing note and vitals reviewed.    ED Treatments / Results  DIAGNOSTIC STUDIES: Oxygen Saturation is 97% on RA, NL by my interpretation.    COORDINATION OF CARE: 1:05 PM-Discussed next steps with pt. Pt verbalized understanding and is agreeable with the plan. Will order medications.   Labs (all labs ordered are listed, but only abnormal results are displayed) Labs Reviewed - No data to display  EKG  EKG Interpretation None       Radiology No results found.  Procedures Procedures (including critical care time)  Medications Ordered in ED Medications - No data to display   Initial Impression / Assessment and Plan / ED Course  I have reviewed the triage vital signs and the nursing notes.  Pertinent labs & imaging results that were available during my care of the patient were reviewed by me and considered  in my medical decision making (see chart for details).     Pt non-toxic appearing.  Vitals stable.  Pt agrees to dental f/u, rx for abx and short course of pain medication.     Final Clinical Impressions(s) / ED Diagnoses   Final diagnoses:  Pain, dental    New Prescriptions New Prescriptions   No medications on file   I personally performed the services described in this documentation, which was scribed in my presence. The recorded information has been reviewed and is accurate.    Kem Parkinson, PA-C 09/16/16 2246    Milton Ferguson, MD 09/17/16 [phone]

## 2016-09-12 NOTE — ED Triage Notes (Addendum)
Patient states she had tooth removed on May 2 and is still having pain. States "he gave me 12 norco but I used them up in about 2 days."

## 2016-09-12 NOTE — Discharge Instructions (Signed)
Follow-up with your dentist this week.  Do not take any additional anti-inflammatory medications while taking these prescriptions

## 2016-10-03 ENCOUNTER — Encounter: Payer: Self-pay | Admitting: Orthopedic Surgery

## 2016-10-03 ENCOUNTER — Ambulatory Visit (INDEPENDENT_AMBULATORY_CARE_PROVIDER_SITE_OTHER): Payer: PRIVATE HEALTH INSURANCE

## 2016-10-03 ENCOUNTER — Ambulatory Visit (INDEPENDENT_AMBULATORY_CARE_PROVIDER_SITE_OTHER): Payer: PRIVATE HEALTH INSURANCE | Admitting: Orthopedic Surgery

## 2016-10-03 VITALS — BP 145/89 | HR 75 | Ht 66.0 in | Wt 217.0 lb

## 2016-10-03 DIAGNOSIS — M25562 Pain in left knee: Secondary | ICD-10-CM | POA: Diagnosis not present

## 2016-10-03 DIAGNOSIS — S83242A Other tear of medial meniscus, current injury, left knee, initial encounter: Secondary | ICD-10-CM | POA: Diagnosis not present

## 2016-10-03 NOTE — Progress Notes (Signed)
NEW PATIENT OFFICE VISIT    Chief Complaint  Patient presents with  . Knee Pain    Left knee pain, ni injury.    46 year old female presented with two-week history of left knee pain. She said the knee started hurting in the back but then she twisted the knee in her kitchen and since that time she's had constant dull aching severe debilitating medial knee pain. She says she is okay as long as she walks straight up but she pivots or twists on the knee she feels intense pain. She says it's hard for her to straighten her knee is very tight when she bends the knee. She did take some ibuprofen which decreased the pain but did not improve her function    Review of Systems  Constitutional: Negative for chills, fever and weight loss.  Respiratory: Negative for shortness of breath.   Cardiovascular: Negative for chest pain.  Neurological: Negative for tingling and focal weakness.    Past Medical History:  Diagnosis Date  . Carpal tunnel syndrome, bilateral   . Diabetes mellitus without complication ([place])   . Diverticulitis   . PONV (postoperative nausea and vomiting)   . Sciatica     Past Surgical History:  Procedure Laterality Date  . ablasion of uterus    . CARPAL TUNNEL RELEASE  11/10/2010   Procedure: CARPAL TUNNEL RELEASE;  Surgeon: Arther Abbott, MD;  Location: [facility];  Service: Orthopedics;  Laterality: Right;  . CHOLECYSTECTOMY     [facility]  . COLON RESECTION N/A 04/08/2015   Procedure: LAPAROSCOPIC HAND ASSISTED PARTIAL COLECTOMY  CONVERTED TO OPEN AT 5573;  Surgeon: Aviva Signs, MD;  Location: [facility];  Service: General;  Laterality: N/A;  . COLONOSCOPY N/A 03/28/2014   Procedure: COLONOSCOPY;  Surgeon: Rogene Houston, MD;  Location: [facility];  Service: Endoscopy;  Laterality: N/A;  730  . KNEE SURGERY    . PARTIAL COLECTOMY N/A 04/08/2015   Procedure: PARTIAL COLECTOMY;  Surgeon: Aviva Signs, MD;  Location: [facility];  Service: General;  Laterality: N/A;  . right knee  arthroscopy    . TUBAL LIGATION      Family History  Problem Relation Age of Onset  . Diabetes type I Mother   . Hyperlipidemia Mother   . Diabetes type II Father   . Hypertension Father   . Hyperlipidemia Father   . Diabetes type II Brother   . Diabetes type II Brother   . Cancer Unknown   . Diabetes Unknown   . Arthritis Unknown   . Asthma Unknown   . Anesthesia problems Neg Hx    Social History  Substance Use Topics  . Smoking status: Current Every Day Smoker    Packs/day: 0.50    Years: 26.00    Types: Cigarettes  . Smokeless tobacco: Never Used  . Alcohol use No    BP (!) 145/89   Pulse 75   Ht 5\' 6"  (1.676 m)   Wt 217 lb (98.4 kg)   BMI 35.02 kg/m   Physical Exam  Constitutional: She is oriented to person, place, and time. She appears well-developed and well-nourished.  Neurological: She is alert and oriented to person, place, and time.  Psychiatric: She has a normal mood and affect.  Vitals reviewed.   Right Knee Exam   Tenderness  The patient is experiencing no tenderness.     Range of Motion  Extension: normal  Flexion: normal   Muscle Strength   The patient has normal right  knee strength.  Tests  McMurray:  Medial - negative Lateral - negative Drawer:       Anterior - negative    Posterior - negative Varus: negative Valgus: negative  Other  Erythema: absent Scars: absent Sensation: normal Pulse: present Swelling: none   Left Knee Exam   Tenderness  The patient is experiencing tenderness in the medial joint line.  Range of Motion  Extension:  -5 normal  Flexion:  120 normal   Muscle Strength   The patient has normal left knee strength.  Tests  McMurray:  Medial - positive Lateral - negative Drawer:       Anterior - negative     Posterior - negative Varus: negative Valgus: negative  Other  Erythema: absent Scars: absent Sensation: normal Pulse: present Swelling: none      X-ray today shows mild arthritis  medial compartment  Encounter Diagnoses  Name Primary?  . Acute pain of left knee Yes  . Acute medial meniscus tear of left knee, initial encounter      PLAN:   Continue ibuprofen, ice and relative rest.  MRI left knee. I told patient she will probably need surgery if the MRI is positive

## 2016-10-03 NOTE — Patient Instructions (Addendum)
MRI WILL BE SCHEDULED    Meniscus Tear A meniscus tear is a knee injury in which a piece of the meniscus is torn. The meniscus is a thick, rubbery, wedge-shaped cartilage in the knee. Two menisci are located in each knee. They sit between the upper bone (femur) and lower bone (tibia) that make up the knee joint. Each meniscus acts as a shock absorber for the knee. A torn meniscus is one of the most common types of knee injuries. This injury can range from mild to severe. Surgery may be needed for a severe tear. What are the causes? This injury may be caused by any squatting, twisting, or pivoting movement. Sports-related injuries are the most common cause. These often occur from:  Running and stopping suddenly.  Changing direction.  Being tackled or knocked off your feet.  As people get older, their meniscus gets thinner and weaker. In these people, tears can happen more easily, such as from climbing stairs. What increases the risk? This injury is more likely to happen to:  People who play contact sports.  Males.  People who are 95?45 years of age.  What are the signs or symptoms? Symptoms of this injury include:  Knee pain, especially at the side of the knee joint. You may feel pain when the injury occurs, or you may only hear a pop and feel pain later.  A feeling that your knee is clicking, catching, locking, or giving way.  Not being able to fully bend or extend your knee.  Bruising or swelling in your knee.  How is this diagnosed? This injury may be diagnosed based on your symptoms and a physical exam. The physical exam may include:  Moving your knee in different ways.  Feeling for tenderness.  Listening for a clicking sound.  Checking if your knee locks or catches.  You may also have tests, such as:  X-rays.  MRI.  A procedure to look inside your knee with a narrow surgical telescope (arthroscopy).  You may be referred to a knee specialist (orthopedic  surgeon). How is this treated? Treatment for this injury depends on the severity of the tear. Treatment for a mild tear may include:  Rest.  Medicine to reduce pain and swelling. This is usually a nonsteroidal anti-inflammatory drug (NSAID).  A knee brace or an elastic sleeve or wrap.  Using crutches or a walker to keep weight off your knee and to help you walk.  Exercises to strengthen your knee (physical therapy).  You may need surgery if you have a severe tear or if other treatments are not working. Follow these instructions at home: Managing pain and swelling  Take over-the-counter and prescription medicines only as told by your health care provider.  If directed, apply ice to the injured area: ? Put ice in a plastic bag. ? Place a towel between your skin and the bag. ? Leave the ice on for 20 minutes, 2-3 times per day.  Raise (elevate) the injured area above the level of your heart while you are sitting or lying down. Activity  Do not use the injured limb to support your body weight until your health care provider says that you can. Use crutches or a walker as told by your health care provider.  Return to your normal activities as told by your health care provider. Ask your health care provider what activities are safe for you.  Perform range-of-motion exercises only as told by your health care provider.  Begin doing exercises to  strengthen your knee and leg muscles only as told by your health care provider. After you recover, your health care provider may recommend these exercises to help prevent another injury. General instructions  Use a knee brace or elastic wrap as told by your health care provider.  Keep all follow-up visits as told by your health care provider. This is important. Contact a health care provider if:  You have a fever.  Your knee becomes red, tender, or swollen.  Your pain medicine is not helping.  Your symptoms get worse or do not improve  after 2 weeks of home care. This information is not intended to replace advice given to you by your health care provider. Make sure you discuss any questions you have with your health care provider. Document Released: 07/02/2002 Document Revised: 09/17/2015 Document Reviewed: 08/04/2014 Elsevier Interactive Patient Education  [employer].

## 2016-10-06 ENCOUNTER — Ambulatory Visit (HOSPITAL_COMMUNITY)
Admission: RE | Admit: 2016-10-06 | Discharge: 2016-10-06 | Disposition: A | Discharge status: Home / Self Care | Payer: PRIVATE HEALTH INSURANCE | Source: Ambulatory Visit | Attending: Orthopedic Surgery | Admitting: Orthopedic Surgery

## 2016-10-06 DIAGNOSIS — X58XXXA Exposure to other specified factors, initial encounter: Secondary | ICD-10-CM | POA: Insufficient documentation

## 2016-10-06 DIAGNOSIS — M25462 Effusion, left knee: Secondary | ICD-10-CM | POA: Insufficient documentation

## 2016-10-06 DIAGNOSIS — S83242A Other tear of medial meniscus, current injury, left knee, initial encounter: Secondary | ICD-10-CM | POA: Insufficient documentation

## 2016-10-10 ENCOUNTER — Ambulatory Visit (INDEPENDENT_AMBULATORY_CARE_PROVIDER_SITE_OTHER): Payer: PRIVATE HEALTH INSURANCE | Admitting: Orthopedic Surgery

## 2016-10-10 ENCOUNTER — Encounter: Payer: Self-pay | Admitting: Orthopedic Surgery

## 2016-10-10 DIAGNOSIS — M238X2 Other internal derangements of left knee: Secondary | ICD-10-CM

## 2016-10-10 DIAGNOSIS — M25562 Pain in left knee: Secondary | ICD-10-CM

## 2016-10-10 NOTE — Progress Notes (Addendum)
Follow-up visit  MRI follow-up to get results and preop for surgery left knee   Patient ID: [name], female   DOB: Oct 01, 1970, 46 y.o.   MRN: [id number]  Chief Complaint  Patient presents with  . Follow-up    MRI REVIEW LT KNEE, Left knee pain     46 year old female presented with two-week history of left knee pain. She said the knee started hurting in the back but then she twisted the knee in her kitchen and since that time she's had constant dull aching severe debilitating medial knee pain. She says she is okay as long as she walks straight up but she pivots or twists on the knee she feels intense pain. She says it's hard for her to straighten her knee is very tight when she bends the knee. She did take some ibuprofen which decreased the pain but did not improve her function  MRI was obtained no change in symptoms patient still having severe medial knee pain    Review of Systems  Constitutional: Negative for chills, fever and weight loss.  Respiratory: Negative for shortness of breath.   Cardiovascular: Negative for chest pain.  Neurological: Negative for tingling and focal weakness.  All other systems reviewed and are negative.   Past Medical History:  Diagnosis Date  . Carpal tunnel syndrome, bilateral   . Diabetes mellitus without complication ([place])   . Diverticulitis   . PONV (postoperative nausea and vomiting)   . Sciatica    Past Surgical History:  Procedure Laterality Date  . ablasion of uterus    . CARPAL TUNNEL RELEASE  11/10/2010   Procedure: CARPAL TUNNEL RELEASE;  Surgeon: Arther Abbott, MD;  Location: [facility];  Service: Orthopedics;  Laterality: Right;  . CHOLECYSTECTOMY     [facility]  . COLON RESECTION N/A 04/08/2015   Procedure: LAPAROSCOPIC HAND ASSISTED PARTIAL COLECTOMY  CONVERTED TO OPEN AT 8182;  Surgeon: Aviva Signs, MD;  Location: [facility];  Service: General;  Laterality: N/A;  . COLONOSCOPY N/A 03/28/2014   Procedure: COLONOSCOPY;  Surgeon: Rogene Houston, MD;  [facility];  Service: Endoscopy;  Laterality: N/A;  730  . KNEE SURGERY    . PARTIAL COLECTOMY N/A 04/08/2015   Procedure: PARTIAL COLECTOMY;  Surgeon: Aviva Signs, MD;  Location: [facility];  Service: General;  Laterality: N/A;  . right knee arthroscopy    . TUBAL LIGATION     Family History  Problem Relation Age of Onset  . Diabetes type I Mother   . Hyperlipidemia Mother   . Diabetes type II Father   . Hypertension Father   . Hyperlipidemia Father   . Diabetes type II Brother   . Diabetes type II Brother   . Cancer Unknown   . Diabetes Unknown   . Arthritis Unknown   . Asthma Unknown   . Anesthesia problems Neg Hx       Gen. appearance is normal grooming and hygiene normal Orientation to person place and time normal Mood normal Gait is antalgic favoring the left knee  Right Knee Exam   Tenderness  The patient is experiencing no tenderness.     Range of Motion  Extension: normal  Flexion: normal   Muscle Strength   The patient has normal right knee strength.  Tests  McMurray:  Medial - negative Lateral - negative Drawer:       Anterior - negative    Posterior - negative Varus: negative Valgus: negative  Other  Erythema: absent  Scars: absent Sensation: normal Pulse: present Swelling: none   Left Knee Exam   Tenderness  The patient is experiencing tenderness in the medial joint line.  Range of Motion  Extension:  -5 normal  Flexion:  120 normal   Muscle Strength   The patient has normal left knee strength.  Tests  McMurray:  Medial - positive Lateral - negative Drawer:       Anterior - negative     Posterior - negative Varus: negative Valgus: negative  Other  Erythema: absent Scars: absent Sensation: normal Pulse: present Swelling: none      This procedure has been fully reviewed with the patient and written informed consent has been obtained.  A/P  Medical decision-making  Encounter Diagnoses  Name  Primary?  . Acute pain of left knee Yes  . 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 Partial-thickness cartilage loss the medial tibial plateau. High-grade partial-thickness cartilage loss of the medial femoral condyles with a 5 mm area of full-thickness cartilage loss involving the weight-bearing surface of the medial femoral condyle with subchondral reactive marrow edema. 2. Moderate joint effusion.     Electronically Signed   By: Kathreen Devoid   On: 10/06/2016 10:54   My interpretation is that the patient does not have a medial meniscal tear she does have a high-grade lesion of the medial femoral condyle with stress reaction in the bone and then there is also kissing lesion of the medial tibial plateau   I offered the patient arthroscopy of the left knee with microfracture and debridement for this chondral lesion over the medial femoral condyle and tibial plateau.-[id number]  She is agreeable secondary to continued on going pain in difficulty weightbearing  She may need to be out of work up into the surgery date and then 4-6 weeks after  Arther Abbott, MD 10/10/2016 10:15 AM

## 2016-10-10 NOTE — Patient Instructions (Signed)
You have decided to proceed with operative arthroscopy of the knee. You have decided not to continue with nonoperative measures such as but not limited to oral medication, weight loss, activity modification, physical therapy, bracing, or injection.  We will perform operative arthroscopy of the knee. Some of the risks associated with arthroscopic surgery of the knee include but are not limited to Bleeding Infection Swelling Stiffness Blood clot Pain  If you're not comfortable with these risks and would like to continue with nonoperative treatment please let Dr. Harrison know prior to your surgery. 

## 2016-10-11 ENCOUNTER — Encounter: Payer: Self-pay | Admitting: *Deleted

## 2016-10-11 NOTE — Progress Notes (Signed)
Pre auth not required for outpatient services per patient plan  Reference Stacy M. 10/11/16 8:42am

## 2016-10-12 NOTE — Patient Instructions (Signed)
[name]  10/12/2016     @PREFPERIOPPHARMACY @   Your procedure is scheduled on 7:00.  Report to [facility] at 7:00 A.M.  Call this number if you have problems the morning of surgery:  [phone]   Remember:  Do not eat food or drink liquids after midnight.  Take these medicines the morning of surgery with A SIP OF WATER None   Do not wear jewelry, make-up or nail polish.  Do not wear lotions, powders, or perfumes, or deoderant.  Do not shave 48 hours prior to surgery.  Men may shave face and neck.  Do not bring valuables to the hospital.  [facility] is not responsible for any belongings or valuables.  Contacts, dentures or bridgework may not be worn into surgery.  Leave your suitcase in the car.  After surgery it may be brought to your room.  For patients admitted to the hospital, discharge time will be determined by your treatment team.  Patients discharged the day of surgery will not be allowed to drive home.   Please read over the following fact sheets that you were given. Surgical Site Infection Prevention and Anesthesia Post-op Instructions     PATIENT INSTRUCTIONS POST-ANESTHESIA  IMMEDIATELY FOLLOWING SURGERY:  Do not drive or operate machinery for the first twenty four hours after surgery.  Do not make any important decisions for twenty four hours after surgery or while taking narcotic pain medications or sedatives.  If you develop intractable nausea and vomiting or a severe headache please notify your doctor immediately.  FOLLOW-UP:  Please make an appointment with your surgeon as instructed. You do not need to follow up with anesthesia unless specifically instructed to do so.  WOUND CARE INSTRUCTIONS (if applicable):  Keep a dry clean dressing on the anesthesia/puncture wound site if there is drainage.  Once the wound has quit draining you may leave it open to air.  Generally you should leave the bandage intact for twenty four hours unless there is  drainage.  If the epidural site drains for more than 36-48 hours please call the anesthesia department.  QUESTIONS?:  Please feel free to call your physician or the hospital operator if you have any questions, and they will be happy to assist you.      Knee Arthroscopy Knee arthroscopy is a surgical procedure that is used to examine the inside of your knee joint and repair any damage. The surgeon puts a small, lighted instrument with a camera on the tip (arthroscope) through a small incision in your knee. The camera sends pictures to a monitor in the operating room. Your surgeon uses those pictures to guide the surgical instruments through other incisions to the area of damage. Knee arthroscopy can be used to treat many types of knee problems. It may be used:  To repair a torn ligament.  To repair or remove damaged tissue.  To remove a fluid-filled sac (cyst) from your knee.  Tell a health care provider about:  Any allergies you have.  All medicines you are taking, including vitamins, herbs, eye drops, creams, and over-the-counter medicines.  Any problems you or family members have had with anesthetic medicines.  Any blood disorders you have.  Any surgeries you have had.  Any medical conditions you have. What are the risks? Generally, this is a safe procedure. However, problems may occur, including:  Infection.  Bleeding.  Damage to blood vessels, nerves, or structures of your knee.  A blood clot that forms in  your leg and travels to your lung.  Failure to relieve symptoms.  What happens before the procedure?  Ask your health care provider about: ? Changing or stopping your regular medicines. This is especially important if you are taking diabetes medicines or blood thinners. ? Taking medicines such as aspirin and ibuprofen. These medicines can thin your blood. Do not take these medicines before your procedure if your health care provider instructs you not to.  Follow  your health care provider's instructions about eating or drinking restrictions.  Plan to have someone take you home after the procedure.  If you go home right after the procedure, plan to have someone with you for 24 hours.  Do not drink alcohol unless your health care provider says that you can.  Do not use any tobacco products, including cigarettes, chewing tobacco, or electronic cigarettes unless your health care provider says that you can. If you need help quitting, ask your health care provider.  You may have a physical exam. What happens during the procedure?  An IV tube will be inserted into one of your veins.  You will be given one or more of the following: ? A medicine that helps you relax (sedative). ? A medicine that numbs the area (local anesthetic). ? A medicine that makes you fall asleep (general anesthetic). ? A medicine that is injected into your spine that numbs the area below and slightly above the injection site (spinal anesthetic). ? A medicine that is injected into an area of your body that numbs everything below the injection site (regional anesthetic).  A cuff may be placed around your upper leg to slow bleeding during the procedure.  The surgeon will make a small number of incisions around your knee.  Your knee joint will be flushed and filled with a germ-free (sterile) solution.  The arthroscope will be passed through an incision into your knee joint.  More instruments will be passed through other incisions to repair your knee as needed.  The fluid will be removed from your knee.  The incisions will be closed with adhesive strips or stitches (sutures).  A bandage (dressing) will be placed over your knee. The procedure may vary among health care providers and hospitals. What happens after the procedure?  Your blood pressure, heart rate, breathing rate and blood oxygen level will be monitored often until the medicines you were given have worn off.  You  may be given medicine for pain.  You may get crutches to help you walk without using your knee to support your body weight.  You may have to wear compression stockings. These stocking help to prevent blood clots and reduce swelling in your legs. This information is not intended to replace advice given to you by your health care provider. Make sure you discuss any questions you have with your health care provider. Document Released: 04/08/2000 Document Revised: 09/17/2015 Document Reviewed: 04/07/2014 Elsevier Interactive Patient Education  2017 [employer].

## 2016-10-14 ENCOUNTER — Encounter (HOSPITAL_COMMUNITY): Payer: Self-pay

## 2016-10-14 ENCOUNTER — Encounter (HOSPITAL_COMMUNITY)
Admission: RE | Admit: 2016-10-14 | Discharge: 2016-10-14 | Disposition: A | Discharge status: Home / Self Care | Payer: PRIVATE HEALTH INSURANCE | Source: Ambulatory Visit | Attending: Orthopedic Surgery | Admitting: Orthopedic Surgery

## 2016-10-14 DIAGNOSIS — M25562 Pain in left knee: Secondary | ICD-10-CM | POA: Insufficient documentation

## 2016-10-14 DIAGNOSIS — Z01812 Encounter for preprocedural laboratory examination: Secondary | ICD-10-CM | POA: Diagnosis present

## 2016-10-14 LAB — CBC
HCT: 42 % (ref 36.0–46.0)
Hemoglobin: 14.2 g/dL (ref 12.0–15.0)
MCH: 29.8 pg (ref 26.0–34.0)
MCHC: 33.8 g/dL (ref 30.0–36.0)
MCV: 88.2 fL (ref 78.0–100.0)
PLATELETS: 320 10*3/uL (ref 150–400)
RBC: 4.76 MIL/uL (ref 3.87–5.11)
RDW: 14.2 % (ref 11.5–15.5)
WBC: 12.4 10*3/uL — AB (ref 4.0–10.5)

## 2016-10-14 LAB — BASIC METABOLIC PANEL
ANION GAP: 9 (ref 5–15)
BUN: 15 mg/dL (ref 6–20)
CALCIUM: 8.8 mg/dL — AB (ref 8.9–10.3)
CO2: 22 mmol/L (ref 22–32)
Chloride: 106 mmol/L (ref 101–111)
Creatinine, Ser: 0.63 mg/dL (ref 0.44–1.00)
GLUCOSE: 204 mg/dL — AB (ref 65–99)
Potassium: 4 mmol/L (ref 3.5–5.1)
SODIUM: 137 mmol/L (ref 135–145)

## 2016-10-14 LAB — HCG, SERUM, QUALITATIVE: Preg, Serum: NEGATIVE

## 2016-10-14 LAB — SURGICAL PCR SCREEN
MRSA, PCR: NEGATIVE
Staphylococcus aureus: NEGATIVE

## 2016-10-19 MED ORDER — CEFAZOLIN SODIUM-DEXTROSE 2-4 GM/100ML-% IV SOLN
2.0000 g | INTRAVENOUS | Status: AC
  Filled 2016-10-19: qty 100

## 2016-10-19 NOTE — H&P (Signed)
[name] is an 46 y.o. female.   Chief Complaint: Left knee pain HPI: 46 year old female presented with two-week history of left knee pain. She said the knee started hurting in the back but then she twisted the knee in her kitchen and since that time she's had constant dull aching severe debilitating medial knee pain. She says she is okay as long as she walks straight up but she pivots or twists on the knee she feels intense pain. She says it's hard for her to straighten her knee is very tight when she bends the knee. She did take some ibuprofen which decreased the pain but did not improve her function  MRI was obtained no change in symptoms patient still having severe medial knee pain  She has opted for surgical treatment of the cartilage lesion in the knee  Past Medical History:  Diagnosis Date  . Carpal tunnel syndrome, bilateral   . Diabetes mellitus without complication ([place])    no meds; diet controlled  . Diverticulitis   . PONV (postoperative nausea and vomiting)   . Sciatica     Past Surgical History:  Procedure Laterality Date  . ablasion of uterus    . CARPAL TUNNEL RELEASE  11/10/2010   Procedure: CARPAL TUNNEL RELEASE;  Surgeon: Arther Abbott, MD;  Location: [facility];  Service: Orthopedics;  Laterality: Right;  . CHOLECYSTECTOMY     [facility]  . COLON RESECTION N/A 04/08/2015   Procedure: LAPAROSCOPIC HAND ASSISTED PARTIAL COLECTOMY  CONVERTED TO OPEN AT 5400;  Surgeon: Aviva Signs, MD;  Location: [facility];  Service: General;  Laterality: N/A;  . COLONOSCOPY N/A 03/28/2014   Procedure: COLONOSCOPY;  Surgeon: Rogene Houston, MD;  Location: [facility];  Service: Endoscopy;  Laterality: N/A;  730  . KNEE SURGERY    . PARTIAL COLECTOMY N/A 04/08/2015   Procedure: PARTIAL COLECTOMY;  Surgeon: Aviva Signs, MD;  Location: [facility];  Service: General;  Laterality: N/A;  . right knee arthroscopy    . TUBAL LIGATION      Family History  Problem Relation Age of Onset  .  Diabetes type I Mother   . Hyperlipidemia Mother   . Diabetes type II Father   . Hypertension Father   . Hyperlipidemia Father   . Diabetes type II Brother   . Diabetes type II Brother   . Cancer Unknown   . Diabetes Unknown   . Arthritis Unknown   . Asthma Unknown   . Anesthesia problems Neg Hx    Social History:  reports that she has been smoking Cigarettes.  She has a 13.00 pack-year smoking history. She has never used smokeless tobacco. She reports that she does not drink alcohol or use drugs.  Allergies:  Allergies  Allergen Reactions  . Flagyl [Metronidazole] Nausea Only  . Hydrocodone Nausea And Vomiting    Can take need to take with medication for nausea and vomitting    No prescriptions prior to admission.    No results found for this or any previous visit (from the past 48 hour(s)). No results found.  ROS Review of Systems  Constitutional: Negative for chills, fever and weight loss.  Respiratory: Negative for shortness of breath.   Cardiovascular: Negative for chest pain.  Neurological: Negative for tingling and focal weakness.  All other systems reviewed and are negative.  There were no vitals taken for this visit. Physical Exam  She is awake alert and oriented 3 her mood and affect are normal her gait  shows a slight limp  Normal grooming and hygiene normal appearance  Left knee medial joint line tenderness range of motion -5-120 ligament stable strength normal skin intact no erythema lymph nodes negative normal sensation and normal 2+ dorsalis pedis pulse  Her upper extremities normal range of motion stability strength alignment skin lymph nodes negative neurovascular exam normal  Opposite knee range of motion stability strength and alignment   Assessment/Plan  MRI report IMPRESSION: 1. Partial-thickness cartilage loss the medial tibial plateau. High-grade partial-thickness cartilage loss of the medial femoral condyles with a 5 mm area of  full-thickness cartilage loss involving the weight-bearing surface of the medial femoral condyle with subchondral reactive marrow edema. 2. Moderate joint effusion.   Diagnosis osteoarthritis and chondral full-thickness lesion medial femoral condyle  Plan is for arthroscopic microfracture of the left knee  Arther Abbott, MD 10/19/2016, 2:19 PM

## 2016-10-20 ENCOUNTER — Encounter (HOSPITAL_COMMUNITY)
Admission: RE | Disposition: A | Discharge status: Home / Self Care | Payer: Self-pay | Source: Ambulatory Visit | Attending: Orthopedic Surgery

## 2016-10-20 ENCOUNTER — Encounter (HOSPITAL_COMMUNITY): Payer: Self-pay

## 2016-10-20 ENCOUNTER — Ambulatory Visit (HOSPITAL_COMMUNITY)
Admission: RE | Admit: 2016-10-20 | Discharge: 2016-10-20 | Disposition: A | Discharge status: Home / Self Care | Payer: PRIVATE HEALTH INSURANCE | Source: Ambulatory Visit | Attending: Orthopedic Surgery | Admitting: Orthopedic Surgery

## 2016-10-20 ENCOUNTER — Ambulatory Visit (HOSPITAL_COMMUNITY): Payer: PRIVATE HEALTH INSURANCE | Admitting: Anesthesiology

## 2016-10-20 DIAGNOSIS — M238X2 Other internal derangements of left knee: Secondary | ICD-10-CM | POA: Diagnosis not present

## 2016-10-20 DIAGNOSIS — Z8719 Personal history of other diseases of the digestive system: Secondary | ICD-10-CM | POA: Insufficient documentation

## 2016-10-20 DIAGNOSIS — M25462 Effusion, left knee: Secondary | ICD-10-CM | POA: Diagnosis not present

## 2016-10-20 DIAGNOSIS — Z888 Allergy status to other drugs, medicaments and biological substances status: Secondary | ICD-10-CM | POA: Insufficient documentation

## 2016-10-20 DIAGNOSIS — Z6836 Body mass index (BMI) 36.0-36.9, adult: Secondary | ICD-10-CM | POA: Insufficient documentation

## 2016-10-20 DIAGNOSIS — M1712 Unilateral primary osteoarthritis, left knee: Secondary | ICD-10-CM | POA: Insufficient documentation

## 2016-10-20 DIAGNOSIS — E669 Obesity, unspecified: Secondary | ICD-10-CM | POA: Insufficient documentation

## 2016-10-20 DIAGNOSIS — Z885 Allergy status to narcotic agent status: Secondary | ICD-10-CM | POA: Diagnosis not present

## 2016-10-20 DIAGNOSIS — E119 Type 2 diabetes mellitus without complications: Secondary | ICD-10-CM | POA: Insufficient documentation

## 2016-10-20 DIAGNOSIS — M958 Other specified acquired deformities of musculoskeletal system: Secondary | ICD-10-CM | POA: Insufficient documentation

## 2016-10-20 DIAGNOSIS — Z96652 Presence of left artificial knee joint: Secondary | ICD-10-CM

## 2016-10-20 DIAGNOSIS — Z9889 Other specified postprocedural states: Secondary | ICD-10-CM

## 2016-10-20 DIAGNOSIS — M2242 Chondromalacia patellae, left knee: Secondary | ICD-10-CM | POA: Insufficient documentation

## 2016-10-20 DIAGNOSIS — Z9049 Acquired absence of other specified parts of digestive tract: Secondary | ICD-10-CM | POA: Diagnosis not present

## 2016-10-20 HISTORY — PX: KNEE ARTHROSCOPY WITH DRILLING/MICROFRACTURE: SHX6425

## 2016-10-20 LAB — GLUCOSE, CAPILLARY
GLUCOSE-CAPILLARY: 223 mg/dL — AB (ref 65–99)
Glucose-Capillary: 259 mg/dL — ABNORMAL HIGH (ref 65–99)

## 2016-10-20 SURGERY — ARTHROSCOPY, KNEE, WITH ABRASION ARTHROPLASTY OR MICROFRACTURE
Anesthesia: General | Site: Knee | Laterality: Left

## 2016-10-20 MED ORDER — PROMETHAZINE HCL 25 MG/ML IJ SOLN
INTRAMUSCULAR | Status: AC
  Filled 2016-10-20: qty 1

## 2016-10-20 MED ORDER — SODIUM CHLORIDE 0.9% FLUSH
INTRAVENOUS | Status: AC
  Filled 2016-10-20: qty 10

## 2016-10-20 MED ORDER — LACTATED RINGERS IV SOLN
INTRAVENOUS | Status: DC
  Administered 2016-10-20 (×2): via INTRAVENOUS

## 2016-10-20 MED ORDER — FENTANYL CITRATE (PF) 100 MCG/2ML IJ SOLN
25.0000 ug | INTRAMUSCULAR | Status: DC | PRN
  Administered 2016-10-20 (×2): 50 ug via INTRAVENOUS
  Filled 2016-10-20: qty 2

## 2016-10-20 MED ORDER — PROMETHAZINE HCL 25 MG/ML IJ SOLN
6.2500 mg | Freq: Once | INTRAMUSCULAR | Status: AC
  Administered 2016-10-20: 6.25 mg via INTRAVENOUS

## 2016-10-20 MED ORDER — MIDAZOLAM HCL 5 MG/5ML IJ SOLN
INTRAMUSCULAR | Status: DC | PRN
  Administered 2016-10-20: 2 mg via INTRAVENOUS

## 2016-10-20 MED ORDER — PROMETHAZINE HCL 12.5 MG PO TABS: 12.5000 mg | ORAL_TABLET | ORAL | 0 refills | Status: DC | PRN

## 2016-10-20 MED ORDER — BUPIVACAINE-EPINEPHRINE (PF) 0.5% -1:200000 IJ SOLN
INTRAMUSCULAR | Status: AC
  Filled 2016-10-20: qty 30

## 2016-10-20 MED ORDER — ONDANSETRON HCL 4 MG/2ML IJ SOLN
INTRAMUSCULAR | Status: AC
  Filled 2016-10-20: qty 2

## 2016-10-20 MED ORDER — CHLORHEXIDINE GLUCONATE 4 % EX LIQD: 60.0000 mL | Freq: Once | CUTANEOUS | Status: DC

## 2016-10-20 MED ORDER — MIDAZOLAM HCL 2 MG/2ML IJ SOLN
1.0000 mg | Freq: Once | INTRAMUSCULAR | Status: AC | PRN
  Administered 2016-10-20: 2 mg via INTRAVENOUS
  Filled 2016-10-20: qty 2

## 2016-10-20 MED ORDER — HYDROMORPHONE HCL 2 MG PO TABS: 2.0000 mg | ORAL_TABLET | ORAL | 0 refills | Status: DC | PRN

## 2016-10-20 MED ORDER — MIDAZOLAM HCL 2 MG/2ML IJ SOLN
INTRAMUSCULAR | Status: AC
  Filled 2016-10-20: qty 2

## 2016-10-20 MED ORDER — CEFAZOLIN SODIUM-DEXTROSE 2-3 GM-% IV SOLR
INTRAVENOUS | Status: DC | PRN
  Administered 2016-10-20: 2 g via INTRAVENOUS

## 2016-10-20 MED ORDER — PROPOFOL 10 MG/ML IV BOLUS
INTRAVENOUS | Status: AC
  Filled 2016-10-20: qty 20

## 2016-10-20 MED ORDER — DEXAMETHASONE SODIUM PHOSPHATE 10 MG/ML IJ SOLN
INTRAMUSCULAR | Status: DC | PRN
  Administered 2016-10-20: 4 mg via INTRAVENOUS

## 2016-10-20 MED ORDER — FENTANYL CITRATE (PF) 100 MCG/2ML IJ SOLN
INTRAMUSCULAR | Status: DC | PRN
  Administered 2016-10-20 (×2): 100 ug via INTRAVENOUS

## 2016-10-20 MED ORDER — SODIUM CHLORIDE 0.9 % IR SOLN
Status: DC | PRN
  Administered 2016-10-20 (×3): 3000 mL

## 2016-10-20 MED ORDER — ONDANSETRON 4 MG PO TBDP: 4.0000 mg | ORAL_TABLET | Freq: Once | ORAL | Status: DC

## 2016-10-20 MED ORDER — FENTANYL CITRATE (PF) 100 MCG/2ML IJ SOLN
INTRAMUSCULAR | Status: AC
  Filled 2016-10-20: qty 2

## 2016-10-20 MED ORDER — HYDROMORPHONE HCL 1 MG/ML IJ SOLN
0.5000 mg | INTRAMUSCULAR | Status: DC | PRN
  Administered 2016-10-20: 0.5 mg via INTRAVENOUS
  Filled 2016-10-20: qty 1

## 2016-10-20 MED ORDER — INSULIN ASPART 100 UNIT/ML ~~LOC~~ SOLN
4.0000 [IU] | Freq: Once | SUBCUTANEOUS | Status: AC
  Administered 2016-10-20: 08:00:00 via SUBCUTANEOUS
  Filled 2016-10-20: qty 0.04

## 2016-10-20 MED ORDER — ONDANSETRON HCL 4 MG/2ML IJ SOLN
4.0000 mg | Freq: Once | INTRAMUSCULAR | Status: AC
  Administered 2016-10-20: 4 mg via INTRAVENOUS

## 2016-10-20 MED ORDER — BUPIVACAINE-EPINEPHRINE (PF) 0.5% -1:200000 IJ SOLN
INTRAMUSCULAR | Status: DC | PRN
  Administered 2016-10-20: 60 mL via PERINEURAL

## 2016-10-20 MED ORDER — EPINEPHRINE PF 1 MG/ML IJ SOLN
INTRAMUSCULAR | Status: AC
  Filled 2016-10-20: qty 5

## 2016-10-20 MED ORDER — 0.9 % SODIUM CHLORIDE (POUR BTL) OPTIME
TOPICAL | Status: DC | PRN
  Administered 2016-10-20: 1000 mL

## 2016-10-20 MED ORDER — POVIDONE-IODINE 10 % EX SWAB: 2.0000 "application " | Freq: Once | CUTANEOUS | Status: DC

## 2016-10-20 MED ORDER — PROPOFOL 10 MG/ML IV BOLUS
INTRAVENOUS | Status: DC | PRN
  Administered 2016-10-20: 200 mg via INTRAVENOUS

## 2016-10-20 MED FILL — Insulin Aspart Inj 100 Unit/ML: SUBCUTANEOUS | Qty: 0.04 | Status: AC

## 2016-10-20 SURGICAL SUPPLY — 51 items
BAG HAMPER (MISCELLANEOUS) ×2 IMPLANT
BANDAGE ELASTIC 6 LF NS (GAUZE/BANDAGES/DRESSINGS) ×1 IMPLANT
BANDAGE ELASTIC 6 VELCRO NS (GAUZE/BANDAGES/DRESSINGS) ×2 IMPLANT
BIT DRILL 2.4X128 (BIT) ×2 IMPLANT
BLADE AGGRESSIVE PLUS 4.0 (BLADE) ×2 IMPLANT
BLADE SURG SZ11 CARB STEEL (BLADE) ×2 IMPLANT
BNDG CMPR MED 5X6 ELC HKLP NS (GAUZE/BANDAGES/DRESSINGS) ×1
CHLORAPREP W/TINT 26ML (MISCELLANEOUS) ×2 IMPLANT
CLOTH BEACON ORANGE TIMEOUT ST (SAFETY) ×2 IMPLANT
COOLER CRYO IC GRAV AND TUBE (ORTHOPEDIC SUPPLIES) ×1 IMPLANT
COVER PROBE W GEL 5X96 (DRAPES) ×2 IMPLANT
CUFF CRYO KNEE18X23 MED (MISCELLANEOUS) ×1 IMPLANT
CUFF TOURNIQUET SINGLE 34IN LL (TOURNIQUET CUFF) ×1 IMPLANT
DECANTER SPIKE VIAL GLASS SM (MISCELLANEOUS) ×4 IMPLANT
GAUZE SPONGE 4X4 12PLY STRL (GAUZE/BANDAGES/DRESSINGS) ×2 IMPLANT
GAUZE SPONGE 4X4 16PLY XRAY LF (GAUZE/BANDAGES/DRESSINGS) ×2 IMPLANT
GAUZE XEROFORM 5X9 LF (GAUZE/BANDAGES/DRESSINGS) ×2 IMPLANT
GLOVE BIOGEL PI IND STRL 7.0 (GLOVE) ×1 IMPLANT
GLOVE BIOGEL PI INDICATOR 7.0 (GLOVE) ×2
GLOVE SKINSENSE NS SZ8.0 LF (GLOVE) ×1
GLOVE SKINSENSE STRL SZ8.0 LF (GLOVE) ×1 IMPLANT
GLOVE SS N UNI LF 8.5 STRL (GLOVE) ×2 IMPLANT
GOWN STRL REUS W/TWL LRG LVL3 (GOWN DISPOSABLE) ×2 IMPLANT
GOWN STRL REUS W/TWL XL LVL3 (GOWN DISPOSABLE) ×2 IMPLANT
HLDR LEG FOAM (MISCELLANEOUS) ×1 IMPLANT
IV NS IRRIG 3000ML ARTHROMATIC (IV SOLUTION) ×5 IMPLANT
KIT BLADEGUARD II DBL (SET/KITS/TRAYS/PACK) ×2 IMPLANT
KIT ROOM TURNOVER AP CYSTO (KITS) ×2 IMPLANT
LEG HOLDER FOAM (MISCELLANEOUS) ×1
MANIFOLD NEPTUNE II (INSTRUMENTS) ×2 IMPLANT
MARKER SKIN DUAL TIP RULER LAB (MISCELLANEOUS) ×2 IMPLANT
NDL HYPO 18GX1.5 BLUNT FILL (NEEDLE) ×1 IMPLANT
NDL HYPO 21X1.5 SAFETY (NEEDLE) ×1 IMPLANT
NDL SPNL 18GX3.5 QUINCKE PK (NEEDLE) ×1 IMPLANT
NEEDLE HYPO 18GX1.5 BLUNT FILL (NEEDLE) ×2 IMPLANT
NEEDLE HYPO 21X1.5 SAFETY (NEEDLE) ×2 IMPLANT
NEEDLE SPNL 18GX3.5 QUINCKE PK (NEEDLE) ×2 IMPLANT
NS IRRIG 1000ML POUR BTL (IV SOLUTION) ×2 IMPLANT
PACK ARTHRO LIMB DRAPE STRL (MISCELLANEOUS) ×2 IMPLANT
PAD ABD 5X9 TENDERSORB (GAUZE/BANDAGES/DRESSINGS) ×2 IMPLANT
PAD ARMBOARD 7.5X6 YLW CONV (MISCELLANEOUS) ×2 IMPLANT
PADDING CAST COTTON 6X4 STRL (CAST SUPPLIES) ×2 IMPLANT
PADDING WEBRIL 6 STERILE (GAUZE/BANDAGES/DRESSINGS) ×1 IMPLANT
SET ARTHROSCOPY INST (INSTRUMENTS) ×2 IMPLANT
SET ARTHROSCOPY PUMP TUBE (IRRIGATION / IRRIGATOR) ×2 IMPLANT
SET BASIN LINEN APH (SET/KITS/TRAYS/PACK) ×2 IMPLANT
SUT ETHILON 3 0 FSL (SUTURE) ×1 IMPLANT
SYR 30ML LL (SYRINGE) ×2 IMPLANT
SYRINGE 10CC LL (SYRINGE) ×2 IMPLANT
WAND 90 DEG TURBOVAC W/CORD (SURGICAL WAND) ×1 IMPLANT
YANKAUER SUCT BULB TIP 10FT TU (MISCELLANEOUS) ×6 IMPLANT

## 2016-10-20 NOTE — Anesthesia Postprocedure Evaluation (Signed)
Anesthesia Post Note  Patient: [name]  Procedure(s) Performed: Procedure(s) (LRB): LEFT KNEE ARTHROSCOPY WITH MICROFRACTURE (Left)  Patient location during evaluation: PACU Anesthesia Type: General Level of consciousness: awake and alert Pain management: pain level controlled Vital Signs Assessment: post-procedure vital signs reviewed and stable Respiratory status: spontaneous breathing, nonlabored ventilation, respiratory function stable and patient connected to nasal cannula oxygen Cardiovascular status: blood pressure returned to baseline and stable Postop Assessment: no signs of nausea or vomiting Anesthetic complications: no     Last Vitals:  Vitals:   10/20/16 1100 10/20/16 1115  BP: (!) 125/106 123/79  Pulse: 83 82  Resp: 14 13  Temp:      Last Pain:  Vitals:   10/20/16 1025  TempSrc:   PainSc: 10-Worst pain ever                 Lauryn Lizardi

## 2016-10-20 NOTE — Discharge Instructions (Signed)
Knee Arthroscopy, Care After Refer to this sheet in the next few weeks. These instructions provide you with information about caring for yourself after your procedure. Your health care provider may also give you more specific instructions. Your treatment has been planned according to current medical practices, but problems sometimes occur. Call your health care provider if you have any problems or questions after your procedure. What can I expect after the procedure? After the procedure, it is common to have:  Soreness.  Pain.  Follow these instructions at home: Bathing  Do not take baths, swim, or use a hot tub until your health care provider approves. Incision care  There are many different ways to close and cover an incision, including stitches, skin glue, and adhesive strips. Follow your health care providers instructions about: ? Incision care. ? Bandage (dressing) changes and removal. ? Incision closure removal.  Check your incision area every day for signs of infection. Watch for: ? Redness, swelling, or pain. ? Fluid, blood, or pus. Activity  Avoid strenuous activities for as long as directed by your health care provider.  Return to your normal activities as directed by your health care provider. Ask your health care provider what activities are safe for you.  Perform range-of-motion exercises only as directed by your health care provider.  Do not lift anything that is heavier than 10 lb (4.5 kg).  Do not drive or operate heavy machinery while taking pain medicine.  If you were given crutches, use them as directed by your health care provider. Managing pain, stiffness, and swelling  If directed, apply ice to the injured area: ? Put ice in a plastic bag. ? Place a towel between your skin and the bag. ? Leave the ice on for 20 minutes, 2-3 times per day.  Raise the injured area above the level of your heart while you are sitting or lying down as directed by your  health care provider. General instructions  Keep all follow-up visits as directed by your health care provider. This is important.  Take medicines only as directed by your health care provider.  Do not use any tobacco products, including cigarettes, chewing tobacco, or electronic cigarettes. If you need help quitting, ask your health care provider.  If you were given compression stockings, wear them as directed by your health care provider. These stockings help prevent blood clots and reduce swelling in your legs. Contact a health care provider if:  You have severe pain with any movement of your knee.  You notice a bad smell coming from the incision or dressing.  You have redness, swelling, or pain at the site of your incision.  You have fluid, blood, or pus coming from your incision. Get help right away if:  You develop a rash.  You have a fever.  You have difficulty breathing or have shortness of breath.  You develop pain in your calves or in the back of your knee.  You develop chest pain.  You develop numbness or tingling in your leg or foot. This information is not intended to replace advice given to you by your health care provider. Make sure you discuss any questions you have with your health care provider. Document Released: 10/29/2004 Document Revised: 09/11/2015 Document Reviewed: 04/07/2014 Elsevier Interactive Patient Education  2017 [place] Anesthesia, Adult, Care After These instructions provide you with information about caring for yourself after your procedure. Your health care provider may also give you more specific instructions. Your treatment has been  planned according to current medical practices, but problems sometimes occur. Call your health care provider if you have any problems or questions after your procedure. What can I expect after the procedure? After the procedure, it is common to have:  Vomiting.  A sore throat.  Mental  slowness.  It is common to feel:  Nauseous.  Cold or shivery.  Sleepy.  Tired.  Sore or achy, even in parts of your body where you did not have surgery.  Follow these instructions at home: For at least 24 hours after the procedure:  Do not: ? Participate in activities where you could fall or become injured. ? Drive. ? Use heavy machinery. ? Drink alcohol. ? Take sleeping pills or medicines that cause drowsiness. ? Make important decisions or sign legal documents. ? Take care of children on your own.  Rest. Eating and drinking  If you vomit, drink water, juice, or soup when you can drink without vomiting.  Drink enough fluid to keep your urine clear or pale yellow.  Make sure you have little or no nausea before eating solid foods.  Follow the diet recommended by your health care provider. General instructions  Have a responsible adult stay with you until you are awake and alert.  Return to your normal activities as told by your health care provider. Ask your health care provider what activities are safe for you.  Take over-the-counter and prescription medicines only as told by your health care provider.  If you smoke, do not smoke without supervision.  Keep all follow-up visits as told by your health care provider. This is important. Contact a health care provider if:  You continue to have nausea or vomiting at home, and medicines are not helpful.  You cannot drink fluids or start eating again.  You cannot urinate after 8-12 hours.  You develop a skin rash.  You have fever.  You have increasing redness at the site of your procedure. Get help right away if:  You have difficulty breathing.  You have chest pain.  You have unexpected bleeding.  You feel that you are having a life-threatening or urgent problem. This information is not intended to replace advice given to you by your health care provider. Make sure you discuss any questions you have with  your health care provider. Document Released: 07/18/2000 Document Revised: 09/14/2015 Document Reviewed: 03/26/2015 Elsevier Interactive Patient Education  [employer].

## 2016-10-20 NOTE — Op Note (Signed)
Operative report   9:14 AM 10/20/2016  Preop diagnosis primary osteoarthritis and chondral fracture medial femoral condyle left knee   Postop diagnosis same   Procedure arthroscopy and microfracture medial femoral condyle left knee   Surgeon Aline Brochure   No assistants   Gen. anesthesia   Knee arthroscopy dictation  The patient was identified in the preoperative holding area using 2 approved identification mechanisms. The chart was reviewed and updated. The surgical site was confirmed as the left knee and marked with an indelible marker.  The patient was taken to the operating room for anesthesia. After successful  general anesthesia, 2 g Ancef was used as IV antibiotics.  The patient was placed in the supine position with the (left) the operative extremity in an arthroscopic leg holder and the opposite extremity in a padded leg holder.  The timeout was executed.  A lateral portal was established with an 11 blade and the scope was introduced into the joint. A diagnostic arthroscopy was performed in circumferential manner examining the entire knee joint. A medial portal was established and the diagnostic arthroscopy was repeated using a probe to palpate intra-articular structures as they were encountered.   The operative findings are   Medial chondral lesion 6 x 6 partial and full-thickness medial femoral condyle normal medial meniscus Lateral normal Patellofemoral mild chondromalacia central [place] normal anterior cruciate ligament PCL  The chondral lesion was debrided with a shaver and then a 2.4 drill bit was used to create several microfractures in the bone  A shaver was then used to remove any residual cartilage   The arthroscopic pump was placed on the wash mode and any excess debris was removed from the joint using suction.  60 cc of Marcaine with epinephrine was injected through the arthroscope.  The portals were closed with 3-0 nylon suture.  A sterile bandage,  Ace wrap and Cryo/Cuff was placed and the Cryo/Cuff was activated. The patient was taken to the recovery room in stable condition.

## 2016-10-20 NOTE — Anesthesia Procedure Notes (Signed)
Procedure Name: LMA Insertion Date/Time: 10/20/2016 8:18 AM Performed by: Gilmer Mor R Pre-anesthesia Checklist: Patient identified, Emergency Drugs available, Suction available and Patient being monitored Patient Re-evaluated:Patient Re-evaluated prior to inductionOxygen Delivery Method: Circle system utilized Preoxygenation: Pre-oxygenation with 100% oxygen Intubation Type: IV induction Ventilation: Mask ventilation without difficulty LMA: LMA inserted LMA Size: 4.0 Number of attempts: 1 Placement Confirmation: positive ETCO2 and breath sounds checked- equal and bilateral Dental Injury: Teeth and Oropharynx as per pre-operative assessment

## 2016-10-20 NOTE — Anesthesia Preprocedure Evaluation (Addendum)
Anesthesia Evaluation  Patient identified by MRN, date of birth, ID band  History of Anesthesia Complications (+) PONV  Airway Mallampati: III  TM Distance: >3 FB Neck ROM: Full    Dental  (+) Teeth Intact   Pulmonary Current Smoker,           Cardiovascular  Rhythm:Regular Rate:Normal     Neuro/Psych  Neuromuscular disease    GI/Hepatic Multiple GI related issues, (diverticulitis, n/v, abdominal pain)   Endo/Other  diabetes  Renal/GU      Musculoskeletal   Abdominal (+) + obese,   Peds  Hematology   Anesthesia Other Findings   Reproductive/Obstetrics                            Anesthesia Physical Anesthesia Plan  ASA: III  Anesthesia Plan: General   Post-op Pain Management:    Induction: Intravenous  PONV Risk Score and Plan: Ondansetron, Propofol and Dexamethasone  Airway Management Planned: LMA  Additional Equipment:   Intra-op Plan:   Post-operative Plan: Extubation in OR  Informed Consent: I have reviewed the patients History and Physical, chart, labs and discussed the procedure including the risks, benefits and alternatives for the proposed anesthesia with the patient or authorized representative who has indicated his/her understanding and acceptance.     Plan Discussed with: CRNA  Anesthesia Plan Comments:         Anesthesia Quick Evaluation

## 2016-10-20 NOTE — Brief Op Note (Signed)
10/20/2016  9:12 AM  PATIENT:  [name]  45 y.o. female  PRE-OPERATIVE DIAGNOSIS:  PRIMARY OSTEOARTHRITIS AND CHONDRAL FRACTURE LEFT KNEE  POST-OPERATIVE DIAGNOSIS:  PRIMARY OSTEOARTHRITIS AND CHONDRAL FRACTURE LEFT KNEE  PROCEDURE:  Procedure(s): LEFT KNEE ARTHROSCOPY WITH MICROFRACTURE medial femoral condyle-[id number]  SURGEON:  Surgeon(s) and Role:    * Carole Civil, MD - Primary  PHYSICIAN ASSISTANT:   ASSISTANTS: none   ANESTHESIA:   general  EBL:  Total I/O In: 1000 [I.V.:1000] Out: 5 [Blood:5]  BLOOD ADMINISTERED:none  DRAINS: none   LOCAL MEDICATIONS USED:  MARCAINE     SPECIMEN:  No Specimen  DISPOSITION OF SPECIMEN:  N/A  COUNTS:  YES  TOURNIQUET:    DICTATION: .Dragon Dictation  PLAN OF CARE: Discharge to home after PACU  PATIENT DISPOSITION:  PACU - hemodynamically stable.   Delay start of Pharmacological VTE agent (>24hrs) due to surgical blood loss or risk of bleeding: not applicable  [id number]

## 2016-10-20 NOTE — Interval H&P Note (Signed)
History and Physical Interval Note:  10/20/2016 7:41 AM  [name]  has presented today for surgery, with the diagnosis of PRIMARY OSTEOARTHRITIS AND CHONDRAL FRACTURE LEFT KNEE  The various methods of treatment have been discussed with the patient and family. After consideration of risks, benefits and other options for treatment, the patient has consented to  Procedure(s): KNEE ARTHROSCOPY WITH DRILLING/MICROFRACTURE (Left) as a surgical intervention .  The patient's history has been reviewed, patient examined, no change in status, stable for surgery.  I have reviewed the patient's chart and labs.  Questions were answered to the patient's satisfaction.     Arther Abbott

## 2016-10-20 NOTE — Transfer of Care (Signed)
Immediate Anesthesia Transfer of Care Note  Patient: [name]  Procedure(s) Performed: Procedure(s): LEFT KNEE ARTHROSCOPY WITH MICROFRACTURE (Left)  Patient Location: PACU  Anesthesia Type:General  Level of Consciousness: awake, alert , oriented and patient cooperative  Airway & Oxygen Therapy: Patient Spontanous Breathing and Patient connected to face mask oxygen  Post-op Assessment: Report given to RN, Post -op Vital signs reviewed and stable and Patient moving all extremities X 4  Post vital signs: Reviewed and stable  Last Vitals:  Vitals:   10/20/16 0750 10/20/16 0755  BP: (!) 143/81 (!) 152/86  Pulse:    Resp: (!) 23 14  Temp:      Last Pain:  Vitals:   10/20/16 0735  TempSrc: Oral         Complications: No apparent anesthesia complications

## 2016-10-21 ENCOUNTER — Encounter (HOSPITAL_COMMUNITY): Payer: Self-pay | Admitting: Orthopedic Surgery

## 2016-10-24 ENCOUNTER — Encounter: Payer: Self-pay | Admitting: Orthopedic Surgery

## 2016-10-24 ENCOUNTER — Ambulatory Visit (INDEPENDENT_AMBULATORY_CARE_PROVIDER_SITE_OTHER): Payer: PRIVATE HEALTH INSURANCE | Admitting: Orthopedic Surgery

## 2016-10-24 DIAGNOSIS — Z9889 Other specified postprocedural states: Secondary | ICD-10-CM

## 2016-10-24 DIAGNOSIS — Z4889 Encounter for other specified surgical aftercare: Secondary | ICD-10-CM

## 2016-10-24 MED ORDER — PROMETHAZINE HCL 12.5 MG PO TABS: 12.5000 mg | ORAL_TABLET | ORAL | 0 refills | Status: DC | PRN

## 2016-10-24 MED ORDER — HYDROMORPHONE HCL 2 MG PO TABS: 2.0000 mg | ORAL_TABLET | ORAL | 0 refills | Status: DC | PRN

## 2016-10-24 NOTE — Patient Instructions (Signed)
Wear brace when you walk   Do the exercises as noted  Meds ordered this encounter  Medications  . HYDROmorphone (DILAUDID) 2 MG tablet    Sig: Take 1 tablet (2 mg total) by mouth every 4 (four) hours as needed for severe pain.    Dispense:  30 tablet    Refill:  0  . promethazine (PHENERGAN) 12.5 MG tablet    Sig: Take 1 tablet (12.5 mg total) by mouth every 4 (four) hours as needed for nausea or vomiting.    Dispense:  30 tablet    Refill:  0

## 2016-10-24 NOTE — Progress Notes (Signed)
Postop visit #1  Chief Complaint  Patient presents with  . Follow-up    Post op #1, left knee, SAK. DOS 10-20-16.   Preop diagnosis primary osteoarthritis and chondral fracture medial femoral condyle left knee    Postop diagnosis same    Procedure arthroscopy and microfracture medial femoral condyle left knee    Surgeon Aline Brochure    No assistants    Gen. anesthesia    Knee arthroscopy dictation  Operative findings Medial chondral lesion 6 x 6 partial and full-thickness medial femoral condyle normal medial meniscus Lateral normal Patellofemoral mild chondromalacia [place] normal anterior cruciate ligament PCL   The chondral lesion was debrided with a shaver and then a 2.4 drill bit was used to create several microfractures in the bone  Status post microfracture medial femoral condyle  Pain is controlled Dilaudid and Phenergan patient has hydrocodone allergy  Mild knee swelling less than expected for the microfracture  We took her sutures out I refilled her medicine I gave her an exercise program   Follow-up July 20  Weightbearing status as tolerated  Encounter Diagnoses  Name Primary?  Marland Kitchen Aftercare following surgery Yes  . S/P arthroscopy of left knee

## 2016-11-01 ENCOUNTER — Telehealth: Payer: Self-pay | Admitting: Orthopedic Surgery

## 2016-11-01 DIAGNOSIS — Z4889 Encounter for other specified surgical aftercare: Secondary | ICD-10-CM

## 2016-11-01 DIAGNOSIS — Z9889 Other specified postprocedural states: Secondary | ICD-10-CM

## 2016-11-01 MED ORDER — HYDROMORPHONE HCL 2 MG PO TABS: 2.0000 mg | ORAL_TABLET | ORAL | 0 refills | Status: DC | PRN

## 2016-11-01 MED ORDER — PROMETHAZINE HCL 12.5 MG PO TABS: 12.5000 mg | ORAL_TABLET | ORAL | 0 refills | Status: DC | PRN

## 2016-11-01 NOTE — Telephone Encounter (Signed)
Patient called to request refill on 2 medications (aware Dr Aline Brochure is out of office and that Dr Luna Glasgow is reviewing refill requests) HYDROmorphone (DILAUDID) 2 MG tablet 30 tablet  And  promethazine (PHENERGAN) 12.5 MG tablet 30 tablet

## 2016-11-11 ENCOUNTER — Encounter: Payer: Self-pay | Admitting: Orthopedic Surgery

## 2016-11-11 ENCOUNTER — Ambulatory Visit (INDEPENDENT_AMBULATORY_CARE_PROVIDER_SITE_OTHER): Payer: No Typology Code available for payment source | Admitting: Orthopedic Surgery

## 2016-11-11 DIAGNOSIS — Z4889 Encounter for other specified surgical aftercare: Secondary | ICD-10-CM

## 2016-11-11 DIAGNOSIS — Z9889 Other specified postprocedural states: Secondary | ICD-10-CM

## 2016-11-11 MED ORDER — HYDROMORPHONE HCL 2 MG PO TABS: 2.0000 mg | ORAL_TABLET | Freq: Four times a day (QID) | ORAL | 0 refills | Status: DC | PRN

## 2016-11-11 NOTE — Patient Instructions (Signed)
Change OOW to aug 13 th   Work especially under extension but continue to work on your flexion, go back to 2 crutches full weightbearing in brace follow-up in 3 weeks

## 2016-11-11 NOTE — Progress Notes (Signed)
Do not charge another $25 for the new FMLA which should read out of work until August 13

## 2016-11-11 NOTE — Progress Notes (Signed)
Postop visit status post microfracture left knee  Chief Complaint  Patient presents with  . Routine Post Op    knee arthroscopy 10/20/16    This is postoperative week #3//day 22, Microfracture medial femoral condyle  She complains of pain and swelling she's using 1 crutch but I told her to go back to 2 to enhance her gait  She's developing flexion contracture and is advised to especially work on her extension.  Continue bracing  Work note will be extended out to August  Return in 3 weeks  Meds ordered this encounter  Medications  . HYDROmorphone (DILAUDID) 2 MG tablet    Sig: Take 1 tablet (2 mg total) by mouth every 6 (six) hours as needed for severe pain.    Dispense:  28 tablet    Refill:  0

## 2016-12-02 ENCOUNTER — Ambulatory Visit (INDEPENDENT_AMBULATORY_CARE_PROVIDER_SITE_OTHER): Payer: No Typology Code available for payment source | Admitting: Orthopedic Surgery

## 2016-12-02 ENCOUNTER — Encounter: Payer: Self-pay | Admitting: Orthopedic Surgery

## 2016-12-02 DIAGNOSIS — Z9889 Other specified postprocedural states: Secondary | ICD-10-CM

## 2016-12-02 DIAGNOSIS — M25462 Effusion, left knee: Secondary | ICD-10-CM

## 2016-12-02 DIAGNOSIS — Z4889 Encounter for other specified surgical aftercare: Secondary | ICD-10-CM

## 2016-12-02 MED ORDER — HYDROMORPHONE HCL 2 MG PO TABS: 2.0000 mg | ORAL_TABLET | Freq: Four times a day (QID) | ORAL | 0 refills | Status: DC | PRN

## 2016-12-02 MED ORDER — PROMETHAZINE HCL 12.5 MG PO TABS: 12.5000 mg | ORAL_TABLET | ORAL | 0 refills | Status: DC | PRN

## 2016-12-02 NOTE — Patient Instructions (Signed)
Return to work change to August 27

## 2016-12-02 NOTE — Progress Notes (Signed)
Routine postop visit status post knee arthroscopy microfracture medial femoral condyle 10/20/2016 6 weeks ago.  The patient still having trouble ambulating with the brace she says the brace makes her knee swelled more  She is on Dilaudid for pain due to hydrocodone and Tylenol ibuprofen allergy. She has been able to improve her range of motion to full flexion although it gets tight and she has 5 loss of extension  She has a small effusion no signs of infection  I aspirated 8 mL of fluid which was amber colored from the knee and injected 40 mg of Depo-Medrol  She'll return to work in 2 weeks if she can she still having quite a bit of difficulty. I advised her that this usually takes 12 weeks to recover from  Procedure note injection and aspiration left knee joint  Verbal consent was obtained to aspirate and inject the left knee joint   Timeout was completed to confirm the site of aspiration and injection  An 18-gauge needle was used to aspirate the left knee joint from a suprapatellar lateral approach.  The medications used were 40 mg of Depo-Medrol and 1% lidocaine 3 cc  Anesthesia was provided by ethyl chloride and the skin was prepped with alcohol.  After cleaning the skin with alcohol an 18-gauge needle was used to aspirate the right knee joint.  We obtained amber colored 8 cc of fluid  We followed this by injection of 40 mg of Depo-Medrol and 3 cc 1% lidocaine.  There were no complications. A sterile bandage was applied.

## 2017-01-02 ENCOUNTER — Ambulatory Visit: Payer: No Typology Code available for payment source | Admitting: Orthopedic Surgery

## 2017-01-06 ENCOUNTER — Ambulatory Visit: Payer: No Typology Code available for payment source | Admitting: Orthopedic Surgery

## 2017-01-10 ENCOUNTER — Ambulatory Visit (INDEPENDENT_AMBULATORY_CARE_PROVIDER_SITE_OTHER): Payer: No Typology Code available for payment source | Admitting: Orthopedic Surgery

## 2017-01-10 ENCOUNTER — Encounter: Payer: Self-pay | Admitting: Orthopedic Surgery

## 2017-01-10 DIAGNOSIS — Z9889 Other specified postprocedural states: Secondary | ICD-10-CM

## 2017-01-10 DIAGNOSIS — Z4889 Encounter for other specified surgical aftercare: Secondary | ICD-10-CM

## 2017-01-10 MED ORDER — HYDROMORPHONE HCL 2 MG PO TABS: 2.0000 mg | ORAL_TABLET | Freq: Four times a day (QID) | ORAL | 0 refills | Status: DC | PRN

## 2017-01-10 NOTE — Patient Instructions (Signed)
Steps to Quit Smoking Smoking tobacco can be bad for your health. It can also affect almost every organ in your body. Smoking puts you and people around you at risk for many serious Maryfer Tauzin-lasting (chronic) diseases. Quitting smoking is hard, but it is one of the best things that you can do for your health. It is never too late to quit. What are the benefits of quitting smoking? When you quit smoking, you lower your risk for getting serious diseases and conditions. They can include:  Lung cancer or lung disease.  Heart disease.  Stroke.  Heart attack.  Not being able to have children (infertility).  Weak bones (osteoporosis) and broken bones (fractures).  If you have coughing, wheezing, and shortness of breath, those symptoms may get better when you quit. You may also get sick less often. If you are pregnant, quitting smoking can help to lower your chances of having a baby of low birth weight. What can I do to help me quit smoking? Talk with your doctor about what can help you quit smoking. Some things you can do (strategies) include:  Quitting smoking totally, instead of slowly cutting back how much you smoke over a period of time.  Going to in-person counseling. You are more likely to quit if you go to many counseling sessions.  Using resources and support systems, such as: ? Online chats with a counselor. ? Phone quitlines. ? Printed self-help materials. ? Support groups or group counseling. ? Text messaging programs. ? Mobile phone apps or applications.  Taking medicines. Some of these medicines may have nicotine in them. If you are pregnant or breastfeeding, do not take any medicines to quit smoking unless your doctor says it is okay. Talk with your doctor about counseling or other things that can help you.  Talk with your doctor about using more than one strategy at the same time, such as taking medicines while you are also going to in-person counseling. This can help make  quitting easier. What things can I do to make it easier to quit? Quitting smoking might feel very hard at first, but there is a lot that you can do to make it easier. Take these steps:  Talk to your family and friends. Ask them to support and encourage you.  Call phone quitlines, reach out to support groups, or work with a counselor.  Ask people who smoke to not smoke around you.  Avoid places that make you want (trigger) to smoke, such as: ? Bars. ? Parties. ? Smoke-break areas at work.  Spend time with people who do not smoke.  Lower the stress in your life. Stress can make you want to smoke. Try these things to help your stress: ? Getting regular exercise. ? Deep-breathing exercises. ? Yoga. ? Meditating. ? Doing a body scan. To do this, close your eyes, focus on one area of your body at a time from head to toe, and notice which parts of your body are tense. Try to relax the muscles in those areas.  Download or buy apps on your mobile phone or tablet that can help you stick to your quit plan. There are many free apps, such as QuitGuide from the CDC (Centers for Disease Control and Prevention). You can find more support from smokefree.gov and other websites.  This information is not intended to replace advice given to you by your health care provider. Make sure you discuss any questions you have with your health care provider. Document Released: 02/05/2009 Document   Revised: 12/08/2015 Document Reviewed: 08/26/2014 Elsevier Interactive Patient Education  2018 Elsevier Inc.  

## 2017-01-10 NOTE — Progress Notes (Signed)
Patient ID: [name], female   DOB: 1971/04/01, 46 y.o.   MRN: [id number]  POSTOP VISIT   Chief Complaint  Patient presents with  . Follow-up    Post op knee arthroscopy DOS 10/20/16    46 year old female postop visit  Surgery date June 28 this is postop day #82  She did fairly well until she went back to work. We knew that this was early. One day on the job for 12 hours standing in her knee swelled up like a balloon.  However, she is switching jobs she will be up on her feet as much and the swelling did go down. We tried her without a hinged knee brace that did not work very well some mechanical back to the bracing  Her knee is not swollen today she has regained her full range of motion she has good quadriceps strength  She'll follow-up in 2 months    Meds ordered this encounter  Medications  . HYDROmorphone (DILAUDID) 2 MG tablet    Sig: Take 1 tablet (2 mg total) by mouth every 6 (six) hours as needed for severe pain.    Dispense:  28 tablet    Refill:  0     Encounter Diagnoses  Name Primary?  Marland Kitchen Aftercare following surgery   . S/P arthroscopy of left knee       3:24 PM Arther Abbott, MD 01/10/2017

## 2017-03-15 ENCOUNTER — Ambulatory Visit: Payer: No Typology Code available for payment source | Admitting: Orthopedic Surgery

## 2017-03-28 DIAGNOSIS — Z9889 Other specified postprocedural states: Secondary | ICD-10-CM | POA: Insufficient documentation

## 2017-03-29 ENCOUNTER — Ambulatory Visit (INDEPENDENT_AMBULATORY_CARE_PROVIDER_SITE_OTHER): Payer: Managed Care, Other (non HMO)

## 2017-03-29 ENCOUNTER — Encounter: Payer: Self-pay | Admitting: Orthopedic Surgery

## 2017-03-29 ENCOUNTER — Ambulatory Visit (INDEPENDENT_AMBULATORY_CARE_PROVIDER_SITE_OTHER): Payer: Managed Care, Other (non HMO) | Admitting: Orthopedic Surgery

## 2017-03-29 VITALS — BP 142/95 | HR 81 | Ht 66.0 in | Wt 214.0 lb

## 2017-03-29 DIAGNOSIS — Z4889 Encounter for other specified surgical aftercare: Secondary | ICD-10-CM

## 2017-03-29 DIAGNOSIS — Z9889 Other specified postprocedural states: Secondary | ICD-10-CM

## 2017-03-29 DIAGNOSIS — M1712 Unilateral primary osteoarthritis, left knee: Secondary | ICD-10-CM

## 2017-03-29 DIAGNOSIS — M238X2 Other internal derangements of left knee: Secondary | ICD-10-CM

## 2017-03-29 MED ORDER — HYDROMORPHONE HCL 2 MG PO TABS: 2.0000 mg | ORAL_TABLET | Freq: Four times a day (QID) | ORAL | 0 refills | Status: DC | PRN

## 2017-03-29 NOTE — Progress Notes (Addendum)
Progress Note   Patient ID: [name], female   DOB: 04/16/1971, 46 y.o.   MRN: [id number]  Chief Complaint  Patient presents with  . Knee Pain    left s/p left knee arthroscopy on 10/20/16    HPI 46 year old female status post knee arthroscopy and microfracture of the medial femoral condyle presents with continued pain swelling medial side of the joint  She has required Dilaudid she is allergic to any other opioids to get pain relief.  She says she walks all day and it is swollen by the end of the day and she cannot really see herself continuing to work like this.  So the pain scale was 8-10 out of 10 duration of pain several months now greater than 5 timing occurs with walking sitting context not improved with any measures quality dull aching  Review of Systems  Constitutional: Negative.   Respiratory: Negative.   Cardiovascular: Negative.    Past Surgical History:  Procedure Laterality Date  . ablasion of uterus    . CARPAL TUNNEL RELEASE  11/10/2010   Procedure: CARPAL TUNNEL RELEASE;  Surgeon: Arther Abbott, MD;  Location: [facility];  Service: Orthopedics;  Laterality: Right;  . CHOLECYSTECTOMY     [facility]  . COLON RESECTION N/A 04/08/2015   Procedure: LAPAROSCOPIC HAND ASSISTED PARTIAL COLECTOMY  CONVERTED TO OPEN AT 7616;  Surgeon: Aviva Signs, MD;  Location: [facility];  Service: General;  Laterality: N/A;  . COLONOSCOPY N/A 03/28/2014   Procedure: COLONOSCOPY;  Surgeon: Rogene Houston, MD;  Location: [facility];  Service: Endoscopy;  Laterality: N/A;  730  . KNEE ARTHROSCOPY WITH DRILLING/MICROFRACTURE Left 10/20/2016   Procedure: LEFT KNEE ARTHROSCOPY WITH MICROFRACTURE;  Surgeon: Carole Civil, MD;  Location: [facility];  Service: Orthopedics;  Laterality: Left;  . KNEE SURGERY    . PARTIAL COLECTOMY N/A 04/08/2015   Procedure: PARTIAL COLECTOMY;  Surgeon: Aviva Signs, MD;  Location: [facility];  Service: General;  Laterality: N/A;  . right knee arthroscopy    . TUBAL  LIGATION      Past Medical History:  Diagnosis Date  . Arthritis   . Carpal tunnel syndrome, bilateral   . Diabetes mellitus without complication ([place])    no meds; diet controlled  . Diverticulitis   . Hypertension   . PONV (postoperative nausea and vomiting)   . Sciatica     Current Meds  Medication Sig  . acetaminophen (TYLENOL) 500 MG tablet Take 1,000 mg by mouth every 6 (six) hours as needed for moderate pain.   Marland Kitchen HYDROmorphone (DILAUDID) 2 MG tablet Take 1 tablet (2 mg total) by mouth every 6 (six) hours as needed for severe pain.  Marland Kitchen ibuprofen (ADVIL,MOTRIN) 200 MG tablet Take 800 mg by mouth every 8 (eight) hours as needed for mild pain or moderate pain.  . promethazine (PHENERGAN) 12.5 MG tablet Take 1 tablet (12.5 mg total) by mouth every 4 (four) hours as needed for nausea or vomiting.    Allergies  Allergen Reactions  . Flagyl [Metronidazole] Nausea Only  . Hydrocodone Nausea And Vomiting    Can take need to take with medication for nausea and vomitting     BP (!) 142/95   Pulse 81   Ht 5\' 6"  (1.676 m)   Wt 214 lb (97.1 kg)   BMI 34.54 kg/m   Physical Exam  Constitutional: She is oriented to person, place, and time. She appears well-developed and well-nourished.  Musculoskeletal:  Left knee: She exhibits swelling, effusion and bony tenderness. She exhibits normal range of motion, no ecchymosis, no deformity, no laceration, no erythema, normal alignment, no LCL laxity, normal patellar mobility, normal meniscus and no MCL laxity. Tenderness found. Medial joint line tenderness noted. No lateral joint line, no MCL, no LCL and no patellar tendon tenderness noted.  Neurological: She is alert and oriented to person, place, and time.  Skin: Skin is warm and dry. No rash noted. No erythema. No pallor.  Psychiatric: She has a normal mood and affect. Judgment normal.  Vitals reviewed.  Upper extremities normal alignment no contracture of the joints no instability  or laxity or subluxation of the right or left upper extremity muscle tone is normal in both upper extremity skin normal in both upper extremities  Medical decision-making Encounter Diagnoses  Name Primary?  . S/P left knee arthroscopy 10/20/16 Yes  . 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 Primary osteoarthritis of left knee       Meds ordered this encounter  Medications  . HYDROmorphone (DILAUDID) 2 MG tablet    Sig: Take 1 tablet (2 mg total) by mouth every 6 (six) hours as needed for severe pain.    Dispense:  28 tablet    Refill:  0   After discussing this with her we have decided to proceed with arthroscopy, possible microfracture versus oats procedure and if neither of those are possible or if it does not work then we would proceed with knee replacement surgery  I repeated her x-ray and she has moderate joint space narrowing but by no means bone-on-bone changes of the are present  The procedure has been fully reviewed with the patient; The risks and benefits of surgery have been discussed and explained and understood. Alternative treatment has also been reviewed, questions were encouraged and answered. The postoperative plan is also been reviewed.   Arthroscopy left knee microfracture versus oats procedure Arther Abbott, MD 03/29/2017 9:51 AM

## 2017-03-29 NOTE — Patient Instructions (Signed)
We are going to schedule arthroscopy of the left knee with cartilage transplant versus repeat microfracture    Arthroscopy Arthroscopy is a surgical procedure that involves the use of a small scope that has a camera and surgical instruments on the end (arthroscope). An arthroscope can be used to repair your meniscus injury.  LET [facility] CARE PROVIDER KNOW ABOUT:  Any allergies you have.  All medicines you are taking, including vitamins, herbs, eyedrops, creams, and over-the-counter medicines.  Any recent colds or infections you have had or currently have.  Previous problems you or members of your family have had with the use of anesthetics.  Any blood disorders or blood clotting problems you have.  Previous surgeries you have had.  Medical conditions you have. RISKS AND COMPLICATIONS Generally, this is a safe procedure. However, as with any procedure, problems can occur. Possible problems include:  Damage to nerves or blood vessels.  Excess bleeding.  Blood clots.  Infection. BEFORE THE PROCEDURE  Do not eat or drink for 6-8 hours before the procedure.  Take medicines as directed by your surgeon. Ask your surgeon about changing or stopping your regular medicines.  You may have lab tests the morning of surgery. PROCEDURE  You will be given one of the following:   A medicine that numbs the area (local anesthesia).  A medicine that makes you go to sleep (general anesthesia).  A medicine injected into your spine that numbs your body below the waist (spinal anesthesia). Most often, several small cuts (incisions) are made in the knee. The arthroscope and instruments go into the incisions to repair the damage. The torn portion of the meniscus is removed.   AFTER THE PROCEDURE  You will be taken to the recovery area where your progress will be monitored. When you are awake, stable, and taking fluids without complications, you will be allowed to go home. This is usually  the same day. A torn or stretched ligament (ligament sprain) may take 6-8 weeks to heal.   It takes about the 2-6 WEEKS  to recover.     This information is not intended to replace advice given to you by your health care provider. Make sure you discuss any questions you have with your health care provider. You have decided to proceed with operative arthroscopy of the knee. You have decided not to continue with nonoperative measures such as but not limited to oral medication, weight loss, activity modification, physical therapy, bracing, or injection.  We will perform operative arthroscopy of the knee. Some of the risks associated with arthroscopic surgery of the knee include but are not limited to Bleeding Infection Swelling Stiffness Blood clot Pain  If you're not comfortable with these risks and would like to continue with nonoperative treatment please let Dr. Aline Brochure know prior to your surgery.   Document Released: 04/08/2000 Document Revised: 04/16/2013 Document Reviewed: 09/07/2012 Elsevier Interactive Patient Education 2016 [place] have decided to proceed with operative arthroscopy of the knee. You have decided not to continue with nonoperative measures such as but not limited to oral medication, weight loss, activity modification, physical therapy, bracing, or injection.  We will perform operative arthroscopy of the knee. Some of the risks associated with arthroscopic surgery of the knee include but are not limited to Bleeding Infection Swelling Stiffness Blood clot Pain  If you're not comfortable with these risks and would like to continue with nonoperative treatment please let Dr. Aline Brochure know prior to your surgery.

## 2017-04-10 ENCOUNTER — Telehealth: Payer: Self-pay | Admitting: Orthopedic Surgery

## 2017-04-10 ENCOUNTER — Other Ambulatory Visit: Payer: Self-pay | Admitting: Orthopedic Surgery

## 2017-04-10 NOTE — Telephone Encounter (Signed)
See note from patient, can schedule surgery? She states it is for 04/27/17 but it is not posted yet.   Your note states After discussing this with her we have decided to proceed with arthroscopy, possible microfracture versus oats procedure and if neither of those are possible or if it does not work then we would proceed with knee replacement surgery  Give me a week for auth, since she is [place]

## 2017-04-10 NOTE — Telephone Encounter (Signed)
Patient called asking if her surgery can be moved up as soon as possible. Her surgery is scheduled for 04/27/17 as of now. She states she is hurting so bad.  Please advise.

## 2017-04-10 NOTE — Telephone Encounter (Signed)
Precert for microfracture arthroscopy knee

## 2017-04-11 ENCOUNTER — Other Ambulatory Visit: Payer: Self-pay | Admitting: Orthopedic Surgery

## 2017-04-11 NOTE — Telephone Encounter (Signed)
Have approval on file ok to schedule, dates of approval good 04/20/17 through 07/19/16

## 2017-04-11 NOTE — Telephone Encounter (Signed)
Called pt to advise the first available per Dr Aline Brochure is 04/27/17 I will have to get the auth corrected.

## 2017-04-19 ENCOUNTER — Telehealth: Payer: Self-pay | Admitting: Radiology

## 2017-04-19 NOTE — Patient Instructions (Signed)
[name]  04/19/2017     @PREFPERIOPPHARMACY @   Your procedure is scheduled on  04/27/2017 .  Report to [facility] at  615  A.M.  Call this number if you have problems the morning of surgery:  [phone]   Remember:  Do not eat food or drink liquids after midnight.  Take these medicines the morning of surgery with A SIP OF WATER  Dilaudid, phenergan( if either are needed).   Do not wear jewelry, make-up or nail polish.  Do not wear lotions, powders, or perfumes, or deodorant.  Do not shave 48 hours prior to surgery.  Men may shave face and neck.  Do not bring valuables to the hospital.  [facility] is not responsible for any belongings or valuables.  Contacts, dentures or bridgework may not be worn into surgery.  Leave your suitcase in the car.  After surgery it may be brought to your room.  For patients admitted to the hospital, discharge time will be determined by your treatment team.  Patients discharged the day of surgery will not be allowed to drive home.   Name and phone number of your driver:   family Special instructions:  None  Please read over the following fact sheets that you were given. Anesthesia Post-op Instructions and Care and Recovery After Surgery      Knee Arthroscopy Knee arthroscopy is a surgical procedure that is used to examine the inside of your knee joint and repair any damage. The surgeon puts a small, lighted instrument with a camera on the tip (arthroscope) through a small incision in your knee. The camera sends pictures to a monitor in the operating room. Your surgeon uses those pictures to guide the surgical instruments through other incisions to the area of damage. Knee arthroscopy can be used to treat many types of knee problems. It may be used:  To repair a torn ligament.  To repair or remove damaged tissue.  To remove a fluid-filled sac (cyst) from your knee.  Tell a health care provider about:  Any allergies  you have.  All medicines you are taking, including vitamins, herbs, eye drops, creams, and over-the-counter medicines.  Any problems you or family members have had with anesthetic medicines.  Any blood disorders you have.  Any surgeries you have had.  Any medical conditions you have. What are the risks? Generally, this is a safe procedure. However, problems may occur, including:  Infection.  Bleeding.  Damage to blood vessels, nerves, or structures of your knee.  A blood clot that forms in your leg and travels to your lung.  Failure to relieve symptoms.  What happens before the procedure?  Ask your health care provider about: ? Changing or stopping your regular medicines. This is especially important if you are taking diabetes medicines or blood thinners. ? Taking medicines such as aspirin and ibuprofen. These medicines can thin your blood. Do not take these medicines before your procedure if your health care provider instructs you not to.  Follow your health care provider's instructions about eating or drinking restrictions.  Plan to have someone take you home after the procedure.  If you go home right after the procedure, plan to have someone with you for 24 hours.  Do not drink alcohol unless your health care provider says that you can.  Do not use any tobacco products, including cigarettes, chewing tobacco, or electronic cigarettes unless your health care provider says  that you can. If you need help quitting, ask your health care provider.  You may have a physical exam. What happens during the procedure?  An IV tube will be inserted into one of your veins.  You will be given one or more of the following: ? A medicine that helps you relax (sedative). ? A medicine that numbs the area (local anesthetic). ? A medicine that makes you fall asleep (general anesthetic). ? A medicine that is injected into your spine that numbs the area below and slightly above the  injection site (spinal anesthetic). ? A medicine that is injected into an area of your body that numbs everything below the injection site (regional anesthetic).  A cuff may be placed around your upper leg to slow bleeding during the procedure.  The surgeon will make a small number of incisions around your knee.  Your knee joint will be flushed and filled with a germ-free (sterile) solution.  The arthroscope will be passed through an incision into your knee joint.  More instruments will be passed through other incisions to repair your knee as needed.  The fluid will be removed from your knee.  The incisions will be closed with adhesive strips or stitches (sutures).  A bandage (dressing) will be placed over your knee. The procedure may vary among health care providers and hospitals. What happens after the procedure?  Your blood pressure, heart rate, breathing rate and blood oxygen level will be monitored often until the medicines you were given have worn off.  You may be given medicine for pain.  You may get crutches to help you walk without using your knee to support your body weight.  You may have to wear compression stockings. These stocking help to prevent blood clots and reduce swelling in your legs. This information is not intended to replace advice given to you by your health care provider. Make sure you discuss any questions you have with your health care provider. Document Released: 04/08/2000 Document Revised: 09/17/2015 Document Reviewed: 04/07/2014 Elsevier Interactive Patient Education  2018 [place]. Knee Arthroscopy, Care After Refer to this sheet in the next few weeks. These instructions provide you with information about caring for yourself after your procedure. Your health care provider may also give you more specific instructions. Your treatment has been planned according to current medical practices, but problems sometimes occur. Call your health care provider  if you have any problems or questions after your procedure. What can I expect after the procedure? After the procedure, it is common to have:  Soreness.  Pain.  Follow these instructions at home: Bathing  Do not take baths, swim, or use a hot tub until your health care provider approves. Incision care  There are many different ways to close and cover an incision, including stitches, skin glue, and adhesive strips. Follow your health care provider's instructions about: ? Incision care. ? Bandage (dressing) changes and removal. ? Incision closure removal.  Check your incision area every day for signs of infection. Watch for: ? Redness, swelling, or pain. ? Fluid, blood, or pus. Activity  Avoid strenuous activities for as long as directed by your health care provider.  Return to your normal activities as directed by your health care provider. Ask your health care provider what activities are safe for you.  Perform range-of-motion exercises only as directed by your health care provider.  Do not lift anything that is heavier than 10 lb (4.5 kg).  Do not drive or operate heavy machinery  while taking pain medicine.  If you were given crutches, use them as directed by your health care provider. Managing pain, stiffness, and swelling  If directed, apply ice to the injured area: ? Put ice in a plastic bag. ? Place a towel between your skin and the bag. ? Leave the ice on for 20 minutes, 2-3 times per day.  Raise the injured area above the level of your heart while you are sitting or lying down as directed by your health care provider. General instructions  Keep all follow-up visits as directed by your health care provider. This is important.  Take medicines only as directed by your health care provider.  Do not use any tobacco products, including cigarettes, chewing tobacco, or electronic cigarettes. If you need help quitting, ask your health care provider.  If you were given  compression stockings, wear them as directed by your health care provider. These stockings help prevent blood clots and reduce swelling in your legs. Contact a health care provider if:  You have severe pain with any movement of your knee.  You notice a bad smell coming from the incision or dressing.  You have redness, swelling, or pain at the site of your incision.  You have fluid, blood, or pus coming from your incision. Get help right away if:  You develop a rash.  You have a fever.  You have difficulty breathing or have shortness of breath.  You develop pain in your calves or in the back of your knee.  You develop chest pain.  You develop numbness or tingling in your leg or foot. This information is not intended to replace advice given to you by your health care provider. Make sure you discuss any questions you have with your health care provider. Document Released: 10/29/2004 Document Revised: 09/11/2015 Document Reviewed: 04/07/2014 Elsevier Interactive Patient Education  2018 [place] Anesthesia, Adult General anesthesia is the use of medicines to make a person "go to sleep" (be unconscious) for a medical procedure. General anesthesia is often recommended when a procedure:  Is long.  Requires you to be still or in an unusual position.  Is major and can cause you to lose blood.  Is impossible to do without general anesthesia.  The medicines used for general anesthesia are called general anesthetics. In addition to making you sleep, the medicines:  Prevent pain.  Control your blood pressure.  Relax your muscles.  Tell a health care provider about:  Any allergies you have.  All medicines you are taking, including vitamins, herbs, eye drops, creams, and over-the-counter medicines.  Any problems you or family members have had with anesthetic medicines.  Types of anesthetics you have had in the past.  Any bleeding disorders you have.  Any  surgeries you have had.  Any medical conditions you have.  Any history of heart or lung conditions, such as heart failure, sleep apnea, or chronic obstructive pulmonary disease (COPD).  Whether you are pregnant or may be pregnant.  Whether you use tobacco, alcohol, marijuana, or street drugs.  Any history of Armed forces logistics/support/administrative officer.  Any history of depression or anxiety. What are the risks? Generally, this is a safe procedure. However, problems may occur, including:  Allergic reaction to anesthetics.  Lung and heart problems.  Inhaling food or liquids from your stomach into your lungs (aspiration).  Injury to nerves.  Waking up during your procedure and being unable to move (rare).  Extreme agitation or a state of mental confusion (delirium)  when you wake up from the anesthetic.  Air in the bloodstream, which can lead to stroke.  These problems are more likely to develop if you are having a major surgery or if you have an advanced medical condition. You can prevent some of these complications by answering all of your health care provider's questions thoroughly and by following all pre-procedure instructions. General anesthesia can cause side effects, including:  Nausea or vomiting  A sore throat from the breathing tube.  Feeling cold or shivery.  Feeling tired, washed out, or achy.  Sleepiness or drowsiness.  Confusion or agitation.  What happens before the procedure? Staying hydrated Follow instructions from your health care provider about hydration, which may include:  Up to 2 hours before the procedure - you may continue to drink clear liquids, such as water, clear fruit juice, black coffee, and plain tea.  Eating and drinking restrictions Follow instructions from your health care provider about eating and drinking, which may include:  8 hours before the procedure - stop eating heavy meals or foods such as meat, fried foods, or fatty foods.  6 hours before the  procedure - stop eating light meals or foods, such as toast or cereal.  6 hours before the procedure - stop drinking milk or drinks that contain milk.  2 hours before the procedure - stop drinking clear liquids.  Medicines  Ask your health care provider about: ? Changing or stopping your regular medicines. This is especially important if you are taking diabetes medicines or blood thinners. ? Taking medicines such as aspirin and ibuprofen. These medicines can thin your blood. Do not take these medicines before your procedure if your health care provider instructs you not to. ? Taking new dietary supplements or medicines. Do not take these during the week before your procedure unless your health care provider approves them.  If you are told to take a medicine or to continue taking a medicine on the day of the procedure, take the medicine with sips of water. General instructions   Ask if you will be going home the same day, the following day, or after a longer hospital stay. ? Plan to have someone take you home. ? Plan to have someone stay with you for the first 24 hours after you leave the hospital or clinic.  For 3-6 weeks before the procedure, try not to use any tobacco products, such as cigarettes, chewing tobacco, and e-cigarettes.  You may brush your teeth on the morning of the procedure, but make sure to spit out the toothpaste. What happens during the procedure?  You will be given anesthetics through a mask and through an IV tube in one of your veins.  You may receive medicine to help you relax (sedative).  As soon as you are asleep, a breathing tube may be used to help you breathe.  An anesthesia specialist will stay with you throughout the procedure. He or she will help keep you comfortable and safe by continuing to give you medicines and adjusting the amount of medicine that you get. He or she will also watch your blood pressure, pulse, and oxygen levels to make sure that the  anesthetics do not cause any problems.  If a breathing tube was used to help you breathe, it will be removed before you wake up. The procedure may vary among health care providers and hospitals. What happens after the procedure?  You will wake up, often slowly, after the procedure is complete, usually in a recovery  area.  Your blood pressure, heart rate, breathing rate, and blood oxygen level will be monitored until the medicines you were given have worn off.  You may be given medicine to help you calm down if you feel anxious or agitated.  If you will be going home the same day, your health care provider may check to make sure you can stand, drink, and urinate.  Your health care providers will treat your pain and side effects before you go home.  Do not drive for 24 hours if you received a sedative.  You may: ? Feel nauseous and vomit. ? Have a sore throat. ? Have mental slowness. ? Feel cold or shivery. ? Feel sleepy. ? Feel tired. ? Feel sore or achy, even in parts of your body where you did not have surgery. This information is not intended to replace advice given to you by your health care provider. Make sure you discuss any questions you have with your health care provider. Document Released: 07/19/2007 Document Revised: 09/22/2015 Document Reviewed: 03/26/2015 Elsevier Interactive Patient Education  2018 [place] Anesthesia, Adult, Care After These instructions provide you with information about caring for yourself after your procedure. Your health care provider may also give you more specific instructions. Your treatment has been planned according to current medical practices, but problems sometimes occur. Call your health care provider if you have any problems or questions after your procedure. What can I expect after the procedure? After the procedure, it is common to have:  Vomiting.  A sore throat.  Mental slowness.  It is common to  feel:  Nauseous.  Cold or shivery.  Sleepy.  Tired.  Sore or achy, even in parts of your body where you did not have surgery.  Follow these instructions at home: For at least 24 hours after the procedure:  Do not: ? Participate in activities where you could fall or become injured. ? Drive. ? Use heavy machinery. ? Drink alcohol. ? Take sleeping pills or medicines that cause drowsiness. ? Make important decisions or sign legal documents. ? Take care of children on your own.  Rest. Eating and drinking  If you vomit, drink water, juice, or soup when you can drink without vomiting.  Drink enough fluid to keep your urine clear or pale yellow.  Make sure you have little or no nausea before eating solid foods.  Follow the diet recommended by your health care provider. General instructions  Have a responsible adult stay with you until you are awake and alert.  Return to your normal activities as told by your health care provider. Ask your health care provider what activities are safe for you.  Take over-the-counter and prescription medicines only as told by your health care provider.  If you smoke, do not smoke without supervision.  Keep all follow-up visits as told by your health care provider. This is important. Contact a health care provider if:  You continue to have nausea or vomiting at home, and medicines are not helpful.  You cannot drink fluids or start eating again.  You cannot urinate after 8-12 hours.  You develop a skin rash.  You have fever.  You have increasing redness at the site of your procedure. Get help right away if:  You have difficulty breathing.  You have chest pain.  You have unexpected bleeding.  You feel that you are having a life-threatening or urgent problem. This information is not intended to replace advice given to you by your health  care provider. Make sure you discuss any questions you have with your health care  provider. Document Released: 07/18/2000 Document Revised: 09/14/2015 Document Reviewed: 03/26/2015 Elsevier Interactive Patient Education  [employer].

## 2017-04-19 NOTE — Telephone Encounter (Signed)
I called [name] today to try to change the dates of the Surgery auth for patient, when she wanted to move case forward, the dates given by [name] was 04/13/17-04/20/17 but surgery was not RS for her, and she is still posted on Jan 3rd  I have asked to have the original dates of the authorization restored, the dates were Dec 27th through March 27th. The dates of the Authorization have been changed to Jan 3rd to April 3rd. Ref number for the approval is [id number]  Ref number for the call today is Vilma Prader 04/19/17 9:06am

## 2017-04-21 ENCOUNTER — Other Ambulatory Visit: Payer: Self-pay

## 2017-04-21 ENCOUNTER — Other Ambulatory Visit (HOSPITAL_COMMUNITY): Payer: Managed Care, Other (non HMO)

## 2017-04-21 ENCOUNTER — Encounter (HOSPITAL_COMMUNITY)
Admission: RE | Admit: 2017-04-21 | Discharge: 2017-04-21 | Disposition: A | Discharge status: Home / Self Care | Payer: Managed Care, Other (non HMO) | Source: Ambulatory Visit | Attending: Orthopedic Surgery | Admitting: Orthopedic Surgery

## 2017-04-21 ENCOUNTER — Encounter (HOSPITAL_COMMUNITY): Payer: Self-pay

## 2017-04-21 DIAGNOSIS — Z01812 Encounter for preprocedural laboratory examination: Secondary | ICD-10-CM | POA: Insufficient documentation

## 2017-04-21 DIAGNOSIS — Z01810 Encounter for preprocedural cardiovascular examination: Secondary | ICD-10-CM | POA: Insufficient documentation

## 2017-04-21 HISTORY — DX: Essential (primary) hypertension: I10

## 2017-04-21 HISTORY — DX: Unspecified osteoarthritis, unspecified site: M19.90

## 2017-04-21 LAB — CBC WITH DIFFERENTIAL/PLATELET
Basophils Absolute: 0.1 10*3/uL (ref 0.0–0.1)
Basophils Relative: 1 %
Eosinophils Absolute: 0.3 10*3/uL (ref 0.0–0.7)
Eosinophils Relative: 2 %
HEMATOCRIT: 47 % — AB (ref 36.0–46.0)
Hemoglobin: 15.7 g/dL — ABNORMAL HIGH (ref 12.0–15.0)
LYMPHS ABS: 4.5 10*3/uL — AB (ref 0.7–4.0)
LYMPHS PCT: 37 %
MCH: 29.5 pg (ref 26.0–34.0)
MCHC: 33.4 g/dL (ref 30.0–36.0)
MCV: 88.2 fL (ref 78.0–100.0)
MONO ABS: 0.7 10*3/uL (ref 0.1–1.0)
MONOS PCT: 6 %
NEUTROS ABS: 6.6 10*3/uL (ref 1.7–7.7)
Neutrophils Relative %: 54 %
Platelets: 330 10*3/uL (ref 150–400)
RBC: 5.33 MIL/uL — ABNORMAL HIGH (ref 3.87–5.11)
RDW: 13.6 % (ref 11.5–15.5)
WBC: 12.1 10*3/uL — ABNORMAL HIGH (ref 4.0–10.5)

## 2017-04-21 LAB — HEMOGLOBIN A1C
Hgb A1c MFr Bld: 10.1 % — ABNORMAL HIGH (ref 4.8–5.6)
Mean Plasma Glucose: 243.17 mg/dL

## 2017-04-21 LAB — BASIC METABOLIC PANEL
ANION GAP: 13 (ref 5–15)
BUN: 17 mg/dL (ref 6–20)
CALCIUM: 9.8 mg/dL (ref 8.9–10.3)
CO2: 21 mmol/L — AB (ref 22–32)
CREATININE: 0.5 mg/dL (ref 0.44–1.00)
Chloride: 102 mmol/L (ref 101–111)
GFR calc Af Amer: >60 mL/min (ref >60)
GFR calc non Af Amer: >60 mL/min (ref >60)
GLUCOSE: 205 mg/dL — AB (ref 65–99)
Potassium: 3.9 mmol/L (ref 3.5–5.1)
Sodium: 136 mmol/L (ref 135–145)

## 2017-04-21 LAB — HCG, SERUM, QUALITATIVE: PREG SERUM: NEGATIVE

## 2017-04-26 NOTE — H&P (Signed)
PREOP HISTORY AND PHYSICAL   Patient ID: [name], female   DOB: December 16, 1970, 47 y.o.   MRN: [id number]       Chief Complaint  Patient presents with  . Knee Pain      left s/p left knee arthroscopy on 10/20/16      HPI 47 year old female status post knee arthroscopy and microfracture of the medial femoral condyle presents with continued pain swelling medial side of the joint   She has required Dilaudid she is allergic to any other opioids to get pain relief.   She says she walks all day and it is swollen by the end of the day and she cannot really see herself continuing to work like this.   So the pain scale was 8-10 out of 10 duration of pain several months now greater than 5 timing occurs with walking sitting context not improved with any measures quality dull aching    Past Medical History:  Diagnosis Date  . Arthritis   . Carpal tunnel syndrome, bilateral   . Diabetes mellitus without complication ([place])    no meds; diet controlled  . Diverticulitis   . Hypertension   . PONV (postoperative nausea and vomiting)   . Sciatica    Past Surgical History:  Procedure Laterality Date  . ablasion of uterus    . CARPAL TUNNEL RELEASE  11/10/2010   Procedure: CARPAL TUNNEL RELEASE;  Surgeon: Arther Abbott, MD;  Location: [facility];  Service: Orthopedics;  Laterality: Right;  . CHOLECYSTECTOMY     [facility]  . COLON RESECTION N/A 04/08/2015   Procedure: LAPAROSCOPIC HAND ASSISTED PARTIAL COLECTOMY  CONVERTED TO OPEN AT 0488;  Surgeon: Aviva Signs, MD;  Location: [facility];  Service: General;  Laterality: N/A;  . COLONOSCOPY N/A 03/28/2014   Procedure: COLONOSCOPY;  Surgeon: Rogene Houston, MD;  Location: [facility];  Service: Endoscopy;  Laterality: N/A;  730  . KNEE ARTHROSCOPY WITH DRILLING/MICROFRACTURE Left 10/20/2016   Procedure: LEFT KNEE ARTHROSCOPY WITH MICROFRACTURE;  Surgeon: Carole Civil, MD;  Location: [facility];  Service: Orthopedics;  Laterality: Left;  . KNEE SURGERY     . PARTIAL COLECTOMY N/A 04/08/2015   Procedure: PARTIAL COLECTOMY;  Surgeon: Aviva Signs, MD;  Location: [facility];  Service: General;  Laterality: N/A;  . right knee arthroscopy    . TUBAL LIGATION     Family History  Problem Relation Age of Onset  . Diabetes type I Mother   . Hyperlipidemia Mother   . Diabetes type II Father   . Hypertension Father   . Hyperlipidemia Father   . Diabetes type II Brother   . Diabetes type II Brother   . Cancer Unknown   . Diabetes Unknown   . Arthritis Unknown   . Asthma Unknown   . Anesthesia problems Neg Hx     Social History   Tobacco Use  . Smoking status: Current Every Day Smoker    Packs/day: 1.00    Years: 26.00    Pack years: 26.00    Types: Cigarettes  . Smokeless tobacco: Never Used  Substance Use Topics  . Alcohol use: No    Alcohol/week: 0.0 oz  . Drug use: No    Review of Systems  Constitutional: Negative.   Respiratory: Negative.   Cardiovascular: Negative.     Active Medications      Current Meds  Medication Sig  . acetaminophen (TYLENOL) 500 MG tablet Take 1,000 mg by mouth every 6 (six) hours as  needed for moderate pain.   Marland Kitchen HYDROmorphone (DILAUDID) 2 MG tablet Take 1 tablet (2 mg total) by mouth every 6 (six) hours as needed for severe pain.  Marland Kitchen ibuprofen (ADVIL,MOTRIN) 200 MG tablet Take 800 mg by mouth every 8 (eight) hours as needed for mild pain or moderate pain.  . promethazine (PHENERGAN) 12.5 MG tablet Take 1 tablet (12.5 mg total) by mouth every 4 (four) hours as needed for nausea or vomiting.             Allergies  Allergen Reactions  . Flagyl [Metronidazole] Nausea Only  . Hydrocodone Nausea And Vomiting      Can take need to take with medication for nausea and vomitting        BP (!) 142/95   Pulse 81   Ht 5\' 6"  (1.676 m)   Wt 214 lb (97.1 kg)   BMI 34.54 kg/m    Physical Exam  Constitutional: She is oriented to person, place, and time. She appears well-developed and well-nourished.   Musculoskeletal:       Left knee: She exhibits swelling, effusion and bony tenderness. She exhibits normal range of motion, no ecchymosis, no deformity, no laceration, no erythema, normal alignment, no LCL laxity, normal patellar mobility, normal meniscus and no MCL laxity. Tenderness found. Medial joint line tenderness noted. No lateral joint line, no MCL, no LCL and no patellar tendon tenderness noted.  Neurological: She is alert and oriented to person, place, and time.  Skin: Skin is warm and dry. No rash noted. No erythema. No pallor.  Psychiatric: She has a normal mood and affect. Judgment normal.  Vitals reviewed.   Upper extremities normal alignment no contracture of the joints no instability or laxity or subluxation of the right or left upper extremity muscle tone is normal in both upper extremity skin normal in both upper extremities   Medical decision-making     Encounter Diagnoses  Name Primary?  . S/P left knee arthroscopy 10/20/16 Yes  . 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 Primary osteoarthritis of left knee                Meds ordered this encounter  Medications  . HYDROmorphone (DILAUDID) 2 MG tablet      Sig: Take 1 tablet (2 mg total) by mouth every 6 (six) hours as needed for severe pain.      Dispense:  28 tablet      Refill:  0    After discussing this with her we have decided to proceed with arthroscopy, possible microfracture versus oats procedure and if neither of those are possible or if it does not work then we would proceed with knee replacement surgery   I repeated her x-ray and she has moderate joint space narrowing but by no means bone-on-bone changes of the are present   The procedure has been fully reviewed with the patient; The risks and benefits of surgery have been discussed and explained and understood. Alternative treatment has also been reviewed, questions were encouraged and answered. The postoperative plan is also been reviewed.      Arthroscopy left knee microfracture versus oats procedure

## 2017-04-27 ENCOUNTER — Encounter (HOSPITAL_COMMUNITY)
Admission: RE | Disposition: A | Discharge status: Home / Self Care | Payer: Self-pay | Source: Ambulatory Visit | Attending: Orthopedic Surgery

## 2017-04-27 ENCOUNTER — Ambulatory Visit (HOSPITAL_COMMUNITY)
Admission: RE | Admit: 2017-04-27 | Discharge: 2017-04-27 | Disposition: A | Discharge status: Home / Self Care | Payer: Managed Care, Other (non HMO) | Source: Ambulatory Visit | Attending: Orthopedic Surgery | Admitting: Orthopedic Surgery

## 2017-04-27 ENCOUNTER — Ambulatory Visit (HOSPITAL_COMMUNITY): Payer: Managed Care, Other (non HMO) | Admitting: Anesthesiology

## 2017-04-27 ENCOUNTER — Encounter (HOSPITAL_COMMUNITY): Payer: Self-pay

## 2017-04-27 ENCOUNTER — Other Ambulatory Visit: Payer: Self-pay | Admitting: Orthopedic Surgery

## 2017-04-27 DIAGNOSIS — M21862 Other specified acquired deformities of left lower leg: Secondary | ICD-10-CM | POA: Diagnosis present

## 2017-04-27 DIAGNOSIS — M1990 Unspecified osteoarthritis, unspecified site: Secondary | ICD-10-CM | POA: Insufficient documentation

## 2017-04-27 DIAGNOSIS — E119 Type 2 diabetes mellitus without complications: Secondary | ICD-10-CM | POA: Diagnosis not present

## 2017-04-27 DIAGNOSIS — M94262 Chondromalacia, left knee: Secondary | ICD-10-CM | POA: Diagnosis not present

## 2017-04-27 DIAGNOSIS — Z8719 Personal history of other diseases of the digestive system: Secondary | ICD-10-CM | POA: Insufficient documentation

## 2017-04-27 DIAGNOSIS — M549 Dorsalgia, unspecified: Secondary | ICD-10-CM | POA: Diagnosis not present

## 2017-04-27 DIAGNOSIS — M238X1 Other internal derangements of right knee: Secondary | ICD-10-CM

## 2017-04-27 DIAGNOSIS — G8929 Other chronic pain: Secondary | ICD-10-CM | POA: Diagnosis not present

## 2017-04-27 DIAGNOSIS — M2242 Chondromalacia patellae, left knee: Secondary | ICD-10-CM | POA: Insufficient documentation

## 2017-04-27 DIAGNOSIS — Z9889 Other specified postprocedural states: Secondary | ICD-10-CM

## 2017-04-27 DIAGNOSIS — M5430 Sciatica, unspecified side: Secondary | ICD-10-CM | POA: Insufficient documentation

## 2017-04-27 DIAGNOSIS — F17210 Nicotine dependence, cigarettes, uncomplicated: Secondary | ICD-10-CM | POA: Diagnosis not present

## 2017-04-27 DIAGNOSIS — Z4889 Encounter for other specified surgical aftercare: Secondary | ICD-10-CM

## 2017-04-27 DIAGNOSIS — Z79899 Other long term (current) drug therapy: Secondary | ICD-10-CM | POA: Insufficient documentation

## 2017-04-27 DIAGNOSIS — Z9049 Acquired absence of other specified parts of digestive tract: Secondary | ICD-10-CM | POA: Diagnosis not present

## 2017-04-27 DIAGNOSIS — Z885 Allergy status to narcotic agent status: Secondary | ICD-10-CM | POA: Diagnosis not present

## 2017-04-27 DIAGNOSIS — I10 Essential (primary) hypertension: Secondary | ICD-10-CM | POA: Insufficient documentation

## 2017-04-27 DIAGNOSIS — Z888 Allergy status to other drugs, medicaments and biological substances status: Secondary | ICD-10-CM | POA: Diagnosis not present

## 2017-04-27 HISTORY — PX: KNEE ARTHROSCOPY WITH OSTEOCHONDRAL DEFECT REPAIR: SHX6579

## 2017-04-27 HISTORY — PX: KNEE ARTHROSCOPY WITH DRILLING/MICROFRACTURE: SHX6425

## 2017-04-27 LAB — GLUCOSE, CAPILLARY
GLUCOSE-CAPILLARY: 315 mg/dL — AB (ref 65–99)
GLUCOSE-CAPILLARY: 277 mg/dL — AB (ref 65–99)
Glucose-Capillary: 230 mg/dL — ABNORMAL HIGH (ref 65–99)
Glucose-Capillary: 222 mg/dL — ABNORMAL HIGH (ref 65–99)

## 2017-04-27 SURGERY — ARTHROSCOPY, KNEE, WITH ABRASION ARTHROPLASTY OR MICROFRACTURE
Anesthesia: General | Site: Knee | Laterality: Left

## 2017-04-27 MED ORDER — ONDANSETRON HCL 4 MG/2ML IJ SOLN
4.0000 mg | Freq: Once | INTRAMUSCULAR | Status: AC
  Administered 2017-04-27: 4 mg via INTRAVENOUS

## 2017-04-27 MED ORDER — INSULIN ASPART 100 UNIT/ML ~~LOC~~ SOLN
5.0000 [IU] | Freq: Once | SUBCUTANEOUS | Status: AC
  Administered 2017-04-27: 5 [IU] via SUBCUTANEOUS
  Filled 2017-04-27: qty 0.05

## 2017-04-27 MED ORDER — MIDAZOLAM HCL 2 MG/2ML IJ SOLN
INTRAMUSCULAR | Status: AC
  Filled 2017-04-27: qty 2

## 2017-04-27 MED ORDER — PROMETHAZINE HCL 12.5 MG PO TABS: 12.5000 mg | ORAL_TABLET | ORAL | 5 refills | Status: DC | PRN

## 2017-04-27 MED ORDER — FENTANYL CITRATE (PF) 100 MCG/2ML IJ SOLN
INTRAMUSCULAR | Status: DC | PRN
  Administered 2017-04-27: 25 ug via INTRAVENOUS
  Administered 2017-04-27 (×2): 50 ug via INTRAVENOUS
  Administered 2017-04-27: 100 ug via INTRAVENOUS
  Administered 2017-04-27: 50 ug via INTRAVENOUS
  Administered 2017-04-27: 25 ug via INTRAVENOUS
  Administered 2017-04-27 (×3): 50 ug via INTRAVENOUS

## 2017-04-27 MED ORDER — PROPOFOL 10 MG/ML IV BOLUS
INTRAVENOUS | Status: AC
  Filled 2017-04-27: qty 40

## 2017-04-27 MED ORDER — BUPIVACAINE-EPINEPHRINE (PF) 0.5% -1:200000 IJ SOLN
INTRAMUSCULAR | Status: DC | PRN
  Administered 2017-04-27: 60 mL via PERINEURAL

## 2017-04-27 MED ORDER — ACETAMINOPHEN 500 MG PO TABS
ORAL_TABLET | ORAL | Status: AC
  Filled 2017-04-27: qty 1

## 2017-04-27 MED ORDER — CEFAZOLIN SODIUM-DEXTROSE 2-4 GM/100ML-% IV SOLN
2.0000 g | INTRAVENOUS | Status: AC
  Administered 2017-04-27: 2 g via INTRAVENOUS
  Filled 2017-04-27: qty 100

## 2017-04-27 MED ORDER — SODIUM CHLORIDE 0.9 % IR SOLN
Status: DC | PRN
  Administered 2017-04-27: 1000 mL

## 2017-04-27 MED ORDER — SODIUM CHLORIDE 0.9 % IR SOLN
Status: DC | PRN
Start: 2017-04-27 — End: 2017-04-27
  Administered 2017-04-27 (×7): 3000 mL

## 2017-04-27 MED ORDER — PROPOFOL 10 MG/ML IV BOLUS
INTRAVENOUS | Status: DC | PRN
  Administered 2017-04-27: 30 mg via INTRAVENOUS
  Administered 2017-04-27: 200 mg via INTRAVENOUS
  Administered 2017-04-27: 30 mg via INTRAVENOUS
  Administered 2017-04-27: 20 mg via INTRAVENOUS

## 2017-04-27 MED ORDER — FENTANYL CITRATE (PF) 100 MCG/2ML IJ SOLN
INTRAMUSCULAR | Status: AC
  Filled 2017-04-27: qty 2

## 2017-04-27 MED ORDER — EPINEPHRINE PF 1 MG/ML IJ SOLN
INTRAMUSCULAR | Status: AC
Start: 2017-04-27 — End: 2017-04-27
  Filled 2017-04-27: qty 7

## 2017-04-27 MED ORDER — SEVOFLURANE IN SOLN
RESPIRATORY_TRACT | Status: AC
  Filled 2017-04-27: qty 250

## 2017-04-27 MED ORDER — HYDROMORPHONE HCL 1 MG/ML IJ SOLN: 0.2500 mg | INTRAMUSCULAR | Status: DC | PRN

## 2017-04-27 MED ORDER — SODIUM CHLORIDE 0.9 % IV SOLN
INTRAVENOUS | Status: DC
  Administered 2017-04-27 (×2): via INTRAVENOUS

## 2017-04-27 MED ORDER — ACETAMINOPHEN 500 MG PO TABS
500.0000 mg | ORAL_TABLET | Freq: Once | ORAL | Status: AC
  Administered 2017-04-27: 500 mg via ORAL

## 2017-04-27 MED ORDER — ONDANSETRON HCL 4 MG/2ML IJ SOLN
INTRAMUSCULAR | Status: AC
Start: 2017-04-27 — End: 2017-04-27
  Filled 2017-04-27: qty 2

## 2017-04-27 MED ORDER — MIDAZOLAM HCL 2 MG/2ML IJ SOLN
1.0000 mg | INTRAMUSCULAR | Status: AC
  Administered 2017-04-27: 2 mg via INTRAVENOUS
  Filled 2017-04-27: qty 2

## 2017-04-27 MED ORDER — KETOROLAC TROMETHAMINE 30 MG/ML IJ SOLN
30.0000 mg | Freq: Once | INTRAMUSCULAR | Status: AC
  Administered 2017-04-27: 30 mg via INTRAVENOUS

## 2017-04-27 MED ORDER — LIDOCAINE HCL (CARDIAC) 10 MG/ML IV SOLN
INTRAVENOUS | Status: DC | PRN
  Administered 2017-04-27: 50 mg via INTRAVENOUS

## 2017-04-27 MED ORDER — ONDANSETRON HCL 4 MG/2ML IJ SOLN
INTRAMUSCULAR | Status: DC | PRN
  Administered 2017-04-27: 4 mg via INTRAVENOUS

## 2017-04-27 MED ORDER — EPINEPHRINE PF 1 MG/ML IJ SOLN
INTRAMUSCULAR | Status: AC
  Filled 2017-04-27: qty 5

## 2017-04-27 MED ORDER — FENTANYL CITRATE (PF) 250 MCG/5ML IJ SOLN
INTRAMUSCULAR | Status: AC
  Filled 2017-04-27: qty 5

## 2017-04-27 MED ORDER — CHLORHEXIDINE GLUCONATE 4 % EX LIQD: 60.0000 mL | Freq: Once | CUTANEOUS | Status: DC

## 2017-04-27 MED ORDER — LIDOCAINE HCL (PF) 1 % IJ SOLN
INTRAMUSCULAR | Status: AC
  Filled 2017-04-27: qty 5

## 2017-04-27 MED ORDER — HYDROMORPHONE HCL 4 MG PO TABS
4.0000 mg | ORAL_TABLET | Freq: Once | ORAL | Status: AC
  Administered 2017-04-27: 4 mg via ORAL

## 2017-04-27 MED ORDER — BUPIVACAINE-EPINEPHRINE (PF) 0.5% -1:200000 IJ SOLN
INTRAMUSCULAR | Status: AC
Start: 2017-04-27 — End: 2017-04-27
  Filled 2017-04-27: qty 60

## 2017-04-27 MED ORDER — KETOROLAC TROMETHAMINE 30 MG/ML IJ SOLN
INTRAMUSCULAR | Status: AC
  Filled 2017-04-27: qty 1

## 2017-04-27 MED ORDER — HYDROMORPHONE HCL 2 MG PO TABS: ORAL_TABLET | ORAL | 0 refills | Status: DC

## 2017-04-27 MED ORDER — ONDANSETRON HCL 4 MG/2ML IJ SOLN
INTRAMUSCULAR | Status: AC
  Filled 2017-04-27: qty 2

## 2017-04-27 MED ORDER — MIDAZOLAM HCL 2 MG/2ML IJ SOLN
INTRAMUSCULAR | Status: DC | PRN
  Administered 2017-04-27: 2 mg via INTRAVENOUS

## 2017-04-27 SURGICAL SUPPLY — 56 items
BAG HAMPER (MISCELLANEOUS) ×2 IMPLANT
BANDAGE ELASTIC 6 LF NS (GAUZE/BANDAGES/DRESSINGS) ×2 IMPLANT
BIT DRILL 2.4X128 (BIT) ×2 IMPLANT
BLADE AGGRESSIVE PLUS 4.0 (BLADE) ×2 IMPLANT
BLADE SURG SZ11 CARB STEEL (BLADE) ×2 IMPLANT
BNDG CMPR MED 5X6 ELC HKLP NS (GAUZE/BANDAGES/DRESSINGS) ×1
CHLORAPREP W/TINT 26ML (MISCELLANEOUS) ×2 IMPLANT
CLOTH BEACON ORANGE TIMEOUT ST (SAFETY) ×2 IMPLANT
COOLER CRYO IC GRAV AND TUBE (ORTHOPEDIC SUPPLIES) ×2 IMPLANT
CUFF CRYO KNEE18X23 MED (MISCELLANEOUS) ×1 IMPLANT
CUFF TOURNIQUET SINGLE 34IN LL (TOURNIQUET CUFF) ×1 IMPLANT
DECANTER SPIKE VIAL GLASS SM (MISCELLANEOUS) ×4 IMPLANT
ELECT REM PT RETURN 9FT ADLT (ELECTROSURGICAL) ×2
ELECTRODE REM PT RTRN 9FT ADLT (ELECTROSURGICAL) IMPLANT
GAUZE SPONGE 4X4 12PLY STRL (GAUZE/BANDAGES/DRESSINGS) ×2 IMPLANT
GAUZE SPONGE 4X4 16PLY XRAY LF (GAUZE/BANDAGES/DRESSINGS) ×2 IMPLANT
GAUZE XEROFORM 5X9 LF (GAUZE/BANDAGES/DRESSINGS) ×2 IMPLANT
GLOVE BIOGEL PI IND STRL 7.0 (GLOVE) ×1 IMPLANT
GLOVE BIOGEL PI INDICATOR 7.0 (GLOVE) ×1
GLOVE SKINSENSE NS SZ8.0 LF (GLOVE) ×1
GLOVE SKINSENSE STRL SZ8.0 LF (GLOVE) ×1 IMPLANT
GLOVE SS N UNI LF 8.5 STRL (GLOVE) ×2 IMPLANT
GOWN STRL REUS W/TWL LRG LVL3 (GOWN DISPOSABLE) ×3 IMPLANT
GOWN STRL REUS W/TWL XL LVL3 (GOWN DISPOSABLE) ×2 IMPLANT
IV NS IRRIG 3000ML ARTHROMATIC (IV SOLUTION) ×9 IMPLANT
KIT BLADEGUARD II DBL (SET/KITS/TRAYS/PACK) ×2 IMPLANT
KIT BONE GRAFT OATS 10 DISP (MISCELLANEOUS) ×1 IMPLANT
KIT ROOM TURNOVER AP CYSTO (KITS) ×2 IMPLANT
MANIFOLD NEPTUNE II (INSTRUMENTS) ×2 IMPLANT
MARKER SKIN DUAL TIP RULER LAB (MISCELLANEOUS) ×2 IMPLANT
NDL HYPO 18GX1.5 BLUNT FILL (NEEDLE) ×1 IMPLANT
NDL HYPO 21X1.5 SAFETY (NEEDLE) ×1 IMPLANT
NDL SPNL 18GX3.5 QUINCKE PK (NEEDLE) ×1 IMPLANT
NEEDLE HYPO 18GX1.5 BLUNT FILL (NEEDLE) ×2 IMPLANT
NEEDLE HYPO 21X1.5 SAFETY (NEEDLE) ×2 IMPLANT
NEEDLE SPNL 18GX3.5 QUINCKE PK (NEEDLE) ×2 IMPLANT
NS IRRIG 1000ML POUR BTL (IV SOLUTION) ×2 IMPLANT
PACK ARTHRO LIMB DRAPE STRL (MISCELLANEOUS) ×2 IMPLANT
PAD ABD 5X9 TENDERSORB (GAUZE/BANDAGES/DRESSINGS) ×2 IMPLANT
PAD ARMBOARD 7.5X6 YLW CONV (MISCELLANEOUS) ×2 IMPLANT
PADDING CAST COTTON 6X4 STRL (CAST SUPPLIES) ×2 IMPLANT
PENCIL HANDSWITCHING (ELECTRODE) ×1 IMPLANT
SET ARTHROSCOPY INST (INSTRUMENTS) ×2 IMPLANT
SET ARTHROSCOPY PUMP TUBE (IRRIGATION / IRRIGATOR) ×2 IMPLANT
SET BASIN LINEN APH (SET/KITS/TRAYS/PACK) ×2 IMPLANT
SUT ETHILON 3 0 FSL (SUTURE) ×2 IMPLANT
SUT MON AB 2-0 SH 27 (SUTURE) ×2
SUT MON AB 2-0 SH27 (SUTURE) IMPLANT
SUT VIC AB 1 CT1 27 (SUTURE) ×2
SUT VIC AB 1 CT1 27XBRD ANTBC (SUTURE) IMPLANT
SYR 30ML LL (SYRINGE) ×2 IMPLANT
SYR BULB IRRIGATION 50ML (SYRINGE) ×1 IMPLANT
SYRINGE 10CC LL (SYRINGE) ×2 IMPLANT
TUBE CONNECTING 12X1/4 (SUCTIONS) ×4 IMPLANT
TUBING ARTHRO INFLOW-ONLY STRL (TUBING) ×1 IMPLANT
YANKAUER SUCT BULB TIP NO VENT (SUCTIONS) ×1 IMPLANT

## 2017-04-27 NOTE — Progress Notes (Signed)
Dr. Duwayne Heck notified of CBG 277; no new orders at this time.

## 2017-04-27 NOTE — Anesthesia Preprocedure Evaluation (Signed)
Anesthesia Evaluation  Patient identified by MRN, date of birth, ID band Patient awake    Reviewed: Allergy & Precautions, NPO status , Patient's Chart, lab work & pertinent test results  History of Anesthesia Complications (+) PONV and history of anesthetic complications  Airway Mallampati: III  TM Distance: >3 FB Neck ROM: Full    Dental  (+) Teeth Intact   Pulmonary Current Smoker,    breath sounds clear to auscultation       Cardiovascular hypertension, Pt. on medications  Rhythm:Regular Rate:Normal     Neuro/Psych  Neuromuscular disease    GI/Hepatic   Endo/Other  diabetes, Poorly Controlled, Type 2  Renal/GU      Musculoskeletal   Abdominal   Peds  Hematology   Anesthesia Other Findings Chronic back pain & sciatica. Known DM2, stopped metformin 1 yr ago, poorly controlled.  Reproductive/Obstetrics                             Anesthesia Physical Anesthesia Plan  ASA: III  Anesthesia Plan: General   Post-op Pain Management:    Induction: Intravenous  PONV Risk Score and Plan:   Airway Management Planned: LMA  Additional Equipment:   Intra-op Plan:   Post-operative Plan: Extubation in OR  Informed Consent: I have reviewed the patients History and Physical, chart, labs and discussed the procedure including the risks, benefits and alternatives for the proposed anesthesia with the patient or authorized representative who has indicated his/her understanding and acceptance.     Plan Discussed with:   Anesthesia Plan Comments: (IV hydration and 5u novolog in pre-op for CBG=315. )        Anesthesia Quick Evaluation

## 2017-04-27 NOTE — Interval H&P Note (Signed)
History and Physical Interval Note:  04/27/2017 10:25 AM  CBC Latest Ref Rng & Units 04/21/2017 10/14/2016 06/05/2016  WBC 4.0 - 10.5 K/uL 12.1(H) 12.4(H) 15.1(H)  Hemoglobin 12.0 - 15.0 g/dL 15.7(H) 14.2 14.7  Hematocrit 36.0 - 46.0 % 47.0(H) 42.0 42.9  Platelets 150 - 400 K/uL 330 320 333   BMP Latest Ref Rng & Units 04/21/2017 10/14/2016 06/05/2016  Glucose 65 - 99 mg/dL 205(H) 204(H) 186(H)  BUN 6 - 20 mg/dL 17 15 18   Creatinine 0.44 - 1.00 mg/dL 0.50 0.63 0.57  Sodium 135 - 145 mmol/L 136 137 134(L)  Potassium 3.5 - 5.1 mmol/L 3.9 4.0 3.4(L)  Chloride 101 - 111 mmol/L 102 106 103  CO2 22 - 32 mmol/L 21(L) 22 21(L)  Calcium 8.9 - 10.3 mg/dL 9.8 8.8(L) 9.3    [name]  has presented today for surgery, with the diagnosis of chondral defect femoral condyle left knee  The various methods of treatment have been discussed with the patient and family. After consideration of risks, benefits and other options for treatment, the patient has consented to  Procedure(s): KNEE ARTHROSCOPY WITH DRILLING/MICROFRACTURE (Left) as a surgical intervention .  The patient's history has been reviewed, patient examined, no change in status, stable for surgery.  I have reviewed the patient's chart and labs.  Questions were answered to the patient's satisfaction.     Arther Abbott

## 2017-04-27 NOTE — Op Note (Signed)
04/27/2017  12:59 PM  PATIENT:  [name]  47 y.o. 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  I first attempted a chondral graft from the notch but it fractured.  The portal was a central portal through the patellar tendon to gain access to the notch.  I then made a open superolateral arthrotomy and took a graft from that area.  I then made a medial arthrotomy over the lesion and placed the graft through that lesion using the oats Arthrex technique  This gave an excellent graft apposition and contour.  I took the bone from the chondral defect and placed in the superolateral trochlear region.  The wounds were irrigated and closed in layered fashion with 1 Vicryl 2-0 Monocryl and 3-0 nylon    SURGEON:  Surgeon(s) and Role:    * Carole Civil, MD - Primary  PHYSICIAN ASSISTANT:   ASSISTANTS: BETTY ASHLEY   ANESTHESIA:    general  EBL:  0 mL   BLOOD ADMINISTERED:none  DRAINS: none   LOCAL MEDICATIONS USED:  MARCAINE     SPECIMEN:  No Specimen  DISPOSITION OF SPECIMEN:  N/A  COUNTS:  YES  TOURNIQUET:   Total Tourniquet Time Documented: Thigh (Left) - 92 minutes Total: Thigh (Left) - 92 minutes   DICTATION: .Viviann Spare Dictation  PLAN OF CARE: Discharge to home after PACU  PATIENT DISPOSITION:  PACU - hemodynamically stable.   Delay start of Pharmacological VTE agent (>24hrs) due to surgical blood loss or risk of bleeding: not applicable  [id number]

## 2017-04-27 NOTE — Transfer of Care (Signed)
Immediate Anesthesia Transfer of Care Note  Patient: [name]  Procedure(s) Performed: KNEE ARTHROSCOPY WITH DRILLING/MICROFRACTURE (Left Knee) KNEE ARTHROSCOPY WITH OSTEOCHONDRAL DEFECT REPAIR Autograft (Left Knee)  Patient Location: PACU  Anesthesia Type:General  Level of Consciousness: sedated and patient cooperative  Airway & Oxygen Therapy: Patient Spontanous Breathing and non-rebreather face mask  Post-op Assessment: Report given to RN and Post -op Vital signs reviewed and stable  Post vital signs: Reviewed and stable  Last Vitals:  Vitals:   04/27/17 1050 04/27/17 1305  BP: 121/74 (!) 150/86  Pulse:  (!) 103  Resp: 19 (!) 21  Temp:  37.1 C  SpO2:  95%    Last Pain:  Vitals:   04/27/17 0827  TempSrc: Oral  PainSc: 5       Patients Stated Pain Goal: 5 (80/22/33 6122)  Complications: No apparent anesthesia complications  Blood Glucose 222

## 2017-04-27 NOTE — Discharge Instructions (Signed)
No weightbearing on the operative site  Use Cryo/Cuff  Do not change dressing and please see the doctor  It is okay to bend the knee it may be uncomfortable.  Knee Arthroscopy, Care After Refer to this sheet in the next few weeks. These instructions provide you with information about caring for yourself after your procedure. Your health care provider may also give you more specific instructions. Your treatment has been planned according to current medical practices, but problems sometimes occur. Call your health care provider if you have any problems or questions after your procedure. What can I expect after the procedure? After the procedure, it is common to have:  Soreness.  Pain.  Follow these instructions at home: Bathing  Do not take baths, swim, or use a hot tub until your health care provider approves. Incision care  There are many different ways to close and cover an incision, including stitches, skin glue, and adhesive strips. Follow your health care providers instructions about: ? Incision care. ? Bandage (dressing) changes and removal. ? Incision closure removal.  Watch for: ? Redness, swelling, or pain. ? Fluid, blood, or pus. Activity  Avoid strenuous activities for as long as directed by your health care provider.  Return to your normal activities as directed by your health care provider. Ask your health care provider what activities are safe for you.  Perform range-of-motion exercises only as directed by your health care provider.  Do not lift anything that is heavier than 10 lb (4.5 kg).  Do not drive or operate heavy machinery while taking pain medicine.  If you were given crutches, use them as directed by your health care provider. Managing pain, stiffness, and swelling  If directed, apply ice to the injured area: ? Put ice in a plastic bag. ? Place a towel between your skin and the bag. ? Leave the ice on for 20 minutes, 2-3 times per day.  Raise  the injured area above the level of your heart while you are sitting or lying down as directed by your health care provider. General instructions  Keep all follow-up visits as directed by your health care provider. This is important.  Take medicines only as directed by your health care provider.  Do not use any tobacco products, including cigarettes, chewing tobacco, or electronic cigarettes. If you need help quitting, ask your health care provider.  If you were given compression stockings, wear them as directed by your health care provider. These stockings help prevent blood clots and reduce swelling in your legs. Contact a health care provider if:  You have severe pain with any movement of your knee.  You notice a bad smell coming from the incision or dressing.  You have redness, swelling, or pain at the site of your incision.  You have fluid, blood, or pus coming from your incision. Get help right away if:  You develop a rash.  You have a fever.  You have difficulty breathing or have shortness of breath.  You develop pain in your calves or in the back of your knee.  You develop chest pain.  You develop numbness or tingling in your leg or foot. This information is not intended to replace advice given to you by your health care provider. Make sure you discuss any questions you have with your health care provider. Document Released: 10/29/2004 Document Revised: 09/11/2015 Document Reviewed: 04/07/2014 Elsevier Interactive Patient Education  2018 [employer] Anesthesia, Adult, Care After These instructions provide you  with information about caring for yourself after your procedure. Your health care provider may also give you more specific instructions. Your treatment has been planned according to current medical practices, but problems sometimes occur. Call your health care provider if you have any problems or questions after your procedure. What can I expect after  the procedure? After the procedure, it is common to have:  Vomiting.  A sore throat.  Mental slowness.  It is common to feel:  Nauseous.  Cold or shivery.  Sleepy.  Tired.  Sore or achy, even in parts of your body where you did not have surgery.  Follow these instructions at home: For at least 24 hours after the procedure:  Do not: ? Participate in activities where you could fall or become injured. ? Drive. ? Use heavy machinery. ? Drink alcohol. ? Take sleeping pills or medicines that cause drowsiness. ? Make important decisions or sign legal documents. ? Take care of children on your own.  Rest. Eating and drinking  If you vomit, drink water, juice, or soup when you can drink without vomiting.  Drink enough fluid to keep your urine clear or pale yellow.  Make sure you have little or no nausea before eating solid foods.  Follow the diet recommended by your health care provider. General instructions  Have a responsible adult stay with you until you are awake and alert.  Return to your normal activities as told by your health care provider. Ask your health care provider what activities are safe for you.  Take over-the-counter and prescription medicines only as told by your health care provider.  If you smoke, do not smoke without supervision.  Keep all follow-up visits as told by your health care provider. This is important. Contact a health care provider if:  You continue to have nausea or vomiting at home, and medicines are not helpful.  You cannot drink fluids or start eating again.  You cannot urinate after 8-12 hours.  You develop a skin rash.  You have fever.  You have increasing redness at the site of your procedure. Get help right away if:  You have difficulty breathing.  You have chest pain.  You have unexpected bleeding.  You feel that you are having a life-threatening or urgent problem. This information is not intended to replace  advice given to you by your health care provider. Make sure you discuss any questions you have with your health care provider. Document Released: 07/18/2000 Document Revised: 09/14/2015 Document Reviewed: 03/26/2015 Elsevier Interactive Patient Education  [employer].

## 2017-04-27 NOTE — Anesthesia Procedure Notes (Signed)
Procedure Name: LMA Insertion Date/Time: 04/27/2017 11:06 AM Performed by: Vista Deck, CRNA Pre-anesthesia Checklist: Patient identified, Patient being monitored, Emergency Drugs available, Timeout performed and Suction available Patient Re-evaluated:Patient Re-evaluated prior to induction Oxygen Delivery Method: Circle System Utilized Preoxygenation: Pre-oxygenation with 100% oxygen Induction Type: IV induction LMA: LMA inserted LMA Size: 4.0 Number of attempts: 1 Placement Confirmation: positive ETCO2 and breath sounds checked- equal and bilateral Tube secured with: Tape Dental Injury: Teeth and Oropharynx as per pre-operative assessment

## 2017-04-27 NOTE — Brief Op Note (Signed)
04/27/2017  12:59 PM  PATIENT:  [name]  47 y.o. 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  I first attempted a chondral graft from the notch but it fractured.  The portal was a central portal through the patellar tendon to gain access to the notch.  I then made a open superolateral arthrotomy and took a graft from that area.  I then made a medial arthrotomy over the lesion and placed the graft through that lesion using the oats Arthrex technique  This gave an excellent graft apposition and contour.  I took the bone from the chondral defect and placed in the superolateral trochlear region.  The wounds were irrigated and closed in layered fashion with 1 Vicryl 2-0 Monocryl and 3-0 nylon    SURGEON:  Surgeon(s) and Role:    * Carole Civil, MD - Primary  PHYSICIAN ASSISTANT:   ASSISTANTS: BETTY ASHLEY   ANESTHESIA:    general  EBL:  0 mL   BLOOD ADMINISTERED:none  DRAINS: none   LOCAL MEDICATIONS USED:  MARCAINE     SPECIMEN:  No Specimen  DISPOSITION OF SPECIMEN:  N/A  COUNTS:  YES  TOURNIQUET:   Total Tourniquet Time Documented: Thigh (Left) - 92 minutes Total: Thigh (Left) - 92 minutes   DICTATION: .Viviann Spare Dictation  PLAN OF CARE: Discharge to home after PACU  PATIENT DISPOSITION:  PACU - hemodynamically stable.   Delay start of Pharmacological VTE agent (>24hrs) due to surgical blood loss or risk of bleeding: not applicable  [id number]

## 2017-04-27 NOTE — Anesthesia Postprocedure Evaluation (Signed)
Anesthesia Post Note  Patient: [name]  Procedure(s) Performed: KNEE ARTHROSCOPY WITH DRILLING/MICROFRACTURE (Left Knee) KNEE ARTHROSCOPY WITH OSTEOCHONDRAL DEFECT REPAIR Autograft (Left Knee)  Patient location during evaluation: PACU Anesthesia Type: General Level of consciousness: awake and alert Pain management: satisfactory to patient Vital Signs Assessment: post-procedure vital signs reviewed and stable Respiratory status: spontaneous breathing Cardiovascular status: stable Postop Assessment: no apparent nausea or vomiting Anesthetic complications: no     Last Vitals:  Vitals:   04/27/17 1345 04/27/17 1400  BP: 137/78 135/78  Pulse: 95 89  Resp: 20 18  Temp:    SpO2: 94% 91%    Last Pain:  Vitals:   04/27/17 1345  TempSrc:   PainSc: (P) 4                  Lajuana Patchell

## 2017-04-28 ENCOUNTER — Telehealth: Payer: Self-pay | Admitting: Orthopedic Surgery

## 2017-04-28 ENCOUNTER — Encounter (HOSPITAL_COMMUNITY): Payer: Self-pay | Admitting: Orthopedic Surgery

## 2017-04-28 NOTE — Telephone Encounter (Signed)
Thanks I called her to advise.  

## 2017-04-28 NOTE — Telephone Encounter (Signed)
I called patient to answer her questions, she has some numbness from her Thigh down to her toes, states she can pinch the thigh and not feel it.  I told her at this point the numbness should be improving, she states it is not, please advise.

## 2017-04-28 NOTE — Telephone Encounter (Signed)
Patient has some questions and would like to speak with a nurse.  She had L Knee arthroscopy yesterday   Please call and advise.

## 2017-04-28 NOTE — Telephone Encounter (Signed)
Nothing to worry about  The surgery did not involve any of those nerves ; most likely positioning and will resolve on its own

## 2017-05-05 ENCOUNTER — Ambulatory Visit (INDEPENDENT_AMBULATORY_CARE_PROVIDER_SITE_OTHER): Payer: Managed Care, Other (non HMO) | Admitting: Orthopedic Surgery

## 2017-05-05 DIAGNOSIS — Z4889 Encounter for other specified surgical aftercare: Secondary | ICD-10-CM

## 2017-05-05 DIAGNOSIS — Z9889 Other specified postprocedural states: Secondary | ICD-10-CM

## 2017-05-05 MED ORDER — OXYCODONE-ACETAMINOPHEN 10-325 MG PO TABS: 1.0000 | ORAL_TABLET | ORAL | 0 refills | Status: DC | PRN

## 2017-05-05 MED ORDER — PROMETHAZINE HCL 12.5 MG PO TABS: 12.5000 mg | ORAL_TABLET | ORAL | 5 refills | Status: DC | PRN

## 2017-05-05 NOTE — Progress Notes (Signed)
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  acetaminophen (TYLENOL) 500 MG tablet, Take 1,000 mg by mouth every 6 (six) hours as needed for moderate pain. , Disp: , Rfl:  .  HYDROmorphone (DILAUDID) 2 MG tablet, Take 2 tablets every 4 hours as needed for pain for the first 48 hours then 1 tablet every 4 hours as needed for pain, Disp: 50 tablet, Rfl: 0 .  ibuprofen (ADVIL,MOTRIN) 200 MG tablet, Take 800 mg by mouth every 8 (eight) hours as needed for mild pain or moderate pain., Disp: , Rfl:  .  promethazine (PHENERGAN) 12.5 MG tablet, Take 1 tablet (12.5 mg total) by mouth every 4 (four) hours as needed for nausea or vomiting., Disp: 45 tablet, Rfl: 5  Complains of pain and breakthrough pain despite being on 4 mg Dilaudid.  (Patient was on 2 mg Dilaudid as needed and has developed some opioid tolerance)  Knee incision 3 incisions look clean dry and intact patient's knee flexion is approximately 75 degrees with extension deficit 10 degrees  Assessment and plan the patient can start weightbearing with crutches and brace  Pain medication will be changed.  Current Meds  Medication Sig  . acetaminophen (TYLENOL) 500 MG tablet Take 1,000 mg by mouth every 6 (six) hours as needed for moderate pain.   Marland Kitchen HYDROmorphone (DILAUDID) 2 MG tablet Take 2 tablets every 4 hours as needed for pain for the first 48 hours then 1 tablet every 4 hours as needed for pain  . ibuprofen (ADVIL,MOTRIN) 200 MG tablet Take 800 mg by mouth every 8 (eight) hours as needed for mild pain or moderate pain.  . promethazine (PHENERGAN) 12.5 MG tablet Take 1 tablet (12.5 mg total) by mouth every 4 (four) hours as needed for nausea or vomiting.  . [DISCONTINUED] promethazine (PHENERGAN) 12.5 MG tablet Take 1 tablet (12.5 mg total) by mouth every 4 (four) hours as needed for nausea or vomiting.

## 2017-05-05 NOTE — Patient Instructions (Signed)
Make sure you are continue to ice the knee  Placed the brace on use the crutches and start weightbearing as tolerated

## 2017-05-10 ENCOUNTER — Encounter: Payer: Self-pay | Admitting: Orthopedic Surgery

## 2017-05-10 ENCOUNTER — Ambulatory Visit (INDEPENDENT_AMBULATORY_CARE_PROVIDER_SITE_OTHER): Payer: Managed Care, Other (non HMO) | Admitting: Orthopedic Surgery

## 2017-05-10 VITALS — BP 155/88 | HR 97 | Ht 66.0 in | Wt 214.0 lb

## 2017-05-10 DIAGNOSIS — Z9889 Other specified postprocedural states: Secondary | ICD-10-CM

## 2017-05-10 DIAGNOSIS — Z4889 Encounter for other specified surgical aftercare: Secondary | ICD-10-CM

## 2017-05-10 MED ORDER — PROMETHAZINE HCL 12.5 MG PO TABS: 12.5000 mg | ORAL_TABLET | ORAL | 5 refills | Status: DC | PRN

## 2017-05-10 MED ORDER — OXYCODONE-ACETAMINOPHEN 10-325 MG PO TABS: 1.0000 | ORAL_TABLET | ORAL | 0 refills | Status: DC | PRN

## 2017-05-10 NOTE — Progress Notes (Signed)
Postop visit  Chief Complaint  Patient presents with  . Follow-up    Recheck on left knee, DOS 04-27-17. Suture removal.   Suture removal status post oats procedure left knee  The patient is getting better pain relief with Percocet Phenergan combination  All sutures were removed there are no signs of infection she is weightbearing in a brace with crutches  She can start range of motion exercises continue weightbearing follow-up in 3 weeks medication refilled

## 2017-05-18 ENCOUNTER — Other Ambulatory Visit: Payer: Self-pay | Admitting: Orthopedic Surgery

## 2017-05-18 ENCOUNTER — Telehealth: Payer: Self-pay | Admitting: Orthopedic Surgery

## 2017-05-18 MED ORDER — OXYCODONE-ACETAMINOPHEN 10-325 MG PO TABS: 1.0000 | ORAL_TABLET | ORAL | 0 refills | Status: DC | PRN

## 2017-05-18 NOTE — Telephone Encounter (Signed)
Oxycodone-Acetaminophen  10/325mg   Qty 30 Tablets  Take 1 tablet by mouth every 4(four) hours as needed for pain.  Patient uses [employer]

## 2017-05-31 ENCOUNTER — Ambulatory Visit (INDEPENDENT_AMBULATORY_CARE_PROVIDER_SITE_OTHER): Payer: Managed Care, Other (non HMO) | Admitting: Orthopedic Surgery

## 2017-05-31 ENCOUNTER — Encounter: Payer: Self-pay | Admitting: Orthopedic Surgery

## 2017-05-31 VITALS — BP 143/86 | HR 82 | Ht 60.0 in | Wt 214.0 lb

## 2017-05-31 DIAGNOSIS — Z4889 Encounter for other specified surgical aftercare: Secondary | ICD-10-CM

## 2017-05-31 DIAGNOSIS — Z9889 Other specified postprocedural states: Secondary | ICD-10-CM

## 2017-05-31 MED ORDER — PROMETHAZINE HCL 12.5 MG PO TABS: 12.5000 mg | ORAL_TABLET | ORAL | 5 refills | Status: DC | PRN

## 2017-05-31 MED ORDER — HYDROCODONE-ACETAMINOPHEN 5-325 MG PO TABS: 1.0000 | ORAL_TABLET | Freq: Four times a day (QID) | ORAL | 0 refills | Status: DC | PRN

## 2017-05-31 NOTE — Patient Instructions (Addendum)
START KNEE EXERCISES ON GREEN PAPER, BRACE, ICE   Smoking Tobacco Information Smoking tobacco will very likely harm your health. Tobacco contains a poisonous (toxic), colorless chemical called nicotine. Nicotine affects the brain and makes tobacco addictive. This change in your brain can make it hard to stop smoking. Tobacco also has other toxic chemicals that can hurt your body and raise your risk of many cancers. How can smoking tobacco affect me? Smoking tobacco can increase your chances of having serious health conditions, such as:  Cancer. Smoking is most commonly associated with lung cancer, but can lead to cancer in other parts of the body.  Chronic obstructive pulmonary disease (COPD). This is a long-term lung condition that makes it hard to breathe. It also gets worse over time.  High blood pressure (hypertension), heart disease, stroke, or heart attack.  Lung infections, such as pneumonia.  Cataracts. This is when the lenses in the eyes become clouded.  Digestive problems. This may include peptic ulcers, heartburn, and gastroesophageal reflux disease (GERD).  Oral health problems, such as gum disease and tooth loss.  Loss of taste and smell.  Smoking can affect your appearance by causing:  Wrinkles.  Yellow or stained teeth, fingers, and fingernails.  Smoking tobacco can also affect your social life.  Many workplaces, [employer], hotels, and public places are tobacco-free. This means that you may experience challenges in finding places to smoke when away from home.  The cost of a smoking habit can be expensive. Expenses for someone who smokes come in two ways: ? You spend money on a regular basis to buy tobacco. ? Your health care costs in the long-term are higher if you smoke.  Tobacco smoke can also affect the health of those around you. Children of smokers have greater chances of: ? Sudden infant death syndrome (SIDS). ? Ear infections. ? Lung infections.  What  lifestyle changes can be made?  Do not start smoking. Quit if you already do.  To quit smoking: ? Make a plan to quit smoking and commit yourself to it. Look for programs to help you and ask your health care provider for recommendations and ideas. ? Talk with your health care provider about using nicotine replacement medicines to help you quit. Medicine replacement medicines include gum, lozenges, patches, sprays, or pills. ? Do not replace cigarette smoking with electronic cigarettes, which are commonly called e-cigarettes. The safety of e-cigarettes is not known, and some may contain harmful chemicals. ? Avoid places, people, or situations that tempt you to smoke. ? If you try to quit but return to smoking, don't give up hope. It is very common for people to try a number of times before they fully succeed. When you feel ready again, give it another try.  Quitting smoking might affect the way you eat as well as your weight. Be prepared to monitor your eating habits. Get support in planning and following a healthy diet.  Ask your health care provider about having regular tests (screenings) to check for cancer. This may include blood tests, imaging tests, and other tests.  Exercise regularly. Consider taking walks, joining a gym, or doing yoga or exercise classes.  Develop skills to manage your stress. These skills include meditation. What are the benefits of quitting smoking? By quitting smoking, you may:  Lower your risk of getting cancer and other diseases caused by smoking.  Live longer.  Breathe better.  Lower your blood pressure and heart rate.  Stop your addiction to tobacco.  Stop  creating secondhand smoke that hurts other people.  Improve your sense of taste and smell.  Look better over time, due to having fewer wrinkles and less staining.  What can happen if changes are not made? If you do not stop smoking, you may:  Get cancer and other diseases.  Develop COPD or  other long-term (chronic) lung conditions.  Develop serious problems with your heart and blood vessels (cardiovascular system).  Need more tests to screen for problems caused by smoking.  Have higher, long-term healthcare costs from medicines or treatments related to smoking.  Continue to have worsening changes in your lungs, mouth, and nose.  Where to find support: To get support to quit smoking, consider:  Asking your health care provider for more information and resources.  Taking classes to learn more about quitting smoking.  Looking for local organizations that offer resources about quitting smoking.  Joining a support group for people who want to quit smoking in your local community.  Where to find more information: You may find more information about quitting smoking from:  HelpGuide.org: www.helpguide.org/articles/addictions/how-to-quit-smoking.htm  https://hall.com/: smokefree.gov  American Lung Association: www.lung.org  Contact a health care provider if:  You have problems breathing.  Your lips, nose, or fingers turn blue.  You have chest pain.  You are coughing up blood.  You feel faint or you pass out.  You have other noticeable changes that cause you to worry. Summary  Smoking tobacco can negatively affect your health, the health of those around you, your finances, and your social life.  Do not start smoking. Quit if you already do. If you need help quitting, ask your health care provider.  Think about joining a support group for people who want to quit smoking in your local community. There are many effective programs that will help you to quit this behavior. This information is not intended to replace advice given to you by your health care provider. Make sure you discuss any questions you have with your health care provider. Document Released: 04/26/2016 Document Revised: 04/26/2016 Document Reviewed: 04/26/2016 Elsevier Interactive Patient Education   [employer].

## 2017-05-31 NOTE — Progress Notes (Signed)
POST OP VISIT   Patient ID: [name], female   DOB: [birth year], 47 y.o.   MRN: [id number]  Chief Complaint  Patient presents with  . Follow-up    left knee arthroscopy 04/27/17    Encounter Diagnosis  Name Primary?  . S/P left knee arthroscopy 04/27/17 Yes   35 days after surgery patient says she is finally made some improvement she had an oats procedure medial femoral condyle  Her range of motion is 5- 110 degrees  Her knee swelling is much better  Recommend strengthening exercises with home exercises for quadriceps strengthening continue weightbearing as tolerated  She asked to have her Norco decreased to 5 mg which is great she will take that with Phenergan to control nausea  Follow-up in 4 weeks to discuss return to work on a sitdown position  Meds ordered this encounter  Medications  . HYDROcodone-acetaminophen (NORCO) 5-325 MG tablet    Sig: Take 1 tablet by mouth every 6 (six) hours as needed for moderate pain.    Dispense:  30 tablet    Refill:  0  . promethazine (PHENERGAN) 12.5 MG tablet    Sig: Take 1 tablet (12.5 mg total) by mouth every 4 (four) hours as needed for nausea or vomiting.    Dispense:  45 tablet    Refill:  5

## 2017-06-30 ENCOUNTER — Ambulatory Visit (INDEPENDENT_AMBULATORY_CARE_PROVIDER_SITE_OTHER): Payer: Managed Care, Other (non HMO)

## 2017-06-30 ENCOUNTER — Ambulatory Visit (INDEPENDENT_AMBULATORY_CARE_PROVIDER_SITE_OTHER): Payer: Managed Care, Other (non HMO) | Admitting: Orthopedic Surgery

## 2017-06-30 VITALS — BP 156/100 | HR 98 | Ht 60.0 in | Wt 214.0 lb

## 2017-06-30 DIAGNOSIS — Z9889 Other specified postprocedural states: Secondary | ICD-10-CM | POA: Diagnosis not present

## 2017-06-30 MED ORDER — HYDROCODONE-ACETAMINOPHEN 7.5-325 MG PO TABS: 1.0000 | ORAL_TABLET | Freq: Four times a day (QID) | ORAL | 0 refills | Status: DC | PRN

## 2017-06-30 MED ORDER — PREDNISONE 10 MG PO TABS: 10.0000 mg | ORAL_TABLET | Freq: Three times a day (TID) | ORAL | 0 refills | Status: DC

## 2017-06-30 NOTE — Patient Instructions (Signed)
Heel slides only 3 times a day  Ice as needed control swelling  Stop ibuprofen start prednisone 10 mg 3 times a day   use a cane in the right hand

## 2017-06-30 NOTE — Progress Notes (Signed)
POST OP VISIT   Patient ID: [name], female   DOB: December 19, 1970, 47 y.o.   MRN: [id number]  Chief Complaint  Patient presents with  . Follow-up    left knee arthroscopy 04/27/17, OATS [facility]   She is 9 weeks out from the oats procedure after previous attempt at microfracture.  She complains of catching increased pain and swelling especially with extension and for the last 2 weeks.  Her current examination reveals range of motion 8-20 degrees mild effusion poor quadriceps control poor extension with extensor lag noted  She exhibits significant limping  X-ray was obtained today and that shows (please see dictated report): graft Incorporated no evidence of dislodgment switch to prednisone stop ibuprofen increased pain medicine to 7.5 mg follow-up 2 weeks  No work 2 weeks.     Last time :  Encounter Diagnosis  Name Primary?  . S/P left knee arthroscopy 04/27/17 Yes   35 days after surgery patient says she is finally made some improvement she had an oats procedure medial femoral condyle  Her range of motion is 5- 110 degrees  Her knee swelling is much better  Recommend strengthening exercises with home exercises for quadriceps strengthening continue weightbearing as tolerated  She asked to have her Norco decreased to 5 mg which is great she will take that with Phenergan to control nausea  Follow-up in 4 weeks to discuss return to work on a sitdown position  Meds ordered this encounter  Medications  . HYDROcodone-acetaminophen (NORCO) 5-325 MG tablet    Sig: Take 1 tablet by mouth every 6 (six) hours as needed for moderate pain.    Dispense:  30 tablet    Refill:  0  . promethazine (PHENERGAN) 12.5 MG tablet    Sig: Take 1 tablet (12.5 mg total) by mouth every 4 (four) hours as needed for nausea or vomiting.    Dispense:  45 tablet    Refill:  5

## 2017-07-05 ENCOUNTER — Other Ambulatory Visit: Payer: Self-pay | Admitting: Orthopedic Surgery

## 2017-07-05 DIAGNOSIS — Z4889 Encounter for other specified surgical aftercare: Secondary | ICD-10-CM

## 2017-07-05 DIAGNOSIS — Z9889 Other specified postprocedural states: Secondary | ICD-10-CM

## 2017-07-05 MED ORDER — PROMETHAZINE HCL 12.5 MG PO TABS: 12.5000 mg | ORAL_TABLET | ORAL | 5 refills | Status: DC | PRN

## 2017-07-05 NOTE — Telephone Encounter (Signed)
Patient requests refill on Phenergan 12.5 mgs.  Qty  45  Sig: Take 1 tablet (12.5 mg total) by mouth every 4 (four) hours as needed for nausea or vomiting.  Patient states she uses [facility] in [place]

## 2017-07-14 ENCOUNTER — Encounter: Payer: Self-pay | Admitting: Orthopedic Surgery

## 2017-07-14 ENCOUNTER — Ambulatory Visit (INDEPENDENT_AMBULATORY_CARE_PROVIDER_SITE_OTHER): Payer: Managed Care, Other (non HMO) | Admitting: Orthopedic Surgery

## 2017-07-14 VITALS — BP 143/89 | HR 77 | Ht 60.0 in | Wt 214.0 lb

## 2017-07-14 DIAGNOSIS — M1712 Unilateral primary osteoarthritis, left knee: Secondary | ICD-10-CM

## 2017-07-14 DIAGNOSIS — M238X2 Other internal derangements of left knee: Secondary | ICD-10-CM

## 2017-07-14 DIAGNOSIS — Z9889 Other specified postprocedural states: Secondary | ICD-10-CM

## 2017-07-14 NOTE — Progress Notes (Signed)
POST OP VISIT   Patient ID: [name], female   DOB: August 10, 1970, 47 y.o.   MRN: [id number]   Chief Complaint  Patient presents with  . Knee Pain    left   Encounter Diagnoses  Name Primary?  . S/P left knee arthroscopy 04/27/17 Yes  . 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 Primary osteoarthritis of left knee     The patient says she is much better it seems that the steroids helped her a lot she still has a limp cannot tolerate the cane or brace but overall improvement only taking 1 pain pill per day  Her current range of motion is approximately 5-115 degrees the swelling has decreased she walks with a limp as stated she will come back in 2 weeks we will discuss return to work  Previous visits are listed below Chief Complaint  Patient presents with  . Follow-up    left knee arthroscopy 04/27/17, OATS [facility]   She is 9 weeks out from the oats procedure after previous attempt at microfracture.  She complains of catching increased pain and swelling especially with extension and for the last 2 weeks.  Her current examination reveals range of motion 8-20 degrees mild effusion poor quadriceps control poor extension with extensor lag noted  She exhibits significant limping  X-ray was obtained today and that shows (please see dictated report): graft Incorporated no evidence of dislodgment switch to prednisone stop ibuprofen increased pain medicine to 7.5 mg follow-up 2 weeks  No work 2 weeks.     Last time :  Encounter Diagnosis  Name Primary?  . S/P left knee arthroscopy 04/27/17 Yes   35 days after surgery patient says she is finally made some improvement she had an oats procedure medial femoral condyle  Her range of motion is 5- 110 degrees  Her knee swelling is much better  Recommend strengthening exercises with home exercises for quadriceps strengthening continue weightbearing as tolerated  She asked to have her Norco decreased to 5 mg which is great she will take that  with Phenergan to control nausea  Follow-up in 4 weeks to discuss return to work on a sitdown position  Meds ordered this encounter  Medications  . HYDROcodone-acetaminophen (NORCO) 5-325 MG tablet    Sig: Take 1 tablet by mouth every 6 (six) hours as needed for moderate pain.    Dispense:  30 tablet    Refill:  0  . promethazine (PHENERGAN) 12.5 MG tablet    Sig: Take 1 tablet (12.5 mg total) by mouth every 4 (four) hours as needed for nausea or vomiting.    Dispense:  45 tablet    Refill:  5

## 2017-07-28 ENCOUNTER — Encounter: Payer: Self-pay | Admitting: Orthopedic Surgery

## 2017-07-28 ENCOUNTER — Ambulatory Visit (INDEPENDENT_AMBULATORY_CARE_PROVIDER_SITE_OTHER): Payer: Managed Care, Other (non HMO) | Admitting: Orthopedic Surgery

## 2017-07-28 VITALS — BP 140/87 | HR 98 | Ht 65.0 in | Wt 214.0 lb

## 2017-07-28 DIAGNOSIS — Z9889 Other specified postprocedural states: Secondary | ICD-10-CM | POA: Diagnosis not present

## 2017-07-28 DIAGNOSIS — M238X2 Other internal derangements of left knee: Secondary | ICD-10-CM

## 2017-07-28 NOTE — Patient Instructions (Signed)
Return to work April 10

## 2017-07-28 NOTE — Progress Notes (Signed)
Follow upVISIT   POD # 92  BP 140/87   Pulse 98   Ht 5\' 5"  (1.651 m)   Wt 214 lb (97.1 kg)   BMI 35.61 kg/m   Encounter Diagnoses  Name Primary?  . S/P left knee arthroscopy 04/27/17 Yes  . 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  Skin: Negative.   Neurological: Negative.     Mrs. Pizzuto thinks she is ready go back to work although she still has weightbearing pain  We have not been able to brace her knee because the braces have caused her knee to hurt  She did a lot of walking yesterday at [facility] knee was swollen she had to go back into a wheelchair to get back to her car   She is awake alert and oriented x3 mood and affect are normal Her knee shows a joint effusion today.  Her flexion is good her extension is good  Her knee is not warm to touch there is no erythema she has a reasonable gait No neurovascular deficits are seen   Encounter Diagnoses  Name Primary?  . S/P left knee arthroscopy 04/27/17   . 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

## 2017-08-29 ENCOUNTER — Ambulatory Visit (INDEPENDENT_AMBULATORY_CARE_PROVIDER_SITE_OTHER): Payer: Self-pay

## 2017-08-29 ENCOUNTER — Ambulatory Visit (INDEPENDENT_AMBULATORY_CARE_PROVIDER_SITE_OTHER): Payer: Self-pay | Admitting: Orthopedic Surgery

## 2017-08-29 ENCOUNTER — Encounter: Payer: Self-pay | Admitting: Orthopedic Surgery

## 2017-08-29 VITALS — BP 146/78 | HR 73 | Ht 65.0 in | Wt 190.0 lb

## 2017-08-29 DIAGNOSIS — M1712 Unilateral primary osteoarthritis, left knee: Secondary | ICD-10-CM

## 2017-08-29 DIAGNOSIS — Z9889 Other specified postprocedural states: Secondary | ICD-10-CM

## 2017-08-29 DIAGNOSIS — M238X2 Other internal derangements of left knee: Secondary | ICD-10-CM

## 2017-08-29 MED ORDER — HYDROCODONE-ACETAMINOPHEN 5-325 MG PO TABS: 1.0000 | ORAL_TABLET | Freq: Four times a day (QID) | ORAL | 0 refills | Status: DC | PRN

## 2017-08-29 MED ORDER — PROMETHAZINE HCL 12.5 MG PO TABS: 12.5000 mg | ORAL_TABLET | Freq: Four times a day (QID) | ORAL | 0 refills | Status: DC | PRN

## 2017-08-29 NOTE — Patient Instructions (Signed)
You have decided to proceed with knee replacement surgery. You have decided not to continue with nonoperative measures such as but not limited to oral medication, weight loss, activity modification, physical therapy, bracing, or injection.  We will perform the procedure commonly known as total knee replacement. Some of the risks associated with knee replacement surgery include but are not limited to Bleeding Infection Swelling Stiffness Blood clot Pain that persists even after surgery  Infection is especially devastating complication of knee surgery although rare. If infection does occur your implant will usually have to be removed and several surgeries and antibiotics will be needed to eradicate the infection prior to performing a repeat replacement.   In some cases amputation is required to eradicate the infection. In other rare cases a knee fusion is needed    If you're not comfortable with these risks and would like to continue with nonoperative treatment please let Dr. Harrison know prior to your surgery.  

## 2017-08-29 NOTE — Progress Notes (Signed)
Progress Note   Patient ID: [name], female   DOB: [birth year], 47 y.o.   MRN: [id number]  Chief Complaint  Patient presents with  . Knee Pain    c/o constant pain using cane for ambulation  . Post-op Follow-up    s/p left knee arthroscopy / chondral defect left femur 04/27/17     Medical decision-making Encounter Diagnoses  Name Primary?  . S/P left knee arthroscopy 04/27/17   . 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 Primary osteoarthritis of left knee Yes    X-rays today show graft intact some settling of the graft  Meds ordered this encounter  Medications  . promethazine (PHENERGAN) 12.5 MG tablet    Sig: Take 1 tablet (12.5 mg total) by mouth every 6 (six) hours as needed for nausea or vomiting.    Dispense:  30 tablet    Refill:  0  . HYDROcodone-acetaminophen (NORCO/VICODIN) 5-325 MG tablet    Sig: Take 1 tablet by mouth every 6 (six) hours as needed for moderate pain.    Dispense:  30 tablet    Refill:  0     PLAN:  Ms. Springsteen and I have discussed this at length.  She has had the microfracture followed by the oats procedure she has not improved in any significant way to get her back to her normal activities of daily living this was especially noted when she tried to go back to work even in a brace.  She is miserable she can perform her activities of daily living she is having severe pain in her left knee over the medial joint line with some burning laterally.  This is not controlled well with ibuprofen and partially controlled with hydrocodone.  She is amenable to left total knee arthroplasty with its inherent risks but the benefits outweigh the risk even at age 11 and we discussed this  The procedure has been fully reviewed with the patient; The risks and benefits of surgery have been discussed and explained and understood. Alternative treatment has also been reviewed, questions were encouraged and answered. The postoperative plan is also been  reviewed.    Chief Complaint  Patient presents with  . Knee Pain    c/o constant pain using cane for ambulation  . Post-op Follow-up    s/p left knee arthroscopy / chondral defect left femur 04/27/17    47 year old female status post microfracture and then chondral oats procedure which was done on April 27, 2017 she went back to work in a brace she presents now with some more severe dull aching throbbing medial left knee pain worsening over the last 3 to 4 weeks associated with poor quadriceps function giving way of the left knee.  Specifically asked and denied back pain.    Review of Systems  HENT: Negative.   Cardiovascular: Negative for chest pain.  Musculoskeletal: Negative for back pain.  Skin: Negative.   Neurological: Negative for tingling, tremors, sensory change and focal weakness.   Current Meds  Medication Sig  . acetaminophen (TYLENOL) 500 MG tablet Take 1,000 mg by mouth every 6 (six) hours as needed for moderate pain.   Marland Kitchen ibuprofen (ADVIL,MOTRIN) 200 MG tablet Take 800 mg by mouth every 8 (eight) hours as needed for mild pain or moderate pain.    Allergies  Allergen Reactions  . Flagyl [Metronidazole] Nausea Only  . Hydrocodone Nausea And Vomiting and Other (See Comments)    Can take need to take with medication for  nausea and vomitting     BP (!) 146/78   Pulse 73   Ht 5\' 5"  (1.651 m)   Wt 190 lb (86.2 kg)   BMI 31.62 kg/m   Physical Exam  Constitutional: She is oriented to person, place, and time. She appears well-developed and well-nourished.  Musculoskeletal:       Legs: Neurological: She is alert and oriented to person, place, and time. She displays no atrophy and no tremor. She exhibits normal muscle tone. She displays no seizure activity. Gait abnormal. Coordination normal.  Psychiatric: She has a normal mood and affect. Judgment normal.  Vitals reviewed.      Arther Abbott, MD  08/29/2017 12:35 PM

## 2017-08-30 ENCOUNTER — Ambulatory Visit: Payer: Managed Care, Other (non HMO) | Admitting: Orthopedic Surgery

## 2017-08-30 ENCOUNTER — Telehealth: Payer: Self-pay | Admitting: Orthopedic Surgery

## 2017-08-30 NOTE — Telephone Encounter (Signed)
Patient wants to schedule TKR in June 2nd or 3rd week, what day do you prefer?  I can put in the orders

## 2017-08-30 NOTE — Telephone Encounter (Signed)
Patient called to let Dr. Aline Brochure know she would like to have her surgery scheduled in June. Anytime of the 2nd or 3rd week.  Please advise

## 2017-08-31 ENCOUNTER — Other Ambulatory Visit: Payer: Self-pay | Admitting: Orthopedic Surgery

## 2017-08-31 NOTE — Telephone Encounter (Signed)
3rd week

## 2017-08-31 NOTE — Telephone Encounter (Signed)
Ok, what week are you out of the office in June, you are still blocked out, but I thought we were adding you back in some of the days in June, since you are out in July.

## 2017-09-01 NOTE — Telephone Encounter (Signed)
3rd week is good June 18

## 2017-09-13 ENCOUNTER — Telehealth: Payer: Self-pay | Admitting: Radiology

## 2017-09-13 NOTE — Telephone Encounter (Signed)
Patient has been out of work again since 09/06/17 applied for disability. Without insurance currently but trying to get cobra coverage.   I did provide her with the number for financial aid at [facility].   Left her surgery as scheduled in June. Can we discuss?

## 2017-09-13 NOTE — Telephone Encounter (Signed)
Not without cobra coverage keep on schedule but if no ins will need to cancel

## 2017-09-14 NOTE — Telephone Encounter (Signed)
Noted, but wanted to let you know she did have to resign her job due to the knee pain. She is having a lot of difficulty. She is very anxious as well about it.   She will keep me updated about the status of the insurance.

## 2017-09-28 ENCOUNTER — Encounter: Payer: Self-pay | Admitting: Orthopedic Surgery

## 2017-09-28 NOTE — Telephone Encounter (Signed)
Patient called back to relay that she is working with her insurer, [employer], to verify if it has been re-issued. I relayed we will re-verify on our end as well. States she has questions to ask Amy also.  Ph# [phone]

## 2017-09-29 NOTE — Telephone Encounter (Signed)
Her insurance is active again!! You indicated you can not do her TKR on June 18th due to a scheduling conflict. Where do you want me to put her case ?

## 2017-09-29 NOTE — Telephone Encounter (Signed)
Sending back to you to clear my messages we can look at it Monday

## 2017-10-02 ENCOUNTER — Telehealth: Payer: Self-pay | Admitting: Orthopedic Surgery

## 2017-10-02 DIAGNOSIS — Z9889 Other specified postprocedural states: Secondary | ICD-10-CM

## 2017-10-02 DIAGNOSIS — M1712 Unilateral primary osteoarthritis, left knee: Secondary | ICD-10-CM

## 2017-10-02 DIAGNOSIS — M238X2 Other internal derangements of left knee: Secondary | ICD-10-CM

## 2017-10-02 NOTE — Telephone Encounter (Signed)
Patient called, asking to speak with [name] again regarding surgery. Has questions regarding date of surgery, and has additional information about her insurance. [place]

## 2017-10-02 NOTE — Telephone Encounter (Signed)
Did you have a chance to look at this for me? Please?

## 2017-10-02 NOTE — Telephone Encounter (Signed)
I told her there is a scheduling conflict for June [facility] and surgery can be done on June 27th, she really wants to do it sooner, not later, having to pay $600 per month for her insurance and will not have coverage in July   Can you look at book again to make sure we can not accommodate her before June 27th?

## 2017-10-02 NOTE — Telephone Encounter (Signed)
She has requested refill on Hydrocodone and phenergan She wants dose increase on hydrocodone, but I told her not just before surgery

## 2017-10-03 ENCOUNTER — Other Ambulatory Visit: Payer: Self-pay | Admitting: Radiology

## 2017-10-03 ENCOUNTER — Other Ambulatory Visit: Payer: Self-pay | Admitting: Orthopedic Surgery

## 2017-10-03 DIAGNOSIS — Z9889 Other specified postprocedural states: Secondary | ICD-10-CM

## 2017-10-03 DIAGNOSIS — M1712 Unilateral primary osteoarthritis, left knee: Secondary | ICD-10-CM

## 2017-10-03 DIAGNOSIS — M238X2 Other internal derangements of left knee: Secondary | ICD-10-CM

## 2017-10-03 MED ORDER — HYDROCODONE-ACETAMINOPHEN 5-325 MG PO TABS: 1.0000 | ORAL_TABLET | Freq: Four times a day (QID) | ORAL | 0 refills | Status: DC | PRN

## 2017-10-03 NOTE — Patient Instructions (Signed)
[name]  10/03/2017     @PREFPERIOPPHARMACY @   Your procedure is scheduled on  10/10/2017   Report to [facility] at  615    A.M.  Call this number if you have problems the morning of surgery:  [phone]   Remember:  No food after midnight.  You may drink clear liquids until  12 midnight 10/09/2017 .  Clear liquids allowed are:                    Water, Juice (non-citric and without pulp), Carbonated beverages, Clear Tea, Black Coffee only, Plain Jell-O only, Gatorade and Plain Popsicles only    Take these medicines the morning of surgery with A SIP OF WATER  Hydrocodone, phenergan.    Do not wear jewelry, make-up or nail polish.  Do not wear lotions, powders, or perfumes, or deodorant.  Do not shave 48 hours prior to surgery.  Men may shave face and neck.  Do not bring valuables to the hospital.  [facility] is not responsible for any belongings or valuables.  Contacts, dentures or bridgework may not be worn into surgery.  Leave your suitcase in the car.  After surgery it may be brought to your room.  For patients admitted to the hospital, discharge time will be determined by your treatment team.  Patients discharged the day of surgery will not be allowed to drive home.   Name and phone number of your driver:   family Special instructions:  None  Please read over the following fact sheets that you were given. Pain Booklet, Coughing and Deep Breathing, Blood Transfusion Information, Lab Information, Total Joint Packet, MRSA Information, Surgical Site Infection Prevention, Anesthesia Post-op Instructions and Care and Recovery After Surgery       Total Knee Replacement Total knee replacement is a procedure to replace the knee joint with an artificial (prosthetic) knee joint. The purpose of this surgery is to reduce knee pain and improve knee function. The prosthetic knee joint (prosthesis) may be made of metal, plastic or ceramic. It replaces parts  of the thigh bone (femur), lower leg bone (tibia), and kneecap (patella) that are removed during the procedure. Tell a health care provider about:  Any allergies you have.  All medicines you are taking, including vitamins, herbs, eye drops, creams, and over-the-counter medicines.  Any problems you or family members have had with anesthetic medicines.  Any blood disorders you have.  Any surgeries you have had.  Any medical conditions you have.  Whether you are pregnant or may be pregnant. What are the risks? Generally, this is a safe procedure. However, problems may occur, including:  Infection.  Bleeding.  Allergic reactions to medicines.  Damage to other structures or organs.  Decreased range of motion of the knee.  Instability of the knee.  Loosening of the prosthetic joint.  Knee pain that does not go away (chronic pain).  What happens before the procedure?  Ask your health care provider about: ? Changing or stopping your regular medicines. This is especially important if you are taking diabetes medicines or blood thinners. ? Taking medicines such as aspirin and ibuprofen. These medicines can thin your blood. Do not take these medicines before your procedure if your health care provider instructs you not to.  Have dental care and routine cleanings completed before your procedure. Plan to not have dental work done for 3 months after your  procedure. Germs from anywhere in your body, including your mouth, can travel to your new joint and infect it.  Follow instructions from your health care provider about eating or drinking restrictions.  Ask your health care provider how your surgical site will be marked or identified.  You may be given antibiotic medicine to help prevent infection.  If your health care provider prescribes physical therapy, do exercises as instructed.  Do not use any tobacco products, such as cigarettes, chewing tobacco, or e-cigarettes. If you  need help quitting, ask your health care provider.  You may have a physical exam.  You may have tests, such as: ? X-rays. ? MRI. ? CT scan. ? Bone scans.  You may have a blood or urine sample taken.  Plan to have someone take you home after the procedure.  If you will be going home right after the procedure, plan to have someone with you for at least 24 hours. It is recommended that you have someone to help care for you for at least 4-6 weeks after your procedure. What happens during the procedure?  To reduce your risk of infection: ? Your health care team will wash or sanitize their hands. ? Your skin will be washed with soap.  An IV tube will be inserted into one of your veins.  You will be given one or more of the following: ? A medicine to help you relax (sedative). ? A medicine to numb the area (local anesthetic). ? A medicine to make you fall asleep (general anesthetic). ? A medicine that is injected into your spine to numb the area below and slightly above the injection site (spinal anesthetic). ? A medicine that is injected into an area of your body to numb everything below the injection site (regional anesthetic).  An incision will be made in your knee.  Damaged cartilage and bone will be removed from your femur, tibia, and patella.  Parts of the prosthesis (liners) will be placed over the areas of bone and cartilage that were removed. A metal liner will be placed over your femur, and plastic liners will be placed over your tibia and the underside of your patella.  One or more small tubes (drains) may be placed near your incision to help drain extra fluid from your surgical site.  Your incision will be closed with stitches (sutures), skin glue, or adhesive strips. Medicine may be applied to your incision.  A bandage (dressing) will be placed over your incision. The procedure may vary among health care providers and hospitals. What happens after the  procedure?  Your blood pressure, heart rate, breathing rate, and blood oxygen level will be monitored often until the medicines you were given have worn off.  You may continue to receive fluids and medicines through an IV tube.  You will have some pain. Pain medicines will be available to help you.  You may have fluid coming from one or more drains in your incision.  You may have to wear compression stockings. These stockings help to prevent blood clots and reduce swelling in your legs.  You will be encouraged to move around as much as possible.  You may be given a continuous passive motion machine to use at home. You will be shown how to use this machine.  Do not drive for 24 hours if you received a sedative. This information is not intended to replace advice given to you by your health care provider. Make sure you discuss any questions you have  with your health care provider. Document Released: 07/18/2000 Document Revised: 05/14/2016 Document Reviewed: 03/18/2015 Elsevier Interactive Patient Education  2018 [place].  Total Knee Replacement, Care After Refer to this sheet in the next few weeks. These instructions provide you with information about caring for yourself after your procedure. Your health care provider may also give you more specific instructions. Your treatment has been planned according to current medical practices, but problems sometimes occur. Call your health care provider if you have any problems or questions after your procedure. What can I expect after the procedure? After the procedure, it is common to have:  Pain and swelling.  A small amount of blood or clear fluid coming from your incision.  Limited range of motion.  Follow these instructions at home: Medicines  Take over-the-counter and prescription medicines only as told by your health care provider.  If you were prescribed an antibiotic medicine, take it as told by your health care provider. Do  not stop taking the antibiotic even if you start to feel better.  If you were prescribed a blood thinner (anticoagulant), take it as told by your health care provider. If you have a splint or brace:  Wear the immobilizer as told by your health care provider. Remove it only as told by your health care provider.  Loosen the immobilizer if your toes tingle, become numb, or turn cold and blue.  Do not let your immobilizer get wet if it is not waterproof.  Keep the immobilizer clean. Bathing   Do not take baths, swim, or use a hot tub until your health care provider approves. Ask your health care provider if you can take showers. You may only be allowed to take sponge baths for bathing.  If you have an immobilizer that is not waterproof, cover it with a watertight covering when you take a bath or shower.  Keep your bandage (dressing) dry until your health care provider says it can be removed. Incision care and drain care  Check your incision area and drain site every day for signs of infection. Check for: ? More redness, swelling, or pain. ? More fluid or blood. ? Warmth. ? Pus or a bad smell.  Follow instructions from your health care provider about how to take care of your incision. Make sure you: ? Wash your hands with soap and water before you change your dressing. If soap and water are not available, use hand sanitizer. ? Change your dressing as told by your health care provider. ? Leave stitches (sutures), skin glue, or adhesive strips in place. These skin closures may need to stay in place for 2 weeks or longer. If adhesive strip edges start to loosen and curl up, you may trim the loose edges. Do not remove adhesive strips completely unless your health care provider tells you to do that.  If you have a drain, follow instructions from your health care provider about caring for it. Do not remove the drain tube or any dressings around the tube opening unless your health care provider  approves. Managing pain, stiffness, and swelling  If directed, put ice on your knee. ? Put ice in a plastic bag. ? Place a towel between your skin and the bag. ? Leave the ice on for 20 minutes, 2-3 times per day.  If directed, apply heat to the affected area as often as told by your health care provider. Use the heat source that your health care provider recommends, such as a moist heat pack  or a heating pad. ? Place a towel between your skin and the heat source. ? Leave the heat on for 20-30 minutes. ? Remove the heat if your skin turns bright red. This is especially important if you are unable to feel pain, heat, or cold. You may have a greater risk of getting burned.  Move your toes often to avoid stiffness and to lessen swelling.  Raise (elevate) your knee above the level of your heart while you are sitting or lying down.  Wear elastic knee support for as long as told by your health care provider. Driving   Do not drive until your health care provider approves. Ask your health care provider when it is safe to drive if you have an immobilizer on your knee.  Do not drive or operate heavy machinery while taking prescription pain medicine.  Do not drive for 24 hours if you received a sedative. Activity  Do not lift anything that is heavier than 10 lb (4.5 kg) until your health care provider approves.  Do not play contact sports until your health care provider approves.  Avoid high-impact activities, including running, jumping rope, and jumping jacks.  Avoid sitting for a long time without moving. Get up and move around at least every few hours.  If physical therapy was prescribed, do exercises as told by your health care provider.  Return to your normal activities as told by your health care provider. Ask your health care provider what activities are safe for you. Safety  Do not use your leg to support your body weight until your health care provider approves. Use crutches  or a walker as told by your health care provider. General instructions   Do not have any dental work done for at least 3 months after your surgery. When you do have dental work done, tell your dentist about your joint replacement.  Do not use any tobacco products, such as cigarettes, chewing tobacco, or e-cigarettes. If you need help quitting, ask your health care provider.  Wear compression stockings as told by your health care provider. These stockings help to prevent blood clots and reduce swelling in your legs.  If you have been sent home with a continuous passive motion machine, use it as told by your health care provider.  Drink enough fluid to keep your urine clear or pale yellow.  If you have been instructed to lose weight, follow instructions from your health care provider about how to do this safely.  Keep all follow-up visits as told by your health care provider. This is important. Contact a health care provider if:  You have more redness, swelling, or pain around your incision or drain.  You have more fluid or blood coming from your incision or drain.  Your incision or drain site feels warm to the touch.  You have pus or a bad smell coming from your incision or drain.  You have a fever.  Your incision breaks open after your health care provider removes your sutures, skin glue, or adhesive tape.  Your prosthesis feels loose.  You have knee pain that does not go away. Get help right away if:  You have a rash.  You have pain or swelling in your calf or thigh.  You have shortness of breath or difficulty breathing.  You have chest pain.  Your range of motion in your knee is getting worse. This information is not intended to replace advice given to you by your health care provider. Make sure  you discuss any questions you have with your health care provider. Document Released: 10/29/2004 Document Revised: 12/14/2015 Document Reviewed: 03/18/2015 Elsevier  Interactive Patient Education  2018 [place].  Spinal Anesthesia and Epidural Anesthesia Spinal anesthesia and epidural anesthesia are methods of numbing the body. They are done by injecting numbing medicine (anesthetic) into the back, near the spinal cord. Spinal anesthesia is usually done to numb an area at and below the place where the injection is made. It is often used during surgeries of the pelvis, hips, legs, and lower abdomen. It begins to work almost immediately. Epidural anesthesia may be done to numb an area above or below the area where the injection is made. It is often used during childbirth and after major abdominal or chest surgery. It begins to work after 10-20 minutes. Tell a health care provider about:  Any allergies you have.  All medicines you are taking, including vitamins, herbs, eye drops, creams, and over-the-counter medicines.  Any problems you or family members have had with anesthetic medicines.  Any blood disorders you have.  Any surgeries you have had.  Any medical conditions you have.  Whether you are pregnant or may be pregnant.  Any recent alcohol, tobacco, or drug use. What are the risks? Generally, this is a safe procedure. However, problems may occur, including:  Headache.  A drop in blood pressure. In some cases, this can lead to a heart attack or a stroke.  Nerve damage.  Infection.  Allergic reaction to medicines.  Seizures.  Spinal fluid leak.  Bleeding around the injection site.  Inability to breathe. If this happens, a tube may be put into your windpipe (trachea) and a machine may be used to help you breathe until the anesthetic wears off.  Long-lasting numbness, pain, or loss of function of body parts.  Nausea and vomiting.  Dizziness and fainting.  Shivering.  Itching.  What happens before the procedure? Staying hydrated Follow instructions from your health care provider about hydration, which may include:  Up  to 2 hours before the procedure - you may continue to drink clear liquids, such as water, clear fruit juice, black coffee, and plain tea.  Eating and drinking restrictions Follow instructions from your health care provider about eating and drinking, which may include:  8 hours before the procedure - stop eating heavy meals or foods such as meat, fried foods, or fatty foods.  6 hours before the procedure - stop eating light meals or foods, such as toast or cereal.  6 hours before the procedure - stop drinking milk or drinks that contain milk.  2 hours before the procedure - stop drinking clear liquids.  Medicine Ask your health care provider about:  Changing or stopping your regular medicines. This is especially important if you are taking diabetes medicines or blood thinners.  Changing or stopping your dietary supplements.  Taking medicines such as aspirin and ibuprofen. These medicines can thin your blood. Do not take these medicines before your procedure if your health care provider instructs you not to.  General instructions   Do not use any tobacco products, such as cigarettes, chewing tobacco, and electronic cigarettes, for as long as possible before your procedure. If you need help quitting, ask your health care provider.  Ask your health care provider if you will have to stay overnight at the hospital or clinic.  If you will not have to stay overnight: ? Plan to have someone take you home from the hospital or clinic. ?  Plan to have someone with you for 24 hours. What happens during the procedure?  A health care provider will put patches on your chest, a cuff around your arm, or a sensor device on your finger. These will be attached to monitors that allow your health care provider to watch your blood pressure, pulse, and oxygen levels to make sure that the anesthetic does not cause any problems.  An IV tube may be inserted into one of your veins. The tube will be used to  give you fluids and medicines throughout the procedure as needed.  You may be given a medicine to help you relax (sedative).  You will be asked to sit up or to lie on your side with your knees and your chin bent toward your chest. These positions open up the space between the bones in your back, making it easier to inject the medicine.  The area of your back where the medicine will be injected will be cleaned.  A medicine called a local anesthetic may be injected to numb the area where the spinal or epidural anesthetic will be injected.  A needle will be inserted between the bones of your back. While this is being done: ? Continue to breathe normally. ? Stay as still and quiet as you can. ? If you feel a tingling shock or pain going down your leg, tell your health care provider but try not to move.  The spinal or epidural anesthetic will be injected.  If you receive an epidural anesthetic and need more than one dose, a tiny, flexible tube (catheter) will be placed where the anesthetic was injected. Additional doses will be given through the catheter. If you need pain medicine after the procedure, the catheter will be kept in place.  If an epidural catheter has not been placed in your injection site or left there, a small bandage (dressing) will be placed over the injection site. The procedure may vary among health care providers and hospitals. What happens after the procedure?  You will need to stay in bed until your health care provider says it safe for you to walk.  Your blood pressure, heart rate, breathing rate, and blood oxygen level will be monitored until the medicines you were given have worn off.  If you have a catheter, it will be removed when it is no longer needed.  Do not drive for 24 hours if you received a sedative.  It is common to have nausea and itching. There are medicines that can help with these side effects. It is also common to  have: ? Sleepiness. ? Vomiting. ? Numbness or tingling in your legs. ? Trouble urinating. This information is not intended to replace advice given to you by your health care provider. Make sure you discuss any questions you have with your health care provider. Document Released: 07/02/2003 Document Revised: 09/22/2015 Document Reviewed: 08/03/2015 Elsevier Interactive Patient Education  2018 [employer].  Spinal Anesthesia and Epidural Anesthesia, Care After These instructions give you information about caring for yourself after your procedure. Your doctor may also give you more specific instructions. Call your doctor if you have any problems or questions after your procedure. Follow these instructions at home: For at least 24 hours after the procedure:   Do not: ? Do activities where you could fall or get hurt (injured). ? Drive. ? Use heavy machinery. ? Drink alcohol. ? Take sleeping pills or medicines that make you sleepy (drowsy). ? Make important decisions. ? Sign legal  documents. ? Take care of children on your own.  Rest. Eating and drinking  If you throw up (vomit), drink water, juice, or soup when you can drink without throwing up.  Make sure you do not feel like throwing up (are not nauseous) before you eat.  Follow the diet that is recommended by your doctor. General instructions  Have a responsible adult stay with you until you are awake and alert.  Take over-the-counter and prescription medicines only as told by your doctor.  If you smoke, do not smoke unless an adult is watching.  Keep all follow-up visits as told by your doctor. This is important. Contact a doctor if:  It has been more than one day since your procedure and you feel like throwing up.  It has been more than one day since your procedure and you throw up.  You have a rash. Get help right away if:  You have a fever.  You have a headache that lasts a long time.  You have a very bad  headache.  Your vision is blurry.  You see two of a single object (double vision).  You are dizzy or light-headed.  You faint.  Your arms or legs tingle, feel weak, or get numb.  You have trouble breathing.  You cannot pee (urinate). This information is not intended to replace advice given to you by your health care provider. Make sure you discuss any questions you have with your health care provider. Document Released: 08/03/2015 Document Revised: 12/03/2015 Document Reviewed: 08/03/2015 Elsevier Interactive Patient Education  2018 [place] Anesthesia, Adult General anesthesia is the use of medicines to make a person "go to sleep" (be unconscious) for a medical procedure. General anesthesia is often recommended when a procedure:  Is long.  Requires you to be still or in an unusual position.  Is major and can cause you to lose blood.  Is impossible to do without general anesthesia.  The medicines used for general anesthesia are called general anesthetics. In addition to making you sleep, the medicines:  Prevent pain.  Control your blood pressure.  Relax your muscles.  Tell a health care provider about:  Any allergies you have.  All medicines you are taking, including vitamins, herbs, eye drops, creams, and over-the-counter medicines.  Any problems you or family members have had with anesthetic medicines.  Types of anesthetics you have had in the past.  Any bleeding disorders you have.  Any surgeries you have had.  Any medical conditions you have.  Any history of heart or lung conditions, such as heart failure, sleep apnea, or chronic obstructive pulmonary disease (COPD).  Whether you are pregnant or may be pregnant.  Whether you use tobacco, alcohol, marijuana, or street drugs.  Any history of Armed forces logistics/support/administrative officer.  Any history of depression or anxiety. What are the risks? Generally, this is a safe procedure. However, problems may occur,  including:  Allergic reaction to anesthetics.  Lung and heart problems.  Inhaling food or liquids from your stomach into your lungs (aspiration).  Injury to nerves.  Waking up during your procedure and being unable to move (rare).  Extreme agitation or a state of mental confusion (delirium) when you wake up from the anesthetic.  Air in the bloodstream, which can lead to stroke.  These problems are more likely to develop if you are having a major surgery or if you have an advanced medical condition. You can prevent some of these complications by answering all of  your health care provider's questions thoroughly and by following all pre-procedure instructions. General anesthesia can cause side effects, including:  Nausea or vomiting  A sore throat from the breathing tube.  Feeling cold or shivery.  Feeling tired, washed out, or achy.  Sleepiness or drowsiness.  Confusion or agitation.  What happens before the procedure? Staying hydrated Follow instructions from your health care provider about hydration, which may include:  Up to 2 hours before the procedure - you may continue to drink clear liquids, such as water, clear fruit juice, black coffee, and plain tea.  Eating and drinking restrictions Follow instructions from your health care provider about eating and drinking, which may include:  8 hours before the procedure - stop eating heavy meals or foods such as meat, fried foods, or fatty foods.  6 hours before the procedure - stop eating light meals or foods, such as toast or cereal.  6 hours before the procedure - stop drinking milk or drinks that contain milk.  2 hours before the procedure - stop drinking clear liquids.  Medicines  Ask your health care provider about: ? Changing or stopping your regular medicines. This is especially important if you are taking diabetes medicines or blood thinners. ? Taking medicines such as aspirin and ibuprofen. These medicines  can thin your blood. Do not take these medicines before your procedure if your health care provider instructs you not to. ? Taking new dietary supplements or medicines. Do not take these during the week before your procedure unless your health care provider approves them.  If you are told to take a medicine or to continue taking a medicine on the day of the procedure, take the medicine with sips of water. General instructions   Ask if you will be going home the same day, the following day, or after a longer hospital stay. ? Plan to have someone take you home. ? Plan to have someone stay with you for the first 24 hours after you leave the hospital or clinic.  For 3-6 weeks before the procedure, try not to use any tobacco products, such as cigarettes, chewing tobacco, and e-cigarettes.  You may brush your teeth on the morning of the procedure, but make sure to spit out the toothpaste. What happens during the procedure?  You will be given anesthetics through a mask and through an IV tube in one of your veins.  You may receive medicine to help you relax (sedative).  As soon as you are asleep, a breathing tube may be used to help you breathe.  An anesthesia specialist will stay with you throughout the procedure. He or she will help keep you comfortable and safe by continuing to give you medicines and adjusting the amount of medicine that you get. He or she will also watch your blood pressure, pulse, and oxygen levels to make sure that the anesthetics do not cause any problems.  If a breathing tube was used to help you breathe, it will be removed before you wake up. The procedure may vary among health care providers and hospitals. What happens after the procedure?  You will wake up, often slowly, after the procedure is complete, usually in a recovery area.  Your blood pressure, heart rate, breathing rate, and blood oxygen level will be monitored until the medicines you were given have worn  off.  You may be given medicine to help you calm down if you feel anxious or agitated.  If you will be going home the same  day, your health care provider may check to make sure you can stand, drink, and urinate.  Your health care providers will treat your pain and side effects before you go home.  Do not drive for 24 hours if you received a sedative.  You may: ? Feel nauseous and vomit. ? Have a sore throat. ? Have mental slowness. ? Feel cold or shivery. ? Feel sleepy. ? Feel tired. ? Feel sore or achy, even in parts of your body where you did not have surgery. This information is not intended to replace advice given to you by your health care provider. Make sure you discuss any questions you have with your health care provider. Document Released: 07/19/2007 Document Revised: 09/22/2015 Document Reviewed: 03/26/2015 [employer] Interactive Patient Education  2018 [place] [facility], Adult, Care After These instructions provide you with information about caring for yourself after your procedure. Your health care provider may also give you more specific instructions. Your treatment has been planned according to current medical practices, but problems sometimes occur. Call your health care provider if you have any problems or questions after your procedure. What can I expect after the procedure? After the procedure, it is common to have:  Vomiting.  A sore throat.  Mental slowness.  It is common to feel:  Nauseous.  Cold or shivery.  Sleepy.  Tired.  Sore or achy, even in parts of your body where you did not have surgery.  Follow these instructions at home: For at least 24 hours after the procedure:  Do not: ? Participate in activities where you could fall or become injured. ? Drive. ? Use heavy machinery. ? Drink alcohol. ? Take sleeping pills or medicines that cause drowsiness. ? Make important decisions or sign legal documents. ? Take care of  children on your own.  Rest. Eating and drinking  If you vomit, drink water, juice, or soup when you can drink without vomiting.  Drink enough fluid to keep your urine clear or pale yellow.  Make sure you have little or no nausea before eating solid foods.  Follow the diet recommended by your health care provider. General instructions  Have a responsible adult stay with you until you are awake and alert.  Return to your normal activities as told by your health care provider. Ask your health care provider what activities are safe for you.  Take over-the-counter and prescription medicines only as told by your health care provider.  If you smoke, do not smoke without supervision.  Keep all follow-up visits as told by your health care provider. This is important. Contact a health care provider if:  You continue to have nausea or vomiting at home, and medicines are not helpful.  You cannot drink fluids or start eating again.  You cannot urinate after 8-12 hours.  You develop a skin rash.  You have fever.  You have increasing redness at the site of your procedure. Get help right away if:  You have difficulty breathing.  You have chest pain.  You have unexpected bleeding.  You feel that you are having a life-threatening or urgent problem. This information is not intended to replace advice given to you by your health care provider. Make sure you discuss any questions you have with your health care provider. Document Released: 07/18/2000 Document Revised: 09/14/2015 Document Reviewed: 03/26/2015 [employer] Interactive Patient Education  [employer].

## 2017-10-05 ENCOUNTER — Inpatient Hospital Stay (HOSPITAL_COMMUNITY)
Admission: RE | Admit: 2017-10-05 | Discharge: 2017-10-05 | Disposition: A | Discharge status: Home / Self Care | Payer: Self-pay | Source: Ambulatory Visit

## 2017-10-09 NOTE — Patient Instructions (Signed)
[name]  10/09/2017     @PREFPERIOPPHARMACY @   Your procedure is scheduled on  10/17/2017   Report to [facility] at  1100   A.M.  Call this number if you have problems the morning of surgery:  [phone]   Remember:  Do not eat or drink after midnight.  You may drink clear liquids until  12 midnight 10/16/2017 .  Clear liquids allowed are:                    Water, Juice (non-citric and without pulp), Carbonated beverages, Clear Tea, Black Coffee only, Plain Jell-O only, Gatorade and Plain Popsicles only    Take these medicines the morning of surgery with A SIP OF WATER  Hydrocodone, phenergan.    Do not wear jewelry, make-up or nail polish.  Do not wear lotions, powders, or perfumes, or deodorant.  Do not shave 48 hours prior to surgery.  Men may shave face and neck.  Do not bring valuables to the hospital.  [facility] is not responsible for any belongings or valuables.  Contacts, dentures or bridgework may not be worn into surgery.  Leave your suitcase in the car.  After surgery it may be brought to your room.  For patients admitted to the hospital, discharge time will be determined by your treatment team.  Patients discharged the day of surgery will not be allowed to drive home.   Name and phone number of your driver:   family Special instructions:  None  Please read over the following fact sheets that you were given. Pain Booklet, Coughing and Deep Breathing, Blood Transfusion Information, Lab Information, Total Joint Packet, MRSA Information, Surgical Site Infection Prevention, Anesthesia Post-op Instructions and Care and Recovery After Surgery       Total Knee Replacement Total knee replacement is a procedure to replace the knee joint with an artificial (prosthetic) knee joint. The purpose of this surgery is to reduce knee pain and improve knee function. The prosthetic knee joint (prosthesis) may be made of metal, plastic or ceramic. It  replaces parts of the thigh bone (femur), lower leg bone (tibia), and kneecap (patella) that are removed during the procedure. Tell a health care provider about:  Any allergies you have.  All medicines you are taking, including vitamins, herbs, eye drops, creams, and over-the-counter medicines.  Any problems you or family members have had with anesthetic medicines.  Any blood disorders you have.  Any surgeries you have had.  Any medical conditions you have.  Whether you are pregnant or may be pregnant. What are the risks? Generally, this is a safe procedure. However, problems may occur, including:  Infection.  Bleeding.  Allergic reactions to medicines.  Damage to other structures or organs.  Decreased range of motion of the knee.  Instability of the knee.  Loosening of the prosthetic joint.  Knee pain that does not go away (chronic pain).  What happens before the procedure?  Ask your health care provider about: ? Changing or stopping your regular medicines. This is especially important if you are taking diabetes medicines or blood thinners. ? Taking medicines such as aspirin and ibuprofen. These medicines can thin your blood. Do not take these medicines before your procedure if your health care provider instructs you not to.  Have dental care and routine cleanings completed before your procedure. Plan to not have dental work done for 3  months after your procedure. Germs from anywhere in your body, including your mouth, can travel to your new joint and infect it.  Follow instructions from your health care provider about eating or drinking restrictions.  Ask your health care provider how your surgical site will be marked or identified.  You may be given antibiotic medicine to help prevent infection.  If your health care provider prescribes physical therapy, do exercises as instructed.  Do not use any tobacco products, such as cigarettes, chewing tobacco, or  e-cigarettes. If you need help quitting, ask your health care provider.  You may have a physical exam.  You may have tests, such as: ? X-rays. ? MRI. ? CT scan. ? Bone scans.  You may have a blood or urine sample taken.  Plan to have someone take you home after the procedure.  If you will be going home right after the procedure, plan to have someone with you for at least 24 hours. It is recommended that you have someone to help care for you for at least 4-6 weeks after your procedure. What happens during the procedure?  To reduce your risk of infection: ? Your health care team will wash or sanitize their hands. ? Your skin will be washed with soap.  An IV tube will be inserted into one of your veins.  You will be given one or more of the following: ? A medicine to help you relax (sedative). ? A medicine to numb the area (local anesthetic). ? A medicine to make you fall asleep (general anesthetic). ? A medicine that is injected into your spine to numb the area below and slightly above the injection site (spinal anesthetic). ? A medicine that is injected into an area of your body to numb everything below the injection site (regional anesthetic).  An incision will be made in your knee.  Damaged cartilage and bone will be removed from your femur, tibia, and patella.  Parts of the prosthesis (liners) will be placed over the areas of bone and cartilage that were removed. A metal liner will be placed over your femur, and plastic liners will be placed over your tibia and the underside of your patella.  One or more small tubes (drains) may be placed near your incision to help drain extra fluid from your surgical site.  Your incision will be closed with stitches (sutures), skin glue, or adhesive strips. Medicine may be applied to your incision.  A bandage (dressing) will be placed over your incision. The procedure may vary among health care providers and hospitals. What happens after  the procedure?  Your blood pressure, heart rate, breathing rate, and blood oxygen level will be monitored often until the medicines you were given have worn off.  You may continue to receive fluids and medicines through an IV tube.  You will have some pain. Pain medicines will be available to help you.  You may have fluid coming from one or more drains in your incision.  You may have to wear compression stockings. These stockings help to prevent blood clots and reduce swelling in your legs.  You will be encouraged to move around as much as possible.  You may be given a continuous passive motion machine to use at home. You will be shown how to use this machine.  Do not drive for 24 hours if you received a sedative. This information is not intended to replace advice given to you by your health care provider. Make sure you discuss any  questions you have with your health care provider. Document Released: 07/18/2000 Document Revised: 05/14/2016 Document Reviewed: 03/18/2015 Elsevier Interactive Patient Education  2018 [place].  Total Knee Replacement, Care After Refer to this sheet in the next few weeks. These instructions provide you with information about caring for yourself after your procedure. Your health care provider may also give you more specific instructions. Your treatment has been planned according to current medical practices, but problems sometimes occur. Call your health care provider if you have any problems or questions after your procedure. What can I expect after the procedure? After the procedure, it is common to have:  Pain and swelling.  A small amount of blood or clear fluid coming from your incision.  Limited range of motion.  Follow these instructions at home: Medicines  Take over-the-counter and prescription medicines only as told by your health care provider.  If you were prescribed an antibiotic medicine, take it as told by your health care provider.  Do not stop taking the antibiotic even if you start to feel better.  If you were prescribed a blood thinner (anticoagulant), take it as told by your health care provider. If you have a splint or brace:  Wear the immobilizer as told by your health care provider. Remove it only as told by your health care provider.  Loosen the immobilizer if your toes tingle, become numb, or turn cold and blue.  Do not let your immobilizer get wet if it is not waterproof.  Keep the immobilizer clean. Bathing   Do not take baths, swim, or use a hot tub until your health care provider approves. Ask your health care provider if you can take showers. You may only be allowed to take sponge baths for bathing.  If you have an immobilizer that is not waterproof, cover it with a watertight covering when you take a bath or shower.  Keep your bandage (dressing) dry until your health care provider says it can be removed. Incision care and drain care  Check your incision area and drain site every day for signs of infection. Check for: ? More redness, swelling, or pain. ? More fluid or blood. ? Warmth. ? Pus or a bad smell.  Follow instructions from your health care provider about how to take care of your incision. Make sure you: ? Wash your hands with soap and water before you change your dressing. If soap and water are not available, use hand sanitizer. ? Change your dressing as told by your health care provider. ? Leave stitches (sutures), skin glue, or adhesive strips in place. These skin closures may need to stay in place for 2 weeks or longer. If adhesive strip edges start to loosen and curl up, you may trim the loose edges. Do not remove adhesive strips completely unless your health care provider tells you to do that.  If you have a drain, follow instructions from your health care provider about caring for it. Do not remove the drain tube or any dressings around the tube opening unless your health care  provider approves. Managing pain, stiffness, and swelling  If directed, put ice on your knee. ? Put ice in a plastic bag. ? Place a towel between your skin and the bag. ? Leave the ice on for 20 minutes, 2-3 times per day.  If directed, apply heat to the affected area as often as told by your health care provider. Use the heat source that your health care provider recommends, such as a  moist heat pack or a heating pad. ? Place a towel between your skin and the heat source. ? Leave the heat on for 20-30 minutes. ? Remove the heat if your skin turns bright red. This is especially important if you are unable to feel pain, heat, or cold. You may have a greater risk of getting burned.  Move your toes often to avoid stiffness and to lessen swelling.  Raise (elevate) your knee above the level of your heart while you are sitting or lying down.  Wear elastic knee support for as long as told by your health care provider. Driving   Do not drive until your health care provider approves. Ask your health care provider when it is safe to drive if you have an immobilizer on your knee.  Do not drive or operate heavy machinery while taking prescription pain medicine.  Do not drive for 24 hours if you received a sedative. Activity  Do not lift anything that is heavier than 10 lb (4.5 kg) until your health care provider approves.  Do not play contact sports until your health care provider approves.  Avoid high-impact activities, including running, jumping rope, and jumping jacks.  Avoid sitting for a long time without moving. Get up and move around at least every few hours.  If physical therapy was prescribed, do exercises as told by your health care provider.  Return to your normal activities as told by your health care provider. Ask your health care provider what activities are safe for you. Safety  Do not use your leg to support your body weight until your health care provider approves. Use  crutches or a walker as told by your health care provider. General instructions   Do not have any dental work done for at least 3 months after your surgery. When you do have dental work done, tell your dentist about your joint replacement.  Do not use any tobacco products, such as cigarettes, chewing tobacco, or e-cigarettes. If you need help quitting, ask your health care provider.  Wear compression stockings as told by your health care provider. These stockings help to prevent blood clots and reduce swelling in your legs.  If you have been sent home with a continuous passive motion machine, use it as told by your health care provider.  Drink enough fluid to keep your urine clear or pale yellow.  If you have been instructed to lose weight, follow instructions from your health care provider about how to do this safely.  Keep all follow-up visits as told by your health care provider. This is important. Contact a health care provider if:  You have more redness, swelling, or pain around your incision or drain.  You have more fluid or blood coming from your incision or drain.  Your incision or drain site feels warm to the touch.  You have pus or a bad smell coming from your incision or drain.  You have a fever.  Your incision breaks open after your health care provider removes your sutures, skin glue, or adhesive tape.  Your prosthesis feels loose.  You have knee pain that does not go away. Get help right away if:  You have a rash.  You have pain or swelling in your calf or thigh.  You have shortness of breath or difficulty breathing.  You have chest pain.  Your range of motion in your knee is getting worse. This information is not intended to replace advice given to you by your health care  provider. Make sure you discuss any questions you have with your health care provider. Document Released: 10/29/2004 Document Revised: 12/14/2015 Document Reviewed: 03/18/2015 Elsevier  Interactive Patient Education  2018 [place].  Spinal Anesthesia and Epidural Anesthesia Spinal anesthesia and epidural anesthesia are methods of numbing the body. They are done by injecting numbing medicine (anesthetic) into the back, near the spinal cord. Spinal anesthesia is usually done to numb an area at and below the place where the injection is made. It is often used during surgeries of the pelvis, hips, legs, and lower abdomen. It begins to work almost immediately. Epidural anesthesia may be done to numb an area above or below the area where the injection is made. It is often used during childbirth and after major abdominal or chest surgery. It begins to work after 10-20 minutes. Tell a health care provider about:  Any allergies you have.  All medicines you are taking, including vitamins, herbs, eye drops, creams, and over-the-counter medicines.  Any problems you or family members have had with anesthetic medicines.  Any blood disorders you have.  Any surgeries you have had.  Any medical conditions you have.  Whether you are pregnant or may be pregnant.  Any recent alcohol, tobacco, or drug use. What are the risks? Generally, this is a safe procedure. However, problems may occur, including:  Headache.  A drop in blood pressure. In some cases, this can lead to a heart attack or a stroke.  Nerve damage.  Infection.  Allergic reaction to medicines.  Seizures.  Spinal fluid leak.  Bleeding around the injection site.  Inability to breathe. If this happens, a tube may be put into your windpipe (trachea) and a machine may be used to help you breathe until the anesthetic wears off.  Long-lasting numbness, pain, or loss of function of body parts.  Nausea and vomiting.  Dizziness and fainting.  Shivering.  Itching.  What happens before the procedure? Staying hydrated Follow instructions from your health care provider about hydration, which may include:  Up  to 2 hours before the procedure - you may continue to drink clear liquids, such as water, clear fruit juice, black coffee, and plain tea.  Eating and drinking restrictions Follow instructions from your health care provider about eating and drinking, which may include:  8 hours before the procedure - stop eating heavy meals or foods such as meat, fried foods, or fatty foods.  6 hours before the procedure - stop eating light meals or foods, such as toast or cereal.  6 hours before the procedure - stop drinking milk or drinks that contain milk.  2 hours before the procedure - stop drinking clear liquids.  Medicine Ask your health care provider about:  Changing or stopping your regular medicines. This is especially important if you are taking diabetes medicines or blood thinners.  Changing or stopping your dietary supplements.  Taking medicines such as aspirin and ibuprofen. These medicines can thin your blood. Do not take these medicines before your procedure if your health care provider instructs you not to.  General instructions   Do not use any tobacco products, such as cigarettes, chewing tobacco, and electronic cigarettes, for as long as possible before your procedure. If you need help quitting, ask your health care provider.  Ask your health care provider if you will have to stay overnight at the hospital or clinic.  If you will not have to stay overnight: ? Plan to have someone take you home from the hospital  or clinic. ? Plan to have someone with you for 24 hours. What happens during the procedure?  A health care provider will put patches on your chest, a cuff around your arm, or a sensor device on your finger. These will be attached to monitors that allow your health care provider to watch your blood pressure, pulse, and oxygen levels to make sure that the anesthetic does not cause any problems.  An IV tube may be inserted into one of your veins. The tube will be used to  give you fluids and medicines throughout the procedure as needed.  You may be given a medicine to help you relax (sedative).  You will be asked to sit up or to lie on your side with your knees and your chin bent toward your chest. These positions open up the space between the bones in your back, making it easier to inject the medicine.  The area of your back where the medicine will be injected will be cleaned.  A medicine called a local anesthetic may be injected to numb the area where the spinal or epidural anesthetic will be injected.  A needle will be inserted between the bones of your back. While this is being done: ? Continue to breathe normally. ? Stay as still and quiet as you can. ? If you feel a tingling shock or pain going down your leg, tell your health care provider but try not to move.  The spinal or epidural anesthetic will be injected.  If you receive an epidural anesthetic and need more than one dose, a tiny, flexible tube (catheter) will be placed where the anesthetic was injected. Additional doses will be given through the catheter. If you need pain medicine after the procedure, the catheter will be kept in place.  If an epidural catheter has not been placed in your injection site or left there, a small bandage (dressing) will be placed over the injection site. The procedure may vary among health care providers and hospitals. What happens after the procedure?  You will need to stay in bed until your health care provider says it safe for you to walk.  Your blood pressure, heart rate, breathing rate, and blood oxygen level will be monitored until the medicines you were given have worn off.  If you have a catheter, it will be removed when it is no longer needed.  Do not drive for 24 hours if you received a sedative.  It is common to have nausea and itching. There are medicines that can help with these side effects. It is also common to  have: ? Sleepiness. ? Vomiting. ? Numbness or tingling in your legs. ? Trouble urinating. This information is not intended to replace advice given to you by your health care provider. Make sure you discuss any questions you have with your health care provider. Document Released: 07/02/2003 Document Revised: 09/22/2015 Document Reviewed: 08/03/2015 Elsevier Interactive Patient Education  2018 [employer].  Spinal Anesthesia and Epidural Anesthesia, Care After These instructions give you information about caring for yourself after your procedure. Your doctor may also give you more specific instructions. Call your doctor if you have any problems or questions after your procedure. Follow these instructions at home: For at least 24 hours after the procedure:   Do not: ? Do activities where you could fall or get hurt (injured). ? Drive. ? Use heavy machinery. ? Drink alcohol. ? Take sleeping pills or medicines that make you sleepy (drowsy). ? Make important decisions. ?  Sign legal documents. ? Take care of children on your own.  Rest. Eating and drinking  If you throw up (vomit), drink water, juice, or soup when you can drink without throwing up.  Make sure you do not feel like throwing up (are not nauseous) before you eat.  Follow the diet that is recommended by your doctor. General instructions  Have a responsible adult stay with you until you are awake and alert.  Take over-the-counter and prescription medicines only as told by your doctor.  If you smoke, do not smoke unless an adult is watching.  Keep all follow-up visits as told by your doctor. This is important. Contact a doctor if:  It has been more than one day since your procedure and you feel like throwing up.  It has been more than one day since your procedure and you throw up.  You have a rash. Get help right away if:  You have a fever.  You have a headache that lasts a long time.  You have a very bad  headache.  Your vision is blurry.  You see two of a single object (double vision).  You are dizzy or light-headed.  You faint.  Your arms or legs tingle, feel weak, or get numb.  You have trouble breathing.  You cannot pee (urinate). This information is not intended to replace advice given to you by your health care provider. Make sure you discuss any questions you have with your health care provider. Document Released: 08/03/2015 Document Revised: 12/03/2015 Document Reviewed: 08/03/2015 Elsevier Interactive Patient Education  2018 [place] Anesthesia, Adult General anesthesia is the use of medicines to make a person "go to sleep" (be unconscious) for a medical procedure. General anesthesia is often recommended when a procedure:  Is long.  Requires you to be still or in an unusual position.  Is major and can cause you to lose blood.  Is impossible to do without general anesthesia.  The medicines used for general anesthesia are called general anesthetics. In addition to making you sleep, the medicines:  Prevent pain.  Control your blood pressure.  Relax your muscles.  Tell a health care provider about:  Any allergies you have.  All medicines you are taking, including vitamins, herbs, eye drops, creams, and over-the-counter medicines.  Any problems you or family members have had with anesthetic medicines.  Types of anesthetics you have had in the past.  Any bleeding disorders you have.  Any surgeries you have had.  Any medical conditions you have.  Any history of heart or lung conditions, such as heart failure, sleep apnea, or chronic obstructive pulmonary disease (COPD).  Whether you are pregnant or may be pregnant.  Whether you use tobacco, alcohol, marijuana, or street drugs.  Any history of Armed forces logistics/support/administrative officer.  Any history of depression or anxiety. What are the risks? Generally, this is a safe procedure. However, problems may occur,  including:  Allergic reaction to anesthetics.  Lung and heart problems.  Inhaling food or liquids from your stomach into your lungs (aspiration).  Injury to nerves.  Waking up during your procedure and being unable to move (rare).  Extreme agitation or a state of mental confusion (delirium) when you wake up from the anesthetic.  Air in the bloodstream, which can lead to stroke.  These problems are more likely to develop if you are having a major surgery or if you have an advanced medical condition. You can prevent some of these complications by answering  all of your health care provider's questions thoroughly and by following all pre-procedure instructions. General anesthesia can cause side effects, including:  Nausea or vomiting  A sore throat from the breathing tube.  Feeling cold or shivery.  Feeling tired, washed out, or achy.  Sleepiness or drowsiness.  Confusion or agitation.  What happens before the procedure? Staying hydrated Follow instructions from your health care provider about hydration, which may include:  Up to 2 hours before the procedure - you may continue to drink clear liquids, such as water, clear fruit juice, black coffee, and plain tea.  Eating and drinking restrictions Follow instructions from your health care provider about eating and drinking, which may include:  8 hours before the procedure - stop eating heavy meals or foods such as meat, fried foods, or fatty foods.  6 hours before the procedure - stop eating light meals or foods, such as toast or cereal.  6 hours before the procedure - stop drinking milk or drinks that contain milk.  2 hours before the procedure - stop drinking clear liquids.  Medicines  Ask your health care provider about: ? Changing or stopping your regular medicines. This is especially important if you are taking diabetes medicines or blood thinners. ? Taking medicines such as aspirin and ibuprofen. These medicines  can thin your blood. Do not take these medicines before your procedure if your health care provider instructs you not to. ? Taking new dietary supplements or medicines. Do not take these during the week before your procedure unless your health care provider approves them.  If you are told to take a medicine or to continue taking a medicine on the day of the procedure, take the medicine with sips of water. General instructions   Ask if you will be going home the same day, the following day, or after a longer hospital stay. ? Plan to have someone take you home. ? Plan to have someone stay with you for the first 24 hours after you leave the hospital or clinic.  For 3-6 weeks before the procedure, try not to use any tobacco products, such as cigarettes, chewing tobacco, and e-cigarettes.  You may brush your teeth on the morning of the procedure, but make sure to spit out the toothpaste. What happens during the procedure?  You will be given anesthetics through a mask and through an IV tube in one of your veins.  You may receive medicine to help you relax (sedative).  As soon as you are asleep, a breathing tube may be used to help you breathe.  An anesthesia specialist will stay with you throughout the procedure. He or she will help keep you comfortable and safe by continuing to give you medicines and adjusting the amount of medicine that you get. He or she will also watch your blood pressure, pulse, and oxygen levels to make sure that the anesthetics do not cause any problems.  If a breathing tube was used to help you breathe, it will be removed before you wake up. The procedure may vary among health care providers and hospitals. What happens after the procedure?  You will wake up, often slowly, after the procedure is complete, usually in a recovery area.  Your blood pressure, heart rate, breathing rate, and blood oxygen level will be monitored until the medicines you were given have worn  off.  You may be given medicine to help you calm down if you feel anxious or agitated.  If you will be going home  the same day, your health care provider may check to make sure you can stand, drink, and urinate.  Your health care providers will treat your pain and side effects before you go home.  Do not drive for 24 hours if you received a sedative.  You may: ? Feel nauseous and vomit. ? Have a sore throat. ? Have mental slowness. ? Feel cold or shivery. ? Feel sleepy. ? Feel tired. ? Feel sore or achy, even in parts of your body where you did not have surgery. This information is not intended to replace advice given to you by your health care provider. Make sure you discuss any questions you have with your health care provider. Document Released: 07/19/2007 Document Revised: 09/22/2015 Document Reviewed: 03/26/2015 Elsevier Interactive Patient Education  2018 [employer] Anesthesia, Adult, Care After These instructions provide you with information about caring for yourself after your procedure. Your health care provider may also give you more specific instructions. Your treatment has been planned according to current medical practices, but problems sometimes occur. Call your health care provider if you have any problems or questions after your procedure. What can I expect after the procedure? After the procedure, it is common to have:  Vomiting.  A sore throat.  Mental slowness.  It is common to feel:  Nauseous.  Cold or shivery.  Sleepy.  Tired.  Sore or achy, even in parts of your body where you did not have surgery.  Follow these instructions at home: For at least 24 hours after the procedure:  Do not: ? Participate in activities where you could fall or become injured. ? Drive. ? Use heavy machinery. ? Drink alcohol. ? Take sleeping pills or medicines that cause drowsiness. ? Make important decisions or sign legal documents. ? Take care of  children on your own.  Rest. Eating and drinking  If you vomit, drink water, juice, or soup when you can drink without vomiting.  Drink enough fluid to keep your urine clear or pale yellow.  Make sure you have little or no nausea before eating solid foods.  Follow the diet recommended by your health care provider. General instructions  Have a responsible adult stay with you until you are awake and alert.  Return to your normal activities as told by your health care provider. Ask your health care provider what activities are safe for you.  Take over-the-counter and prescription medicines only as told by your health care provider.  If you smoke, do not smoke without supervision.  Keep all follow-up visits as told by your health care provider. This is important. Contact a health care provider if:  You continue to have nausea or vomiting at home, and medicines are not helpful.  You cannot drink fluids or start eating again.  You cannot urinate after 8-12 hours.  You develop a skin rash.  You have fever.  You have increasing redness at the site of your procedure. Get help right away if:  You have difficulty breathing.  You have chest pain.  You have unexpected bleeding.  You feel that you are having a life-threatening or urgent problem. This information is not intended to replace advice given to you by your health care provider. Make sure you discuss any questions you have with your health care provider. Document Released: 07/18/2000 Document Revised: 09/14/2015 Document Reviewed: 03/26/2015 Elsevier Interactive Patient Education  [employer].

## 2017-10-11 ENCOUNTER — Encounter (HOSPITAL_COMMUNITY): Payer: Self-pay | Admitting: Anesthesiology

## 2017-10-12 ENCOUNTER — Encounter (HOSPITAL_COMMUNITY)
Admission: RE | Admit: 2017-10-12 | Discharge: 2017-10-12 | Disposition: A | Discharge status: Home / Self Care | Payer: Managed Care, Other (non HMO) | Source: Ambulatory Visit | Attending: Orthopedic Surgery | Admitting: Orthopedic Surgery

## 2017-10-12 ENCOUNTER — Encounter (HOSPITAL_COMMUNITY): Payer: Self-pay

## 2017-10-12 ENCOUNTER — Other Ambulatory Visit: Payer: Self-pay

## 2017-10-12 DIAGNOSIS — Z01812 Encounter for preprocedural laboratory examination: Secondary | ICD-10-CM | POA: Insufficient documentation

## 2017-10-12 LAB — CBC WITH DIFFERENTIAL/PLATELET
Basophils Absolute: 0.1 10*3/uL (ref 0.0–0.1)
Basophils Relative: 1 %
EOS ABS: 0.4 10*3/uL (ref 0.0–0.7)
EOS PCT: 4 %
HCT: 47.8 % — ABNORMAL HIGH (ref 36.0–46.0)
HEMOGLOBIN: 16.1 g/dL — AB (ref 12.0–15.0)
LYMPHS ABS: 4.2 10*3/uL — AB (ref 0.7–4.0)
Lymphocytes Relative: 34 %
MCH: 30.4 pg (ref 26.0–34.0)
MCHC: 33.7 g/dL (ref 30.0–36.0)
MCV: 90.2 fL (ref 78.0–100.0)
MONO ABS: 0.6 10*3/uL (ref 0.1–1.0)
Monocytes Relative: 5 %
Neutro Abs: 6.8 10*3/uL (ref 1.7–7.7)
Neutrophils Relative %: 56 %
PLATELETS: 288 10*3/uL (ref 150–400)
RBC: 5.3 MIL/uL — ABNORMAL HIGH (ref 3.87–5.11)
RDW: 14.3 % (ref 11.5–15.5)
WBC: 12.2 10*3/uL — ABNORMAL HIGH (ref 4.0–10.5)

## 2017-10-12 LAB — BASIC METABOLIC PANEL
Anion gap: 10 (ref 5–15)
BUN: 14 mg/dL (ref 6–20)
CHLORIDE: 103 mmol/L (ref 101–111)
CO2: 22 mmol/L (ref 22–32)
CREATININE: 0.45 mg/dL (ref 0.44–1.00)
Calcium: 9 mg/dL (ref 8.9–10.3)
GFR calc Af Amer: >60 mL/min (ref >60)
GFR calc non Af Amer: >60 mL/min (ref >60)
GLUCOSE: 96 mg/dL (ref 65–99)
Potassium: 4.2 mmol/L (ref 3.5–5.1)
Sodium: 135 mmol/L (ref 135–145)

## 2017-10-12 LAB — SURGICAL PCR SCREEN
MRSA, PCR: NEGATIVE
STAPHYLOCOCCUS AUREUS: NEGATIVE

## 2017-10-12 LAB — HEMOGLOBIN A1C
Hgb A1c MFr Bld: 6.2 % — ABNORMAL HIGH (ref 4.8–5.6)
MEAN PLASMA GLUCOSE: 131.24 mg/dL

## 2017-10-12 LAB — GLUCOSE, CAPILLARY: GLUCOSE-CAPILLARY: 98 mg/dL (ref 65–99)

## 2017-10-13 NOTE — Pre-Procedure Instructions (Signed)
HgbA1c routed to PCP. 

## 2017-10-16 ENCOUNTER — Encounter (HOSPITAL_COMMUNITY): Payer: Self-pay | Admitting: *Deleted

## 2017-10-16 MED ORDER — TRANEXAMIC ACID 1000 MG/10ML IV SOLN
1000.0000 mg | INTRAVENOUS | Status: AC
  Administered 2017-10-17: 1000 mg via INTRAVENOUS
  Filled 2017-10-16: qty 10

## 2017-10-17 ENCOUNTER — Other Ambulatory Visit: Payer: Self-pay

## 2017-10-17 ENCOUNTER — Inpatient Hospital Stay (HOSPITAL_COMMUNITY)
Admission: RE | Admit: 2017-10-17 | Discharge: 2017-10-20 | DRG: 470 | Disposition: A | Discharge status: Home Health | Payer: Managed Care, Other (non HMO) | Source: Ambulatory Visit | Attending: Orthopedic Surgery | Admitting: Orthopedic Surgery

## 2017-10-17 ENCOUNTER — Encounter (HOSPITAL_COMMUNITY)
Admission: RE | Disposition: A | Discharge status: Home Health | Payer: Self-pay | Source: Ambulatory Visit | Attending: Orthopedic Surgery

## 2017-10-17 ENCOUNTER — Inpatient Hospital Stay (HOSPITAL_COMMUNITY): Payer: Managed Care, Other (non HMO) | Admitting: Anesthesiology

## 2017-10-17 ENCOUNTER — Inpatient Hospital Stay (HOSPITAL_COMMUNITY): Payer: Managed Care, Other (non HMO)

## 2017-10-17 ENCOUNTER — Encounter (HOSPITAL_COMMUNITY): Payer: Self-pay | Admitting: Anesthesiology

## 2017-10-17 DIAGNOSIS — G8929 Other chronic pain: Secondary | ICD-10-CM | POA: Diagnosis present

## 2017-10-17 DIAGNOSIS — M1712 Unilateral primary osteoarthritis, left knee: Principal | ICD-10-CM | POA: Diagnosis present

## 2017-10-17 DIAGNOSIS — E119 Type 2 diabetes mellitus without complications: Secondary | ICD-10-CM | POA: Diagnosis present

## 2017-10-17 DIAGNOSIS — Z888 Allergy status to other drugs, medicaments and biological substances status: Secondary | ICD-10-CM

## 2017-10-17 DIAGNOSIS — Z79899 Other long term (current) drug therapy: Secondary | ICD-10-CM | POA: Diagnosis not present

## 2017-10-17 DIAGNOSIS — I10 Essential (primary) hypertension: Secondary | ICD-10-CM | POA: Diagnosis present

## 2017-10-17 DIAGNOSIS — Z833 Family history of diabetes mellitus: Secondary | ICD-10-CM | POA: Diagnosis not present

## 2017-10-17 DIAGNOSIS — Z8249 Family history of ischemic heart disease and other diseases of the circulatory system: Secondary | ICD-10-CM

## 2017-10-17 DIAGNOSIS — M5430 Sciatica, unspecified side: Secondary | ICD-10-CM | POA: Diagnosis present

## 2017-10-17 DIAGNOSIS — M549 Dorsalgia, unspecified: Secondary | ICD-10-CM | POA: Diagnosis present

## 2017-10-17 DIAGNOSIS — K760 Fatty (change of) liver, not elsewhere classified: Secondary | ICD-10-CM | POA: Diagnosis present

## 2017-10-17 DIAGNOSIS — Z96652 Presence of left artificial knee joint: Secondary | ICD-10-CM

## 2017-10-17 DIAGNOSIS — F17210 Nicotine dependence, cigarettes, uncomplicated: Secondary | ICD-10-CM | POA: Diagnosis present

## 2017-10-17 DIAGNOSIS — Z9049 Acquired absence of other specified parts of digestive tract: Secondary | ICD-10-CM

## 2017-10-17 DIAGNOSIS — Z885 Allergy status to narcotic agent status: Secondary | ICD-10-CM

## 2017-10-17 HISTORY — PX: TOTAL KNEE ARTHROPLASTY: SHX125

## 2017-10-17 LAB — GLUCOSE, CAPILLARY
Glucose-Capillary: 132 mg/dL — ABNORMAL HIGH (ref 70–99)
Glucose-Capillary: 115 mg/dL — ABNORMAL HIGH (ref 70–99)

## 2017-10-17 SURGERY — ARTHROPLASTY, KNEE, TOTAL
Anesthesia: General | Site: Knee | Laterality: Left

## 2017-10-17 MED ORDER — ONDANSETRON HCL 4 MG/2ML IJ SOLN
4.0000 mg | Freq: Once | INTRAMUSCULAR | Status: AC | PRN
  Administered 2017-10-17: 4 mg via INTRAVENOUS

## 2017-10-17 MED ORDER — PROPOFOL 500 MG/50ML IV EMUL
INTRAVENOUS | Status: DC | PRN
  Administered 2017-10-17: 50 ug/kg/min via INTRAVENOUS
  Administered 2017-10-17 (×3): via INTRAVENOUS

## 2017-10-17 MED ORDER — SODIUM CHLORIDE 0.9 % IJ SOLN
INTRAMUSCULAR | Status: AC
  Filled 2017-10-17: qty 10

## 2017-10-17 MED ORDER — OXYCODONE HCL 5 MG PO TABS: 5.0000 mg | ORAL_TABLET | ORAL | Status: DC | PRN

## 2017-10-17 MED ORDER — ACETAMINOPHEN 500 MG PO TABS
1000.0000 mg | ORAL_TABLET | Freq: Once | ORAL | Status: AC
  Administered 2017-10-17: 1000 mg via ORAL
  Filled 2017-10-17: qty 2

## 2017-10-17 MED ORDER — ACETAMINOPHEN 500 MG PO TABS
1000.0000 mg | ORAL_TABLET | Freq: Four times a day (QID) | ORAL | Status: AC
  Administered 2017-10-17 – 2017-10-18 (×4): 1000 mg via ORAL
  Filled 2017-10-17 (×4): qty 2

## 2017-10-17 MED ORDER — ONDANSETRON HCL 4 MG/2ML IJ SOLN: 4.0000 mg | Freq: Four times a day (QID) | INTRAMUSCULAR | Status: DC | PRN

## 2017-10-17 MED ORDER — FENTANYL CITRATE (PF) 100 MCG/2ML IJ SOLN
INTRAMUSCULAR | Status: DC | PRN
  Administered 2017-10-17: 50 ug via INTRAVENOUS

## 2017-10-17 MED ORDER — SODIUM CHLORIDE 0.9 % IR SOLN
Status: DC | PRN
  Administered 2017-10-17: 3000 mL

## 2017-10-17 MED ORDER — METHOCARBAMOL 1000 MG/10ML IJ SOLN
500.0000 mg | Freq: Four times a day (QID) | INTRAVENOUS | Status: DC | PRN
  Filled 2017-10-17: qty 5

## 2017-10-17 MED ORDER — MIDAZOLAM HCL 5 MG/5ML IJ SOLN
INTRAMUSCULAR | Status: DC | PRN
  Administered 2017-10-17: 1 mg via INTRAVENOUS
  Administered 2017-10-17 (×2): 2 mg via INTRAVENOUS
  Administered 2017-10-17: 1 mg via INTRAVENOUS

## 2017-10-17 MED ORDER — SODIUM CHLORIDE 0.9 % IJ SOLN
INTRAMUSCULAR | Status: AC
  Filled 2017-10-17: qty 40

## 2017-10-17 MED ORDER — LIDOCAINE HCL (CARDIAC) PF 100 MG/5ML IV SOSY
PREFILLED_SYRINGE | INTRAVENOUS | Status: DC | PRN
  Administered 2017-10-17: 40 mg via INTRAVENOUS

## 2017-10-17 MED ORDER — OXYCODONE HCL 5 MG PO TABS
10.0000 mg | ORAL_TABLET | ORAL | Status: DC | PRN
  Administered 2017-10-17 – 2017-10-18 (×4): 15 mg via ORAL
  Filled 2017-10-17 (×4): qty 3

## 2017-10-17 MED ORDER — OXYCODONE HCL 5 MG PO TABS
5.0000 mg | ORAL_TABLET | Freq: Once | ORAL | Status: AC
  Administered 2017-10-17: 5 mg via ORAL
  Filled 2017-10-17: qty 1

## 2017-10-17 MED ORDER — PROMETHAZINE HCL 12.5 MG PO TABS
12.5000 mg | ORAL_TABLET | ORAL | Status: DC | PRN
Start: 2017-10-17 — End: 2017-10-20
  Administered 2017-10-17 – 2017-10-18 (×3): 12.5 mg via ORAL
  Filled 2017-10-17 (×3): qty 1

## 2017-10-17 MED ORDER — MIDAZOLAM HCL 2 MG/2ML IJ SOLN
INTRAMUSCULAR | Status: AC
  Filled 2017-10-17: qty 2

## 2017-10-17 MED ORDER — CEFAZOLIN SODIUM-DEXTROSE 2-4 GM/100ML-% IV SOLN
2.0000 g | Freq: Four times a day (QID) | INTRAVENOUS | Status: AC
  Administered 2017-10-17 – 2017-10-18 (×2): 2 g via INTRAVENOUS
  Filled 2017-10-17 (×3): qty 100

## 2017-10-17 MED ORDER — MEPERIDINE HCL 50 MG/ML IJ SOLN: 6.2500 mg | INTRAMUSCULAR | Status: DC | PRN

## 2017-10-17 MED ORDER — PROPOFOL 10 MG/ML IV BOLUS
INTRAVENOUS | Status: AC
  Filled 2017-10-17: qty 20

## 2017-10-17 MED ORDER — KETOROLAC TROMETHAMINE 30 MG/ML IJ SOLN: 30.0000 mg | Freq: Once | INTRAMUSCULAR | Status: DC | PRN

## 2017-10-17 MED ORDER — ROPIVACAINE HCL 5 MG/ML IJ SOLN
INTRAMUSCULAR | Status: AC
  Filled 2017-10-17: qty 30

## 2017-10-17 MED ORDER — ARTIFICIAL TEARS OPHTHALMIC OINT
TOPICAL_OINTMENT | OPHTHALMIC | Status: AC
  Filled 2017-10-17: qty 7

## 2017-10-17 MED ORDER — LACTATED RINGERS IV SOLN: INTRAVENOUS | Status: DC

## 2017-10-17 MED ORDER — BUPIVACAINE-EPINEPHRINE (PF) 0.5% -1:200000 IJ SOLN
INTRAMUSCULAR | Status: AC
  Filled 2017-10-17: qty 30

## 2017-10-17 MED ORDER — HYDROCODONE-ACETAMINOPHEN 7.5-325 MG PO TABS: 1.0000 | ORAL_TABLET | Freq: Once | ORAL | Status: DC | PRN

## 2017-10-17 MED ORDER — METOCLOPRAMIDE HCL 5 MG/ML IJ SOLN: 5.0000 mg | Freq: Three times a day (TID) | INTRAMUSCULAR | Status: DC | PRN

## 2017-10-17 MED ORDER — HYDROMORPHONE HCL 1 MG/ML IJ SOLN: 0.2500 mg | INTRAMUSCULAR | Status: DC | PRN

## 2017-10-17 MED ORDER — METOCLOPRAMIDE HCL 10 MG PO TABS: 5.0000 mg | ORAL_TABLET | Freq: Three times a day (TID) | ORAL | Status: DC | PRN

## 2017-10-17 MED ORDER — CEFAZOLIN SODIUM-DEXTROSE 2-4 GM/100ML-% IV SOLN
2.0000 g | INTRAVENOUS | Status: AC
  Administered 2017-10-17: 2 g via INTRAVENOUS
  Filled 2017-10-17: qty 100

## 2017-10-17 MED ORDER — DEXAMETHASONE SODIUM PHOSPHATE 4 MG/ML IJ SOLN
INTRAMUSCULAR | Status: AC
  Filled 2017-10-17: qty 2

## 2017-10-17 MED ORDER — ROCURONIUM BROMIDE 50 MG/5ML IV SOLN
INTRAVENOUS | Status: AC
  Filled 2017-10-17: qty 1

## 2017-10-17 MED ORDER — ONDANSETRON HCL 4 MG/2ML IJ SOLN
INTRAMUSCULAR | Status: DC | PRN
  Administered 2017-10-17: 4 mg via INTRAVENOUS

## 2017-10-17 MED ORDER — PROPOFOL 10 MG/ML IV BOLUS
INTRAVENOUS | Status: AC
  Filled 2017-10-17: qty 40

## 2017-10-17 MED ORDER — METHOCARBAMOL 1000 MG/10ML IJ SOLN
500.0000 mg | Freq: Four times a day (QID) | INTRAVENOUS | Status: DC
  Administered 2017-10-17: 500 mg via INTRAVENOUS
  Filled 2017-10-17 (×4): qty 5

## 2017-10-17 MED ORDER — PHENOL 1.4 % MT LIQD: 1.0000 | OROMUCOSAL | Status: DC | PRN

## 2017-10-17 MED ORDER — CELECOXIB 100 MG PO CAPS
200.0000 mg | ORAL_CAPSULE | Freq: Every day | ORAL | Status: DC
  Administered 2017-10-17: 200 mg via ORAL
  Filled 2017-10-17: qty 2

## 2017-10-17 MED ORDER — METHOCARBAMOL 500 MG PO TABS
500.0000 mg | ORAL_TABLET | Freq: Four times a day (QID) | ORAL | Status: DC | PRN
  Administered 2017-10-17 – 2017-10-19 (×7): 500 mg via ORAL
  Filled 2017-10-17 (×7): qty 1

## 2017-10-17 MED ORDER — ASPIRIN EC 325 MG PO TBEC
325.0000 mg | DELAYED_RELEASE_TABLET | Freq: Every day | ORAL | Status: DC
  Administered 2017-10-18 – 2017-10-20 (×3): 325 mg via ORAL
  Filled 2017-10-17 (×3): qty 1

## 2017-10-17 MED ORDER — SODIUM CHLORIDE 0.9 % IV SOLN
INTRAVENOUS | Status: DC | PRN
  Administered 2017-10-17: 60 mL

## 2017-10-17 MED ORDER — FENTANYL CITRATE (PF) 250 MCG/5ML IJ SOLN
INTRAMUSCULAR | Status: AC
  Filled 2017-10-17: qty 5

## 2017-10-17 MED ORDER — SODIUM CHLORIDE 0.9 % IV SOLN
INTRAVENOUS | Status: DC
  Administered 2017-10-17 – 2017-10-19 (×4): via INTRAVENOUS

## 2017-10-17 MED ORDER — PREGABALIN 50 MG PO CAPS
50.0000 mg | ORAL_CAPSULE | Freq: Three times a day (TID) | ORAL | Status: DC
  Administered 2017-10-17: 50 mg via ORAL
  Filled 2017-10-17: qty 1

## 2017-10-17 MED ORDER — LACTATED RINGERS IV SOLN
INTRAVENOUS | Status: DC
  Administered 2017-10-17 (×2): via INTRAVENOUS

## 2017-10-17 MED ORDER — BUPIVACAINE IN DEXTROSE 0.75-8.25 % IT SOLN
INTRATHECAL | Status: DC | PRN
  Administered 2017-10-17: 15 mg via INTRATHECAL

## 2017-10-17 MED ORDER — DEXAMETHASONE SODIUM PHOSPHATE 10 MG/ML IJ SOLN
10.0000 mg | Freq: Once | INTRAMUSCULAR | Status: AC
  Administered 2017-10-18: 10 mg via INTRAVENOUS
  Filled 2017-10-17: qty 1

## 2017-10-17 MED ORDER — 0.9 % SODIUM CHLORIDE (POUR BTL) OPTIME
TOPICAL | Status: DC | PRN
  Administered 2017-10-17: 1000 mL

## 2017-10-17 MED ORDER — ONDANSETRON HCL 4 MG PO TABS: 4.0000 mg | ORAL_TABLET | Freq: Four times a day (QID) | ORAL | Status: DC | PRN

## 2017-10-17 MED ORDER — PHENYLEPHRINE 40 MCG/ML (10ML) SYRINGE FOR IV PUSH (FOR BLOOD PRESSURE SUPPORT)
PREFILLED_SYRINGE | INTRAVENOUS | Status: AC
  Filled 2017-10-17: qty 10

## 2017-10-17 MED ORDER — SENNOSIDES-DOCUSATE SODIUM 8.6-50 MG PO TABS: 1.0000 | ORAL_TABLET | Freq: Every evening | ORAL | Status: DC | PRN

## 2017-10-17 MED ORDER — SODIUM CHLORIDE 0.9 % IJ SOLN
INTRAMUSCULAR | Status: AC
  Filled 2017-10-17: qty 20

## 2017-10-17 MED ORDER — NICOTINE 21 MG/24HR TD PT24
21.0000 mg | MEDICATED_PATCH | Freq: Every day | TRANSDERMAL | Status: DC
  Administered 2017-10-17 – 2017-10-20 (×4): 21 mg via TRANSDERMAL
  Filled 2017-10-17 (×4): qty 1

## 2017-10-17 MED ORDER — LIDOCAINE HCL (PF) 1 % IJ SOLN
INTRAMUSCULAR | Status: AC
  Filled 2017-10-17: qty 5

## 2017-10-17 MED ORDER — MENTHOL 3 MG MT LOZG: 1.0000 | LOZENGE | OROMUCOSAL | Status: DC | PRN

## 2017-10-17 MED ORDER — ONDANSETRON HCL 4 MG/2ML IJ SOLN
4.0000 mg | Freq: Four times a day (QID) | INTRAMUSCULAR | Status: DC
  Filled 2017-10-17: qty 2

## 2017-10-17 MED ORDER — ONDANSETRON HCL 4 MG/2ML IJ SOLN
INTRAMUSCULAR | Status: AC
  Filled 2017-10-17: qty 2

## 2017-10-17 MED ORDER — DOCUSATE SODIUM 100 MG PO CAPS
100.0000 mg | ORAL_CAPSULE | Freq: Two times a day (BID) | ORAL | Status: DC
  Administered 2017-10-17 – 2017-10-20 (×6): 100 mg via ORAL
  Filled 2017-10-17 (×6): qty 1

## 2017-10-17 MED ORDER — DIPHENHYDRAMINE HCL 12.5 MG/5ML PO ELIX: 12.5000 mg | ORAL_SOLUTION | ORAL | Status: DC | PRN

## 2017-10-17 MED ORDER — BUPIVACAINE LIPOSOME 1.3 % IJ SUSP
INTRAMUSCULAR | Status: AC
  Filled 2017-10-17: qty 20

## 2017-10-17 MED ORDER — SUCCINYLCHOLINE CHLORIDE 20 MG/ML IJ SOLN
INTRAMUSCULAR | Status: AC
  Filled 2017-10-17: qty 1

## 2017-10-17 MED ORDER — CHLORHEXIDINE GLUCONATE 4 % EX LIQD: 60.0000 mL | Freq: Once | CUTANEOUS | Status: DC

## 2017-10-17 MED ORDER — CEFAZOLIN SODIUM-DEXTROSE 2-4 GM/100ML-% IV SOLN
INTRAVENOUS | Status: AC
  Filled 2017-10-17: qty 100

## 2017-10-17 MED ORDER — EPHEDRINE SULFATE 50 MG/ML IJ SOLN
INTRAMUSCULAR | Status: AC
  Filled 2017-10-17: qty 1

## 2017-10-17 SURGICAL SUPPLY — 69 items
ANCH SUT 2 CRKSW 15.5X5 (Anchor) ×1 IMPLANT
ANCHOR CORKSCREW 5.0 FIBERWIRE (Anchor) ×1 IMPLANT
ANCHOR SUT CORKSCREW 5X15.5 (Anchor) ×1 IMPLANT
BANDAGE ELASTIC 4 LF NS (GAUZE/BANDAGES/DRESSINGS) ×4 IMPLANT
BANDAGE ELASTIC 6 LF NS (GAUZE/BANDAGES/DRESSINGS) ×2 IMPLANT
BANDAGE ESMARK 6X9 LF (GAUZE/BANDAGES/DRESSINGS) ×1 IMPLANT
BIT DRILL 2.0X128 (BIT) ×1 IMPLANT
BIT DRILL 3.2X128 (BIT) IMPLANT
BLADE HEX COATED 2.75 (ELECTRODE) ×2 IMPLANT
BLADE SAGITTAL 25.0X1.27X90 (BLADE) ×2 IMPLANT
BNDG CMPR 9X6 STRL LF SNTH (GAUZE/BANDAGES/DRESSINGS) ×1
BNDG CMPR MED 5X4 ELC HKLP NS (GAUZE/BANDAGES/DRESSINGS) ×2
BNDG CMPR MED 5X6 ELC HKLP NS (GAUZE/BANDAGES/DRESSINGS) ×1
BNDG ESMARK 6X9 LF (GAUZE/BANDAGES/DRESSINGS) ×2
CAP KNEE TOTAL 3 SIGMA ×1 IMPLANT
CEMENT HV SMART SET (Cement) ×4 IMPLANT
CLOTH BEACON ORANGE TIMEOUT ST (SAFETY) ×2 IMPLANT
COOLER CRYO CUFF IC AND MOTOR (MISCELLANEOUS) ×2 IMPLANT
COVER LIGHT HANDLE STERIS (MISCELLANEOUS) ×4 IMPLANT
CUFF CRYO KNEE18X23 MED (MISCELLANEOUS) ×1 IMPLANT
CUFF TOURNIQUET SINGLE 34IN LL (TOURNIQUET CUFF) ×1 IMPLANT
DECANTER SPIKE VIAL GLASS SM (MISCELLANEOUS) ×4 IMPLANT
DRAPE BACK TABLE (DRAPES) ×2 IMPLANT
DRAPE EXTREMITY T 121X128X90 (DRAPE) ×2 IMPLANT
DRSG MEPILEX BORDER 4X12 (GAUZE/BANDAGES/DRESSINGS) ×2 IMPLANT
DURAPREP 26ML APPLICATOR (WOUND CARE) ×4 IMPLANT
ELECT REM PT RETURN 9FT ADLT (ELECTROSURGICAL) ×2
ELECTRODE REM PT RTRN 9FT ADLT (ELECTROSURGICAL) ×1 IMPLANT
GLOVE BIO SURGEON STRL SZ7 (GLOVE) ×5 IMPLANT
GLOVE BIOGEL PI IND STRL 7.0 (GLOVE) ×2 IMPLANT
GLOVE BIOGEL PI INDICATOR 7.0 (GLOVE) ×4
GLOVE SKINSENSE NS SZ8.0 LF (GLOVE) ×2
GLOVE SKINSENSE STRL SZ8.0 LF (GLOVE) ×2 IMPLANT
GLOVE SS N UNI LF 8.5 STRL (GLOVE) ×2 IMPLANT
GOWN STRL REUS W/ TWL LRG LVL3 (GOWN DISPOSABLE) ×1 IMPLANT
GOWN STRL REUS W/TWL LRG LVL3 (GOWN DISPOSABLE) ×6 IMPLANT
GOWN STRL REUS W/TWL XL LVL3 (GOWN DISPOSABLE) ×2 IMPLANT
HANDPIECE INTERPULSE COAX TIP (DISPOSABLE) ×2
HOOD W/PEELAWAY (MISCELLANEOUS) ×8 IMPLANT
INST SET MAJOR BONE (KITS) ×2 IMPLANT
IV NS IRRIG 3000ML ARTHROMATIC (IV SOLUTION) ×2 IMPLANT
KIT BLADEGUARD II DBL (SET/KITS/TRAYS/PACK) ×2 IMPLANT
KIT TURNOVER CYSTO (KITS) ×2 IMPLANT
MANIFOLD NEPTUNE II (INSTRUMENTS) ×2 IMPLANT
MARKER SKIN DUAL TIP RULER LAB (MISCELLANEOUS) ×2 IMPLANT
NDL HYPO 21X1.5 SAFETY (NEEDLE) ×1 IMPLANT
NEEDLE HYPO 21X1.5 SAFETY (NEEDLE) ×2 IMPLANT
NS IRRIG 1000ML POUR BTL (IV SOLUTION) ×2 IMPLANT
PACK TOTAL JOINT (CUSTOM PROCEDURE TRAY) ×2 IMPLANT
PAD ARMBOARD 7.5X6 YLW CONV (MISCELLANEOUS) ×2 IMPLANT
PAD DANNIFLEX CPM (ORTHOPEDIC SUPPLIES) ×2 IMPLANT
PILLOW KNEE EXTENSION 0 DEG (MISCELLANEOUS) ×2 IMPLANT
PIN TROCAR 3 INCH (PIN) ×1 IMPLANT
SAW OSC TIP CART 19.5X105X1.3 (SAW) ×2 IMPLANT
SET BASIN LINEN APH (SET/KITS/TRAYS/PACK) ×2 IMPLANT
SET HNDPC FAN SPRY TIP SCT (DISPOSABLE) ×1 IMPLANT
STAPLER VISISTAT 35W (STAPLE) ×2 IMPLANT
SUT BRALON NAB BRD #1 30IN (SUTURE) ×5 IMPLANT
SUT MNCRL 0 VIOLET CTX 36 (SUTURE) ×1 IMPLANT
SUT MON AB 0 CT1 (SUTURE) ×3 IMPLANT
SUT MON AB 2-0 CT1 36 (SUTURE) IMPLANT
SUT MONOCRYL 0 CTX 36 (SUTURE) ×1
SYR 20CC LL (SYRINGE) ×5 IMPLANT
SYR BULB IRRIGATION 50ML (SYRINGE) ×2 IMPLANT
TOWEL OR 17X26 4PK STRL BLUE (TOWEL DISPOSABLE) ×2 IMPLANT
TOWER CARTRIDGE SMART MIX (DISPOSABLE) ×2 IMPLANT
TRAY FOLEY METER SIL LF 16FR (CATHETERS) ×2 IMPLANT
WATER STERILE IRR 1000ML POUR (IV SOLUTION) ×4 IMPLANT
YANKAUER SUCT 12FT TUBE ARGYLE (SUCTIONS) ×2 IMPLANT

## 2017-10-17 NOTE — Anesthesia Procedure Notes (Signed)
Spinal  Patient location during procedure: OR Start time: 10/17/2017 1:29 PM Staffing Anesthesiologist: Nicanor Alcon, MD Resident/CRNA: Mickel Baas, CRNA Performed: anesthesiologist  Preanesthetic Checklist Completed: patient identified, site marked, surgical consent, pre-op evaluation, timeout performed, IV checked, risks and benefits discussed and monitors and equipment checked Spinal Block Patient position: left lateral decubitus Prep: Betadine Patient monitoring: heart rate, cardiac monitor, continuous pulse ox and blood pressure Approach: left paramedian Location: L3-4 Injection technique: single-shot Needle Needle type: Pencan  Needle gauge: 24 G Needle length: 9 cm Assessment Sensory level: T8 Additional Notes  ATTEMPTS: 2attempts by CRNA; Spinal placed in 1 attempt by Dr. Steele Berg ZB:[id number] [place] DATE:2019-02-23

## 2017-10-17 NOTE — H&P (Signed)
TOTAL KNEE ADMISSION H&P  Patient is being admitted for left total knee arthroplasty.  Subjective:  Chief Complaint:left knee pain.  HPI: [name], 47 y.o. female, has a history of pain and functional disability in the left knee due to arthritis and has failed non-surgical conservative treatments for greater than 12 weeks to includeNSAID's and/or analgesics, corticosteriod injections, flexibility and strengthening excercises, supervised PT with diminished ADL's post treatment, use of assistive devices and activity modification.  Onset of symptoms was gradual, starting 1 years ago with gradually worsening course since that time. The patient noted prior procedures on the knee to include  Arthroscopy left knee microfracture October 20, 2016, arthroscopy left knee oats procedure April 27, 2017. on the left knee(s).  Patient currently rates pain in the left knee(s) at 9 out of 10 with activity. Patient has night pain, worsening of pain with activity and weight bearing, pain that interferes with activities of daily living, pain with passive range of motion, crepitus and joint swelling.  Patient has evidence of joint space narrowing by imaging studies. There is no active infection.  Patient Active Problem List   Diagnosis Date Noted  . 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 Chondral defect of condyle of right femur   . S/P left knee arthroscopy 04/27/17 03/28/2017  . Primary osteoarthritis of left knee   . 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 Abdominal pain 02/17/2015  . Nausea without vomiting 02/17/2015  . Diverticulitis of intestine with abscess 01/08/2015  . Acute diverticulitis 01/08/2015  . Nausea 12/31/2014  . Diverticulitis 12/31/2014  . Hyperglycemia 12/31/2014  . Medication reaction 12/31/2014  . Diverticulitis of large intestine without perforation or abscess without bleeding   . Thrush, oral   . Fatty liver 03/11/2014  . Family hx of colon cancer requiring  screening colonoscopy 03/11/2014  . Rt flank pain 03/11/2014  . Ulnar nerve compression 06/14/2011  . 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 LOW BACK PAIN 11/07/2007  . SCIATICA 11/07/2007  . LUMBAR SPASM 11/07/2007   Past Medical History:  Diagnosis Date  . Arthritis   . Carpal tunnel syndrome, bilateral   . Diabetes mellitus without complication ([place])    no meds; diet controlled  . Diverticulitis   . Hypertension   . PONV (postoperative nausea and vomiting)   . Sciatica     Past Surgical History:  Procedure Laterality Date  . ablasion of uterus    . CARPAL TUNNEL RELEASE  11/10/2010   Procedure: CARPAL TUNNEL RELEASE;  Surgeon: Arther Abbott, MD;  Location: [facility];  Service: Orthopedics;  Laterality: Right;  . CHOLECYSTECTOMY     [facility]  . COLON RESECTION N/A 04/08/2015   Procedure: LAPAROSCOPIC HAND ASSISTED PARTIAL COLECTOMY  CONVERTED TO OPEN AT 3244;  Surgeon: Aviva Signs, MD;  Location: [facility];  Service: General;  Laterality: N/A;  . COLONOSCOPY N/A 03/28/2014   Procedure: COLONOSCOPY;  Surgeon: Rogene Houston, MD;  Location: [facility];  Service: Endoscopy;  Laterality: N/A;  730  . KNEE ARTHROSCOPY WITH DRILLING/MICROFRACTURE Left 10/20/2016   Procedure: LEFT KNEE ARTHROSCOPY WITH MICROFRACTURE;  Surgeon: Carole Civil, MD;  Location: [facility];  Service: Orthopedics;  Laterality: Left;  . KNEE ARTHROSCOPY WITH DRILLING/MICROFRACTURE Left 04/27/2017   Procedure: KNEE ARTHROSCOPY WITH DRILLING/MICROFRACTURE;  Surgeon: Carole Civil, MD;  Location: [facility];  Service: Orthopedics;  Laterality: Left;  . KNEE ARTHROSCOPY WITH OSTEOCHONDRAL DEFECT REPAIR Left 04/27/2017   Procedure: KNEE ARTHROSCOPY  WITH OSTEOCHONDRAL DEFECT REPAIR Autograft;  Surgeon: Carole Civil, MD;  Location: [facility];  Service: Orthopedics;  Laterality: Left;  . KNEE SURGERY    . PARTIAL COLECTOMY N/A 04/08/2015   Procedure: PARTIAL COLECTOMY;  Surgeon: Aviva Signs, MD;  Location:  [facility];  Service: General;  Laterality: N/A;  . right knee arthroscopy    . TUBAL LIGATION      Current Facility-Administered Medications  Medication Dose Route Frequency Provider Last Rate Last Dose  . tranexamic acid (CYKLOKAPRON) 1,000 mg in sodium chloride 0.9 % 100 mL IVPB  1,000 mg Intravenous To OR Carole Civil, MD       Current Outpatient Medications  Medication Sig Dispense Refill Last Dose  . acetaminophen (TYLENOL) 500 MG tablet Take 1,000 mg by mouth every 6 (six) hours as needed for moderate pain.    Taking  . ibuprofen (ADVIL,MOTRIN) 200 MG tablet Take 800 mg by mouth every 8 (eight) hours as needed for mild pain or moderate pain.   Taking  . promethazine (PHENERGAN) 12.5 MG tablet Take 1 tablet (12.5 mg total) by mouth every 6 (six) hours as needed for nausea or vomiting. 30 tablet 0   . HYDROcodone-acetaminophen (NORCO/VICODIN) 5-325 MG tablet Take 1 tablet by mouth every 6 (six) hours as needed for moderate pain. 30 tablet 0    Allergies  Allergen Reactions  . Flagyl [Metronidazole] Nausea Only  . Hydrocodone Nausea And Vomiting and Other (See Comments)    Can take need to take with medication for nausea and vomitting    Social History   Tobacco Use  . Smoking status: Current Every Day Smoker    Packs/day: 1.00    Years: 26.00    Pack years: 26.00    Types: Cigarettes  . Smokeless tobacco: Never Used  Substance Use Topics  . Alcohol use: No    Alcohol/week: 0.0 oz    Family History  Problem Relation Age of Onset  . Diabetes type I Mother   . Hyperlipidemia Mother   . Diabetes type II Father   . Hypertension Father   . Hyperlipidemia Father   . Diabetes type II Brother   . Diabetes type II Brother   . Cancer Unknown   . Diabetes Unknown   . Arthritis Unknown   . Asthma Unknown   . Anesthesia problems Neg Hx      Review of Systems  Constitutional: Negative for fever, malaise/fatigue and weight loss.  HENT: Negative for ear discharge, hearing  loss and sore throat.   Eyes: Negative for blurred vision and pain.  Respiratory: Positive for shortness of breath.   Cardiovascular: Negative for chest pain.  Gastrointestinal: Negative for heartburn.  Genitourinary: Negative for dysuria.  Musculoskeletal: Positive for joint pain. Negative for falls, myalgias and neck pain.  Skin: Negative for itching and rash.  Neurological: Negative for dizziness.  Endo/Heme/Allergies: Negative for environmental allergies and polydipsia. Does not bruise/bleed easily.  Psychiatric/Behavioral: Negative.     Objective:  Physical Exam  Constitutional: She is oriented to person, place, and time. She appears well-developed and well-nourished. No distress.  HENT:  Head: Normocephalic and atraumatic.  Right Ear: External ear normal.  Left Ear: External ear normal.  Nose: Nose normal.  Mouth/Throat: No oropharyngeal exudate.  Eyes: Pupils are equal, round, and reactive to light. Conjunctivae and EOM are normal. Right eye exhibits no discharge. Left eye exhibits no discharge. No scleral icterus.  Neck: Normal range of motion. Neck supple. No JVD present. No  tracheal deviation present. No thyromegaly present.  Cardiovascular: Normal rate, normal heart sounds and intact distal pulses.  Respiratory: Effort normal. No respiratory distress. She has no wheezes. She exhibits no tenderness.  GI: Soft. Bowel sounds are normal. She exhibits no distension.  Musculoskeletal:  Right and left upper extremity normal alignment.  Range of motion normal without pain or contracture.  Stability tests normal without subluxation or laxity.  Muscle tone and strength normal without tremor.  Skin normal without laceration ulceration.  Sensation normal pulses normal.  Right knee normal alignment.  Range of motion normal without pain or contracture.  Stability tests normal without subluxation or laxity.  Muscle tone and strength normal without tremor.  Skin normal without laceration  ulceration.  Sensation normal pulses normal.  Left knee small effusion tenderness medial femoral condyle posterior popliteal fossa alignment normal.  Range of motion 10-110.  All ligaments are stable.  Muscle strength and tone normal.  Skin prior incisions normal.  No erythema.  Sensation normal.  Pulses normal.  Lymphadenopathy:       Right cervical: No superficial cervical adenopathy present.      Left cervical: No superficial cervical adenopathy present.  Neurological: She is alert and oriented to person, place, and time. She has normal reflexes. She displays normal reflexes. No cranial nerve deficit. She exhibits normal muscle tone. Coordination normal.  Skin: Skin is warm and dry. No rash noted. She is not diaphoretic. No erythema.  Psychiatric: She has a normal mood and affect. Her behavior is normal. Judgment and thought content normal.    Vital signs in last 24 hours:    Labs: CBC Latest Ref Rng & Units 10/12/2017 04/21/2017 10/14/2016  WBC 4.0 - 10.5 K/uL 12.2(H) 12.1(H) 12.4(H)  Hemoglobin 12.0 - 15.0 g/dL 16.1(H) 15.7(H) 14.2  Hematocrit 36.0 - 46.0 % 47.8(H) 47.0(H) 42.0  Platelets 150 - 400 K/uL 288 330 320   BMP Latest Ref Rng & Units 10/12/2017 04/21/2017 10/14/2016  Glucose 65 - 99 mg/dL 96 205(H) 204(H)  BUN 6 - 20 mg/dL 14 17 15   Creatinine 0.44 - 1.00 mg/dL 0.45 0.50 0.63  Sodium 135 - 145 mmol/L 135 136 137  Potassium 3.5 - 5.1 mmol/L 4.2 3.9 4.0  Chloride 101 - 111 mmol/L 103 102 106  CO2 22 - 32 mmol/L 22 21(L) 22  Calcium 8.9 - 10.3 mg/dL 9.0 9.8 8.8(L)     Estimated body mass index is 31.62 kg/m as calculated from the following:   Height as of 08/29/17: 5\' 5"  (1.651 m).   Weight as of 08/29/17: 190 lb (86.2 kg).   Imaging Review Plain radiographs demonstrate  degenerative joint disease of the left knee(s). The overall alignment isneutral. The bone quality appears to be good for age and reported activity level.   Assessment/Plan:  Mrs. Ramey has really  suffered over the last few months.  She did not get good relief from any of the last 2 procedures on her knee.  She attempted to go back to work on several occasions and was unsuccessful.  At this point we both agreed that based on her age and dysfunction that a knee replacement would be the best option even in the setting of age 61 and smoking history.  End stage arthritis, left knee   The patient history, physical examination, clinical judgment of the provider and imaging studies are consistent with end stage degenerative joint disease of the left knee(s) and total knee arthroplasty is deemed medically necessary. The treatment options including  medical management, injection therapy arthroscopy and arthroplasty were discussed at length. The risks and benefits of total knee arthroplasty were presented and reviewed. The risks due to aseptic loosening, infection, stiffness, patella tracking problems, thromboembolic complications and other imponderables were discussed. The patient acknowledged the explanation, agreed to proceed with the plan and consent was signed. Patient is being admitted for inpatient treatment for surgery, pain control, PT, OT, prophylactic antibiotics, VTE prophylaxis, progressive ambulation and ADL's and discharge planning. The patient is planning to be discharged home with home health services

## 2017-10-17 NOTE — Anesthesia Postprocedure Evaluation (Signed)
Anesthesia Post Note  Patient: [name]  Procedure(s) Performed: TOTAL KNEE ARTHROPLASTY (Left Knee)  Patient location during evaluation: PACU Anesthesia Type: General Level of consciousness: awake and alert and oriented Pain management: pain level controlled Vital Signs Assessment: post-procedure vital signs reviewed and stable Respiratory status: spontaneous breathing Cardiovascular status: stable Postop Assessment: no apparent nausea or vomiting Anesthetic complications: no     Last Vitals:  Vitals:   10/17/17 1245 10/17/17 1300  BP: (!) 143/76 (!) 133/94  Resp: (!) 71 (!) 35  Temp:    SpO2: 99% 99%    Last Pain:  Vitals:   10/17/17 1145  TempSrc: Oral  PainSc: 7                  ADAMS, AMY A

## 2017-10-17 NOTE — Interval H&P Note (Signed)
History and Physical Interval Note:  10/17/2017 12:52 PM  [name]  has presented today for surgery, with the diagnosis of left total knee replacement  The various methods of treatment have been discussed with the patient and family. After consideration of risks, benefits and other options for treatment, the patient has consented to  Procedure(s) with comments: TOTAL KNEE ARTHROPLASTY (Left) - [place]  as a surgical intervention .  The patient's history has been reviewed, patient examined, no change in status, stable for surgery.  I have reviewed the patient's chart and labs.  Questions were answered to the patient's satisfaction.     Arther Abbott

## 2017-10-17 NOTE — Anesthesia Preprocedure Evaluation (Addendum)
Anesthesia Evaluation  Patient identified by MRN, date of birth, ID band Patient awake    Reviewed: Allergy & Precautions, NPO status , Patient's Chart, lab work & pertinent test results  History of Anesthesia Complications (+) PONV and history of anesthetic complications  Airway Mallampati: III  TM Distance: >3 FB Neck ROM: Full    Dental  (+) Teeth Intact   Pulmonary Current Smoker,    breath sounds clear to auscultation       Cardiovascular hypertension, Pt. on medications  Rhythm:Regular Rate:Normal     Neuro/Psych  Neuromuscular disease    GI/Hepatic   Endo/Other  diabetesDiet controlled   Renal/GU      Musculoskeletal  (+) Arthritis ,   Abdominal   Peds  Hematology   Anesthesia Other Findings Chronic back pain & sciatica. Known DM2, stopped metformin 1 yr ago, poorly controlled.  Reproductive/Obstetrics                            Anesthesia Physical Anesthesia Plan  ASA: II  Anesthesia Plan: Spinal   Post-op Pain Management:  Regional for Post-op pain   Induction:   PONV Risk Score and Plan: Ondansetron, Dexamethasone and Propofol infusion  Airway Management Planned:   Additional Equipment:   Intra-op Plan:   Post-operative Plan:   Informed Consent: I have reviewed the patients History and Physical, chart, labs and discussed the procedure including the risks, benefits and alternatives for the proposed anesthesia with the patient or authorized representative who has indicated his/her understanding and acceptance.     Plan Discussed with: CRNA  Anesthesia Plan Comments:        Anesthesia Quick Evaluation

## 2017-10-17 NOTE — Op Note (Addendum)
10/17/2017  3:25 PM  PATIENT:  [name]  47 y.o. female  PRE-OPERATIVE DIAGNOSIS:  osteoarthritis left knee  POST-OPERATIVE DIAGNOSIS:  osteoarthritis left knee  PROCEDURE:  Procedure(s) with comments: TOTAL KNEE ARTHROPLASTY (Left) - DePuy    Operative findings previous donor site and graft site on the lateral trochlea and medial femoral condyle respectively both healed normally.  The remaining portions of the knee looked completely normal except she had a large amount of scarring in the retropatellar region and suprapatellar pouch or retro-patellar space was completely obliterated with scar tissue and had to be excised  Implants Sigma fixed bearing total knee size 4 femur narrow size 3 tibia size 10 PS polyethylene with a 38 x 9 patella 2, 5.0 Arthrex suture anchors to reinforce patellar tendon insertion  Assisted by Simonne Maffucci and Fulton Mole  Abductor canal block for postop anesthesia and spinal anesthetic for intraoperative anesthesia SURGEON:  Surgeon(s) and Role:    Carole Civil, MD - Primary  EBL:  20 mL   BLOOD ADMINISTERED:none  DRAINS: none   LOCAL MEDICATIONS USED: Exparel 20 diluted with 40 cc of saline  SPECIMEN:  No Specimen  DISPOSITION OF SPECIMEN:  N/A  COUNTS:  YES  TOURNIQUET:   Total Tourniquet Time Documented: Thigh (Left) - 80 minutes Total: Thigh (Left) - 80 minutes   DICTATION: .Viviann Spare Dictation  PLAN OF CARE: Admit to inpatient   PATIENT DISPOSITION:  PACU - hemodynamically stable.   Delay start of Pharmacological VTE agent (>24hrs) due to surgical blood loss or risk of bleeding: yes  10/17/2017  3:25 PM  PATIENT:  [name]  47 y.o. female  PRE-OPERATIVE DIAGNOSIS:  osteoarthritis left knee  POST-OPERATIVE DIAGNOSIS:  osteoarthritis left knee  PROCEDURE:  Procedure(s) with comments: TOTAL KNEE ARTHROPLASTY (Left) - DePuy    Operative findings previous donor site and graft site on the lateral trochlea and  medial femoral condyle respectively both healed normally.  The remaining portions of the knee looked completely normal except she had a large amount of scarring in the retropatellar region and suprapatellar pouch or retro-patellar space was completely obliterated with scar tissue and had to be excised  Implants Sigma fixed bearing total knee size 4 femur narrow size 3 tibia size 10 PS polyethylene with a 38 x 9 patella  Assisted by Simonne Maffucci and Fulton Mole  Abductor canal block for postop anesthesia and spinal anesthetic for intraoperative anesthesia SURGEON:  Surgeon(s) and Role:    Carole Civil, MD - Primary  EBL:  20 mL   BLOOD ADMINISTERED:none  DRAINS: none   LOCAL MEDICATIONS USED: Exparel 20 diluted with 40 cc of saline  SPECIMEN:  No Specimen  DISPOSITION OF SPECIMEN:  N/A  COUNTS:  YES  TOURNIQUET:   Total Tourniquet Time Documented: Thigh (Left) - 80 minutes Total: Thigh (Left) - 80 minutes   DICTATION: .Viviann Spare Dictation  PLAN OF CARE: Admit to inpatient   PATIENT DISPOSITION:  PACU - hemodynamically stable.   Delay start of Pharmacological VTE agent (>24hrs) due to surgical blood loss or risk of bleeding: yes     OPERATIVE FINDINGS:  See brief op note   The patient was identified by 2 approved identification mechanisms. The operative extremity was evaluated and found to be acceptable for surgical treatment today. The chart was reviewed. The surgical site was confirmed and marked.  The patient was taken to the operating room and given appropriate antibiotic Ancef 2 g. This is consistent  with the SCIP protocol.  The patient was given the following anesthetic: Spinal anesthetic and abductor canal block  The patient was then placed supine on the operating table. A Foley catheter was inserted. The operative extremity was prepped and draped sterilely from the toes to the groin.  Timeout procedure was executed confirming the patient's name,  surgical site, antibiotic administration, x-rays available, and implants available.  The operative limb,  was exsanguinated with a six-inch Esmarch and the tourniquet was inflated to 300 mmHg.  A straight midline incision was made over the left KNEE and taken down to the extensor mechanism. A medial arthrotomy was performed. The patella was everted and the patellofemoral band was released. The anterior cruciate ligament and PCL were resected.  The anterior horns of the lateral and medial meniscus were resected. The medial soft tissue sleeve was elevated to the mid coronal plane.  A three-eighths inch drill bit was used to enter the femoral canal which was decompressed with suction and irrigation until clear. The distal femoral cutting guide was set for 11 mm distal resection,  5valgus alignment, for a left knee. The distal femur was resected and checked for flatness.  The external alignment guide for the tibial resection was then applied to the distal and proximal tibia and set for anatomic slope along with 10 MM resection  from the higher lateral side.   Rotational alignment was set using the malleolus, the tibial tubercle and the tibial spines.  The proximal tibia was resected along with  residual menisci.  Spacer block confirmed full extension with a 10 mm insert   The Depuy Sigma sizing femoral guide was placed and the femur was sized to a size 4. A 4-in-1 cutting block was placed along with collateral ligament retractors and the distal femoral cuts were completed by first performing anterior and posterior resections and then checking the flexion gap with a spacer block.  10 mm block balance the knee nicely and then the remaining cuts were made.   The tibia was sized using a base plate to a size 3.   Spacer blocks were used to confirm equal flexion extension gaps one final time with releases done as needed. A size 10 MM spacer block gave equal stability and flexion extension.  The correct  sized notch cutting guide for the femur was then applied and the notch cut was made.  Trial reduction was completed using 4 narrow femur 3 tibia 10 polyethylene PS insert trial implants. Patella tracking was normal  We then skeletonized the patella. It measured 24 in thickness and the patellar resection was set for 11 millimeters. the patellar resection was completed. The patella diameter measured 38 x 9. We then drilled the peg holes for the patella  The tibial insertion of the patellar tendon was scarred down and peeled away from the proximal tibia.  2, 5.0 Arthrex suture anchors were placed to reinforce the attachment  The proximal tibia was prepared using the size 3 base plate.  Thorough irrigation was performed and the bone was dried and prepared for cement. The cement was mixed on the back table using third generation preparation techniques  20 cc of dilute Exparel was injected into the soft tissues including the posterior capsule.  The implants were then cemented in place and excess cement was removed. The cement was allowed to cure. Irrigation was repeated and excess and residual bone fragments and cement were removed.    The extensor mechanism was closed with #1 Bralon suture followed  by subcutaneous tissue closure using 0 Monocryl suture  Skin approximation was performed using staples  A sterile dressing was applied, followed by a ace wrapped from foot to thigh and then a Cryo/Cuff which was activated   The patient was taken recovery room in stable condition

## 2017-10-17 NOTE — Brief Op Note (Addendum)
10/17/2017  3:25 PM  PATIENT:  Rosaura Carpenter  47 y.o. female  PRE-OPERATIVE DIAGNOSIS:  osteoarthritis left knee  POST-OPERATIVE DIAGNOSIS:  osteoarthritis left knee  PROCEDURE:  Procedure(s) with comments: TOTAL KNEE ARTHROPLASTY (Left) - DePuy    Operative findings previous donor site and graft site on the lateral trochlea and medial femoral condyle respectively both healed normally.  The remaining portions of the knee looked completely normal except she had a large amount of scarring in the retropatellar region and suprapatellar pouch or retro-patellar space was completely obliterated with scar tissue and had to be excised  Implants Sigma fixed bearing total knee size 4 femur narrow size 3 tibia size 10 PS polyethylene with a 38 x 9 patella  Assisted by Simonne Maffucci and Fulton Mole  Abductor canal block for postop anesthesia and spinal anesthetic for intraoperative anesthesia SURGEON:  Surgeon(s) and Role:    Carole Civil, MD - Primary  EBL:  20 mL   BLOOD ADMINISTERED:none  DRAINS: none   LOCAL MEDICATIONS USED: Exparel 20 diluted with 40 cc of saline  SPECIMEN:  No Specimen  DISPOSITION OF SPECIMEN:  N/A  COUNTS:  YES  TOURNIQUET:   Total Tourniquet Time Documented: Thigh (Left) - 80 minutes Total: Thigh (Left) - 80 minutes   DICTATION: .Viviann Spare Dictation  PLAN OF CARE: Admit to inpatient   PATIENT DISPOSITION:  PACU - hemodynamically stable.   Delay start of Pharmacological VTE agent (>24hrs) due to surgical blood loss or risk of bleeding: yes

## 2017-10-17 NOTE — Anesthesia Procedure Notes (Addendum)
Procedure Name: MAC Date/Time: 10/17/2017 1:10 PM Performed by: Andree Elk Amy A, CRNA Pre-anesthesia Checklist: Patient identified, Timeout performed, Emergency Drugs available, Suction available and Patient being monitored Oxygen Delivery Method: Nasal cannula

## 2017-10-17 NOTE — Transfer of Care (Signed)
Immediate Anesthesia Transfer of Care Note  Patient: [name]  Procedure(s) Performed: TOTAL KNEE ARTHROPLASTY (Left Knee)  Patient Location: PACU  Anesthesia Type:Spinal  Level of Consciousness: awake, alert , oriented and patient cooperative  Airway & Oxygen Therapy: Patient Spontanous Breathing  Post-op Assessment: Report given to RN and Post -op Vital signs reviewed and stable  Post vital signs: Reviewed and stable  Last Vitals:  Vitals Value Taken Time  BP 114/70 10/17/2017  3:28 PM  Temp    Pulse 64 10/17/2017  3:29 PM  Resp 9 10/17/2017  3:29 PM  SpO2 99 % 10/17/2017  3:29 PM  Vitals shown include unvalidated device data.  Last Pain:  Vitals:   10/17/17 1145  TempSrc: Oral  PainSc: 7       Patients Stated Pain Goal: 7 (74/25/95 [id number])  Complications: No apparent anesthesia complications

## 2017-10-17 NOTE — Anesthesia Procedure Notes (Signed)
Anesthesia Regional Block: Adductor canal block   Pre-Anesthetic Checklist: ,, timeout performed, Correct Patient, Correct Site, Correct Laterality, Correct Procedure, Correct Position, site marked, Risks and benefits discussed,  Surgical consent,  Pre-op evaluation,  At surgeon's request and post-op pain management  Laterality: Left  Prep: chloraprep       Needles:  Injection technique: Single-shot  Needle Type: Stimulator Needle - 80     Needle Length: 4cm  Needle Gauge: 22     Additional Needles:   Procedures:,,,, ultrasound used (permanent image in chart),,,,  Narrative:  Start time: 10/17/2017 1:10 PM End time: 10/17/2017 1:10 PM  Performed by: Personally   Additional Notes: Ropivicaine 0.25% with 8 mg decadron with total volume of 30cc incrementally injected

## 2017-10-18 ENCOUNTER — Encounter (HOSPITAL_COMMUNITY): Payer: Self-pay | Admitting: Orthopedic Surgery

## 2017-10-18 LAB — BASIC METABOLIC PANEL
ANION GAP: 8 (ref 5–15)
BUN: 9 mg/dL (ref 6–20)
CALCIUM: 8.6 mg/dL — AB (ref 8.9–10.3)
CO2: 23 mmol/L (ref 22–32)
Chloride: 107 mmol/L (ref 98–111)
Creatinine, Ser: 0.57 mg/dL (ref 0.44–1.00)
Glucose, Bld: 189 mg/dL — ABNORMAL HIGH (ref 70–99)
Potassium: 4.2 mmol/L (ref 3.5–5.1)
SODIUM: 138 mmol/L (ref 135–145)

## 2017-10-18 LAB — CBC
HEMATOCRIT: 43.1 % (ref 36.0–46.0)
Hemoglobin: 13.9 g/dL (ref 12.0–15.0)
MCH: 29 pg (ref 26.0–34.0)
MCHC: 32.3 g/dL (ref 30.0–36.0)
MCV: 90 fL (ref 78.0–100.0)
Platelets: 303 10*3/uL (ref 150–400)
RBC: 4.79 MIL/uL (ref 3.87–5.11)
RDW: 13.7 % (ref 11.5–15.5)
WBC: 18.7 10*3/uL — AB (ref 4.0–10.5)

## 2017-10-18 MED ORDER — OXYCODONE HCL 5 MG PO TABS
10.0000 mg | ORAL_TABLET | ORAL | Status: DC | PRN
  Administered 2017-10-18 – 2017-10-19 (×4): 10 mg via ORAL
  Filled 2017-10-18 (×5): qty 2

## 2017-10-18 MED ORDER — OXYCODONE HCL ER 20 MG PO T12A
20.0000 mg | EXTENDED_RELEASE_TABLET | Freq: Two times a day (BID) | ORAL | Status: DC
  Administered 2017-10-18 – 2017-10-20 (×5): 20 mg via ORAL
  Filled 2017-10-18 (×5): qty 1

## 2017-10-18 NOTE — Progress Notes (Signed)
Physical Therapy Treatment Patient Details Name: [name] MRN: [id number] DOB: 09-24-1970 Today's Date: 10/18/2017    History of Present Illness 47 yo female s/p left TKA on 10/17/17, h/o arthroscopic surgeries.    PT Comments     Progressing well. Tolerating weight on left leg during ambulation. Has been in zero knee foam since last PT visit. Plans Dc home tomorrow.   Follow Up Recommendations  Follow surgeon's recommendation for DC plan and follow-up therapies;Home health PT     Equipment Recommendations  None recommended by PT    Recommendations for Other Services       Precautions / Restrictions Precautions Precautions: Knee;Fall Precaution Comments: use zero knee Restrictions Weight Bearing Restrictions: No    Mobility  Bed Mobility Overal bed mobility: Modified Independent             General bed mobility comments: on bed edge  Transfers   Equipment used: Rolling walker (2 wheeled) Transfers: Sit to/from Stand Sit to Stand: Supervision            Ambulation/Gait Ambulation/Gait assistance: Supervision Gait Distance (Feet): 150 Feet Assistive device: Rolling walker (2 wheeled) Gait Pattern/deviations: Step-through pattern     General Gait Details: patient making effort to increase weight on the left leg    Stairs             Wheelchair Mobility    Modified Rankin (Stroke Patients Only)       Balance                                            Cognition Arousal/Alertness: Awake/alert                                            Exercises    General Comments        Pertinent Vitals/Pain Pain Score: 5  Pain Location: left knee Pain Descriptors / Indicators: Aching;Grimacing;Guarding;Tightness;Throbbing Pain Intervention(s): Monitored during session;Premedicated before session    Home Living                      Prior Function            PT Goals (current goals can  now be found in the care plan section) Progress towards PT goals: Progressing toward goals    Frequency    7X/week      PT Plan Current plan remains appropriate    Co-evaluation              AM-PAC PT "6 Clicks" Daily Activity  Outcome Measure  Difficulty turning over in bed (including adjusting bedclothes, sheets and blankets)?: None Difficulty moving from lying on back to sitting on the side of the bed? : None Difficulty sitting down on and standing up from a chair with arms (e.g., wheelchair, bedside commode, etc,.)?: A Little Help needed moving to and from a bed to chair (including a wheelchair)?: A Little Help needed walking in hospital room?: A Little Help needed climbing 3-5 steps with a railing? : A Lot 6 Click Score: 15    End of Session   Activity Tolerance: Patient tolerated treatment well Patient left: in bed;with call bell/phone within reach Nurse Communication: Mobility status PT Visit Diagnosis: Unsteadiness on feet (R26.81);Pain  Pain - Right/Left: Left Pain - part of body: Knee     Time: 1610-1630 PT Time Calculation (min) (ACUTE ONLY): 20 min  Charges:  $Gait Training: 8-22 mins                    G CodesTresa [name] PT [phone]    [name] 10/18/2017, 4:37 PM

## 2017-10-18 NOTE — Progress Notes (Signed)
Patient ID: [name], female   DOB: 10/08/1970, 47 y.o.   MRN: [id number]  Postop day 1 left total knee  BP 134/70 (BP Location: Left Arm)   Pulse 64   Temp 98.5 F (36.9 C) (Oral)   Resp 15   Ht 5\' 5"  (1.651 m)   Wt 190 lb (86.2 kg)   SpO2 100%   BMI 31.62 kg/m   Patient complains of severe pain despite being on heavy opioids.  Patient may have opioid tolerance.  Neurovascular exam looks intact observed her walking weight-bear as tolerated.  That was her first PT intervention.  I will change her pain medications to try to get her some pain relief.  Continue physical therapy work on range of motion and weightbearing  CBC Latest Ref Rng & Units 10/18/2017 10/12/2017 04/21/2017  WBC 4.0 - 10.5 K/uL 18.7(H) 12.2(H) 12.1(H)  Hemoglobin 12.0 - 15.0 g/dL 13.9 16.1(H) 15.7(H)  Hematocrit 36.0 - 46.0 % 43.1 47.8(H) 47.0(H)  Platelets 150 - 400 K/uL 303 288 330   BMP Latest Ref Rng & Units 10/18/2017 10/12/2017 04/21/2017  Glucose 70 - 99 mg/dL 189(H) 96 205(H)  BUN 6 - 20 mg/dL 9 14 17   Creatinine 0.44 - 1.00 mg/dL 0.57 0.45 0.50  Sodium 135 - 145 mmol/L 138 135 136  Potassium 3.5 - 5.1 mmol/L 4.2 4.2 3.9  Chloride 98 - 111 mmol/L 107 103 102  CO2 22 - 32 mmol/L 23 22 21(L)  Calcium 8.9 - 10.3 mg/dL 8.6(L) 9.0 9.8

## 2017-10-18 NOTE — Progress Notes (Signed)
Physical Therapy Treatment Patient Details Name: [name] MRN: [id number] DOB: 10-28-1970 Today's Date: 10/18/2017    History of Present Illness 47 yo female s/p left TKA on 10/17/17, h/o arthroscopic surgeries.    PT Comments     progressing with AROM left knee, using zero need foam. Plan to ambulate  Later today. Just ambulated to BR prior to this visit.   Follow Up Recommendations  Follow surgeon's recommendation for DC plan and follow-up therapies;Home health PT     Equipment Recommendations  None recommended by PT    Recommendations for Other Services       Precautions / Restrictions Precautions Precautions: Knee;Fall Precaution Comments: use zero knee Restrictions Weight Bearing Restrictions: No    Mobility  Bed Mobility Overal bed mobility: Modified Independent                Transfers                    Ambulation/Gait                 Stairs             Wheelchair Mobility    Modified Rankin (Stroke Patients Only)       Balance                                            Cognition Arousal/Alertness: Awake/alert                                            Exercises Total Joint Exercises Quad Sets: AROM;10 reps Short Arc QuadSinclair Ship;Left;10 reps;Supine Heel Slides: AAROM;Left;10 reps;Supine Hip ABduction/ADduction: AROM;Left;10 reps Straight Leg Raises: AAROM;Left;10 reps    General Comments        Pertinent Vitals/Pain Pain Score: 9  Pain Location: left knee Pain Descriptors / Indicators: Aching;Grimacing;Guarding;Tightness;Throbbing Pain Intervention(s): Monitored during session;Premedicated before session;Patient requesting pain meds-RN notified;Limited activity within patient's tolerance;Ice applied    Home Living                      Prior Function            PT Goals (current goals can now be found in the care plan section) Progress towards PT  goals: Progressing toward goals    Frequency    7X/week      PT Plan Current plan remains appropriate    Co-evaluation              AM-PAC PT "6 Clicks" Daily Activity  Outcome Measure  Difficulty turning over in bed (including adjusting bedclothes, sheets and blankets)?: None Difficulty moving from lying on back to sitting on the side of the bed? : None Difficulty sitting down on and standing up from a chair with arms (e.g., wheelchair, bedside commode, etc,.)?: A Little Help needed moving to and from a bed to chair (including a wheelchair)?: A Little Help needed walking in hospital room?: A Little Help needed climbing 3-5 steps with a railing? : A Lot 6 Click Score: 19    End of Session   Activity Tolerance: Patient tolerated treatment well Patient left: in bed;with call bell/phone within reach Nurse Communication: Mobility status PT Visit Diagnosis: Unsteadiness on feet (R26.81);Pain Pain -  Right/Left: Left Pain - part of body: Knee     Time: 1340-1405 PT Time Calculation (min) (ACUTE ONLY): 25 min  Charges:  $Therapeutic Exercise: 23-37 mins                    G CodesTresa Endo PT [phone]    Claretha Cooper 10/18/2017, 3:36 PM

## 2017-10-18 NOTE — Care Management Note (Signed)
Case Management Note  Patient Details  Name: VALLEY KE MRN: [id number] Date of Birth: 09-Jul-1970  Subjective/Objective:     S/p tka. From home. Ortho office sent [facility] referral to [facility] and CPM order to [place]. Per rep, [facility]ella Scheuermann would not pay for CPM, pt refused DME. MD is aware. Per PT pt has no other DME neds.               Action/Plan: DC home tomorrow with HH thorough [facility]. HH rep to visit with pt while in hospital. [facility] will get authorization from [place] for services.   Expected Discharge Date:       10/19/17           Expected Discharge Plan:  [place]  In-House Referral:  NA  Discharge planning Services  CM Consult  Post Acute Care Choice:  NA Choice offered to:  NA  HH Arranged:  PT HH Agency:  [facility] (formerly [facility])  Status of Service:  Completed, signed off  If discussed at [employer] of Stay Meetings, dates discussed:    Additional Comments:  Sherald Barge, RN 10/18/2017, 1:45 PM

## 2017-10-18 NOTE — Evaluation (Signed)
Physical Therapy Evaluation Patient Details Name: [name] MRN: [id number] DOB: 21-Apr-1971 Today's Date: 10/18/2017   History of Present Illness  47 yo female s/p left TKA on 10/17/17, h/o arthroscopic surgeries.  Clinical Impression  THE PATIRENT COMPLAINS OF "25/10" PAIN.  Patient  reportsthat her pain is less with mobility. Working on   Knee ext1 Pt admitted with above diagnosis. Pt currently with functional limitations due to the deficits listed below (see PT Problem List).  Pt will benefit from skilled PT to increase their independence and safety with mobility to allow discharge to the venue listed below.   ension.   Follow Up Recommendations Follow surgeon's recommendation for DC plan and follow-up therapies    Equipment Recommendations  None recommended by PT    Recommendations for Other Services       Precautions / Restrictions Precautions Precautions: Knee;Fall Precaution Comments: order in, no KI in room Required Braces or Orthoses: Knee Immobilizer - Left Knee Immobilizer - Left: On when out of bed or walking      Mobility  Bed Mobility Overal bed mobility: Needs Assistance Bed Mobility: Supine to Sit     Supine to sit: Supervision     General bed mobility comments: self assisting the  left leg  Transfers Overall transfer level: Needs assistance Equipment used: Rolling walker (2 wheeled) Transfers: Sit to/from Stand Sit to Stand: Min guard         General transfer comment: cues for left leg  Ambulation/Gait Ambulation/Gait assistance: Min guard Gait Distance (Feet): 150 Feet Assistive device: Rolling walker (2 wheeled) Gait Pattern/deviations: Step-to pattern;Step-through pattern     General Gait Details: cues for increasing WB.  Stairs            Wheelchair Mobility    Modified Rankin (Stroke Patients Only)       Balance                                             Pertinent Vitals/Pain Pain Assessment:  0-10 Pain Location: "25" left knee Pain Descriptors / Indicators: Aching;Crying;Grimacing;Guarding;Tightness;Throbbing Pain Intervention(s): Premedicated before session;Monitored during session;Limited activity within patient's tolerance;Repositioned;Ice applied;Patient requesting pain meds-RN notified    Home Living Family/patient expects to be discharged to:: Private residence Living Arrangements: Spouse/significant other Available Help at Discharge: Family Type of Home: House Home Access: Level entry     Home Layout: One level Home Equipment: Environmental consultant - standard;Crutches      Prior Function Level of Independence: Independent               Hand Dominance        Extremity/Trunk Assessment   Upper Extremity Assessment Upper Extremity Assessment: Overall WFL for tasks assessed    Lower Extremity Assessment Lower Extremity Assessment: LLE deficits/detail LLE Deficits / Details: 10-50  knee flex    Cervical / Trunk Assessment Cervical / Trunk Assessment: Normal  Communication   Communication: No difficulties  Cognition Arousal/Alertness: Awake/alert Behavior During Therapy: WFL for tasks assessed/performed Overall Cognitive Status: Within Functional Limits for tasks assessed                                        General Comments      Exercises Total Joint Exercises Ankle Circles/Pumps: AROM;Both;10 reps Quad Sets: AROM;Both  Long Arc Quad: AAROM;Left;10 reps Knee Flexion: AAROM;Left;10 reps   Assessment/Plan    PT Assessment Patient needs continued PT services  PT Problem List Decreased strength;Decreased range of motion;Decreased knowledge of use of DME;Decreased safety awareness;Decreased activity tolerance;Decreased knowledge of precautions;Decreased mobility;Pain       PT Treatment Interventions DME instruction;Gait training;Stair training;Functional mobility training;Therapeutic activities;Therapeutic exercise;Patient/family  education    PT Goals (Current goals can be found in the Care Plan section)  Acute Rehab PT Goals Patient Stated Goal: to bend my knee, go bCK TO WORK PT Goal Formulation: With patient Time For Goal Achievement: 10/21/17 Potential to Achieve Goals: Good    Frequency 7X/week   Barriers to discharge        Co-evaluation               AM-PAC PT "6 Clicks" Daily Activity  Outcome Measure Difficulty turning over in bed (including adjusting bedclothes, sheets and blankets)?: A Little Difficulty moving from lying on back to sitting on the side of the bed? : A Little Difficulty sitting down on and standing up from a chair with arms (e.g., wheelchair, bedside commode, etc,.)?: A Little Help needed moving to and from a bed to chair (including a wheelchair)?: A Little Help needed walking in hospital room?: A Little Help needed climbing 3-5 steps with a railing? : A Lot 6 Click Score: 17    End of Session   Activity Tolerance: Patient tolerated treatment well Patient left: in chair;with call bell/phone within reach Nurse Communication: Mobility status PT Visit Diagnosis: Unsteadiness on feet (R26.81);Pain Pain - Right/Left: Left Pain - part of body: Knee    Time: 0752-0830 PT Time Calculation (min) (ACUTE ONLY): 38 min   Charges:   PT Evaluation $PT Eval Low Complexity: 1 Low PT Treatments $Gait Training: 8-22 mins $Therapeutic Exercise: 8-22 mins   PT G CodesTresa Endo PT [phone]  Claretha Cooper 10/18/2017, 8:55 AM

## 2017-10-19 LAB — CBC
HEMATOCRIT: 41.6 % (ref 36.0–46.0)
HEMOGLOBIN: 13.6 g/dL (ref 12.0–15.0)
MCH: 29.5 pg (ref 26.0–34.0)
MCHC: 32.7 g/dL (ref 30.0–36.0)
MCV: 90.2 fL (ref 78.0–100.0)
Platelets: 286 10*3/uL (ref 150–400)
RBC: 4.61 MIL/uL (ref 3.87–5.11)
RDW: 14 % (ref 11.5–15.5)
WBC: 17.6 10*3/uL — AB (ref 4.0–10.5)

## 2017-10-19 MED ORDER — HYDROMORPHONE HCL 1 MG/ML IJ SOLN: 0.5000 mg | INTRAMUSCULAR | Status: DC | PRN

## 2017-10-19 MED ORDER — KETOROLAC TROMETHAMINE 30 MG/ML IJ SOLN
30.0000 mg | Freq: Four times a day (QID) | INTRAMUSCULAR | Status: DC
  Administered 2017-10-19 – 2017-10-20 (×5): 30 mg via INTRAVENOUS
  Filled 2017-10-19 (×5): qty 1

## 2017-10-19 MED ORDER — KETOROLAC TROMETHAMINE 30 MG/ML IJ SOLN: 30.0000 mg | Freq: Four times a day (QID) | INTRAMUSCULAR | Status: DC

## 2017-10-19 MED ORDER — OXYCODONE HCL 5 MG PO TABS
5.0000 mg | ORAL_TABLET | ORAL | Status: DC
  Administered 2017-10-19 – 2017-10-20 (×7): 5 mg via ORAL
  Filled 2017-10-19 (×7): qty 1

## 2017-10-19 MED ORDER — OXYCODONE-ACETAMINOPHEN 5-325 MG PO TABS
1.0000 | ORAL_TABLET | ORAL | Status: DC
  Administered 2017-10-19 – 2017-10-20 (×7): 1 via ORAL
  Filled 2017-10-19 (×7): qty 1

## 2017-10-19 NOTE — Progress Notes (Signed)
Physical Therapy Treatment Patient Details Name: [name] MRN: [id number] DOB: 1970-09-24 Today's Date: 10/19/2017    History of Present Illness 47 yo female s/p left TKA on 10/17/17, h/o arthroscopic surgeries.    PT Comments    Patient reports  Pain is better controlled. tolerating knee flexion  To 60*. Plans DC tomorrow   Follow Up Recommendations  Follow surgeon's recommendation for DC plan and follow-up therapies;Home health PT     Equipment Recommendations  None recommended by PT    Recommendations for Other Services       Precautions / Restrictions Precautions Precautions: Knee;Fall Precaution Comments: use zero knee foam    Mobility  Bed Mobility Overal bed mobility: Modified Independent                Transfers Overall transfer level: Modified independent                  Ambulation/Gait Ambulation/Gait assistance: Supervision Gait Distance (Feet): 150 Feet Assistive device: Rolling walker (2 wheeled) Gait Pattern/deviations: Step-through pattern     General Gait Details: patient making effort to increase weight on the left leg    Stairs             Wheelchair Mobility    Modified Rankin (Stroke Patients Only)       Balance                                            Cognition Arousal/Alertness: Awake/alert                                            Exercises Total Joint Exercises Long Arc QuadSinclair Ship;Left;10 reps Knee Flexion: AAROM;Left;10 reps    General Comments        Pertinent Vitals/Pain Pain Score: 4  Pain Location: left knee Pain Descriptors / Indicators: Discomfort;Grimacing;Throbbing Pain Intervention(s): Monitored during session;Premedicated before session;Repositioned    Home Living                      Prior Function            PT Goals (current goals can now be found in the care plan section) Progress towards PT goals: Progressing toward  goals    Frequency    7X/week      PT Plan Current plan remains appropriate    Co-evaluation              AM-PAC PT "6 Clicks" Daily Activity  Outcome Measure  Difficulty turning over in bed (including adjusting bedclothes, sheets and blankets)?: None Difficulty moving from lying on back to sitting on the side of the bed? : None Difficulty sitting down on and standing up from a chair with arms (e.g., wheelchair, bedside commode, etc,.)?: None Help needed moving to and from a bed to chair (including a wheelchair)?: A Little Help needed walking in hospital room?: A Little Help needed climbing 3-5 steps with a railing? : A Lot 6 Click Score: 20    End of Session   Activity Tolerance: Patient tolerated treatment well Patient left: in chair;with call bell/phone within reach Nurse Communication: Mobility status PT Visit Diagnosis: Unsteadiness on feet (R26.81) Pain - Right/Left: Left Pain - part of body: Knee  Time: 1308-6578 PT Time Calculation (min) (ACUTE ONLY): 17 min  Charges:  $Gait Training: 8-22 mins                    G CodesTresa Endo PT [phone]    Claretha Cooper 10/19/2017, 4:41 PM

## 2017-10-19 NOTE — Progress Notes (Signed)
CPhysical Therapy Treatment Patient Details Name: BRIANN SARCHET MRN: [id number] DOB: 1971-02-13 Today's Date: 10/19/2017    History of Present Illness 47 yo female s/p left TKA on 10/17/17, h/o arthroscopic surgeries.    PT Comments    Patient reports having   Issues with pain control. DC postponed until tomorrow.  Patient  With noted decreased ewxtension. Using zero knee as tolerated.  Follow Up Recommendations  Follow surgeon's recommendation for DC plan and follow-up therapies;Home health PT     Equipment Recommendations  None recommended by PT    Recommendations for Other Services       Precautions / Restrictions Precautions Precautions: Knee;Fall Restrictions Weight Bearing Restrictions: No    Mobility  Bed Mobility Overal bed mobility: Modified Independent                Transfers   Equipment used: Rolling walker (2 wheeled)   Sit to Stand: Supervision            Ambulation/Gait Ambulation/Gait assistance: Supervision Gait Distance (Feet): 150 Feet Assistive device: Rolling walker (2 wheeled) Gait Pattern/deviations: Step-through pattern     General Gait Details: patient making effort to increase weight on the left leg    Stairs             Wheelchair Mobility    Modified Rankin (Stroke Patients Only)       Balance                                            Cognition Arousal/Alertness: Awake/alert                                            Exercises Total Joint Exercises Ankle Circles/Pumps: AROM;Both;10 reps Quad Sets: AROM;10 reps;Both Short Arc Quad: AAROM;Left;10 reps;Supine Heel Slides: AAROM;Left;10 reps;Supine Hip ABduction/ADduction: AROM;Left;10 reps Straight Leg Raises: AAROM;Left;10 reps Goniometric ROM: 10-50 left knee flexion    General Comments        Pertinent Vitals/Pain Pain Score: 6  Pain Location: left knee Pain Descriptors / Indicators:  Aching;Grimacing;Guarding;Tightness;Throbbing Pain Intervention(s): Monitored during session;Premedicated before session;Repositioned    Home Living                      Prior Function            PT Goals (current goals can now be found in the care plan section) Progress towards PT goals: Progressing toward goals    Frequency    7X/week      PT Plan Current plan remains appropriate    Co-evaluation              AM-PAC PT "6 Clicks" Daily Activity  Outcome Measure  Difficulty turning over in bed (including adjusting bedclothes, sheets and blankets)?: None Difficulty moving from lying on back to sitting on the side of the bed? : None Difficulty sitting down on and standing up from a chair with arms (e.g., wheelchair, bedside commode, etc,.)?: A Little Help needed moving to and from a bed to chair (including a wheelchair)?: A Little Help needed walking in hospital room?: A Little Help needed climbing 3-5 steps with a railing? : A Lot 6 Click Score: 19    End of Session   Activity Tolerance:  Patient tolerated treatment well Patient left: in bed Nurse Communication: Mobility status PT Visit Diagnosis: Unsteadiness on feet (R26.81);Pain Pain - Right/Left: Left Pain - part of body: Knee     Time: 1018-1050 PT Time Calculation (min) (ACUTE ONLY): 32 min  Charges:  $Gait Training: 8-22 mins $Therapeutic Exercise: 8-22 mins                    G CodesTresa Endo PT [phone]    Claretha Cooper 10/19/2017, 11:24 AM

## 2017-10-19 NOTE — Clinical Social Work Note (Signed)
Patient is going home with HHPT.  LCSW signing off.     Henning Ehle D, LCSW  

## 2017-10-19 NOTE — Progress Notes (Signed)
Patient ID: [name], female   DOB: 1971-01-01, 47 y.o.   MRN: [id number]  Postoperative day #2 status post left total knee replacement BP (!) 174/92 (BP Location: Left Arm)   Pulse 73   Temp 98.2 F (36.8 C) (Oral)   Resp 17   Ht 5\' 5"  (1.651 m)   Wt 190 lb (86.2 kg)   SpO2 98%   BMI 31.62 kg/m     She complains of severe extreme excruciating pain.  However, once questioned about it she is complaining of throbbing pain in that there is a heartbeat in her knee.  Sharp pain is not an issue.  We did change her medication to oxycodone every 12 which seems to be working for at least 6 hours of pain control but the oxycodone IR is not helping at all  So I will stop that  I put her on Percocet 10 mg with 325 of Tylenol every 4 scheduled and put her on ketorolac change the dressing applied ice.  The ketorolac and the dressing change with the eye seems to be doing the best  She is progressing with her gait  Discharge delayed one day to try to get pain control  Continue therapy  Her leg actually looks very good she has minimal knee swelling no ankle edema and she is ambulating with therapy

## 2017-10-20 LAB — CBC
HCT: 39.9 % (ref 36.0–46.0)
Hemoglobin: 13 g/dL (ref 12.0–15.0)
MCH: 29.5 pg (ref 26.0–34.0)
MCHC: 32.6 g/dL (ref 30.0–36.0)
MCV: 90.5 fL (ref 78.0–100.0)
PLATELETS: 258 10*3/uL (ref 150–400)
RBC: 4.41 MIL/uL (ref 3.87–5.11)
RDW: 14 % (ref 11.5–15.5)
WBC: 13.6 10*3/uL — ABNORMAL HIGH (ref 4.0–10.5)

## 2017-10-20 MED ORDER — OXYCODONE-ACETAMINOPHEN 10-325 MG PO TABS: 1.0000 | ORAL_TABLET | ORAL | 0 refills | Status: DC | PRN

## 2017-10-20 MED ORDER — SENNOSIDES-DOCUSATE SODIUM 8.6-50 MG PO TABS: 1.0000 | ORAL_TABLET | Freq: Every evening | ORAL | 0 refills | Status: DC | PRN

## 2017-10-20 MED ORDER — KETOROLAC TROMETHAMINE 10 MG PO TABS: 10.0000 mg | ORAL_TABLET | Freq: Four times a day (QID) | ORAL | 0 refills | Status: DC | PRN

## 2017-10-20 MED ORDER — OXYCODONE HCL ER 20 MG PO T12A: 20.0000 mg | EXTENDED_RELEASE_TABLET | Freq: Two times a day (BID) | ORAL | 0 refills | Status: DC

## 2017-10-20 MED ORDER — METHOCARBAMOL 500 MG PO TABS: 500.0000 mg | ORAL_TABLET | Freq: Four times a day (QID) | ORAL | 5 refills | Status: DC | PRN

## 2017-10-20 MED ORDER — ASPIRIN 325 MG PO TBEC: 325.0000 mg | DELAYED_RELEASE_TABLET | Freq: Every day | ORAL | 0 refills | Status: DC

## 2017-10-20 NOTE — Discharge Summary (Signed)
Physician Discharge Summary  Patient ID: [name] MRN: [id number] DOB/AGE: [birth year] 47 y.o.  Admit date: 10/17/2017 Discharge date: 10/20/2017  Admission Diagnoses: Primary osteoarthritis left knee Discharge Diagnoses: Active Problems:   Primary osteoarthritis of left knee Same  Discharged Condition: good  Hospital Course:  Date of admission June 25, uncomplicated left total knee arthroplasty Patient had significant difficulties controlling pain.  We eventually settled on oxycodone 20 mg every 12 with Percocet q. 10 Robaxin and Toradol.  She had more throbbing than actual sharp pain she was able to ambulate tolerated physical therapy but needed high doses of medication for pain control.  She was ambulatory afebrile with a stable wound on June 28 and was discharged home in stable condition  At discharge range of motion 10-60  Gait 150 feet  Consults: None  Significant Diagnostic Studies: labs:  CBC Latest Ref Rng & Units 10/20/2017 10/19/2017 10/18/2017  WBC 4.0 - 10.5 K/uL 13.6(H) 17.6(H) 18.7(H)  Hemoglobin 12.0 - 15.0 g/dL 13.0 13.6 13.9  Hematocrit 36.0 - 46.0 % 39.9 41.6 43.1  Platelets 150 - 400 K/uL 258 286 303   BMP Latest Ref Rng & Units 10/18/2017 10/12/2017 04/21/2017  Glucose 70 - 99 mg/dL 189(H) 96 205(H)  BUN 6 - 20 mg/dL 9 14 17   Creatinine 0.44 - 1.00 mg/dL 0.57 0.45 0.50  Sodium 135 - 145 mmol/L 138 135 136  Potassium 3.5 - 5.1 mmol/L 4.2 4.2 3.9  Chloride 98 - 111 mmol/L 107 103 102  CO2 22 - 32 mmol/L 23 22 21(L)  Calcium 8.9 - 10.3 mg/dL 8.6(L) 9.0 9.8     Treatments: surgery: Left total knee [place] implants Sigma fixed bearing posterior stabilized size 4 femur size 3 tibia size 10 polyethylene insert size 38 patella  Discharge Exam: Blood pressure (!) 142/72, pulse 76, temperature 98.5 F (36.9 C), temperature source Oral, resp. rate 18, height 5\' 5"  (1.651 m), weight 190 lb (86.2 kg), SpO2 100 %.   Disposition:    Allergies as of 10/20/2017       Reactions   Flagyl [metronidazole] Nausea Only   Hydrocodone Nausea And Vomiting, Other (See Comments)   Can take need to take with medication for nausea and vomitting      Medication List    STOP taking these medications   acetaminophen 500 MG tablet Commonly known as:  TYLENOL   HYDROcodone-acetaminophen 5-325 MG tablet Commonly known as:  NORCO/VICODIN   ibuprofen 200 MG tablet Commonly known as:  ADVIL,MOTRIN     TAKE these medications   aspirin 325 MG EC tablet Take 1 tablet (325 mg total) by mouth daily with breakfast.   ketorolac 10 MG tablet Commonly known as:  TORADOL Take 1 tablet (10 mg total) by mouth every 6 (six) hours as needed.   methocarbamol 500 MG tablet Commonly known as:  ROBAXIN Take 1 tablet (500 mg total) by mouth every 6 (six) hours as needed for muscle spasms.   oxyCODONE 20 mg 12 hr tablet Commonly known as:  OXYCONTIN Take 1 tablet (20 mg total) by mouth every 12 (twelve) hours.   oxyCODONE-acetaminophen 10-325 MG tablet Commonly known as:  PERCOCET Take 1 tablet by mouth every 4 (four) hours as needed for pain.   promethazine 12.5 MG tablet Commonly known as:  PHENERGAN Take 1 tablet (12.5 mg total) by mouth every 6 (six) hours as needed for nausea or vomiting.   senna-docusate 8.6-50 MG tablet Commonly known as:  Senokot-S Take 1 tablet by  mouth at bedtime as needed for mild constipation.        Signed: Arther Abbott 10/20/2017, 8:12 AM

## 2017-10-20 NOTE — Care Management (Signed)
Tim Justis, [facility] [employer] rep, aware of DC home today. Will schedule to see pt tomorrow. Pt aware.

## 2017-10-20 NOTE — Progress Notes (Signed)
IV discontinued,catheter intact.Discharge instructions given on medications and follow up visits. Patient verbalized understanding. Prescriptions sent to Pharmacy of choice documented on AVS. Staff to accompany patient to an awaiting vehicle.

## 2017-10-20 NOTE — Progress Notes (Signed)
Physical Therapy Treatment Patient Details Name: [name] MRN: [id number] DOB: 1970/12/11 Today's Date: 10/20/2017    History of Present Illness 47 yo female s/p left TKA on 10/17/17, h/o arthroscopic surgeries.    PT Comments    Patient has improved pain control today and is received at edge of bed with RN taking vitals. She reports her family/cargiver will be bringer her crutches to for her to use when discharged. Educated patient to bring RW as well to have greater support if needed. Reviewed safe use of RW for gait and provided verbal cues to imrpove step pattern, reviewed HEP for initial ROM exercises. Anticipate patient will discharge today and is ready to transition to HHPT for further care. Acute PT will follow throughout inpatient stay.   Follow Up Recommendations  Follow surgeon's recommendation for DC plan and follow-up therapies;Home health PT     Equipment Recommendations  None recommended by PT    Recommendations for Other Services       Precautions / Restrictions Precautions Precautions: Knee;Fall Precaution Comments: use zero knee foam Restrictions Weight Bearing Restrictions: No    Mobility  Bed Mobility Overal bed mobility: Modified Independent     Transfers Overall transfer level: Modified independent Equipment used: Rolling walker (2 wheeled) Transfers: Sit to/from Stand Sit to Stand: Supervision   Ambulation/Gait Ambulation/Gait assistance: Supervision Gait Distance (Feet): 75 Feet Assistive device: Rolling walker (2 wheeled) Gait Pattern/deviations: Antalgic;Decreased stance time - left;Decreased stride length;Step-through pattern Gait velocity: slow gait   General Gait Details: verbal cues for use of RW to improve step pattern "push like a shopping cart", patient imrproved from step to pattern to step through and made attempt to increase WB on Lt LE       Balance Overall balance assessment: Needs assistance Sitting-balance support: No  upper extremity supported;Feet supported Sitting balance-Leahy Scale: Good     Standing balance support: Bilateral upper extremity supported Standing balance-Leahy Scale: Fair     Cognition Arousal/Alertness: Awake/alert Behavior During Therapy: WFL for tasks assessed/performed Overall Cognitive Status: Within Functional Limits for tasks assessed       Exercises Total Joint Exercises Ankle Circles/Pumps: AROM;10 reps;Left;Seated Long Arc Quad: AAROM;Left;10 reps;Seated Knee Flexion: AAROM;Left;10 reps;Seated    General Comments        Pertinent Vitals/Pain Pain Score: 3  Pain Location: left knee Pain Descriptors / Indicators: Discomfort;Grimacing;Throbbing Pain Intervention(s): Monitored during session;Limited activity within patient's tolerance           PT Goals (current goals can now be found in the care plan section) Acute Rehab PT Goals Patient Stated Goal: to bend my knee, go bCK TO WORK PT Goal Formulation: With patient Time For Goal Achievement: 10/21/17 Potential to Achieve Goals: Good Progress towards PT goals: Progressing toward goals    Frequency    7X/week      PT Plan Current plan remains appropriate       AM-PAC PT "6 Clicks" Daily Activity  Outcome Measure  Difficulty turning over in bed (including adjusting bedclothes, sheets and blankets)?: None Difficulty moving from lying on back to sitting on the side of the bed? : None Difficulty sitting down on and standing up from a chair with arms (e.g., wheelchair, bedside commode, etc,.)?: None Help needed moving to and from a bed to chair (including a wheelchair)?: A Little Help needed walking in hospital room?: A Little Help needed climbing 3-5 steps with a railing? : A Lot 6 Click Score: 20    End of Session  Activity Tolerance: Patient tolerated treatment well Patient left: with call bell/phone within reach;in bed Nurse Communication: Mobility status PT Visit Diagnosis: Unsteadiness  on feet (R26.81) Pain - Right/Left: Left Pain - part of body: Knee     Time: 2800-3491 PT Time Calculation (min) (ACUTE ONLY): 14 min  Charges:  $Therapeutic Exercise: 8-22 mins                    G Codes:       Kipp Brood, PT, DPT Physical Therapist with [facility]  10/20/2017 11:18 AM

## 2017-10-20 NOTE — Discharge Instructions (Signed)
Dressing keep clean and dry  Bone foam 1 hour 3 times a day  Self taught physical therapy exercises daily

## 2017-10-23 ENCOUNTER — Telehealth: Payer: Self-pay | Admitting: Radiology

## 2017-10-23 NOTE — Telephone Encounter (Signed)
Gave verbal order for the physical therapy home health 3x weekly for 2 weeks. [facility]. Written orders were faxed prior to surgery.

## 2017-10-24 ENCOUNTER — Telehealth: Payer: Self-pay | Admitting: Radiology

## 2017-10-24 NOTE — Telephone Encounter (Signed)
These were both denied by [employer] Oxycontin Alternatives are [facility]ys[facility]la and [facility]  Dr Aline Brochure does not prescribe these   The Oxycodone has to be prescrived by pain management, with naloxone, and dose can not exceed 150 MME

## 2017-10-24 NOTE — Telephone Encounter (Signed)
Patient called me today about prior auth for oxycodone and oxycontin. I have completed a prior auth request for these on cover my meds and submitted electronically.

## 2017-10-25 ENCOUNTER — Other Ambulatory Visit: Payer: Self-pay | Admitting: Orthopedic Surgery

## 2017-10-25 ENCOUNTER — Telehealth: Payer: Self-pay | Admitting: Orthopedic Surgery

## 2017-10-25 LAB — BPAM RBC
Blood Product Expiration Date: 201907252359
Blood Product Expiration Date: 201907252359
ISSUE DATE / TIME: 201906301400
UNIT TYPE AND RH: 5100
UNIT TYPE AND RH: 5100

## 2017-10-25 LAB — TYPE AND SCREEN
ABO/RH(D): B POS
Antibody Screen: NEGATIVE
Unit division: 0
Unit division: 0

## 2017-10-25 MED ORDER — OXYCODONE-ACETAMINOPHEN 10-325 MG PO TABS: 1.0000 | ORAL_TABLET | ORAL | 0 refills | Status: DC | PRN

## 2017-10-25 NOTE — Telephone Encounter (Signed)
No more Toradol I advised patient she has voiced understanding  I have also advised her insurance has declined the Oxycodone and Oxycontin she will have to pay out of pocket  She voiced understanding

## 2017-10-25 NOTE — Telephone Encounter (Signed)
Oxycodone-Acetaminophen 10/325 mg  Qty 42 Tablets  Take 1 tablet by mouth every 4 (four) hours as needed for pain.  PATIENT USES [place] [facility]

## 2017-10-25 NOTE — Telephone Encounter (Signed)
Toradol 10 MG  Qty 20 Tablets  Take 1 tablet (10 mg total) by mouth every 6 (six) hours as needed.  PATIENT USES [place] [facility]

## 2017-10-31 ENCOUNTER — Ambulatory Visit (INDEPENDENT_AMBULATORY_CARE_PROVIDER_SITE_OTHER): Payer: Self-pay | Admitting: Orthopedic Surgery

## 2017-10-31 ENCOUNTER — Encounter: Payer: Self-pay | Admitting: Orthopedic Surgery

## 2017-10-31 VITALS — BP 146/83 | HR 102 | Ht 65.0 in | Wt 190.0 lb

## 2017-10-31 DIAGNOSIS — Z96652 Presence of left artificial knee joint: Secondary | ICD-10-CM

## 2017-10-31 MED ORDER — OXYCODONE-ACETAMINOPHEN 10-325 MG PO TABS
1.0000 | ORAL_TABLET | ORAL | 0 refills | Status: DC | PRN
Start: 2017-10-31 — End: 2017-11-07

## 2017-10-31 NOTE — Progress Notes (Signed)
Chief Complaint  Patient presents with  . Post-op Follow-up    total knee replacement 10/17/17    Postop visit #1 postop day 14  Patient complains of pain on Percocet 10 and oxycodone 20 mg every 12 as well as ibuprofen 800-6  Patient is doing well she is improved her range of motion from 10-65-100  She is amatory with a cane  Her incision looks good staples were extracted  She will start outpatient therapy we refilled her pain medication follow-up in 2 weeks

## 2017-10-31 NOTE — Patient Instructions (Signed)
Work on the extension use the bone foam 3 times a day for 30 minutes work up to 30 minutes if he can do 30 minutes in a row  Start outpatient therapy  Return 2 weeks

## 2017-11-07 ENCOUNTER — Encounter (HOSPITAL_COMMUNITY): Payer: Self-pay

## 2017-11-07 ENCOUNTER — Other Ambulatory Visit: Payer: Self-pay | Admitting: Orthopedic Surgery

## 2017-11-07 ENCOUNTER — Ambulatory Visit (HOSPITAL_COMMUNITY): Payer: Managed Care, Other (non HMO) | Attending: Orthopedic Surgery

## 2017-11-07 ENCOUNTER — Other Ambulatory Visit: Payer: Self-pay

## 2017-11-07 DIAGNOSIS — R262 Difficulty in walking, not elsewhere classified: Secondary | ICD-10-CM | POA: Insufficient documentation

## 2017-11-07 DIAGNOSIS — M25562 Pain in left knee: Secondary | ICD-10-CM | POA: Insufficient documentation

## 2017-11-07 DIAGNOSIS — Z96652 Presence of left artificial knee joint: Secondary | ICD-10-CM

## 2017-11-07 DIAGNOSIS — M25662 Stiffness of left knee, not elsewhere classified: Secondary | ICD-10-CM | POA: Insufficient documentation

## 2017-11-07 DIAGNOSIS — M6281 Muscle weakness (generalized): Secondary | ICD-10-CM | POA: Insufficient documentation

## 2017-11-07 MED ORDER — OXYCODONE-ACETAMINOPHEN 10-325 MG PO TABS: 1.0000 | ORAL_TABLET | ORAL | 0 refills | Status: DC | PRN

## 2017-11-07 NOTE — Therapy (Signed)
[place], [place], [address] Phone: [phone]   Fax:  [phone]  Physical Therapy Evaluation  Patient Details  Name: [name] MRN: [id number] Date of Birth: 14-Jan-1971 Referring Provider: Arther Abbott, MD   Encounter Date: 11/07/2017  PT End of Session - 11/07/17 1332    Visit Number  1    Number of Visits  19    Date for PT Re-Evaluation  12/19/17 mini reassessment on 11/28/17    Authorization Type  [employer] (60 DAY limit for coverage, NOT 60 VISITS -- 11/07/17 to 12/30/17 is her 93 DAY LIMIT, hard max, visits cannot be scheduled after 12/30/17)    Authorization Time Period  11/07/17 to 12/19/17     Authorization - Visit Number  1 # of days since eval, not visit count    Authorization - Number of Visits  60 60 days from initial eval -- 11/07/17 to 12/30/17 is her 60 day limit    PT Start Time  1300    PT Stop Time  1335    PT Time Calculation (min)  35 min    Activity Tolerance  Patient tolerated treatment well    Behavior During Therapy  [facility] for tasks assessed/performed       Past Medical History:  Diagnosis Date  . Arthritis   . Carpal tunnel syndrome, bilateral   . Diabetes mellitus without complication ([place])    no meds; diet controlled  . Diverticulitis   . Hypertension   . PONV (postoperative nausea and vomiting)   . Sciatica     Past Surgical History:  Procedure Laterality Date  . ablasion of uterus    . CARPAL TUNNEL RELEASE  11/10/2010   Procedure: CARPAL TUNNEL RELEASE;  Surgeon: Arther Abbott, MD;  Location: [facility];  Service: Orthopedics;  Laterality: Right;  . CHOLECYSTECTOMY     [facility]  . COLON RESECTION N/A 04/08/2015   Procedure: LAPAROSCOPIC HAND ASSISTED PARTIAL COLECTOMY  CONVERTED TO OPEN AT 2778;  Surgeon: Aviva Signs, MD;  Location: [facility];  Service: General;  Laterality: N/A;  . COLONOSCOPY N/A 03/28/2014   Procedure: COLONOSCOPY;  Surgeon: Rogene Houston, MD;  Location:  [facility];  Service: Endoscopy;  Laterality: N/A;  730  . KNEE ARTHROSCOPY WITH DRILLING/MICROFRACTURE Left 10/20/2016   Procedure: LEFT KNEE ARTHROSCOPY WITH MICROFRACTURE;  Surgeon: Carole Civil, MD;  Location: [facility];  Service: Orthopedics;  Laterality: Left;  . KNEE ARTHROSCOPY WITH DRILLING/MICROFRACTURE Left 04/27/2017   Procedure: KNEE ARTHROSCOPY WITH DRILLING/MICROFRACTURE;  Surgeon: Carole Civil, MD;  Location: [facility];  Service: Orthopedics;  Laterality: Left;  . KNEE ARTHROSCOPY WITH OSTEOCHONDRAL DEFECT REPAIR Left 04/27/2017   Procedure: KNEE ARTHROSCOPY WITH OSTEOCHONDRAL DEFECT REPAIR Autograft;  Surgeon: Carole Civil, MD;  Location: [facility];  Service: Orthopedics;  Laterality: Left;  . KNEE SURGERY    . PARTIAL COLECTOMY N/A 04/08/2015   Procedure: PARTIAL COLECTOMY;  Surgeon: Aviva Signs, MD;  Location: [facility];  Service: General;  Laterality: N/A;  . right knee arthroscopy    . TOTAL KNEE ARTHROPLASTY Left 10/17/2017   Procedure: TOTAL KNEE ARTHROPLASTY;  Surgeon: Carole Civil, MD;  Location: [facility];  Service: Orthopedics;  Laterality: Left;  DePuy   . TUBAL LIGATION      There were no vitals filed for this visit.   Subjective Assessment - 11/07/17 1303    Subjective  Pt reports undergoing L TKA on 10/17/17 by Dr. Dorothyann Peng  [facility]. Pt reports that bone on bone and increased knee pain is what led to her surgery. She had HHPT after her hospital discharge and was d/c on 11/03/17. She reports having the most difficulty with extension; flexion is alright, it's better than extension. She was not using an AD piror to her surgery. She reports that the knee pain is better but it is still pretty bad at times.     Limitations  Walking;House hold activities    How long can you sit comfortably?  few mins    How long can you stand comfortably?  5 mins    How long can you walk comfortably?  maybe 5 mins    Patient Stated Goals  be able to function    Currently in  Pain?  Yes    Pain Score  7     Pain Location  Knee    Pain Orientation  Left    Pain Descriptors / Indicators  Aching;Dull    Pain Type  Surgical pain    Pain Onset  1 to 4 weeks ago    Pain Frequency  Constant    Aggravating Factors   nothing specific    Pain Relieving Factors  icing, massage, moving it    Effect of Pain on Daily Activities  affects everything         [facility] PT Assessment - 11/07/17 0001      Assessment   Medical Diagnosis  L TKA    Referring Provider  Arther Abbott, MD    Onset Date/Surgical Date  10/17/17    Next MD Visit  11/14/17    Prior Therapy  HHPT, d/c on 11/03/17      Balance Screen   Has the patient fallen in the past 6 months  No    Has the patient had a decrease in activity level because of a fear of falling?   No    Is the patient reluctant to leave their home because of a fear of falling?   No      Prior Function   Level of Independence  Independent    Leisure  watch TV, shopping      Observation/Other Assessments   Focus on Therapeutic Outcomes (FOTO)   68% limitation      Observation/Other Assessments-Edema    Edema  Circumferential      Circumferential Edema   Circumferential - Right  33.5cm joint line    Circumferential - Left   37.5cm joint line      ROM / Strength   AROM / PROM / Strength  AROM;Strength      AROM   AROM Assessment Site  Knee    Right/Left Knee  Left    Left Knee Extension  12    Left Knee Flexion  110      Strength   Strength Assessment Site  Hip;Knee;Ankle    Right Hip Flexion  5/5    Right Hip Extension  4/5    Right Hip ABduction  4/5    Left Hip Flexion  4+/5    Left Hip Extension  4-/5    Left Hip ABduction  4/5    Right Knee Flexion  5/5    Right Knee Extension  5/5    Left Knee Flexion  4+/5 through available range    Left Knee Extension  3+/5 through available range    Right Ankle Dorsiflexion  5/5    Left Ankle Dorsiflexion  4+/5  Palpation   Patella mobility  hypomobile sup/inf,  slightly hypomobile med/lat    Palpation comment  palpable knots in distal L quad, mild scar hypomobility      Ambulation/Gait   Ambulation Distance (Feet)  296 Feet 3MWT    Assistive device  Straight cane    Gait Pattern  Step-through pattern;Decreased stance time - left;Decreased hip/knee flexion - left;Decreased dorsiflexion - left;Left foot flat;Trendelenburg;Antalgic      Balance   Balance Assessed  Yes      Static Standing Balance   Static Standing - Balance Support  No upper extremity supported    Static Standing Balance -  Activities   Single Leg Stance - Right Leg;Single Leg Stance - Left Leg    Static Standing - Comment/# of Minutes  R: 1:00 L:13.3 sec      Standardized Balance Assessment   Standardized Balance Assessment  Five Times Sit to Stand    Five times sit to stand comments   13.4sec, no UE, LLE extended, weight shifted off LLE          Objective measurements completed on examination: See above findings.        PT Education - 11/07/17 1331    Education Details  exam findings, POC, HEP    Person(s) Educated  Patient    Methods  Explanation;Demonstration;Handout    Comprehension  Verbalized understanding;Returned demonstration       PT Short Term Goals - 11/07/17 1448      PT SHORT TERM GOAL #1   Title  Pt will be independent with HEP and perform consistently in order to decrease pain and maximize ROM.    Time  3    Period  Weeks    Status  New    Target Date  11/28/17      PT SHORT TERM GOAL #2   Title  Pt will have improved L knee AROM from 5-120 deg to maximize gait.    Time  3    Period  Weeks    Status  New      PT SHORT TERM GOAL #3   Title  Pt will have decreased L knee joint line edema by 2cm or > in order to maximize ROM and decrease pain.    Time  3    Period  Weeks    Status  New      PT SHORT TERM GOAL #4   Title  Pt will have 1/2 grade improvement in MMT throughout in order to maximize gait and functional mobility.    Time  3     Period  Weeks    Status  New        PT Long Term Goals - 11/07/17 1449      PT LONG TERM GOAL #1   Title  Pt will have improved L knee AROM from 0-125deg in order to further maximize gait as well as sitting tolerance and stair ambulation.    Time  6    Period  Weeks    Status  New    Target Date  12/19/17      PT LONG TERM GOAL #2   Title  Pt will be able to perform 5xSTS in 10 sec or < with no UE and mechanics WFL to demo improved balance and functional strength.    Time  6    Period  Weeks    Status  New      PT LONG TERM GOAL #3  Title  Pt will be able to perform L SLS for 30 sec or > to demo improved balance and functional strength in order to maximize gait and stair ambulation.    Time  6    Period  Weeks    Status  New      PT LONG TERM GOAL #4   Title  Pt will be able to ambulate 667ft during 3MWT with LRAD and gait WFL in order to maximize return to PLOF.    Time  6    Period  Weeks    Status  New      PT LONG TERM GOAL #5   Title  Pt will have 1 grade improvement throughout BLE MMT in order to further maximize her gait and balance and overall retrun to PLOF.    Time  6    Period  Weeks    Status  New             Plan - 11/07/17 1339    Clinical Impression Statement  Pt is pleasant 47YO F who presents to OPPT s/p L TKA by Dr. Arther Abbott on 10/17/17. Pt presents with post-op deficits in pain, edema, ROM, MMT, balance, gait, functional strength, and functional mobility. Pt's AROM 12-110deg this date and she had 4cm of swelling at her L knee joint line compared to the R. Pt needs skilled PT intervention to address these deficits in order to decrease pain.    Clinical Presentation  Stable    Clinical Presentation due to:  pain, edema, ROM, MMT, SLS 5XSTS, 3MWT, functional strength, patellar mobility, soft tissue restrictions    Clinical Decision Making  Low    Rehab Potential  Good    PT Frequency  3x / week    PT Duration  6 weeks    PT  Treatment/Interventions  ADLs/Self Care Home Management;Aquatic Therapy;Biofeedback;Cryotherapy;Electrical Stimulation;Ultrasound;DME Instruction;Gait training;Stair training;Functional mobility training;Therapeutic activities;Therapeutic exercise;Balance training;Neuromuscular re-education;Patient/family education;Manual techniques;Scar mobilization;Passive range of motion;Dry needling;Taping    PT Next Visit Plan  review goals and HEP; focus on decreasing edema and improving ROM initially, progress to strengthening as ROM normalizes    PT Home Exercise Plan  eval: quad sets, heel slides, supine hamstring stretch    Consulted and Agree with Plan of Care  Patient       Patient will benefit from skilled therapeutic intervention in order to improve the following deficits and impairments:  Decreased activity tolerance, Decreased balance, Decreased endurance, Decreased range of motion, Decreased strength, Decreased scar mobility, Difficulty walking, Hypomobility, Increased edema, Increased muscle spasms, Increased fascial restricitons, Impaired flexibility, Improper body mechanics, Pain  Visit Diagnosis: Acute pain of left knee - Plan: PT plan of care cert/re-cert  Stiffness of left knee, not elsewhere classified - Plan: PT plan of care cert/re-cert  Muscle weakness (generalized) - Plan: PT plan of care cert/re-cert  Difficulty in walking, not elsewhere classified - Plan: PT plan of care cert/re-cert     Problem List Patient Active Problem List   Diagnosis Date Noted  . 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 Chondral defect of condyle of right femur   . S/P left knee arthroscopy 04/27/17 03/28/2017  . 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 Abdominal pain 02/17/2015  . Nausea without vomiting 02/17/2015  . Diverticulitis of intestine with abscess 01/08/2015  . Acute diverticulitis 01/08/2015  . Nausea 12/31/2014  .  Diverticulitis 12/31/2014  .  Hyperglycemia 12/31/2014  . Medication reaction 12/31/2014  . Diverticulitis of large intestine without perforation or abscess without bleeding   . Thrush, oral   . Fatty liver 03/11/2014  . Family hx of colon cancer requiring screening colonoscopy 03/11/2014  . Rt flank pain 03/11/2014  . Ulnar nerve compression 06/14/2011  . 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 LOW BACK PAIN 11/07/2007  . SCIATICA 11/07/2007  . LUMBAR SPASM 11/07/2007        Geraldine Solar PT, DPT  [place] [address] [place], [place], [address] Phone: [phone]   Fax:  [phone]  Name: ARDA DAGGS MRN: [id number] Date of Birth: 1970-05-02

## 2017-11-07 NOTE — Patient Instructions (Signed)
Access Code: [email or web address]  URL: [email or web address]  Date: 11/07/2017  Prepared by: Geraldine Solar   Exercises Supine Quadricep Sets - 10 reps - 3 sets - 5-10 hold - 1x daily - 7x weekly Supine Heel Slide with Strap - 10 reps - 3 sets - 5 hold - 1x daily - 7x weekly Supine Hamstring Stretch with Strap - 3-5 reps - 30-60 hold - 1x daily - 7x weekly

## 2017-11-07 NOTE — Telephone Encounter (Signed)
Patient called for refill:  oxyCODONE-acetaminophen (PERCOCET) 10-325 MG tablet 42 tablet 0  -[place]

## 2017-11-09 ENCOUNTER — Ambulatory Visit (HOSPITAL_COMMUNITY): Payer: Managed Care, Other (non HMO) | Admitting: Physical Therapy

## 2017-11-09 ENCOUNTER — Encounter (HOSPITAL_COMMUNITY): Payer: Self-pay | Admitting: Physical Therapy

## 2017-11-09 DIAGNOSIS — M6281 Muscle weakness (generalized): Secondary | ICD-10-CM

## 2017-11-09 DIAGNOSIS — M25662 Stiffness of left knee, not elsewhere classified: Secondary | ICD-10-CM

## 2017-11-09 DIAGNOSIS — R262 Difficulty in walking, not elsewhere classified: Secondary | ICD-10-CM

## 2017-11-09 DIAGNOSIS — M25562 Pain in left knee: Secondary | ICD-10-CM

## 2017-11-09 NOTE — Therapy (Signed)
[place], [place], [address] Phone: [phone]   Fax:  [phone]  Physical Therapy Treatment  Patient Details  Name: [name] MRN: [id number] Date of Birth: [birth year] Referring Provider: Arther Abbott, MD   Encounter Date: 11/09/2017  PT End of Session - 11/09/17 1442    Visit Number  2    Number of Visits  19    Date for PT Re-Evaluation  12/19/17 mini reassessment on 11/28/17    Authorization Type  [employer] Managed (60 DAY limit for coverage, NOT 60 VISITS -- 11/07/17 to 12/30/17 is her 82 DAY LIMIT, hard max, visits cannot be scheduled after 12/30/17)    Authorization Time Period  11/07/17 to 12/19/17     Authorization - Visit Number  2 # of days since eval, not visit count    Authorization - Number of Visits  60 60 days from initial eval -- 11/07/17 to 12/30/17 is her 60 day limit    PT Start Time  1430    PT Stop Time  1510    PT Time Calculation (min)  40 min    Activity Tolerance  Patient tolerated treatment well    Behavior During Therapy  [facility] for tasks assessed/performed       Past Medical History:  Diagnosis Date  . Arthritis   . Carpal tunnel syndrome, bilateral   . Diabetes mellitus without complication ([place])    no meds; diet controlled  . Diverticulitis   . Hypertension   . PONV (postoperative nausea and vomiting)   . Sciatica     Past Surgical History:  Procedure Laterality Date  . ablasion of uterus    . CARPAL TUNNEL RELEASE  11/10/2010   Procedure: CARPAL TUNNEL RELEASE;  Surgeon: Arther Abbott, MD;  Location: [facility];  Service: Orthopedics;  Laterality: Right;  . CHOLECYSTECTOMY     [facility]  . COLON RESECTION N/A 04/08/2015   Procedure: LAPAROSCOPIC HAND ASSISTED PARTIAL COLECTOMY  CONVERTED TO OPEN AT 0814;  Surgeon: Aviva Signs, MD;  Location: [facility];  Service: General;  Laterality: N/A;  . COLONOSCOPY N/A 03/28/2014   Procedure: COLONOSCOPY;  Surgeon: Rogene Houston, MD;  [facility]  [facility];  Service: Endoscopy;  Laterality: N/A;  730  . KNEE ARTHROSCOPY WITH DRILLING/MICROFRACTURE Left 10/20/2016   Procedure: LEFT KNEE ARTHROSCOPY WITH MICROFRACTURE;  Surgeon: Carole Civil, MD;  Location: [facility];  Service: Orthopedics;  Laterality: Left;  . KNEE ARTHROSCOPY WITH DRILLING/MICROFRACTURE Left 04/27/2017   Procedure: KNEE ARTHROSCOPY WITH DRILLING/MICROFRACTURE;  Surgeon: Carole Civil, MD;  Location: [facility];  Service: Orthopedics;  Laterality: Left;  . KNEE ARTHROSCOPY WITH OSTEOCHONDRAL DEFECT REPAIR Left 04/27/2017   Procedure: KNEE ARTHROSCOPY WITH OSTEOCHONDRAL DEFECT REPAIR Autograft;  Surgeon: Carole Civil, MD;  Location: [facility];  Service: Orthopedics;  Laterality: Left;  . KNEE SURGERY    . PARTIAL COLECTOMY N/A 04/08/2015   Procedure: PARTIAL COLECTOMY;  Surgeon: Aviva Signs, MD;  Location: [facility];  Service: General;  Laterality: N/A;  . right knee arthroscopy    . TOTAL KNEE ARTHROPLASTY Left 10/17/2017   Procedure: TOTAL KNEE ARTHROPLASTY;  Surgeon: Carole Civil, MD;  Location: [facility];  Service: Orthopedics;  Laterality: Left;  DePuy   . TUBAL LIGATION      There were no vitals filed for this visit.  Subjective Assessment - 11/09/17 1431    Subjective Pt states that she is having a difficult time controlling her pain.  She is icing four times a day.    Limitations  Walking;House hold activities    How long can you sit comfortably?  few mins    How long can you stand comfortably?  5 mins    How long can you walk comfortably?  maybe 5 mins    Patient Stated Goals  be able to function    Currently in Pain?  Yes  Level 7    Pain Location  knee    Pain Orientation  Left    Pain Descriptors / Indicators  Aching    Pain Type  Acute pain    Pain Onset  1 to 4 weeks ago    Pain Frequency  Constant    Aggravating Factors   activity     Pain Relieving Factors  ice     Effect of Pain on Daily Activities  limits                        [facility] Adult PT Treatment/Exercise - 11/09/17 0001      Ambulation/Gait                 Exercises   Exercises  Knee/Hip      Knee/Hip Exercises: Stretches   Active Hamstring Stretch  Left;3 reps;30 seconds      Knee/Hip Exercises: Standing   Heel Raises  Both;10 reps    Knee Flexion  Left;10 reps    Terminal Knee Extension  Left;10 reps    SLS  Lt 3 x max 40 seconds       Knee/Hip Exercises: Seated   Long Arc Quad  Left;10 reps      Knee/Hip Exercises: Supine   Quad Sets  10 reps    Heel Slides  10 reps      Manual Therapy   Manual Therapy  Edema management    Manual therapy comments  done seperate from all other aspects of treatment    Edema Management  retromassage and decongestive techniques to decrease edema                PT Short Term Goals - 11/09/17 1453      PT SHORT TERM GOAL #1   Title  Pt will be independent with HEP and perform consistently in order to decrease pain and maximize ROM.    Time  3    Period  Weeks    Status  On-going      PT SHORT TERM GOAL #2   Title  Pt will have improved L knee AROM from 5-120 deg to maximize gait.    Time  3    Period  Weeks    Status  On-going      PT SHORT TERM GOAL #3   Title  Pt will have decreased L knee joint line edema by 2cm or > in order to maximize ROM and decrease pain.    Time  3    Period  Weeks    Status  On-going      PT SHORT TERM GOAL #4   Title  Pt will have 1/2 grade improvement in MMT throughout in order to maximize gait and functional mobility.    Time  3    Period  Weeks    Status  On-going        PT Long Term Goals - 11/09/17 1454      PT LONG TERM GOAL #1   Title  Pt will  have improved L knee AROM from 0-125deg in order to further maximize gait as well as sitting tolerance and stair ambulation.    Time  6    Period  Weeks    Status  On-going      PT LONG TERM GOAL #2   Title  Pt will be able to perform 5xSTS in 10 sec or < with no  UE and mechanics WFL to demo improved balance and functional strength.    Time  6    Period  Weeks    Status  On-going      PT LONG TERM GOAL #3   Title  Pt will be able to perform L SLS for 30 sec or > to demo improved balance and functional strength in order to maximize gait and stair ambulation.    Time  6    Period  Weeks    Status  Achieved      PT LONG TERM GOAL #4   Title  Pt will be able to ambulate 630ft during 3MWT with LRAD and gait WFL in order to maximize return to PLOF.    Time  6    Period  Weeks    Status  On-going      PT LONG TERM GOAL #5   Title  Pt will have 1 grade improvement throughout BLE MMT in order to further maximize her gait and balance and overall retrun to PLOF.    Time  6    Period  Weeks    Status  On-going            Plan - 11/09/17 1442    Clinical Impression Statement  Reviewed evaluation and goals with patient without questions.  Pt has been doing her HEP.  Pt has improved ROM at this date with current ROM at 8-115    Rehab Potential  Good    PT Frequency  3x / week    PT Duration  6 weeks    PT Treatment/Interventions  ADLs/Self Care Home Management;Aquatic Therapy;Biofeedback;Cryotherapy;Electrical Stimulation;Ultrasound;DME Instruction;Gait training;Stair training;Functional mobility training;Therapeutic activities;Therapeutic exercise;Balance training;Neuromuscular re-education;Patient/family education;Manual techniques;Scar mobilization;Passive range of motion;Dry needling;Taping    PT Next Visit Plan  continue to focus on decreasing edema and improving ROM initially,begin progress to strengthening     PT Home Exercise Plan  eval: quad sets, heel slides, supine hamstring stretch    Consulted and Agree with Plan of Care  Patient       Patient will benefit from skilled therapeutic intervention in order to improve the following deficits and impairments:  Decreased activity tolerance, Decreased balance, Decreased endurance, Decreased  range of motion, Decreased strength, Decreased scar mobility, Difficulty walking, Hypomobility, Increased edema, Increased muscle spasms, Increased fascial restricitons, Impaired flexibility, Improper body mechanics, Pain  Visit Diagnosis: Acute pain of left knee  Stiffness of left knee, not elsewhere classified  Muscle weakness (generalized)  Difficulty in walking, not elsewhere classified     Problem List Patient Active Problem List   Diagnosis Date Noted  . 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 Chondral defect of condyle of right femur   . S/P left knee arthroscopy 04/27/17 03/28/2017  . 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 Abdominal pain 02/17/2015  . Nausea without vomiting 02/17/2015  . Diverticulitis of intestine with abscess 01/08/2015  . Acute diverticulitis 01/08/2015  . Nausea 12/31/2014  . Diverticulitis 12/31/2014  . Hyperglycemia 12/31/2014  .  Medication reaction 12/31/2014  . Diverticulitis of large intestine without perforation or abscess without bleeding   . Thrush, oral   . Fatty liver 03/11/2014  . Family hx of colon cancer requiring screening colonoscopy 03/11/2014  . Rt flank pain 03/11/2014  . Ulnar nerve compression 06/14/2011  . 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 LOW BACK PAIN 11/07/2007  . SCIATICA 11/07/2007  . LUMBAR SPASM 11/07/2007    Rayetta Humphrey, PT CLT [phone] 11/09/2017, 3:11 PM  [place] [address] [place], [place], [address] Phone: [phone]   Fax:  [phone]  Name: [name] MRN: [id number] Date of Birth: 1970-09-26

## 2017-11-13 ENCOUNTER — Ambulatory Visit (HOSPITAL_COMMUNITY): Payer: Managed Care, Other (non HMO) | Admitting: Physical Therapy

## 2017-11-13 DIAGNOSIS — R262 Difficulty in walking, not elsewhere classified: Secondary | ICD-10-CM

## 2017-11-13 DIAGNOSIS — M6281 Muscle weakness (generalized): Secondary | ICD-10-CM

## 2017-11-13 DIAGNOSIS — M25662 Stiffness of left knee, not elsewhere classified: Secondary | ICD-10-CM

## 2017-11-13 DIAGNOSIS — M25562 Pain in left knee: Secondary | ICD-10-CM

## 2017-11-13 NOTE — Therapy (Addendum)
[place], [place], [address] Phone: [phone]   Fax:  [phone]  Physical Therapy Treatment  Patient Details  Name: [name] MRN: [id number] Date of Birth: Dec 14, 1970 Referring Provider: Arther Abbott, MD   Encounter Date: 11/13/2017    # OF FEET WALKED: 450 with SPC ROM:  Flexion: 116            Extension: 5    PT End of Session - 11/13/17 1616    Visit Number  3    Number of Visits  19    Date for PT Re-Evaluation  12/19/17 mini reassessment on 11/28/17    Authorization Type  [employer] (60 DAY limit for coverage, NOT 60 VISITS -- 11/07/17 to 12/30/17 is her 71 DAY LIMIT, hard max, visits cannot be scheduled after 12/30/17)    Authorization Time Period  11/07/17 to 12/19/17     Authorization - Visit Number  3 # of days since eval, not visit count    Authorization - Number of Visits  60 60 days from initial eval -- 11/07/17 to 12/30/17 is her 60 day limit    PT Start Time  1520    PT Stop Time  1600    PT Time Calculation (min)  40 min    Activity Tolerance  Patient tolerated treatment well    Behavior During Therapy  [facility] for tasks assessed/performed       Past Medical History:  Diagnosis Date  . Arthritis   . Carpal tunnel syndrome, bilateral   . Diabetes mellitus without complication ([place])    no meds; diet controlled  . Diverticulitis   . Hypertension   . PONV (postoperative nausea and vomiting)   . Sciatica     Past Surgical History:  Procedure Laterality Date  . ablasion of uterus    . CARPAL TUNNEL RELEASE  11/10/2010   Procedure: CARPAL TUNNEL RELEASE;  Surgeon: Arther Abbott, MD;  Location: [facility];  Service: Orthopedics;  Laterality: Right;  . CHOLECYSTECTOMY     [facility]  . COLON RESECTION N/A 04/08/2015   Procedure: LAPAROSCOPIC HAND ASSISTED PARTIAL COLECTOMY  CONVERTED TO OPEN AT 4854;  Surgeon: Aviva Signs, MD;  Location: [facility];  Service: General;  Laterality: N/A;  .  COLONOSCOPY N/A 03/28/2014   Procedure: COLONOSCOPY;  Surgeon: Rogene Houston, MD;  Location: [facility];  Service: Endoscopy;  Laterality: N/A;  730  . KNEE ARTHROSCOPY WITH DRILLING/MICROFRACTURE Left 10/20/2016   Procedure: LEFT KNEE ARTHROSCOPY WITH MICROFRACTURE;  Surgeon: Carole Civil, MD;  Location: [facility];  Service: Orthopedics;  Laterality: Left;  . KNEE ARTHROSCOPY WITH DRILLING/MICROFRACTURE Left 04/27/2017   Procedure: KNEE ARTHROSCOPY WITH DRILLING/MICROFRACTURE;  Surgeon: Carole Civil, MD;  Location: [facility];  Service: Orthopedics;  Laterality: Left;  . KNEE ARTHROSCOPY WITH OSTEOCHONDRAL DEFECT REPAIR Left 04/27/2017   Procedure: KNEE ARTHROSCOPY WITH OSTEOCHONDRAL DEFECT REPAIR Autograft;  Surgeon: Carole Civil, MD;  Location: [facility];  Service: Orthopedics;  Laterality: Left;  . KNEE SURGERY    . PARTIAL COLECTOMY N/A 04/08/2015   Procedure: PARTIAL COLECTOMY;  Surgeon: Aviva Signs, MD;  Location: [facility];  Service: General;  Laterality: N/A;  . right knee arthroscopy    . TOTAL KNEE ARTHROPLASTY Left 10/17/2017   Procedure: TOTAL KNEE ARTHROPLASTY;  Surgeon: Carole Civil, MD;  Location: [facility];  Service: Orthopedics;  Laterality: Left;  DePuy   . TUBAL LIGATION      There  were no vitals filed for this visit.  Subjective Assessment - 11/13/17 1641    Subjective  Pt states she is hurting today in her knee.  STates she returns to MD this week.     Currently in Pain?  Yes    Pain Score  5     Pain Location  Knee    Pain Orientation  Left    Pain Descriptors / Indicators  Aching    Pain Type  Acute pain                       [facility] Adult PT Treatment/Exercise - 11/13/17 0001      Ambulation/Gait   Ambulation Distance (Feet)  450 Feet    Assistive device  Straight cane    Gait Pattern  Step-through pattern;Decreased stance time - left;Decreased hip/knee flexion - left;Decreased dorsiflexion - left;Left foot  flat;Trendelenburg;Antalgic      Knee/Hip Exercises: Stretches   Active Hamstring Stretch  Left;3 reps;30 seconds;Limitations    Active Hamstring Stretch Limitations  12" step standing    Knee: Self-Stretch to increase Flexion  Left;10 seconds;Limitations    Knee: Self-Stretch Limitations  10 reps 12" box    Gastroc Stretch  Both;3 reps;30 seconds;Limitations    Gastroc Stretch Limitations  slant board      Knee/Hip Exercises: Standing   Heel Raises  Both;10 reps    Knee Flexion  Left;10 reps      Knee/Hip Exercises: Seated   Long Arc Quad  Left;10 reps      Knee/Hip Exercises: Supine   Quad Sets  Left;15 reps    Heel Slides  Left;10 reps    Knee Extension  AROM;Limitations    Knee Extension Limitations  5    Knee Flexion  AROM;Limitations    Knee Flexion Limitations  116      Manual Therapy   Manual Therapy  Myofascial release;Soft tissue mobilization    Manual therapy comments  done seperate from all other aspects of treatment    Soft tissue mobilization  to decrease spasm in Lt quadricep/ITB    Myofascial Release  scar tissue and adhesions perimeter of knee and scar line               PT Short Term Goals - 11/09/17 1453      PT SHORT TERM GOAL #1   Title  Pt will be independent with HEP and perform consistently in order to decrease pain and maximize ROM.    Time  3    Period  Weeks    Status  On-going      PT SHORT TERM GOAL #2   Title  Pt will have improved L knee AROM from 5-120 deg to maximize gait.    Time  3    Period  Weeks    Status  On-going      PT SHORT TERM GOAL #3   Title  Pt will have decreased L knee joint line edema by 2cm or > in order to maximize ROM and decrease pain.    Time  3    Period  Weeks    Status  On-going      PT SHORT TERM GOAL #4   Title  Pt will have 1/2 grade improvement in MMT throughout in order to maximize gait and functional mobility.    Time  3    Period  Weeks    Status  On-going  PT Long Term Goals -  11/09/17 1454      PT LONG TERM GOAL #1   Title  Pt will have improved L knee AROM from 0-125deg in order to further maximize gait as well as sitting tolerance and stair ambulation.    Time  6    Period  Weeks    Status  On-going      PT LONG TERM GOAL #2   Title  Pt will be able to perform 5xSTS in 10 sec or < with no UE and mechanics WFL to demo improved balance and functional strength.    Time  6    Period  Weeks    Status  On-going      PT LONG TERM GOAL #3   Title  Pt will be able to perform L SLS for 30 sec or > to demo improved balance and functional strength in order to maximize gait and stair ambulation.    Time  6    Period  Weeks    Status  Achieved      PT LONG TERM GOAL #4   Title  Pt will be able to ambulate 647ft during 3MWT with LRAD and gait WFL in order to maximize return to PLOF.    Time  6    Period  Weeks    Status  On-going      PT LONG TERM GOAL #5   Title  Pt will have 1 grade improvement throughout BLE MMT in order to further maximize her gait and balance and overall retrun to PLOF.    Time  6    Period  Weeks    Status  On-going            Plan - 11/13/17 1616    Clinical Impression Statement  continued with primary focus on ROM today.  Noted with multiple spasms in Lt quad and into ITB region.  Able to decrease overall tightness, however unable to resolve all spasms.  scar tissue perimeter of knee as well as adhesions with successful release along scar line. Able to achieve improved ROM following of 5-116 degrees.  Encouraged pateint to complete HEP as directed and complete self manual to reduce her restrictions and pain.    Rehab Potential  Good    PT Frequency  3x / week    PT Duration  6 weeks    PT Treatment/Interventions  ADLs/Self Care Home Management;Aquatic Therapy;Biofeedback;Cryotherapy;Electrical Stimulation;Ultrasound;DME Instruction;Gait training;Stair training;Functional mobility training;Therapeutic activities;Therapeutic  exercise;Balance training;Neuromuscular re-education;Patient/family education;Manual techniques;Scar mobilization;Passive range of motion;Dry needling;Taping    PT Next Visit Plan  continue to focus on decreasing edema and improving ROM initially,begin progress to strengthening     PT Home Exercise Plan  eval: quad sets, heel slides, supine hamstring stretch    Consulted and Agree with Plan of Care  Patient       Patient will benefit from skilled therapeutic intervention in order to improve the following deficits and impairments:  Decreased activity tolerance, Decreased balance, Decreased endurance, Decreased range of motion, Decreased strength, Decreased scar mobility, Difficulty walking, Hypomobility, Increased edema, Increased muscle spasms, Increased fascial restricitons, Impaired flexibility, Improper body mechanics, Pain  Visit Diagnosis: Acute pain of left knee  Stiffness of left knee, not elsewhere classified  Muscle weakness (generalized)  Difficulty in walking, not elsewhere classified     Problem List Patient Active Problem List   Diagnosis Date Noted  . 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 Chondral defect of  condyle of right femur   . S/P left knee arthroscopy 04/27/17 03/28/2017  . 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 Abdominal pain 02/17/2015  . Nausea without vomiting 02/17/2015  . Diverticulitis of intestine with abscess 01/08/2015  . Acute diverticulitis 01/08/2015  . Nausea 12/31/2014  . Diverticulitis 12/31/2014  . Hyperglycemia 12/31/2014  . Medication reaction 12/31/2014  . Diverticulitis of large intestine without perforation or abscess without bleeding   . Thrush, oral   . Fatty liver 03/11/2014  . Family hx of colon cancer requiring screening colonoscopy 03/11/2014  . Rt flank pain 03/11/2014  . Ulnar nerve compression 06/14/2011  . 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 LOW  BACK PAIN 11/07/2007  . SCIATICA 11/07/2007  . LUMBAR SPASM 11/07/2007   Teena Irani, PTA/CLT [phone]  Teena Irani 11/13/2017, 4:42 PM  [place] [address] [place], [place], [address] Phone: [phone]   Fax:  [phone]  Name: [name] MRN: [id number] Date of Birth: May 11, 1970

## 2017-11-14 ENCOUNTER — Encounter: Payer: Self-pay | Admitting: Orthopedic Surgery

## 2017-11-14 ENCOUNTER — Ambulatory Visit (INDEPENDENT_AMBULATORY_CARE_PROVIDER_SITE_OTHER): Payer: Managed Care, Other (non HMO) | Admitting: Orthopedic Surgery

## 2017-11-14 VITALS — BP 149/96 | HR 91 | Ht 65.0 in | Wt 190.0 lb

## 2017-11-14 DIAGNOSIS — M238X2 Other internal derangements of left knee: Secondary | ICD-10-CM

## 2017-11-14 DIAGNOSIS — Z96652 Presence of left artificial knee joint: Secondary | ICD-10-CM

## 2017-11-14 DIAGNOSIS — M1712 Unilateral primary osteoarthritis, left knee: Secondary | ICD-10-CM

## 2017-11-14 MED ORDER — PROMETHAZINE HCL 12.5 MG PO TABS: 12.5000 mg | ORAL_TABLET | Freq: Four times a day (QID) | ORAL | 0 refills | Status: DC | PRN

## 2017-11-14 MED ORDER — METHOCARBAMOL 500 MG PO TABS: 500.0000 mg | ORAL_TABLET | Freq: Four times a day (QID) | ORAL | 5 refills | Status: DC | PRN

## 2017-11-14 MED ORDER — OXYCODONE-ACETAMINOPHEN 10-325 MG PO TABS: 1.0000 | ORAL_TABLET | ORAL | 0 refills | Status: DC | PRN

## 2017-11-14 NOTE — Progress Notes (Signed)
Chief Complaint  Patient presents with  . Post-op Follow-up    10/17/17 Total knee replacement left     Postop day #28  Status post left total knee replacement   Current Outpatient Medications:  .  aspirin EC 325 MG EC tablet, Take 1 tablet (325 mg total) by mouth daily with breakfast., Disp: 30 tablet, Rfl: 0 .  methocarbamol (ROBAXIN) 500 MG tablet, Take 1 tablet (500 mg total) by mouth every 6 (six) hours as needed for muscle spasms., Disp: 28 tablet, Rfl: 5 .  oxyCODONE-acetaminophen (PERCOCET) 10-325 MG tablet, Take 1 tablet by mouth every 4 (four) hours as needed for pain., Disp: 42 tablet, Rfl: 0 .  promethazine (PHENERGAN) 12.5 MG tablet, Take 1 tablet (12.5 mg total) by mouth every 6 (six) hours as needed for nausea or vomiting., Disp: 30 tablet, Rfl: 0 .  senna-docusate (SENOKOT-S) 8.6-50 MG tablet, Take 1 tablet by mouth at bedtime as needed for mild constipation., Disp: 60 tablet, Rfl: 0  Wound: 1 eschar just at the superior aspect of the patella no surrounding erythema   ROM: 2-1 17  Refill medications follow-up in a month

## 2017-11-15 ENCOUNTER — Encounter (HOSPITAL_COMMUNITY): Payer: Self-pay

## 2017-11-15 ENCOUNTER — Ambulatory Visit (HOSPITAL_COMMUNITY): Payer: Managed Care, Other (non HMO)

## 2017-11-15 DIAGNOSIS — M6281 Muscle weakness (generalized): Secondary | ICD-10-CM

## 2017-11-15 DIAGNOSIS — M25562 Pain in left knee: Secondary | ICD-10-CM

## 2017-11-15 DIAGNOSIS — M25662 Stiffness of left knee, not elsewhere classified: Secondary | ICD-10-CM

## 2017-11-15 DIAGNOSIS — R262 Difficulty in walking, not elsewhere classified: Secondary | ICD-10-CM

## 2017-11-15 NOTE — Therapy (Signed)
[place], [place], [address] Phone: [phone]   Fax:  [phone]  Physical Therapy Treatment  Patient Details  Name: [name] MRN: [id number] Date of Birth: 08-11-1970 Referring Provider: Arther Abbott, MD   Encounter Date: 11/15/2017  PT End of Session - 11/15/17 1522    Visit Number  4    Number of Visits  19    Date for PT Re-Evaluation  12/19/17 mini reassessment on 11/28/17    Authorization Type  [employer] (60 DAY limit for coverage, NOT 60 VISITS -- 11/07/17 to 12/30/17 is her 42 DAY LIMIT, hard max, visits cannot be scheduled after 12/30/17)    Authorization Time Period  11/07/17 to 12/19/17     Authorization - Visit Number  4 # of days since eval, not visit count    Authorization - Number of Visits  60 60 days from initial eval -- 11/07/17 to 12/30/17 is her 60 day limit    PT Start Time  1520    PT Stop Time  1600    PT Time Calculation (min)  40 min    Activity Tolerance  Patient tolerated treatment well    Behavior During Therapy  [facility] for tasks assessed/performed       Past Medical History:  Diagnosis Date  . Arthritis   . Carpal tunnel syndrome, bilateral   . Diabetes mellitus without complication ([place])    no meds; diet controlled  . Diverticulitis   . Hypertension   . PONV (postoperative nausea and vomiting)   . Sciatica     Past Surgical History:  Procedure Laterality Date  . ablasion of uterus    . CARPAL TUNNEL RELEASE  11/10/2010   Procedure: CARPAL TUNNEL RELEASE;  Surgeon: Arther Abbott, MD;  Location: [facility];  Service: Orthopedics;  Laterality: Right;  . CHOLECYSTECTOMY     [facility]  . COLON RESECTION N/A 04/08/2015   Procedure: LAPAROSCOPIC HAND ASSISTED PARTIAL COLECTOMY  CONVERTED TO OPEN AT 3500;  Surgeon: Aviva Signs, MD;  Location: [facility];  Service: General;  Laterality: N/A;  . COLONOSCOPY N/A 03/28/2014   Procedure: COLONOSCOPY;  Surgeon: Rogene Houston, MD;  [facility];  Service: Endoscopy;  Laterality: N/A;  730  . KNEE ARTHROSCOPY WITH DRILLING/MICROFRACTURE Left 10/20/2016   Procedure: LEFT KNEE ARTHROSCOPY WITH MICROFRACTURE;  Surgeon: Carole Civil, MD;  Location: [facility];  Service: Orthopedics;  Laterality: Left;  . KNEE ARTHROSCOPY WITH DRILLING/MICROFRACTURE Left 04/27/2017   Procedure: KNEE ARTHROSCOPY WITH DRILLING/MICROFRACTURE;  Surgeon: Carole Civil, MD;  Location: [facility];  Service: Orthopedics;  Laterality: Left;  . KNEE ARTHROSCOPY WITH OSTEOCHONDRAL DEFECT REPAIR Left 04/27/2017   Procedure: KNEE ARTHROSCOPY WITH OSTEOCHONDRAL DEFECT REPAIR Autograft;  Surgeon: Carole Civil, MD;  Location: [facility];  Service: Orthopedics;  Laterality: Left;  . KNEE SURGERY    . PARTIAL COLECTOMY N/A 04/08/2015   Procedure: PARTIAL COLECTOMY;  Surgeon: Aviva Signs, MD;  Location: [facility];  Service: General;  Laterality: N/A;  . right knee arthroscopy    . TOTAL KNEE ARTHROPLASTY Left 10/17/2017   Procedure: TOTAL KNEE ARTHROPLASTY;  Surgeon: Carole Civil, MD;  Location: [facility];  Service: Orthopedics;  Laterality: Left;  DePuy   . TUBAL LIGATION      There were no vitals filed for this visit.  Subjective Assessment - 11/15/17 1522    Subjective  Pt reports that Dr. Aline Brochure was pleased with everything. He saw  her scab on her incision and told her to keep an eye on it.     Currently in Pain?  Yes    Pain Score  4     Pain Location  Knee    Pain Orientation  Left    Pain Descriptors / Indicators  Aching    Pain Type  Acute pain    Pain Onset  1 to 4 weeks ago    Pain Frequency  Constant    Aggravating Factors   activity    Pain Relieving Factors  ice    Effect of Pain on Daily Activities  limits         [facility] Adult PT Treatment/Exercise - 11/15/17 0001      Knee/Hip Exercises: Stretches   Active Hamstring Stretch  Left;3 reps;30 seconds;Limitations    Active Hamstring Stretch Limitations  12" step standing    Knee:  Self-Stretch to increase Flexion  Left;10 seconds;Limitations    Knee: Self-Stretch Limitations  10 reps 12" box    Gastroc Stretch  Both;3 reps;30 seconds;Limitations    Gastroc Stretch Limitations  slant board      Knee/Hip Exercises: Standing   Heel Raises  Both;20 reps    Heel Raises Limitations  heel and toe    Knee Flexion  Left;15 reps    Terminal Knee Extension  Left    Terminal Knee Extension Limitations  10x10" RTB    Rocker Board  2 minutes;Limitations    Rocker Board Limitations  R/L      Knee/Hip Exercises: Seated   Long Arc Quad  Left;15 reps      Knee/Hip Exercises: Supine   Short Arc Quad Sets  Left;15 reps;Limitations    Short Arc Quad Sets Limitations  basketball    Knee Extension Limitations  2    Knee Flexion Limitations  114      Manual Therapy   Manual Therapy  Edema management;Joint mobilization;Soft tissue mobilization;Myofascial release    Manual therapy comments  done seperate from all other aspects of treatment    Edema Management  retro massage with BLE elevated to decrease edema and pain    Joint Mobilization  L patellar mobs in all directions for improved mobility    Soft tissue mobilization  to decrease spasm in Lt quadricep    Myofascial Release  scar tissue and adhesions perimeter of knee and scar line            PT Education - 11/15/17 1524    Education Details  exercise technique, continue HEP    Person(s) Educated  Patient    Methods  Demonstration;Explanation    Comprehension  Verbalized understanding;Returned demonstration       PT Short Term Goals - 11/09/17 1453      PT SHORT TERM GOAL #1   Title  Pt will be independent with HEP and perform consistently in order to decrease pain and maximize ROM.    Time  3    Period  Weeks    Status  On-going      PT SHORT TERM GOAL #2   Title  Pt will have improved L knee AROM from 5-120 deg to maximize gait.    Time  3    Period  Weeks    Status  On-going      PT SHORT TERM GOAL #3    Title  Pt will have decreased L knee joint line edema by 2cm or > in order to maximize ROM and decrease pain.  Time  3    Period  Weeks    Status  On-going      PT SHORT TERM GOAL #4   Title  Pt will have 1/2 grade improvement in MMT throughout in order to maximize gait and functional mobility.    Time  3    Period  Weeks    Status  On-going        PT Long Term Goals - 11/09/17 1454      PT LONG TERM GOAL #1   Title  Pt will have improved L knee AROM from 0-125deg in order to further maximize gait as well as sitting tolerance and stair ambulation.    Time  6    Period  Weeks    Status  On-going      PT LONG TERM GOAL #2   Title  Pt will be able to perform 5xSTS in 10 sec or < with no UE and mechanics WFL to demo improved balance and functional strength.    Time  6    Period  Weeks    Status  On-going      PT LONG TERM GOAL #3   Title  Pt will be able to perform L SLS for 30 sec or > to demo improved balance and functional strength in order to maximize gait and stair ambulation.    Time  6    Period  Weeks    Status  Achieved      PT LONG TERM GOAL #4   Title  Pt will be able to ambulate 643ft during 3MWT with LRAD and gait WFL in order to maximize return to PLOF.    Time  6    Period  Weeks    Status  On-going      PT LONG TERM GOAL #5   Title  Pt will have 1 grade improvement throughout BLE MMT in order to further maximize her gait and balance and overall retrun to PLOF.    Time  6    Period  Weeks    Status  On-going            Plan - 11/15/17 1607    Clinical Impression Statement  Continued with established POC focusing on addressing L knee mobility and edema. Added rockerboard, SAQ, and patellar mobs all with good tolerance. Followed up therex with edema management and STM/MFR to address soft tissue restrictions of proximal scar and distal quad. AROM 2 to 114deg this date. Pt reporting increased pain following ROM measurements. Continue as planned,  progressing as able.    Rehab Potential  Good    PT Frequency  3x / week    PT Duration  6 weeks    PT Treatment/Interventions  ADLs/Self Care Home Management;Aquatic Therapy;Biofeedback;Cryotherapy;Electrical Stimulation;Ultrasound;DME Instruction;Gait training;Stair training;Functional mobility training;Therapeutic activities;Therapeutic exercise;Balance training;Neuromuscular re-education;Patient/family education;Manual techniques;Scar mobilization;Passive range of motion;Dry needling;Taping    PT Next Visit Plan  continue to focus on decreasing edema and improving ROM initially, begin progress to strengthening one AROM normalized    PT Home Exercise Plan  eval: quad sets, heel slides, supine hamstring stretch    Consulted and Agree with Plan of Care  Patient       Patient will benefit from skilled therapeutic intervention in order to improve the following deficits and impairments:  Decreased activity tolerance, Decreased balance, Decreased endurance, Decreased range of motion, Decreased strength, Decreased scar mobility, Difficulty walking, Hypomobility, Increased edema, Increased muscle spasms, Increased fascial restricitons, Impaired flexibility, Improper body  mechanics, Pain  Visit Diagnosis: Acute pain of left knee  Stiffness of left knee, not elsewhere classified  Muscle weakness (generalized)  Difficulty in walking, not elsewhere classified     Problem List Patient Active Problem List   Diagnosis Date Noted  . 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 Chondral defect of condyle of right femur   . S/P left knee arthroscopy 04/27/17 03/28/2017  . 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 Abdominal pain 02/17/2015  . Nausea without vomiting 02/17/2015  . Diverticulitis of intestine with abscess 01/08/2015  . Acute diverticulitis 01/08/2015  . Nausea 12/31/2014  . Diverticulitis 12/31/2014  . Hyperglycemia 12/31/2014   . Medication reaction 12/31/2014  . Diverticulitis of large intestine without perforation or abscess without bleeding   . Thrush, oral   . Fatty liver 03/11/2014  . Family hx of colon cancer requiring screening colonoscopy 03/11/2014  . Rt flank pain 03/11/2014  . Ulnar nerve compression 06/14/2011  . 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 LOW BACK PAIN 11/07/2007  . SCIATICA 11/07/2007  . LUMBAR SPASM 11/07/2007      Geraldine Solar PT, DPT  [place] [address] [place], [place], [address] Phone: [phone]   Fax:  [phone]  Name: ALLSION NOGALES MRN: [id number] Date of Birth: Oct 30, 1970

## 2017-11-17 ENCOUNTER — Ambulatory Visit (HOSPITAL_COMMUNITY): Payer: Managed Care, Other (non HMO)

## 2017-11-17 ENCOUNTER — Encounter (HOSPITAL_COMMUNITY): Payer: Self-pay

## 2017-11-17 DIAGNOSIS — M25562 Pain in left knee: Secondary | ICD-10-CM

## 2017-11-17 DIAGNOSIS — M25662 Stiffness of left knee, not elsewhere classified: Secondary | ICD-10-CM

## 2017-11-17 DIAGNOSIS — M6281 Muscle weakness (generalized): Secondary | ICD-10-CM

## 2017-11-17 DIAGNOSIS — R262 Difficulty in walking, not elsewhere classified: Secondary | ICD-10-CM

## 2017-11-17 NOTE — Therapy (Signed)
[place], [place], [address] Phone: [phone]   Fax:  [phone]  Physical Therapy Treatment  Patient Details  Name: [name] MRN: [id number] Date of Birth: 24-Jun-1970 Referring Provider: Arther Abbott, MD   Encounter Date: 11/17/2017  PT End of Session - 11/17/17 1350    Visit Number  4    Number of Visits  19    Date for PT Re-Evaluation  12/19/17 mini reassessment on 11/28/17    Authorization Type  [employer] (60 DAY limit for coverage, NOT 60 VISITS -- 10/21/17 to 11/2817 is her 52 DAY LIMIT, hard max, visits cannot be scheduled after 12/20/17) updated on 11/17/17 as this clinic determined that her 60day coverage began when she started HHPT    Authorization Time Period  11/07/17 to 12/19/17     Authorization - Visit Number  4 # of days since eval, not visit count    Authorization - Number of Visits  60 60 days from initial HHPT eval: 10/21/17 to 12/20/17 is her 60 day limit    PT Start Time  1347    PT Stop Time  1428    PT Time Calculation (min)  41 min    Activity Tolerance  Patient tolerated treatment well    Behavior During Therapy  [facility] for tasks assessed/performed       Past Medical History:  Diagnosis Date  . Arthritis   . Carpal tunnel syndrome, bilateral   . Diabetes mellitus without complication ([place])    no meds; diet controlled  . Diverticulitis   . Hypertension   . PONV (postoperative nausea and vomiting)   . Sciatica     Past Surgical History:  Procedure Laterality Date  . ablasion of uterus    . CARPAL TUNNEL RELEASE  11/10/2010   Procedure: CARPAL TUNNEL RELEASE;  Surgeon: Arther Abbott, MD;  [facility];  Service: Orthopedics;  Laterality: Right;  . CHOLECYSTECTOMY     [facility]  . COLON RESECTION N/A 04/08/2015   Procedure: LAPAROSCOPIC HAND ASSISTED PARTIAL COLECTOMY  CONVERTED TO OPEN AT 2706;  Surgeon: Aviva Signs, MD;  Location: [facility];  Service: General;  Laterality:  N/A;  . COLONOSCOPY N/A 03/28/2014   Procedure: COLONOSCOPY;  Surgeon: Rogene Houston, MD;  Location: [facility];  Service: Endoscopy;  Laterality: N/A;  730  . KNEE ARTHROSCOPY WITH DRILLING/MICROFRACTURE Left 10/20/2016   Procedure: LEFT KNEE ARTHROSCOPY WITH MICROFRACTURE;  Surgeon: Carole Civil, MD;  Location: [facility];  Service: Orthopedics;  Laterality: Left;  . KNEE ARTHROSCOPY WITH DRILLING/MICROFRACTURE Left 04/27/2017   Procedure: KNEE ARTHROSCOPY WITH DRILLING/MICROFRACTURE;  Surgeon: Carole Civil, MD;  Location: [facility];  Service: Orthopedics;  Laterality: Left;  . KNEE ARTHROSCOPY WITH OSTEOCHONDRAL DEFECT REPAIR Left 04/27/2017   Procedure: KNEE ARTHROSCOPY WITH OSTEOCHONDRAL DEFECT REPAIR Autograft;  Surgeon: Carole Civil, MD;  Location: [facility];  Service: Orthopedics;  Laterality: Left;  . KNEE SURGERY    . PARTIAL COLECTOMY N/A 04/08/2015   Procedure: PARTIAL COLECTOMY;  Surgeon: Aviva Signs, MD;  Location: [facility];  Service: General;  Laterality: N/A;  . right knee arthroscopy    . TOTAL KNEE ARTHROPLASTY Left 10/17/2017   Procedure: TOTAL KNEE ARTHROPLASTY;  Surgeon: Carole Civil, MD;  Location: [facility];  Service: Orthopedics;  Laterality: Left;  DePuy   . TUBAL LIGATION      There were no vitals filed for this visit.  Subjective Assessment - 11/17/17 1352  Subjective  Pt reports that she is still hurting, 3/10 L knee.    Currently in Pain?  Yes    Pain Score  3     Pain Location  Knee    Pain Orientation  Left    Pain Descriptors / Indicators  Aching    Pain Type  Acute pain    Pain Onset  1 to 4 weeks ago    Pain Frequency  Constant    Aggravating Factors   activity    Pain Relieving Factors  ice    Effect of Pain on Daily Activities  limits            [facility] Adult PT Treatment/Exercise - 11/17/17 0001      Knee/Hip Exercises: Stretches   Active Hamstring Stretch  Left;3 reps;30 seconds;Limitations    Active Hamstring Stretch  Limitations  12" step standing    Knee: Self-Stretch to increase Flexion  Left;10 seconds;Limitations    Knee: Self-Stretch Limitations  10 reps 12" box    Gastroc Stretch  Both;3 reps;30 seconds;Limitations    Gastroc Stretch Limitations  slant board      Knee/Hip Exercises: Standing   Heel Raises  Both;20 reps    Heel Raises Limitations  heel and toe    Knee Flexion  Left;20 reps    Terminal Knee Extension  Left    Terminal Knee Extension Limitations  10x10" GTB    Lateral Step Up  Left;10 reps;Hand Hold: 2;Step Height: 4"    Forward Step Up  Left;10 reps;Hand Hold: 2;Step Height: 4"    Rocker Board  2 minutes;Limitations    Rocker Board Limitations  R/L    SLS  5x10" on LLE      Knee/Hip Exercises: Seated   Long Arc Quad  Left;20 reps    Sit to [employer]  10 reps;without UE support      Knee/Hip Exercises: Supine   Short Arc Quad Sets  Left;15 reps;Limitations    Short Arc Quad Sets Limitations  basketball    Knee Extension Limitations  1    Knee Flexion Limitations  117      Manual Therapy   Manual Therapy  Edema management;Joint mobilization;Soft tissue mobilization;Myofascial release    Manual therapy comments  done seperate from all other aspects of treatment    Edema Management  retro massage with BLE elevated to decrease edema and pain    Joint Mobilization  L patellar mobs in all directions for improved mobility    Soft tissue mobilization  to decrease spasm in Lt quadricep    Myofascial Release  scar tissue and adhesions perimeter of knee and scar line             PT Education - 11/17/17 1352    Education Details  Updated HEP    Person(s) Educated  Patient    Methods  Explanation;Demonstration;Handout    Comprehension  Verbalized understanding;Returned demonstration       PT Short Term Goals - 11/09/17 1453      PT SHORT TERM GOAL #1   Title  Pt will be independent with HEP and perform consistently in order to decrease pain and maximize ROM.    Time  3     Period  Weeks    Status  On-going      PT SHORT TERM GOAL #2   Title  Pt will have improved L knee AROM from 5-120 deg to maximize gait.    Time  3  Period  Weeks    Status  On-going      PT SHORT TERM GOAL #3   Title  Pt will have decreased L knee joint line edema by 2cm or > in order to maximize ROM and decrease pain.    Time  3    Period  Weeks    Status  On-going      PT SHORT TERM GOAL #4   Title  Pt will have 1/2 grade improvement in MMT throughout in order to maximize gait and functional mobility.    Time  3    Period  Weeks    Status  On-going        PT Long Term Goals - 11/09/17 1454      PT LONG TERM GOAL #1   Title  Pt will have improved L knee AROM from 0-125deg in order to further maximize gait as well as sitting tolerance and stair ambulation.    Time  6    Period  Weeks    Status  On-going      PT LONG TERM GOAL #2   Title  Pt will be able to perform 5xSTS in 10 sec or < with no UE and mechanics WFL to demo improved balance and functional strength.    Time  6    Period  Weeks    Status  On-going      PT LONG TERM GOAL #3   Title  Pt will be able to perform L SLS for 30 sec or > to demo improved balance and functional strength in order to maximize gait and stair ambulation.    Time  6    Period  Weeks    Status  Achieved      PT LONG TERM GOAL #4   Title  Pt will be able to ambulate 665ft during 3MWT with LRAD and gait WFL in order to maximize return to PLOF.    Time  6    Period  Weeks    Status  On-going      PT LONG TERM GOAL #5   Title  Pt will have 1 grade improvement throughout BLE MMT in order to further maximize her gait and balance and overall retrun to PLOF.    Time  6    Period  Weeks    Status  On-going            Plan - 11/17/17 1428    Clinical Impression Statement  Continued with established POC focusing on ROM and initiated slightly progressed strengthening exercises this date to include step ups and sit to stands for  quad and hip strengthening since pt's AROM has begun to normalize. Pt tolerating all therex well, not reporting increased pain. Updated HEP to include TKE, SAQ, and standing knee flexion. Ended with manual for edema control and patellar mobility to improve L knee AROM. AROM 1 to 117deg this date. Continue as planned, progressing as able.     Rehab Potential  Good    PT Frequency  3x / week    PT Duration  6 weeks    PT Treatment/Interventions  ADLs/Self Care Home Management;Aquatic Therapy;Biofeedback;Cryotherapy;Electrical Stimulation;Ultrasound;DME Instruction;Gait training;Stair training;Functional mobility training;Therapeutic activities;Therapeutic exercise;Balance training;Neuromuscular re-education;Patient/family education;Manual techniques;Scar mobilization;Passive range of motion;Dry needling;Taping    PT Next Visit Plan  continue to focus on decreasing edema and improving ROM initially,continue to steadily progress to strengthening now that AROM has started to normalize    PT Home Exercise Plan  eval:  quad sets, heel slides, supine hamstring stretch; 7/26: TKE GTB, SAQ, standing knee flexion    Consulted and Agree with Plan of Care  Patient       Patient will benefit from skilled therapeutic intervention in order to improve the following deficits and impairments:  Decreased activity tolerance, Decreased balance, Decreased endurance, Decreased range of motion, Decreased strength, Decreased scar mobility, Difficulty walking, Hypomobility, Increased edema, Increased muscle spasms, Increased fascial restricitons, Impaired flexibility, Improper body mechanics, Pain  Visit Diagnosis: Acute pain of left knee  Stiffness of left knee, not elsewhere classified  Muscle weakness (generalized)  Difficulty in walking, not elsewhere classified     Problem List Patient Active Problem List   Diagnosis Date Noted  . 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 Chondral defect of condyle of  right femur   . S/P left knee arthroscopy 04/27/17 03/28/2017  . 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 Abdominal pain 02/17/2015  . Nausea without vomiting 02/17/2015  . Diverticulitis of intestine with abscess 01/08/2015  . Acute diverticulitis 01/08/2015  . Nausea 12/31/2014  . Diverticulitis 12/31/2014  . Hyperglycemia 12/31/2014  . Medication reaction 12/31/2014  . Diverticulitis of large intestine without perforation or abscess without bleeding   . Thrush, oral   . Fatty liver 03/11/2014  . Family hx of colon cancer requiring screening colonoscopy 03/11/2014  . Rt flank pain 03/11/2014  . Ulnar nerve compression 06/14/2011  . 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 LOW BACK PAIN 11/07/2007  . SCIATICA 11/07/2007  . LUMBAR SPASM 11/07/2007       Geraldine Solar PT, DPT  [facility] [address] [place], [place], [address] Phone: [phone]   Fax:  [phone]  Name: [name] MRN: [id number] Date of Birth: 1970/05/23

## 2017-11-21 ENCOUNTER — Ambulatory Visit (HOSPITAL_COMMUNITY): Payer: Managed Care, Other (non HMO) | Admitting: Physical Therapy

## 2017-11-21 DIAGNOSIS — R262 Difficulty in walking, not elsewhere classified: Secondary | ICD-10-CM

## 2017-11-21 DIAGNOSIS — M6281 Muscle weakness (generalized): Secondary | ICD-10-CM

## 2017-11-21 DIAGNOSIS — M25562 Pain in left knee: Secondary | ICD-10-CM

## 2017-11-21 DIAGNOSIS — M25662 Stiffness of left knee, not elsewhere classified: Secondary | ICD-10-CM

## 2017-11-21 NOTE — Therapy (Signed)
[place], [place], [address] Phone: [phone]   Fax:  [phone]  Physical Therapy Treatment  Patient Details  Name: [name] MRN: [id number] Date of Birth: [birth year] Referring Provider: Arther Abbott, MD   Encounter Date: 11/21/2017  PT End of Session - 11/21/17 1550    Visit Number  5    Number of Visits  19    Date for PT Re-Evaluation  12/19/17 mini reassessment on 11/28/17    Authorization Type  [employer] Man[employer] (60 DAY limit for coverage, NOT 60 VISITS -- 10/21/17 to 11/2817 is her 57 DAY LIMIT, hard max, visits cannot be scheduled after 12/20/17) updated on 11/17/17 as this clinic determined that her 60day coverage began when she started HHPT    Authorization Time Period  11/07/17 to 12/19/17     Authorization - Visit Number  5 # of days since eval, not visit count    Authorization - Number of Visits  60 60 days from initial HHPT eval: 10/21/17 to 12/20/17 is her 60 day limit    PT Start Time  1345    PT Stop Time  1430    PT Time Calculation (min)  45 min    Activity Tolerance  Patient tolerated treatment well    Behavior During Therapy  [facility] for tasks assessed/performed       Past Medical History:  Diagnosis Date  . Arthritis   . Carpal tunnel syndrome, bilateral   . Diabetes mellitus without complication ([place])    no meds; diet controlled  . Diverticulitis   . Hypertension   . PONV (postoperative nausea and vomiting)   . Sciatica     Past Surgical History:  Procedure Laterality Date  . ablasion of uterus    . CARPAL TUNNEL RELEASE  11/10/2010   Procedure: CARPAL TUNNEL RELEASE;  Surgeon: Arther Abbott, MD;  Location: [facility];  Service: Orthopedics;  Laterality: Right;  . CHOLECYSTECTOMY     [facility]  . COLON RESECTION N/A 04/08/2015   Procedure: LAPAROSCOPIC HAND ASSISTED PARTIAL COLECTOMY  CONVERTED TO OPEN AT 4970;  Surgeon: Aviva Signs, MD;  Location: [facility];  Service: General;  Laterality:  N/A;  . COLONOSCOPY N/A 03/28/2014   Procedure: COLONOSCOPY;  Surgeon: Rogene Houston, MD;  [facility];  Service: Endoscopy;  Laterality: N/A;  730  . KNEE ARTHROSCOPY WITH DRILLING/MICROFRACTURE Left 10/20/2016   Procedure: LEFT KNEE ARTHROSCOPY WITH MICROFRACTURE;  Surgeon: Carole Civil, MD;  Location: [facility];  Service: Orthopedics;  Laterality: Left;  . KNEE ARTHROSCOPY WITH DRILLING/MICROFRACTURE Left 04/27/2017   Procedure: KNEE ARTHROSCOPY WITH DRILLING/MICROFRACTURE;  Surgeon: Carole Civil, MD;  Location: [facility];  Service: Orthopedics;  Laterality: Left;  . KNEE ARTHROSCOPY WITH OSTEOCHONDRAL DEFECT REPAIR Left 04/27/2017   Procedure: KNEE ARTHROSCOPY WITH OSTEOCHONDRAL DEFECT REPAIR Autograft;  Surgeon: Carole Civil, MD;  Location: [facility];  Service: Orthopedics;  Laterality: Left;  . KNEE SURGERY    . PARTIAL COLECTOMY N/A 04/08/2015   Procedure: PARTIAL COLECTOMY;  Surgeon: Aviva Signs, MD;  Location: [facility];  Service: General;  Laterality: N/A;  . right knee arthroscopy    . TOTAL KNEE ARTHROPLASTY Left 10/17/2017   Procedure: TOTAL KNEE ARTHROPLASTY;  Surgeon: Carole Civil, MD;  Location: [facility];  Service: Orthopedics;  Laterality: Left;  DePuy   . TUBAL LIGATION      There were no vitals filed for this visit.  Subjective Assessment - 11/21/17 1351  Subjective  Pt states she is still having 3/10 pain in Lt knee.      Currently in Pain?  Yes    Pain Score  3     Pain Location  Knee    Pain Orientation  Left    Pain Descriptors / Indicators  Aching                       [facility] Adult PT Treatment/Exercise - 11/21/17 0001      Knee/Hip Exercises: Stretches   Active Hamstring Stretch  Left;3 reps;30 seconds;Limitations    Active Hamstring Stretch Limitations  12" step standing    Knee: Self-Stretch to increase Flexion  Left;10 seconds;Limitations    Knee: Self-Stretch Limitations  10 reps 12" box    Gastroc Stretch  Both;3  reps;30 seconds;Limitations    Gastroc Stretch Limitations  slant board      Knee/Hip Exercises: Standing   Knee Flexion  Left;20 reps    Forward Lunges  Both;10 reps;Limitations    Forward Lunges Limitations  4" step no UE's    Lateral Step Up  Left;10 reps;Hand Hold: 2;Step Height: 4"    Forward Step Up  Left;10 reps;Hand Hold: 2;Step Height: 4"    Step Down  Left;10 reps;Hand Hold: 1;Step Height: 2"    Rocker Board  2 minutes;Limitations    Rocker Board Limitations  R/L therapist assist to complete correctly      Knee/Hip Exercises: Seated   Sit to [employer]  10 reps;without UE support      Knee/Hip Exercises: Supine   Short Arc Quad Sets  Left;15 reps;Limitations    Short Arc Quad Sets Limitations  basketball; able to get to 20 degrees extension    Straight Leg Raises  Left;10 reps;Limitations    Straight Leg Raises Limitations  with 12 degree extension lag    Knee Extension  AROM;Limitations    Knee Extension Limitations  4    Knee Flexion  AROM;Limitations    Knee Flexion Limitations  115      Manual Therapy   Manual Therapy  Edema management;Joint mobilization;Soft tissue mobilization;Myofascial release    Manual therapy comments  done seperate from all other aspects of treatment    Edema Management  retro massage with BLE elevated to decrease edema and pain    Joint Mobilization  L patellar mobs in all directions for improved mobility    Soft tissue mobilization  to decrease spasm in Lt quadricep    Myofascial Release  scar tissue and adhesions perimeter of knee and scar line               PT Short Term Goals - 11/09/17 1453      PT SHORT TERM GOAL #1   Title  Pt will be independent with HEP and perform consistently in order to decrease pain and maximize ROM.    Time  3    Period  Weeks    Status  On-going      PT SHORT TERM GOAL #2   Title  Pt will have improved L knee AROM from 5-120 deg to maximize gait.    Time  3    Period  Weeks    Status  On-going       PT SHORT TERM GOAL #3   Title  Pt will have decreased L knee joint line edema by 2cm or > in order to maximize ROM and decrease pain.    Time  3  Period  Weeks    Status  On-going      PT SHORT TERM GOAL #4   Title  Pt will have 1/2 grade improvement in MMT throughout in order to maximize gait and functional mobility.    Time  3    Period  Weeks    Status  On-going        PT Long Term Goals - 11/09/17 1454      PT LONG TERM GOAL #1   Title  Pt will have improved L knee AROM from 0-125deg in order to further maximize gait as well as sitting tolerance and stair ambulation.    Time  6    Period  Weeks    Status  On-going      PT LONG TERM GOAL #2   Title  Pt will be able to perform 5xSTS in 10 sec or < with no UE and mechanics WFL to demo improved balance and functional strength.    Time  6    Period  Weeks    Status  On-going      PT LONG TERM GOAL #3   Title  Pt will be able to perform L SLS for 30 sec or > to demo improved balance and functional strength in order to maximize gait and stair ambulation.    Time  6    Period  Weeks    Status  Achieved      PT LONG TERM GOAL #4   Title  Pt will be able to ambulate 633ft during 3MWT with LRAD and gait WFL in order to maximize return to PLOF.    Time  6    Period  Weeks    Status  On-going      PT LONG TERM GOAL #5   Title  Pt will have 1 grade improvement throughout BLE MMT in order to further maximize her gait and balance and overall retrun to PLOF.    Time  6    Period  Weeks    Status  On-going            Plan - 11/21/17 1731    Clinical Impression Statement  PT with noted weakness in quad today with inability to complete SLR without approx 12 degree lag, SAQ only to 20 degrees from full extension.  Added forward step downs with 2" step to work on eccetnric strength and continued wtih manual to reduce scar tissue and edema.  ROM achieved today 4-115 degrees.     Rehab Potential  Good    PT Frequency  3x /  week    PT Duration  6 weeks    PT Treatment/Interventions  ADLs/Self Care Home Management;Aquatic Therapy;Biofeedback;Cryotherapy;Electrical Stimulation;Ultrasound;DME Instruction;Gait training;Stair training;Functional mobility training;Therapeutic activities;Therapeutic exercise;Balance training;Neuromuscular re-education;Patient/family education;Manual techniques;Scar mobilization;Passive range of motion;Dry needling;Taping    PT Next Visit Plan  continue to focus on decreasing edema and quad strengthening.      PT Home Exercise Plan  eval: quad sets, heel slides, supine hamstring stretch; 7/26: TKE GTB, SAQ, standing knee flexion    Consulted and Agree with Plan of Care  Patient       Patient will benefit from skilled therapeutic intervention in order to improve the following deficits and impairments:  Decreased activity tolerance, Decreased balance, Decreased endurance, Decreased range of motion, Decreased strength, Decreased scar mobility, Difficulty walking, Hypomobility, Increased edema, Increased muscle spasms, Increased fascial restricitons, Impaired flexibility, Improper body mechanics, Pain  Visit Diagnosis: Acute pain of left knee  Stiffness of left knee, not elsewhere classified  Muscle weakness (generalized)  Difficulty in walking, not elsewhere classified     Problem List Patient Active Problem List   Diagnosis Date Noted  . 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 Chondral defect of condyle of right femur   . S/P left knee arthroscopy 04/27/17 03/28/2017  . 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 Abdominal pain 02/17/2015  . Nausea without vomiting 02/17/2015  . Diverticulitis of intestine with abscess 01/08/2015  . Acute diverticulitis 01/08/2015  . Nausea 12/31/2014  . Diverticulitis 12/31/2014  . Hyperglycemia 12/31/2014  . Medication reaction 12/31/2014  . Diverticulitis of large intestine without  perforation or abscess without bleeding   . Thrush, oral   . Fatty liver 03/11/2014  . Family hx of colon cancer requiring screening colonoscopy 03/11/2014  . Rt flank pain 03/11/2014  . Ulnar nerve compression 06/14/2011  . 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 LOW BACK PAIN 11/07/2007  . SCIATICA 11/07/2007  . LUMBAR SPASM 11/07/2007   Teena Irani, PTA/CLT [phone]  Teena Irani 11/21/2017, 5:34 PM  [facility] [address] [place], [place], [address] Phone: [phone]   Fax:  [phone]  Name: [name] MRN: [id number] Date of Birth: 12-Jun-1970

## 2017-11-22 ENCOUNTER — Telehealth: Payer: Self-pay | Admitting: Orthopedic Surgery

## 2017-11-22 DIAGNOSIS — Z96652 Presence of left artificial knee joint: Secondary | ICD-10-CM

## 2017-11-22 MED ORDER — OXYCODONE-ACETAMINOPHEN 10-325 MG PO TABS: 1.0000 | ORAL_TABLET | ORAL | 0 refills | Status: DC | PRN

## 2017-11-22 NOTE — Telephone Encounter (Signed)
Patient of Dr.Harrison's requests refill on Oxycodone/Acetaminophen 10/325 mgs.  Qty  42       Sig: Take 1 tablet by mouth every 4 (four) hours as needed for pain.   Patient uses [facility] in [place]

## 2017-11-23 ENCOUNTER — Ambulatory Visit (HOSPITAL_COMMUNITY): Payer: Managed Care, Other (non HMO) | Attending: Orthopedic Surgery | Admitting: Physical Therapy

## 2017-11-23 DIAGNOSIS — M25662 Stiffness of left knee, not elsewhere classified: Secondary | ICD-10-CM

## 2017-11-23 DIAGNOSIS — R262 Difficulty in walking, not elsewhere classified: Secondary | ICD-10-CM

## 2017-11-23 DIAGNOSIS — M6281 Muscle weakness (generalized): Secondary | ICD-10-CM

## 2017-11-23 DIAGNOSIS — M25562 Pain in left knee: Secondary | ICD-10-CM

## 2017-11-23 NOTE — Therapy (Signed)
[address], [place], [address] Phone: [phone]   Fax:  [phone]  Physical Therapy Treatment  Patient Details  Name: [name] MRN: [id number] Date of Birth: 05/03/1970 Referring Provider: Arther Abbott, MD   Encounter Date: 11/23/2017  PT End of Session - 11/23/17 1430    Visit Number  6    Number of Visits  19    Date for PT Re-Evaluation  12/19/17 mini reassessment on 11/28/17    Authorization Type  [employer] (60 DAY limit for coverage, NOT 60 VISITS -- 10/21/17 to 11/2817 is her 74 DAY LIMIT, hard max, visits cannot be scheduled after 12/20/17) updated on 11/17/17 as this clinic determined that her 60day coverage began when she started HHPT    Authorization Time Period  11/07/17 to 12/19/17     Authorization - Visit Number  6 # of days since eval, not visit count    Authorization - Number of Visits  60 60 days from initial HHPT eval: 10/21/17 to 12/20/17 is her 60 day limit    PT Start Time  1350    PT Stop Time  1430    PT Time Calculation (min)  40 min    Activity Tolerance  Patient tolerated treatment well    Behavior During Therapy  [facility] for tasks assessed/performed       Past Medical History:  Diagnosis Date  . Arthritis   . Carpal tunnel syndrome, bilateral   . Diabetes mellitus without complication ([place])    no meds; diet controlled  . Diverticulitis   . Hypertension   . PONV (postoperative nausea and vomiting)   . Sciatica     Past Surgical History:  Procedure Laterality Date  . ablasion of uterus    . CARPAL TUNNEL RELEASE  11/10/2010   Procedure: CARPAL TUNNEL RELEASE;  Surgeon: Arther Abbott, MD;  Location: [facility];  Service: Orthopedics;  Laterality: Right;  . CHOLECYSTECTOMY     [facility]  . COLON RESECTION N/A 04/08/2015   Procedure: LAPAROSCOPIC HAND ASSISTED PARTIAL COLECTOMY  CONVERTED TO OPEN AT 9678;  Surgeon: Aviva Signs, MD;  Location: [facility];  Service: General;  Laterality:  N/A;  . COLONOSCOPY N/A 03/28/2014   Procedure: COLONOSCOPY;  Surgeon: Rogene Houston, MD;  Location: [facility];  Service: Endoscopy;  Laterality: N/A;  730  . KNEE ARTHROSCOPY WITH DRILLING/MICROFRACTURE Left 10/20/2016   Procedure: LEFT KNEE ARTHROSCOPY WITH MICROFRACTURE;  Surgeon: Carole Civil, MD;  Location: [facility];  Service: Orthopedics;  Laterality: Left;  . KNEE ARTHROSCOPY WITH DRILLING/MICROFRACTURE Left 04/27/2017   Procedure: KNEE ARTHROSCOPY WITH DRILLING/MICROFRACTURE;  Surgeon: Carole Civil, MD;  Location: [facility];  Service: Orthopedics;  Laterality: Left;  . KNEE ARTHROSCOPY WITH OSTEOCHONDRAL DEFECT REPAIR Left 04/27/2017   Procedure: KNEE ARTHROSCOPY WITH OSTEOCHONDRAL DEFECT REPAIR Autograft;  Surgeon: Carole Civil, MD;  Location: [facility];  Service: Orthopedics;  Laterality: Left;  . KNEE SURGERY    . PARTIAL COLECTOMY N/A 04/08/2015   Procedure: PARTIAL COLECTOMY;  Surgeon: Aviva Signs, MD;  Location: [facility];  Service: General;  Laterality: N/A;  . right knee arthroscopy    . TOTAL KNEE ARTHROPLASTY Left 10/17/2017   Procedure: TOTAL KNEE ARTHROPLASTY;  Surgeon: Carole Civil, MD;  Location: [facility];  Service: Orthopedics;  Laterality: Left;  DePuy   . TUBAL LIGATION      There were no vitals filed for this visit.  Subjective Assessment - 11/23/17 1354  Subjective  PT reports 2/10 pain in her LT knee today.  States it is still a little irritated.    Currently in Pain?  Yes    Pain Score  2     Pain Location  Knee    Pain Orientation  Left    Pain Descriptors / Indicators  Aching                       [facility] Adult PT Treatment/Exercise - 11/23/17 0001      Knee/Hip Exercises: Stretches   Active Hamstring Stretch  Left;3 reps;30 seconds;Limitations    Active Hamstring Stretch Limitations  12" step standing    Knee: Self-Stretch to increase Flexion  Left;10 seconds;Limitations    Knee: Self-Stretch Limitations  10 reps 12"  box    Gastroc Stretch  Both;3 reps;30 seconds;Limitations    Gastroc Stretch Limitations  slant board      Knee/Hip Exercises: Standing   Knee Flexion  Left;20 reps    Forward Lunges  Both;10 reps;Limitations    Forward Lunges Limitations  4" step no UE's    Lateral Step Up  Left;10 reps;Hand Hold: 2;Step Height: 4"    Forward Step Up  Left;10 reps;Step Height: 4";Hand Hold: 0    Step Down  Left;10 reps;Hand Hold: 1;Step Height: 2"    Rocker Board  2 minutes;Limitations    Rocker Board Limitations  R/L therapist assist to complete correctly      Knee/Hip Exercises: Supine   Knee Extension Limitations  4    Knee Flexion Limitations  120      Manual Therapy   Manual Therapy  Edema management;Joint mobilization;Soft tissue mobilization;Myofascial release    Manual therapy comments  done seperate from all other aspects of treatment    Edema Management  retro massage with BLE elevated to decrease edema and pain    Joint Mobilization  L patellar mobs in all directions for improved mobility    Soft tissue mobilization  to decrease spasm in Lt quadricep    Myofascial Release  scar tissue and adhesions perimeter of knee and scar line               PT Short Term Goals - 11/09/17 1453      PT SHORT TERM GOAL #1   Title  Pt will be independent with HEP and perform consistently in order to decrease pain and maximize ROM.    Time  3    Period  Weeks    Status  On-going      PT SHORT TERM GOAL #2   Title  Pt will have improved L knee AROM from 5-120 deg to maximize gait.    Time  3    Period  Weeks    Status  On-going      PT SHORT TERM GOAL #3   Title  Pt will have decreased L knee joint line edema by 2cm or > in order to maximize ROM and decrease pain.    Time  3    Period  Weeks    Status  On-going      PT SHORT TERM GOAL #4   Title  Pt will have 1/2 grade improvement in MMT throughout in order to maximize gait and functional mobility.    Time  3    Period  Weeks     Status  On-going        PT Long Term Goals - 11/09/17 1454  PT LONG TERM GOAL #1   Title  Pt will have improved L knee AROM from 0-125deg in order to further maximize gait as well as sitting tolerance and stair ambulation.    Time  6    Period  Weeks    Status  On-going      PT LONG TERM GOAL #2   Title  Pt will be able to perform 5xSTS in 10 sec or < with no UE and mechanics WFL to demo improved balance and functional strength.    Time  6    Period  Weeks    Status  On-going      PT LONG TERM GOAL #3   Title  Pt will be able to perform L SLS for 30 sec or > to demo improved balance and functional strength in order to maximize gait and stair ambulation.    Time  6    Period  Weeks    Status  Achieved      PT LONG TERM GOAL #4   Title  Pt will be able to ambulate 612ft during 3MWT with LRAD and gait WFL in order to maximize return to PLOF.    Time  6    Period  Weeks    Status  On-going      PT LONG TERM GOAL #5   Title  Pt will have 1 grade improvement throughout BLE MMT in order to further maximize her gait and balance and overall retrun to PLOF.    Time  6    Period  Weeks    Status  On-going            Plan - 11/23/17 1431    Clinical Impression Statement  Overall improvement with reduction of edema and redness today.  Pt also reports reduced pain and stiffness.  able to achieve 4-120 degrees today, 5 degree improvement in flexion.  Pt encouraged to continue to focus on improving quad strength and increasing extension.  Manual continued with overall reduction of scar tissue noted.     Rehab Potential  Good    PT Frequency  3x / week    PT Duration  6 weeks    PT Treatment/Interventions  ADLs/Self Care Home Management;Aquatic Therapy;Biofeedback;Cryotherapy;Electrical Stimulation;Ultrasound;DME Instruction;Gait training;Stair training;Functional mobility training;Therapeutic activities;Therapeutic exercise;Balance training;Neuromuscular  re-education;Patient/family education;Manual techniques;Scar mobilization;Passive range of motion;Dry needling;Taping    PT Next Visit Plan  continue to focus on decreasing edema and quad strengthening.      PT Home Exercise Plan  eval: quad sets, heel slides, supine hamstring stretch; 7/26: TKE GTB, SAQ, standing knee flexion    Consulted and Agree with Plan of Care  Patient       Patient will benefit from skilled therapeutic intervention in order to improve the following deficits and impairments:  Decreased activity tolerance, Decreased balance, Decreased endurance, Decreased range of motion, Decreased strength, Decreased scar mobility, Difficulty walking, Hypomobility, Increased edema, Increased muscle spasms, Increased fascial restricitons, Impaired flexibility, Improper body mechanics, Pain  Visit Diagnosis: Acute pain of left knee  Stiffness of left knee, not elsewhere classified  Muscle weakness (generalized)  Difficulty in walking, not elsewhere classified     Problem List Patient Active Problem List   Diagnosis Date Noted  . 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 Chondral defect of condyle of right femur   . S/P left knee arthroscopy 04/27/17 03/28/2017  . 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 Chondral defect of condyle of  left femur   . Abdominal pain 02/17/2015  . Nausea without vomiting 02/17/2015  . Diverticulitis of intestine with abscess 01/08/2015  . Acute diverticulitis 01/08/2015  . Nausea 12/31/2014  . Diverticulitis 12/31/2014  . Hyperglycemia 12/31/2014  . Medication reaction 12/31/2014  . Diverticulitis of large intestine without perforation or abscess without bleeding   . Thrush, oral   . Fatty liver 03/11/2014  . Family hx of colon cancer requiring screening colonoscopy 03/11/2014  . Rt flank pain 03/11/2014  . Ulnar nerve compression 06/14/2011  . 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 LOW BACK PAIN 11/07/2007  .  SCIATICA 11/07/2007  . LUMBAR SPASM 11/07/2007   Teena Irani, PTA/CLT [phone]  Teena Irani 11/23/2017, 2:33 PM  [place] [address] [place], [place], [address] Phone: [phone]   Fax:  [phone]  Name: AQUEELAH COTRELL MRN: [id number] Date of Birth: [birth year]

## 2017-11-27 ENCOUNTER — Ambulatory Visit (HOSPITAL_COMMUNITY): Payer: Managed Care, Other (non HMO) | Admitting: Physical Therapy

## 2017-11-27 DIAGNOSIS — R262 Difficulty in walking, not elsewhere classified: Secondary | ICD-10-CM

## 2017-11-27 DIAGNOSIS — M25562 Pain in left knee: Secondary | ICD-10-CM

## 2017-11-27 DIAGNOSIS — M6281 Muscle weakness (generalized): Secondary | ICD-10-CM

## 2017-11-27 DIAGNOSIS — M25662 Stiffness of left knee, not elsewhere classified: Secondary | ICD-10-CM

## 2017-11-27 NOTE — Therapy (Signed)
[place], [place], [address] Phone: [phone]   Fax:  [phone]  Physical Therapy Treatment  Patient Details  Name: [name] MRN: [id number] Date of Birth: [birth year] Referring Provider: Arther Abbott, MD   Encounter Date: 11/27/2017  PT End of Session - 11/27/17 1534    Visit Number  7    Number of Visits  19    Date for PT Re-Evaluation  12/19/17 mini reassessment on 11/28/17    Authorization Type  [employer] (60 DAY limit for coverage, NOT 60 VISITS -- 10/21/17 to 11/2817 is her 60 DAY LIMIT, hard max, visits cannot be scheduled after 12/20/17) updated on 11/17/17 as this clinic determined that her 60day coverage began when she started HHPT    Authorization Time Period  11/07/17 to 12/19/17     Authorization - Visit Number  7 # of days since eval, not visit count    Authorization - Number of Visits  60 60 days from initial HHPT eval: 10/21/17 to 12/20/17 is her 60 day limit    PT Start Time  1436    PT Stop Time  1515    PT Time Calculation (min)  39 min    Activity Tolerance  Patient tolerated treatment well    Behavior During Therapy  [facility] for tasks assessed/performed       Past Medical History:  Diagnosis Date  . Arthritis   . Carpal tunnel syndrome, bilateral   . Diabetes mellitus without complication ([place])    no meds; diet controlled  . Diverticulitis   . Hypertension   . PONV (postoperative nausea and vomiting)   . Sciatica     Past Surgical History:  Procedure Laterality Date  . ablasion of uterus    . CARPAL TUNNEL RELEASE  11/10/2010   Procedure: CARPAL TUNNEL RELEASE;  Surgeon: Arther Abbott, MD;  Location: [facility];  Service: Orthopedics;  Laterality: Right;  . CHOLECYSTECTOMY     [facility]  . COLON RESECTION N/A 04/08/2015   Procedure: LAPAROSCOPIC HAND ASSISTED PARTIAL COLECTOMY  CONVERTED TO OPEN AT 3557;  Surgeon: Aviva Signs, MD;  Location: [facility];  Service: General;  Laterality:  N/A;  . COLONOSCOPY N/A 03/28/2014   Procedure: COLONOSCOPY;  Surgeon: Rogene Houston, MD;  [facility];  Service: Endoscopy;  Laterality: N/A;  730  . KNEE ARTHROSCOPY WITH DRILLING/MICROFRACTURE Left 10/20/2016   Procedure: LEFT KNEE ARTHROSCOPY WITH MICROFRACTURE;  Surgeon: Carole Civil, MD;  Location: [facility];  Service: Orthopedics;  Laterality: Left;  . KNEE ARTHROSCOPY WITH DRILLING/MICROFRACTURE Left 04/27/2017   Procedure: KNEE ARTHROSCOPY WITH DRILLING/MICROFRACTURE;  Surgeon: Carole Civil, MD;  Location: [facility];  Service: Orthopedics;  Laterality: Left;  . KNEE ARTHROSCOPY WITH OSTEOCHONDRAL DEFECT REPAIR Left 04/27/2017   Procedure: KNEE ARTHROSCOPY WITH OSTEOCHONDRAL DEFECT REPAIR Autograft;  Surgeon: Carole Civil, MD;  Location: [facility];  Service: Orthopedics;  Laterality: Left;  . KNEE SURGERY    . PARTIAL COLECTOMY N/A 04/08/2015   Procedure: PARTIAL COLECTOMY;  Surgeon: Aviva Signs, MD;  Location: [facility];  Service: General;  Laterality: N/A;  . right knee arthroscopy    . TOTAL KNEE ARTHROPLASTY Left 10/17/2017   Procedure: TOTAL KNEE ARTHROPLASTY;  Surgeon: Carole Civil, MD;  Location: [facility];  Service: Orthopedics;  Laterality: Left;  DePuy   . TUBAL LIGATION      There were no vitals filed for this visit.  Subjective Assessment - 11/27/17 1439  Subjective  PT states her knee is feeling okay today.  2/10 pain in Lt knee today.    Currently in Pain?  Yes    Pain Score  2     Pain Location  Knee    Pain Orientation  Left    Pain Descriptors / Indicators  Aching    Pain Type  Acute pain                       [facility] Adult PT Treatment/Exercise - 11/27/17 0001      Knee/Hip Exercises: Stretches   Active Hamstring Stretch  Left;3 reps;30 seconds;Limitations    Active Hamstring Stretch Limitations  12" step standing    Knee: Self-Stretch to increase Flexion  Left;10 seconds;Limitations    Knee: Self-Stretch Limitations   10 reps 12" box    Gastroc Stretch  Both;3 reps;30 seconds;Limitations    Gastroc Stretch Limitations  slant board      Knee/Hip Exercises: Standing   Knee Flexion  Left;20 reps    Forward Lunges  Both;Limitations;15 reps    Forward Lunges Limitations  4" step no UE's    Lateral Step Up  Left;Hand Hold: 2;Step Height: 4";15 reps    Forward Step Up  Left;Step Height: 4";Hand Hold: 0;15 reps    Step Down  Left;Hand Hold: 1;Step Height: 2";15 reps    Rocker Board  2 minutes;Limitations    Rocker Board Limitations  R/L therapist assist to complete correctly      Knee/Hip Exercises: Supine   Short Arc Quad Sets  Left;15 reps;Limitations    Short Arc Quad Sets Limitations  15 degrees obtained (was 20)    Straight Leg Raises  Left;10 reps;Limitations    Straight Leg Raises Limitations  with 10 degree extension lag    Knee Extension Limitations  3    Knee Flexion Limitations  120      Manual Therapy   Manual Therapy  Edema management;Joint mobilization;Soft tissue mobilization;Myofascial release    Manual therapy comments  done seperate from all other aspects of treatment    Edema Management  retro massage with BLE elevated to decrease edema and pain    Joint Mobilization  L patellar mobs in all directions for improved mobility    Soft tissue mobilization  to decrease spasm in Lt quadricep    Myofascial Release  scar tissue and adhesions perimeter of knee and scar line               PT Short Term Goals - 11/09/17 1453      PT SHORT TERM GOAL #1   Title  Pt will be independent with HEP and perform consistently in order to decrease pain and maximize ROM.    Time  3    Period  Weeks    Status  On-going      PT SHORT TERM GOAL #2   Title  Pt will have improved L knee AROM from 5-120 deg to maximize gait.    Time  3    Period  Weeks    Status  On-going      PT SHORT TERM GOAL #3   Title  Pt will have decreased L knee joint line edema by 2cm or > in order to maximize ROM and  decrease pain.    Time  3    Period  Weeks    Status  On-going      PT SHORT TERM GOAL #4   Title  Pt will have 1/2 grade improvement in MMT throughout in order to maximize gait and functional mobility.    Time  3    Period  Weeks    Status  On-going        PT Long Term Goals - 11/09/17 1454      PT LONG TERM GOAL #1   Title  Pt will have improved L knee AROM from 0-125deg in order to further maximize gait as well as sitting tolerance and stair ambulation.    Time  6    Period  Weeks    Status  On-going      PT LONG TERM GOAL #2   Title  Pt will be able to perform 5xSTS in 10 sec or < with no UE and mechanics WFL to demo improved balance and functional strength.    Time  6    Period  Weeks    Status  On-going      PT LONG TERM GOAL #3   Title  Pt will be able to perform L SLS for 30 sec or > to demo improved balance and functional strength in order to maximize gait and stair ambulation.    Time  6    Period  Weeks    Status  Achieved      PT LONG TERM GOAL #4   Title  Pt will be able to ambulate 667ft during 3MWT with LRAD and gait WFL in order to maximize return to PLOF.    Time  6    Period  Weeks    Status  On-going      PT LONG TERM GOAL #5   Title  Pt will have 1 grade improvement throughout BLE MMT in order to further maximize her gait and balance and overall retrun to PLOF.    Time  6    Period  Weeks    Status  On-going            Plan - 11/27/17 1536    Clinical Impression Statement  continued with focus on improving edema and reducing limtations.  ROM today 3-120 and have been progressing functional strength.  Improved ease with forward and lateral step ups and able to increase to 4" at parallel bars with bilatera UE assist to control eccentric motion.  continued with scar massage.  Pt overall improving.     Rehab Potential  Good    PT Frequency  3x / week    PT Duration  6 weeks    PT Treatment/Interventions  ADLs/Self Care Home Management;Aquatic  Therapy;Biofeedback;Cryotherapy;Electrical Stimulation;Ultrasound;DME Instruction;Gait training;Stair training;Functional mobility training;Therapeutic activities;Therapeutic exercise;Balance training;Neuromuscular re-education;Patient/family education;Manual techniques;Scar mobilization;Passive range of motion;Dry needling;Taping    PT Next Visit Plan  continue to focus on decreasing edema and quad strengthening.      PT Home Exercise Plan  eval: quad sets, heel slides, supine hamstring stretch; 7/26: TKE GTB, SAQ, standing knee flexion    Consulted and Agree with Plan of Care  Patient       Patient will benefit from skilled therapeutic intervention in order to improve the following deficits and impairments:  Decreased activity tolerance, Decreased balance, Decreased endurance, Decreased range of motion, Decreased strength, Decreased scar mobility, Difficulty walking, Hypomobility, Increased edema, Increased muscle spasms, Increased fascial restricitons, Impaired flexibility, Improper body mechanics, Pain  Visit Diagnosis: Stiffness of left knee, not elsewhere classified  Muscle weakness (generalized)  Difficulty in walking, not elsewhere classified  Acute pain of left knee  Problem List Patient Active Problem List   Diagnosis Date Noted  . 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 Chondral defect of condyle of right femur   . S/P left knee arthroscopy 04/27/17 03/28/2017  . 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 Abdominal pain 02/17/2015  . Nausea without vomiting 02/17/2015  . Diverticulitis of intestine with abscess 01/08/2015  . Acute diverticulitis 01/08/2015  . Nausea 12/31/2014  . Diverticulitis 12/31/2014  . Hyperglycemia 12/31/2014  . Medication reaction 12/31/2014  . Diverticulitis of large intestine without perforation or abscess without bleeding   . Thrush, oral   . Fatty liver 03/11/2014  . Family hx of  colon cancer requiring screening colonoscopy 03/11/2014  . Rt flank pain 03/11/2014  . Ulnar nerve compression 06/14/2011  . 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 LOW BACK PAIN 11/07/2007  . SCIATICA 11/07/2007  . LUMBAR SPASM 11/07/2007   Teena Irani, PTA/CLT [phone]  Teena Irani 11/27/2017, 3:39 PM  [place] [address] [place], [place], [address] Phone: [phone]   Fax:  [phone]  Name: DARCHELLE NUNES MRN: [id number] Date of Birth: [birth year]

## 2017-11-29 ENCOUNTER — Telehealth: Payer: Self-pay | Admitting: Orthopedic Surgery

## 2017-11-29 ENCOUNTER — Ambulatory Visit (HOSPITAL_COMMUNITY): Payer: Managed Care, Other (non HMO) | Admitting: Physical Therapy

## 2017-11-29 DIAGNOSIS — M25562 Pain in left knee: Secondary | ICD-10-CM

## 2017-11-29 DIAGNOSIS — M25662 Stiffness of left knee, not elsewhere classified: Secondary | ICD-10-CM

## 2017-11-29 DIAGNOSIS — R262 Difficulty in walking, not elsewhere classified: Secondary | ICD-10-CM

## 2017-11-29 DIAGNOSIS — M6281 Muscle weakness (generalized): Secondary | ICD-10-CM

## 2017-11-29 DIAGNOSIS — M238X2 Other internal derangements of left knee: Secondary | ICD-10-CM

## 2017-11-29 DIAGNOSIS — Z96652 Presence of left artificial knee joint: Secondary | ICD-10-CM

## 2017-11-29 DIAGNOSIS — M1712 Unilateral primary osteoarthritis, left knee: Secondary | ICD-10-CM

## 2017-11-29 MED ORDER — PROMETHAZINE HCL 12.5 MG PO TABS: 12.5000 mg | ORAL_TABLET | Freq: Four times a day (QID) | ORAL | 0 refills | Status: DC | PRN

## 2017-11-29 MED ORDER — OXYCODONE-ACETAMINOPHEN 10-325 MG PO TABS: 1.0000 | ORAL_TABLET | ORAL | 0 refills | Status: DC | PRN

## 2017-11-29 NOTE — Therapy (Signed)
[facility] [address] [place], [place], [address] Phone: [phone]   Fax:  [phone]  Physical Therapy Treatment / Re-assessment  Patient Details  Name: [name] MRN: [id number] Date of Birth: [birth year] Referring Provider: Arther Abbott, MD   Encounter Date: 11/29/2017   Progress Note Reporting Period 11/07/17 to 11/29/17  See note below for Objective Data and Assessment of Progress/Goals.     Left knee AROM 3-120 degrees. Ambulated 434 feet with straight cane on 3MWT.   PT End of Session - 11/29/17 1343    Visit Number  8    Number of Visits  19    Date for PT Re-Evaluation  12/19/17 mini reassessment completed 11/29/17    Authorization Type  [employer] (60 DAY limit for coverage, NOT 60 VISITS -- 10/21/17 to 11/2817 is her 68 DAY LIMIT, hard max, visits cannot be scheduled after 12/20/17) updated on 11/17/17 as this clinic determined that her 60day coverage began when she started HHPT    Authorization Time Period  11/07/17 to 12/19/17     Authorization - Visit Number  22 # of days since eval, not visit count    Authorization - Number of Visits  60 60 days from initial HHPT eval: 10/21/17 to 12/20/17 is her 60 day limit    PT Start Time  1303    PT Stop Time  1346 Some time unbilled for re-assessment    PT Time Calculation (min)  43 min    Activity Tolerance  Patient tolerated treatment well    Behavior During Therapy  WFL for tasks assessed/performed       Past Medical History:  Diagnosis Date  . Arthritis   . Carpal tunnel syndrome, bilateral   . Diabetes mellitus without complication ([place])    no meds; diet controlled  . Diverticulitis   . Hypertension   . PONV (postoperative nausea and vomiting)   . Sciatica     Past Surgical History:  Procedure Laterality Date  . ablasion of uterus    . CARPAL TUNNEL RELEASE  11/10/2010   Procedure: CARPAL TUNNEL RELEASE;  Surgeon: Arther Abbott, MD;  Location: [facility];   Service: Orthopedics;  Laterality: Right;  . CHOLECYSTECTOMY     [facility]  . COLON RESECTION N/A 04/08/2015   Procedure: LAPAROSCOPIC HAND ASSISTED PARTIAL COLECTOMY  CONVERTED TO OPEN AT 1093;  Surgeon: Aviva Signs, MD;  Location: [facility];  Service: General;  Laterality: N/A;  . COLONOSCOPY N/A 03/28/2014   Procedure: COLONOSCOPY;  Surgeon: Rogene Houston, MD;  Location: [facility];  Service: Endoscopy;  Laterality: N/A;  730  . KNEE ARTHROSCOPY WITH DRILLING/MICROFRACTURE Left 10/20/2016   Procedure: LEFT KNEE ARTHROSCOPY WITH MICROFRACTURE;  Surgeon: Carole Civil, MD;  Location: [facility];  Service: Orthopedics;  Laterality: Left;  . KNEE ARTHROSCOPY WITH DRILLING/MICROFRACTURE Left 04/27/2017   Procedure: KNEE ARTHROSCOPY WITH DRILLING/MICROFRACTURE;  Surgeon: Carole Civil, MD;  Location: [facility];  Service: Orthopedics;  Laterality: Left;  . KNEE ARTHROSCOPY WITH OSTEOCHONDRAL DEFECT REPAIR Left 04/27/2017   Procedure: KNEE ARTHROSCOPY WITH OSTEOCHONDRAL DEFECT REPAIR Autograft;  Surgeon: Carole Civil, MD;  Location: [facility];  Service: Orthopedics;  Laterality: Left;  . KNEE SURGERY    . PARTIAL COLECTOMY N/A 04/08/2015   Procedure: PARTIAL COLECTOMY;  Surgeon: Aviva Signs, MD;  Location: [facility];  Service: General;  Laterality: N/A;  . right knee arthroscopy    . TOTAL KNEE ARTHROPLASTY Left 10/17/2017   Procedure:  TOTAL KNEE ARTHROPLASTY;  Surgeon: Carole Civil, MD;  Location: [facility];  Service: Orthopedics;  Laterality: Left;  DePuy   . TUBAL LIGATION      There were no vitals filed for this visit.  Subjective Assessment - 11/29/17 1306    Subjective  Patient reported that she is having a rough morning with her knee pain. She stated she has been doing her exercises.     How long can you sit comfortably?  30 minutes    How long can you stand comfortably?  30 minutes    How long can you walk comfortably?  15 minutes    Currently in Pain?  Yes    Pain Score  5      Pain Location  Knee    Pain Orientation  Left    Pain Descriptors / Indicators  Aching    Pain Type  Acute pain         [facility] PT Assessment - 11/29/17 0001      Assessment   Medical Diagnosis  L TKA    Referring Provider  Arther Abbott, MD    Onset Date/Surgical Date  10/17/17    Next MD Visit  12/11/17    Prior Therapy  HHPT, d/c on 11/03/17      Prior Function   Level of Independence  Independent      Cognition   Overall Cognitive Status  Within Functional Limits for tasks assessed      Observation/Other Assessments   Observations  Noted wound near incision which was dried. No increased termperature or redness noted around incision.     Focus on Therapeutic Outcomes (FOTO)   44% (56% limited)      AROM   Left Knee Extension  3    Left Knee Flexion  120      Strength   Right Hip Flexion  5/5 was 5/5    Right Hip Extension  4+/5 was 4/5    Right Hip ABduction  4+/5 was 4/5    Left Hip Flexion  4+/5 was 4+/5    Left Hip Extension  4+/5 was 4-/5    Left Hip ABduction  4+/5 was 4/5    Right Knee Flexion  5/5 was 5/5    Right Knee Extension  5/5 was 5/5    Left Knee Flexion  4+/5 through available range. was 4+/5    Left Knee Extension  4+/5 through available range. was 3+/5    Right Ankle Dorsiflexion  5/5 was 5/5    Left Ankle Dorsiflexion  5/5 was 4+/5      Palpation   Patella mobility  hypomobile all directions    Palpation comment  palpable knots in distal L quad, mild scar hypomobility      Ambulation/Gait   Ambulation Distance (Feet)  434 Feet    Assistive device  Straight cane    Gait Pattern  Step-through pattern;Decreased stance time - left;Decreased hip/knee flexion - left;Decreased dorsiflexion - left;Left foot flat;Antalgic      Static Standing Balance   Static Standing - Balance Support  No upper extremity supported    Static Standing Balance -  Activities   Single Leg Stance - Right Leg;Single Leg Stance - Left Leg    Static Standing - Comment/# of  Minutes  Right: 42.27 seconds, Left: 36.08 seconds      Standardized Balance Assessment   Five times sit to stand comments   13.14 seconds from standard height chair  [facility] PT Treatment/Exercise - 11/29/17 0001      Knee/Hip Exercises: Standing   Forward Lunges  Both;Limitations;15 reps    Forward Lunges Limitations  4" step no UE's    Forward Step Up  Left;Step Height: 4";Hand Hold: 0;15 reps      Knee/Hip Exercises: Supine   Heel Slides  Left;10 reps;Other (comment) 5 second holds    Straight Leg Raises  Left;10 reps;Limitations    Straight Leg Raises Limitations  with 10 degree extension lag             PT Education - 11/29/17 1343    Education Details  Discussed re-assessment findings.     Person(s) Educated  Patient    Methods  Explanation    Comprehension  Verbalized understanding       PT Short Term Goals - 11/29/17 1347      PT SHORT TERM GOAL #1   Title  Pt will be independent with HEP and perform consistently in order to decrease pain and maximize ROM.    Baseline  11/29/17: Patient reported regular compliance with HEP.     Time  3    Period  Weeks    Status  Achieved      PT SHORT TERM GOAL #2   Title  Pt will have improved L knee AROM from 5-120 deg to maximize gait.    Baseline  11/29/17: Patient's left knee AROM ranged from 3-130 degrees.     Time  3    Period  Weeks    Status  Achieved      PT SHORT TERM GOAL #3   Title  Pt will have decreased L knee joint line edema by 2cm or > in order to maximize ROM and decrease pain.    Time  3    Period  Weeks    Status  Deferred      PT SHORT TERM GOAL #4   Title  Pt will have 1/2 grade improvement in MMT throughout in order to maximize gait and functional mobility.    Baseline  11/29/17: Patient achieved in some, but not all muscle groups see MMT.     Time  3    Period  Weeks    Status  Partially Met        PT Long Term Goals - 11/29/17 1349      PT LONG TERM GOAL  #1   Title  Pt will have improved L knee AROM from 0-125deg in order to further maximize gait as well as sitting tolerance and stair ambulation.    Baseline  11/29/17: Patient's left knee AROM is from 3-120 degrees    Time  6    Period  Weeks    Status  On-going      PT LONG TERM GOAL #2   Title  Pt will be able to perform 5xSTS in 10 sec or < with no UE and mechanics WFL to demo improved balance and functional strength.    Baseline  11/29/17: Patient performed in 13.14 seconds.     Time  6    Period  Weeks    Status  On-going      PT LONG TERM GOAL #3   Title  Pt will be able to perform L SLS for 30 sec or > to demo improved balance and functional strength in order to maximize gait and stair ambulation.    Baseline  11/29/17: Patient performed SLS on the left for more than  30 seconds    Time  6    Period  Weeks    Status  Achieved      PT LONG TERM GOAL #4   Title  Pt will be able to ambulate 680f during 3MWT with LRAD and gait WFL in order to maximize return to PLOF.    Baseline  11/29/17: Patient ambulated 434 feet on 3MWT with straight cane.     Time  6    Period  Weeks    Status  On-going      PT LONG TERM GOAL #5   Title  Pt will have 1 grade improvement throughout BLE MMT in order to further maximize her gait and balance and overall retrun to PLOF.    Baseline  11/29/17: Patient achieved in some, but not all muscle groups see MMT.     Time  6    Period  Weeks    Status  On-going            Plan - 11/29/17 1355    Clinical Impression Statement  This session performed a re-assessment of patient's progress towards goals. Patient achieved 2 out of 4 of her short term goals. Patient achieved 1 out of 5 of her long term goals. Patient is still limited in ROM, strength and functional mobility. The remainder of the session focused on exercises to improve patient's left knee AROM and functional strengthening. Did note a wound near incision, which patient stated looks better than  before and that her physician is aware of it. Patient would benefit from continued skilled physical therapy in order to continue progress towards functional goals.     Rehab Potential  Good    PT Frequency  3x / week    PT Duration  6 weeks    PT Treatment/Interventions  ADLs/Self Care Home Management;Aquatic Therapy;Biofeedback;Cryotherapy;Electrical Stimulation;Ultrasound;DME Instruction;Gait training;Stair training;Functional mobility training;Therapeutic activities;Therapeutic exercise;Balance training;Neuromuscular re-education;Patient/family education;Manual techniques;Scar mobilization;Passive range of motion;Dry needling;Taping    PT Next Visit Plan  continue to focus on decreasing edema and quad strengthening.    PT Home Exercise Plan  eval: quad sets, heel slides, supine hamstring stretch; 7/26: TKE GTB, SAQ, standing knee flexion    Consulted and Agree with Plan of Care  Patient       Patient will benefit from skilled therapeutic intervention in order to improve the following deficits and impairments:  Decreased activity tolerance, Decreased balance, Decreased endurance, Decreased range of motion, Decreased strength, Decreased scar mobility, Difficulty walking, Hypomobility, Increased edema, Increased muscle spasms, Increased fascial restricitons, Impaired flexibility, Improper body mechanics, Pain  Visit Diagnosis: Stiffness of left knee, not elsewhere classified  Muscle weakness (generalized)  Difficulty in walking, not elsewhere classified  Acute pain of left knee     Problem List Patient Active Problem List   Diagnosis Date Noted  . 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 Chondral defect of condyle of right femur   . S/P left knee arthroscopy 04/27/17 03/28/2017  . 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 Abdominal pain 02/17/2015  . Nausea without vomiting 02/17/2015  . Diverticulitis of intestine with  abscess 01/08/2015  . Acute diverticulitis 01/08/2015  . Nausea 12/31/2014  . Diverticulitis 12/31/2014  . Hyperglycemia 12/31/2014  . Medication reaction 12/31/2014  . Diverticulitis of large intestine without perforation or abscess without bleeding   . Thrush, oral   . Fatty liver 03/11/2014  . Family hx  of colon cancer requiring screening colonoscopy 03/11/2014  . Rt flank pain 03/11/2014  . Ulnar nerve compression 06/14/2011  . 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 LOW BACK PAIN 11/07/2007  . SCIATICA 11/07/2007  . LUMBAR SPASM 11/07/2007   [name] PT, DPT 2:03 PM, 11/29/17 [place], [place], [address] Phone: [phone]   Fax:  [phone]  Name: [name] MRN: [id number] Date of Birth: [birth year]

## 2017-11-29 NOTE — Telephone Encounter (Signed)
Dr. Ruthe Mannan patient  Oxycodone-Acetaminophen  10/325 MG  Qty  42 Tablets  Take 1 tablet by mouth every 4 (hours) as needed for pain.  PATIENT USES [facility]

## 2017-11-29 NOTE — Telephone Encounter (Signed)
Dr. Ruthe Mannan patient 2nd Medication request  Phenergan 12.5 MG  Qty 30 Tablets  Take 1 tableet (12.5 mg total) by mouth every 6 (six) hours as needed for nausea or vomiting.  PATIENT USES [facility]

## 2017-12-01 ENCOUNTER — Ambulatory Visit (HOSPITAL_COMMUNITY): Payer: Managed Care, Other (non HMO)

## 2017-12-01 ENCOUNTER — Encounter (HOSPITAL_COMMUNITY): Payer: Self-pay

## 2017-12-01 ENCOUNTER — Other Ambulatory Visit: Payer: Self-pay

## 2017-12-01 DIAGNOSIS — M25662 Stiffness of left knee, not elsewhere classified: Secondary | ICD-10-CM

## 2017-12-01 DIAGNOSIS — M25562 Pain in left knee: Secondary | ICD-10-CM

## 2017-12-01 DIAGNOSIS — M6281 Muscle weakness (generalized): Secondary | ICD-10-CM

## 2017-12-01 DIAGNOSIS — R262 Difficulty in walking, not elsewhere classified: Secondary | ICD-10-CM

## 2017-12-01 NOTE — Therapy (Signed)
[place], [place], [address] Phone: [phone]   Fax:  [phone]  Physical Therapy Treatment  Patient Details  Name: [name] MRN: [id number] Date of Birth: 11/26/1970 Referring Provider: Arther Abbott, MD   Encounter Date: 12/01/2017  PT End of Session - 12/01/17 1309    Visit Number  9    Number of Visits  19    Date for PT Re-Evaluation  12/19/17   mini reassessment completed 11/29/17   Authorization Type  [employer] Man[employer] (60 DAY limit for coverage, NOT 60 VISITS -- 10/21/17 to 11/2817 is her 37 DAY LIMIT, hard max, visits cannot be scheduled after 12/20/17)   updated on 11/17/17 as this clinic determined that her 60day coverage began when she started HHPT   Authorization Time Period  11/07/17 to 12/19/17     Authorization - Visit Number  23   # of days since eval, not visit count   Authorization - Number of Visits  60   60 days from initial HHPT eval: 10/21/17 to 12/20/17 is her 60 day limit   PT Start Time  1304    PT Stop Time  1343    PT Time Calculation (min)  39 min    Activity Tolerance  Patient tolerated treatment well    Behavior During Therapy  [facility] for tasks assessed/performed       Past Medical History:  Diagnosis Date  . Arthritis   . Carpal tunnel syndrome, bilateral   . Diabetes mellitus without complication ([place])    no meds; diet controlled  . Diverticulitis   . Hypertension   . PONV (postoperative nausea and vomiting)   . Sciatica     Past Surgical History:  Procedure Laterality Date  . ablasion of uterus    . CARPAL TUNNEL RELEASE  11/10/2010   Procedure: CARPAL TUNNEL RELEASE;  Surgeon: Arther Abbott, MD;  Location: [facility];  Service: Orthopedics;  Laterality: Right;  . CHOLECYSTECTOMY     [facility]  . COLON RESECTION N/A 04/08/2015   Procedure: LAPAROSCOPIC HAND ASSISTED PARTIAL COLECTOMY  CONVERTED TO OPEN AT 7341;  Surgeon: Aviva Signs, MD;  Location: [facility];  Service: General;   Laterality: N/A;  . COLONOSCOPY N/A 03/28/2014   Procedure: COLONOSCOPY;  Surgeon: Rogene Houston, MD;  [facility];  Service: Endoscopy;  Laterality: N/A;  730  . KNEE ARTHROSCOPY WITH DRILLING/MICROFRACTURE Left 10/20/2016   Procedure: LEFT KNEE ARTHROSCOPY WITH MICROFRACTURE;  Surgeon: Carole Civil, MD;  Location: [facility];  Service: Orthopedics;  Laterality: Left;  . KNEE ARTHROSCOPY WITH DRILLING/MICROFRACTURE Left 04/27/2017   Procedure: KNEE ARTHROSCOPY WITH DRILLING/MICROFRACTURE;  Surgeon: Carole Civil, MD;  Location: [facility];  Service: Orthopedics;  Laterality: Left;  . KNEE ARTHROSCOPY WITH OSTEOCHONDRAL DEFECT REPAIR Left 04/27/2017   Procedure: KNEE ARTHROSCOPY WITH OSTEOCHONDRAL DEFECT REPAIR Autograft;  Surgeon: Carole Civil, MD;  Location: [facility];  Service: Orthopedics;  Laterality: Left;  . KNEE SURGERY    . PARTIAL COLECTOMY N/A 04/08/2015   Procedure: PARTIAL COLECTOMY;  Surgeon: Aviva Signs, MD;  Location: [facility];  Service: General;  Laterality: N/A;  . right knee arthroscopy    . TOTAL KNEE ARTHROPLASTY Left 10/17/2017   Procedure: TOTAL KNEE ARTHROPLASTY;  Surgeon: Carole Civil, MD;  Location: [facility];  Service: Orthopedics;  Laterality: Left;  DePuy   . TUBAL LIGATION      There were no vitals filed for this visit.  Subjective  Assessment - 12/01/17 1306    Subjective  Patient reports her knee flees alright today but hurt badly this morning when she woke up. She states her pain medicine helped to relieve it.     Limitations  Walking;House hold activities    How long can you sit comfortably?  30 minutes    How long can you stand comfortably?  30 minutes    How long can you walk comfortably?  15 minutes    Patient Stated Goals  be able to function    Currently in Pain?  Yes    Pain Score  1     Pain Orientation  Left    Pain Descriptors / Indicators  Aching    Pain Type  Surgical pain    Pain Onset  1 to 4 weeks ago    Pain Frequency   Constant    Aggravating Factors   walking    Pain Relieving Factors  ice, medicine    Effect of Pain on Daily Activities  limited with walking        [facility] Adult PT Treatment/Exercise - 12/01/17 0001      Exercises   Exercises  Knee/Hip      Knee/Hip Exercises: Stretches   Active Hamstring Stretch  Left;3 reps;30 seconds;Limitations    Active Hamstring Stretch Limitations  12" step standing    Knee: Self-Stretch to increase Flexion  Left;Limitations;3 reps;30 seconds    Knee: Self-Stretch Limitations  12" box      Knee/Hip Exercises: Aerobic   Stationary Bike  5 minutes on bike for full revolutions for AROM      Knee/Hip Exercises: Standing   Heel Raises  Both;20 reps;3 seconds   on incline   Heel Raises Limitations  toe raises, 20 reps on decline    Forward Lunges  Both;Limitations;15 reps    Forward Lunges Limitations  4" step    Terminal Knee Extension  Left;2 sets;15 reps    Theraband Level (Terminal Knee Extension)  Level 4 (Blue)    Terminal Knee Extension Limitations  3 sec holds      Manual Therapy   Manual Therapy  Edema management    Manual therapy comments  done seperate from all other aspects of treatment    Edema Management  retro massage with BLE elevated to decrease edema and pain        PT Education - 12/01/17 1350    Education Details  educated on cross friction scar massage tor educe adhesions to improve motion and decrease pain. Educated on updated HEP with heel raises to help reduce swelling.     Person(s) Educated  Patient    Methods  Explanation;Handout    Comprehension  Verbalized understanding       PT Short Term Goals - 11/29/17 1347      PT SHORT TERM GOAL #1   Title  Pt will be independent with HEP and perform consistently in order to decrease pain and maximize ROM.    Baseline  11/29/17: Patient reported regular compliance with HEP.     Time  3    Period  Weeks    Status  Achieved      PT SHORT TERM GOAL #2   Title  Pt will have  improved L knee AROM from 5-120 deg to maximize gait.    Baseline  11/29/17: Patient's left knee AROM ranged from 3-130 degrees.     Time  3    Period  Weeks  Status  Achieved      PT SHORT TERM GOAL #3   Title  Pt will have decreased L knee joint line edema by 2cm or > in order to maximize ROM and decrease pain.    Time  3    Period  Weeks    Status  Deferred      PT SHORT TERM GOAL #4   Title  Pt will have 1/2 grade improvement in MMT throughout in order to maximize gait and functional mobility.    Baseline  11/29/17: Patient achieved in some, but not all muscle groups see MMT.     Time  3    Period  Weeks    Status  Partially Met        PT Long Term Goals - 11/29/17 1349      PT LONG TERM GOAL #1   Title  Pt will have improved L knee AROM from 0-125deg in order to further maximize gait as well as sitting tolerance and stair ambulation.    Baseline  11/29/17: Patient's left knee AROM is from 3-120 degrees    Time  6    Period  Weeks    Status  On-going      PT LONG TERM GOAL #2   Title  Pt will be able to perform 5xSTS in 10 sec or < with no UE and mechanics WFL to demo improved balance and functional strength.    Baseline  11/29/17: Patient performed in 13.14 seconds.     Time  6    Period  Weeks    Status  On-going      PT LONG TERM GOAL #3   Title  Pt will be able to perform L SLS for 30 sec or > to demo improved balance and functional strength in order to maximize gait and stair ambulation.    Baseline  11/29/17: Patient performed SLS on the left for more than 30 seconds    Time  6    Period  Weeks    Status  Achieved      PT LONG TERM GOAL #4   Title  Pt will be able to ambulate 667f during 3MWT with LRAD and gait WFL in order to maximize return to PLOF.    Baseline  11/29/17: Patient ambulated 434 feet on 3MWT with straight cane.     Time  6    Period  Weeks    Status  On-going      PT LONG TERM GOAL #5   Title  Pt will have 1 grade improvement throughout BLE MMT  in order to further maximize her gait and balance and overall retrun to PLOF.    Baseline  11/29/17: Patient achieved in some, but not all muscle groups see MMT.     Time  6    Period  Weeks    Status  On-going       Plan - 12/01/17 1309    Clinical Impression Statement  Continued with established POC this session and focused on AROM for LT knee and quad strengthening. Continued with TKE and progressed to blue theraband for greater resistance. Lunges were continued and patient has greatest difficulty with eccentric strengthening with Lt foot back for lunges. This session she appeared to have increased edema and retrograde massage performed to reduce edema and pain. She was educated on scar massage to reduce adhesions to improve ROM and reduce pain further. She will continue to benefit from skilled PT interventions to  address impairments and progress functional mobility.     Rehab Potential  Good    PT Frequency  3x / week    PT Duration  6 weeks    PT Treatment/Interventions  ADLs/Self Care Home Management;Aquatic Therapy;Biofeedback;Cryotherapy;Electrical Stimulation;Ultrasound;DME Instruction;Gait training;Stair training;Functional mobility training;Therapeutic activities;Therapeutic exercise;Balance training;Neuromuscular re-education;Patient/family education;Manual techniques;Scar mobilization;Passive range of motion;Dry needling;Taping    PT Next Visit Plan  Focus on quad strengthening and perform retrograde massage for edema managemetn and scar massage to reduce adhesions.     PT Home Exercise Plan  eval: quad sets, heel slides, supine hamstring stretch; 7/26: TKE GTB, SAQ, standing knee flexion; 12/01/17 - heel raises    Consulted and Agree with Plan of Care  Patient       Patient will benefit from skilled therapeutic intervention in order to improve the following deficits and impairments:  Decreased activity tolerance, Decreased balance, Decreased endurance, Decreased range of motion,  Decreased strength, Decreased scar mobility, Difficulty walking, Hypomobility, Increased edema, Increased muscle spasms, Increased fascial restricitons, Impaired flexibility, Improper body mechanics, Pain  Visit Diagnosis: Stiffness of left knee, not elsewhere classified  Muscle weakness (generalized)  Difficulty in walking, not elsewhere classified  Acute pain of left knee     Problem List Patient Active Problem List   Diagnosis Date Noted  . 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 Chondral defect of condyle of right femur   . S/P left knee arthroscopy 04/27/17 03/28/2017  . 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 Abdominal pain 02/17/2015  . Nausea without vomiting 02/17/2015  . Diverticulitis of intestine with abscess 01/08/2015  . Acute diverticulitis 01/08/2015  . Nausea 12/31/2014  . Diverticulitis 12/31/2014  . Hyperglycemia 12/31/2014  . Medication reaction 12/31/2014  . Diverticulitis of large intestine without perforation or abscess without bleeding   . Thrush, oral   . Fatty liver 03/11/2014  . Family hx of colon cancer requiring screening colonoscopy 03/11/2014  . Rt flank pain 03/11/2014  . Ulnar nerve compression 06/14/2011  . 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 LOW BACK PAIN 11/07/2007  . SCIATICA 11/07/2007  . LUMBAR SPASM 11/07/2007     Kipp Brood, PT, DPT Physical Therapist with [facility]  12/01/2017 1:52 PM    [facility] [address] [place], [place], [address] Phone: [phone]   Fax:  [phone]  Name: [name] MRN: [id number] Date of Birth: 05/15/1970

## 2017-12-04 ENCOUNTER — Encounter (HOSPITAL_COMMUNITY): Payer: Self-pay

## 2017-12-04 ENCOUNTER — Ambulatory Visit (HOSPITAL_COMMUNITY): Payer: Managed Care, Other (non HMO)

## 2017-12-04 DIAGNOSIS — M6281 Muscle weakness (generalized): Secondary | ICD-10-CM

## 2017-12-04 DIAGNOSIS — R262 Difficulty in walking, not elsewhere classified: Secondary | ICD-10-CM

## 2017-12-04 DIAGNOSIS — M25562 Pain in left knee: Secondary | ICD-10-CM

## 2017-12-04 DIAGNOSIS — M25662 Stiffness of left knee, not elsewhere classified: Secondary | ICD-10-CM

## 2017-12-04 NOTE — Therapy (Signed)
[place], [place], [address] Phone: [phone]   Fax:  [phone]  Physical Therapy Treatment  Patient Details  Name: [name] MRN: [id number] Date of Birth: [birth year] Referring Provider: Arther Abbott, MD   Encounter Date: 12/04/2017  PT End of Session - 12/04/17 1300    Visit Number  10    Number of Visits  19    Date for PT Re-Evaluation  12/19/17   mini reassessment completed 11/29/17   Authorization Type  [employer] (60 DAY limit for coverage, NOT 60 VISITS -- 10/21/17 to 11/2817 is her 68 DAY LIMIT, hard max, visits cannot be scheduled after 12/20/17)   updated on 11/17/17 as this clinic determined that her 60day coverage began when she started HHPT   Authorization Time Period  11/07/17 to 12/19/17     Authorization - Visit Number  24   # of days since eval, not visit count   Authorization - Number of Visits  60   60 days from initial HHPT eval: 10/21/17 to 12/20/17 is her 60 day limit   PT Start Time  1300    PT Stop Time  1339    PT Time Calculation (min)  39 min    Activity Tolerance  Patient tolerated treatment well    Behavior During Therapy  [facility] for tasks assessed/performed       Past Medical History:  Diagnosis Date  . Arthritis   . Carpal tunnel syndrome, bilateral   . Diabetes mellitus without complication ([place])    no meds; diet controlled  . Diverticulitis   . Hypertension   . PONV (postoperative nausea and vomiting)   . Sciatica     Past Surgical History:  Procedure Laterality Date  . ablasion of uterus    . CARPAL TUNNEL RELEASE  11/10/2010   Procedure: CARPAL TUNNEL RELEASE;  Surgeon: Arther Abbott, MD;  Location: [facility];  Service: Orthopedics;  Laterality: Right;  . CHOLECYSTECTOMY     [facility]  . COLON RESECTION N/A 04/08/2015   Procedure: LAPAROSCOPIC HAND ASSISTED PARTIAL COLECTOMY  CONVERTED TO OPEN AT 3013;  Surgeon: Aviva Signs, MD;  Location: [facility];  Service:  General;  Laterality: N/A;  . COLONOSCOPY N/A 03/28/2014   Procedure: COLONOSCOPY;  Surgeon: Rogene [place], MD;  Location: [facility];  Service: Endoscopy;  Laterality: N/A;  730  . KNEE ARTHROSCOPY WITH DRILLING/MICROFRACTURE Left 10/20/2016   Procedure: LEFT KNEE ARTHROSCOPY WITH MICROFRACTURE;  Surgeon: Carole Civil, MD;  Location: [facility];  Service: Orthopedics;  Laterality: Left;  . KNEE ARTHROSCOPY WITH DRILLING/MICROFRACTURE Left 04/27/2017   Procedure: KNEE ARTHROSCOPY WITH DRILLING/MICROFRACTURE;  Surgeon: Carole Civil, MD;  Location: [facility];  Service: Orthopedics;  Laterality: Left;  . KNEE ARTHROSCOPY WITH OSTEOCHONDRAL DEFECT REPAIR Left 04/27/2017   Procedure: KNEE ARTHROSCOPY WITH OSTEOCHONDRAL DEFECT REPAIR Autograft;  Surgeon: Carole Civil, MD;  Location: [facility];  Service: Orthopedics;  Laterality: Left;  . KNEE SURGERY    . PARTIAL COLECTOMY N/A 04/08/2015   Procedure: PARTIAL COLECTOMY;  Surgeon: Aviva Signs, MD;  Location: [facility];  Service: General;  Laterality: N/A;  . right knee arthroscopy    . TOTAL KNEE ARTHROPLASTY Left 10/17/2017   Procedure: TOTAL KNEE ARTHROPLASTY;  Surgeon: Carole Civil, MD;  Location: [facility];  Service: Orthopedics;  Laterality: Left;  DePuy   . TUBAL LIGATION      There were no vitals filed for this visit.  Subjective  Assessment - 12/04/17 1300    Subjective  Pt reports that she is feeling rough as usual.     Limitations  Walking;House hold activities    How long can you sit comfortably?  30 minutes    How long can you stand comfortably?  30 minutes    How long can you walk comfortably?  15 minutes    Patient Stated Goals  be able to function    Currently in Pain?  Yes    Pain Score  5     Pain Location  Knee    Pain Orientation  Left    Pain Descriptors / Indicators  Aching    Pain Type  Surgical pain    Pain Onset  1 to 4 weeks ago    Pain Frequency  Constant    Aggravating Factors   walking    Pain  Relieving Factors  ice, medicine    Effect of Pain on Daily Activities  limited with walking              [facility] Adult PT Treatment/Exercise - 12/04/17 0001      Exercises   Exercises  Knee/Hip      Knee/Hip Exercises: Stretches   Active Hamstring Stretch  Left;3 reps;30 seconds;Limitations    Active Hamstring Stretch Limitations  12" step standing    Gastroc Stretch  Both;3 reps;30 seconds;Limitations    Gastroc Stretch Limitations  slant board      Knee/Hip Exercises: Aerobic   Stationary Bike  x4 mins, seat 9, full revolutions for AROM      Knee/Hip Exercises: Machines for Strengthening   Cybex Knee Extension  multigym: 1 plate, BLE, x10 reps +min A from therapist for assist      Knee/Hip Exercises: Standing   Terminal Knee Extension  Left;20 reps    Theraband Level (Terminal Knee Extension)  Level 4 (Blue)    Terminal Knee Extension Limitations  10" holds    Functional Squat  2 sets;10 reps    Functional Squat Limitations  BUE support on // bars    Other Standing Knee Exercises  L heel taps on 2" step 2x10 reps      Knee/Hip Exercises: Supine   Knee Extension Limitations  3    Knee Flexion Limitations  120      Manual Therapy   Manual Therapy  Edema management;Soft tissue mobilization    Manual therapy comments  done seperate from all other aspects of treatment    Edema Management  retro massage with BLE elevated to decrease edema and pain    Soft tissue mobilization  to decrease restrictions at VLO and VMO             PT Education - 12/04/17 1303    Education Details  exercise technique, continue HEP    Person(s) Educated  Patient    Methods  Explanation;Demonstration    Comprehension  Returned demonstration;Verbalized understanding       PT Short Term Goals - 11/29/17 1347      PT SHORT TERM GOAL #1   Title  Pt will be independent with HEP and perform consistently in order to decrease pain and maximize ROM.    Baseline  11/29/17: Patient reported  regular compliance with HEP.     Time  3    Period  Weeks    Status  Achieved      PT SHORT TERM GOAL #2   Title  Pt will have improved L knee AROM  from 5-120 deg to maximize gait.    Baseline  11/29/17: Patient's left knee AROM ranged from 3-130 degrees.     Time  3    Period  Weeks    Status  Achieved      PT SHORT TERM GOAL #3   Title  Pt will have decreased L knee joint line edema by 2cm or > in order to maximize ROM and decrease pain.    Time  3    Period  Weeks    Status  Deferred      PT SHORT TERM GOAL #4   Title  Pt will have 1/2 grade improvement in MMT throughout in order to maximize gait and functional mobility.    Baseline  11/29/17: Patient achieved in some, but not all muscle groups see MMT.     Time  3    Period  Weeks    Status  Partially Met        PT Long Term Goals - 11/29/17 1349      PT LONG TERM GOAL #1   Title  Pt will have improved L knee AROM from 0-125deg in order to further maximize gait as well as sitting tolerance and stair ambulation.    Baseline  11/29/17: Patient's left knee AROM is from 3-120 degrees    Time  6    Period  Weeks    Status  On-going      PT LONG TERM GOAL #2   Title  Pt will be able to perform 5xSTS in 10 sec or < with no UE and mechanics WFL to demo improved balance and functional strength.    Baseline  11/29/17: Patient performed in 13.14 seconds.     Time  6    Period  Weeks    Status  On-going      PT LONG TERM GOAL #3   Title  Pt will be able to perform L SLS for 30 sec or > to demo improved balance and functional strength in order to maximize gait and stair ambulation.    Baseline  11/29/17: Patient performed SLS on the left for more than 30 seconds    Time  6    Period  Weeks    Status  Achieved      PT LONG TERM GOAL #4   Title  Pt will be able to ambulate 665f during 3MWT with LRAD and gait WFL in order to maximize return to PLOF.    Baseline  11/29/17: Patient ambulated 434 feet on 3MWT with straight cane.     Time   6    Period  Weeks    Status  On-going      PT LONG TERM GOAL #5   Title  Pt will have 1 grade improvement throughout BLE MMT in order to further maximize her gait and balance and overall retrun to PLOF.    Baseline  11/29/17: Patient achieved in some, but not all muscle groups see MMT.     Time  6    Period  Weeks    Status  On-going            Plan - 12/04/17 1338    Clinical Impression Statement  Pt still presenting with high pain scale of L knee. Continued with established POC focusing on improving end range extension ROM and improving overall strength. Continued with TKE with BTB and added heel taps and knee extension on multi gym to further improve L quad strength.  Pt very challenged with knee extension machine and required min A from therapist to obtain full extension ROM. Also added hip strengthening this date since her AROM has started to normalize. Ended with manual to address edema and restrictions in L knee. AROM 3 to 120deg this date.     Rehab Potential  Good    PT Frequency  3x / week    PT Duration  6 weeks    PT Treatment/Interventions  ADLs/Self Care Home Management;Aquatic Therapy;Biofeedback;Cryotherapy;Electrical Stimulation;Ultrasound;DME Instruction;Gait training;Stair training;Functional mobility training;Therapeutic activities;Therapeutic exercise;Balance training;Neuromuscular re-education;Patient/family education;Manual techniques;Scar mobilization;Passive range of motion;Dry needling;Taping    PT Next Visit Plan  Focus on quad strengthening and perform retrograde massage for edema managemetn and scar massage to reduce adhesions. assess response to increased functional strengthening and progress as able.    PT Home Exercise Plan  eval: quad sets, heel slides, supine hamstring stretch; 7/26: TKE GTB, SAQ, standing knee flexion; 12/01/17 - heel raises    Consulted and Agree with Plan of Care  Patient       Patient will benefit from skilled therapeutic intervention  in order to improve the following deficits and impairments:  Decreased activity tolerance, Decreased balance, Decreased endurance, Decreased range of motion, Decreased strength, Decreased scar mobility, Difficulty walking, Hypomobility, Increased edema, Increased muscle spasms, Increased fascial restricitons, Impaired flexibility, Improper body mechanics, Pain  Visit Diagnosis: Stiffness of left knee, not elsewhere classified  Muscle weakness (generalized)  Difficulty in walking, not elsewhere classified  Acute pain of left knee     Problem List Patient Active Problem List   Diagnosis Date Noted  . 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 Chondral defect of condyle of right femur   . S/P left knee arthroscopy 04/27/17 03/28/2017  . 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 Abdominal pain 02/17/2015  . Nausea without vomiting 02/17/2015  . Diverticulitis of intestine with abscess 01/08/2015  . Acute diverticulitis 01/08/2015  . Nausea 12/31/2014  . Diverticulitis 12/31/2014  . Hyperglycemia 12/31/2014  . Medication reaction 12/31/2014  . Diverticulitis of large intestine without perforation or abscess without bleeding   . Thrush, oral   . Fatty liver 03/11/2014  . Family hx of colon cancer requiring screening colonoscopy 03/11/2014  . Rt flank pain 03/11/2014  . Ulnar nerve compression 06/14/2011  . 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 LOW BACK PAIN 11/07/2007  . SCIATICA 11/07/2007  . LUMBAR SPASM 11/07/2007      Geraldine Solar PT, DPT  [place] [address] [place], [place], [address] Phone: [phone]   Fax:  [phone]  Name: [name] MRN: [id number] Date of Birth: 06-13-1970

## 2017-12-06 ENCOUNTER — Ambulatory Visit (HOSPITAL_COMMUNITY): Payer: Managed Care, Other (non HMO)

## 2017-12-06 ENCOUNTER — Encounter (HOSPITAL_COMMUNITY): Payer: Self-pay

## 2017-12-06 DIAGNOSIS — M6281 Muscle weakness (generalized): Secondary | ICD-10-CM

## 2017-12-06 DIAGNOSIS — M25662 Stiffness of left knee, not elsewhere classified: Secondary | ICD-10-CM

## 2017-12-06 DIAGNOSIS — R262 Difficulty in walking, not elsewhere classified: Secondary | ICD-10-CM

## 2017-12-06 DIAGNOSIS — M25562 Pain in left knee: Secondary | ICD-10-CM

## 2017-12-06 NOTE — Therapy (Signed)
[facility] [address] [place], [place], [address] Phone: [phone]   Fax:  [phone]  Physical Therapy Treatment  Patient Details  Name: [name] MRN: [id number] Date of Birth: 07/01/1970 Referring Provider: Stanley Harrison, MD   Encounter Date: 12/06/2017  PT End of Session - 12/06/17 1301    Visit Number  11    Number of Visits  19    Date for PT Re-Evaluation  12/19/17   mini reassessment completed 11/29/17   Authorization Type  [employer] Man[employer] (60 DAY limit for coverage, NOT 60 VISITS -- 10/21/17 to 11/2817 is her 60 DAY LIMIT, hard max, visits cannot be scheduled after 12/20/17)   updated on 11/17/17 as this clinic determined that her 60day coverage began when she started HHPT   Authorization Time Period  11/07/17 to 12/19/17     Authorization - Visit Number  24   # of days since eval, not visit count   Authorization - Number of Visits  60   60 days from initial HHPT eval: 10/21/17 to 12/20/17 is her 60 day limit   PT Start Time  1302    PT Stop Time  1342    PT Time Calculation (min)  40 min    Activity Tolerance  Patient tolerated treatment well    Behavior During Therapy  WFL for tasks assessed/performed       Past Medical History:  Diagnosis Date  . Arthritis   . Carpal tunnel syndrome, bilateral   . Diabetes mellitus without complication ([facility])    no meds; diet controlled  . Diverticulitis   . Hypertension   . PONV (postoperative nausea and vomiting)   . Sciatica     Past Surgical History:  Procedure Laterality Date  . ablasion of uterus    . CARPAL TUNNEL RELEASE  11/10/2010   Procedure: CARPAL TUNNEL RELEASE;  Surgeon: Stanley Harrison, MD;  [facility]: [facility];  Service: Orthopedics;  Laterality: Right;  . CHOLECYSTECTOMY     [facility]  . COLON RESECTION N/A 04/08/2015   Procedure: LAPAROSCOPIC HAND ASSISTED PARTIAL COLECTOMY  CONVERTED TO OPEN AT 0947;  Surgeon: Mark Jenkins, MD;  Location: [facility];  Service:  General;  Laterality: N/A;  . COLONOSCOPY N/A 03/28/2014   Procedure: COLONOSCOPY;  Surgeon: Najeeb U Rehman, MD;  [facility];  Service: Endoscopy;  Laterality: N/A;  730  . KNEE ARTHROSCOPY WITH DRILLING/MICROFRACTURE Left 10/20/2016   Procedure: LEFT KNEE ARTHROSCOPY WITH MICROFRACTURE;  Surgeon: Harrison, Stanley E, MD;  Location: [facility];  Service: Orthopedics;  Laterality: Left;  . KNEE ARTHROSCOPY WITH DRILLING/MICROFRACTURE Left 04/27/2017   Procedure: KNEE ARTHROSCOPY WITH DRILLING/MICROFRACTURE;  Surgeon: Harrison, Stanley E, MD;  Location: [facility];  Service: Orthopedics;  Laterality: Left;  . KNEE ARTHROSCOPY WITH OSTEOCHONDRAL DEFECT REPAIR Left 04/27/2017   Procedure: KNEE ARTHROSCOPY WITH OSTEOCHONDRAL DEFECT REPAIR Autograft;  Surgeon: Harrison, Stanley E, MD;  Location: [facility];  Service: Orthopedics;  Laterality: Left;  . KNEE SURGERY    . PARTIAL COLECTOMY N/A 04/08/2015   Procedure: PARTIAL COLECTOMY;  Surgeon: Mark Jenkins, MD;  Location: [facility];  Service: General;  Laterality: N/A;  . right knee arthroscopy    . TOTAL KNEE ARTHROPLASTY Left 10/17/2017   Procedure: TOTAL KNEE ARTHROPLASTY;  Surgeon: Harrison, Stanley E, MD;  Location: [facility];  Service: Orthopedics;  Laterality: Left;  DePuy   . TUBAL LIGATION      There were no vitals filed for this visit.  Subjective   Assessment - 12/06/17 1304    Subjective  Pt states that she is having another tough day. She was pretty sore from the machine exercise introduced last visit.    Limitations  Walking;House hold activities    How long can you sit comfortably?  30 minutes    How long can you stand comfortably?  30 minutes    How long can you walk comfortably?  15 minutes    Patient Stated Goals  be able to function    Currently in Pain?  Yes    Pain Score  5     Pain Location  Knee    Pain Orientation  Left    Pain Descriptors / Indicators  Aching    Pain Type  Surgical pain    Pain Onset  More than a month ago     Pain Frequency  Constant    Aggravating Factors   walking    Pain Relieving Factors  ice, medicine    Effect of Pain on Daily Activities  limited with walking            [facility] Adult PT Treatment/Exercise - 12/06/17 0001      Exercises   Exercises  Knee/Hip      Knee/Hip Exercises: Stretches   Active Hamstring Stretch  Left;3 reps;30 seconds;Limitations    Active Hamstring Stretch Limitations  12" step standing    Quad Stretch  Left;2 reps;30 seconds    Quad Stretch Limitations  prone with rope    Gastroc Stretch  Both;3 reps;30 seconds;Limitations    Gastroc Stretch Limitations  slant board      Knee/Hip Exercises: Aerobic   Stationary Bike  x4 mins, seat 8, full revolutions for AROM      Knee/Hip Exercises: Standing   Terminal Knee Extension  Left;20 reps    Theraband Level (Terminal Knee Extension)  Level 4 (Blue)    Terminal Knee Extension Limitations  10" holds    Hip ADduction  Both;2 sets;10 reps    Hip ADduction Limitations  RTB    Functional Squat  2 sets;10 reps    Functional Squat Limitations  BUE support on // bars    Other Standing Knee Exercises  sidestepping RTB 15ft x2RT      Knee/Hip Exercises: Seated   Long Arc Quad  Left;15 reps    Long Arc Quad Weight  5 lbs.      Knee/Hip Exercises: Supine   Knee Extension Limitations  2    Knee Flexion Limitations  121      Knee/Hip Exercises: Prone   Hamstring Curl  15 reps    Hamstring Curl Limitations  LLE    Other Prone Exercises  TKE with 2# weight for proprioception 15x5" holds      Manual Therapy   Manual Therapy  Edema management;Joint mobilization;Soft tissue mobilization    Manual therapy comments  done seperate from all other aspects of treatment    Edema Management  retro massage with BLE elevated to decrease edema and pain    Joint Mobilization  Grade II-III L PA tibiofemoral joint mobs for improved knee extension and decreaesd pain    Soft tissue mobilization  to decrease restrictions at VLO  and VMO             PT Education - 12/06/17 1304    Education Details  contniue HEP, continue ice for pain    Person(s) Educated  Patient    Methods  Explanation;Demonstration    Comprehension    Verbalized understanding;Returned demonstration       PT Short Term Goals - 11/29/17 1347      PT SHORT TERM GOAL #1   Title  Pt will be independent with HEP and perform consistently in order to decrease pain and maximize ROM.    Baseline  11/29/17: Patient reported regular compliance with HEP.     Time  3    Period  Weeks    Status  Achieved      PT SHORT TERM GOAL #2   Title  Pt will have improved L knee AROM from 5-120 deg to maximize gait.    Baseline  11/29/17: Patient's left knee AROM ranged from 3-130 degrees.     Time  3    Period  Weeks    Status  Achieved      PT SHORT TERM GOAL #3   Title  Pt will have decreased L knee joint line edema by 2cm or > in order to maximize ROM and decrease pain.    Time  3    Period  Weeks    Status  Deferred      PT SHORT TERM GOAL #4   Title  Pt will have 1/2 grade improvement in MMT throughout in order to maximize gait and functional mobility.    Baseline  11/29/17: Patient achieved in some, but not all muscle groups see MMT.     Time  3    Period  Weeks    Status  Partially Met        PT Long Term Goals - 11/29/17 1349      PT LONG TERM GOAL #1   Title  Pt will have improved L knee AROM from 0-125deg in order to further maximize gait as well as sitting tolerance and stair ambulation.    Baseline  11/29/17: Patient's left knee AROM is from 3-120 degrees    Time  6    Period  Weeks    Status  On-going      PT LONG TERM GOAL #2   Title  Pt will be able to perform 5xSTS in 10 sec or < with no UE and mechanics WFL to demo improved balance and functional strength.    Baseline  11/29/17: Patient performed in 13.14 seconds.     Time  6    Period  Weeks    Status  On-going      PT LONG TERM GOAL #3   Title  Pt will be able to perform L  SLS for 30 sec or > to demo improved balance and functional strength in order to maximize gait and stair ambulation.    Baseline  11/29/17: Patient performed SLS on the left for more than 30 seconds    Time  6    Period  Weeks    Status  Achieved      PT LONG TERM GOAL #4   Title  Pt will be able to ambulate 600ft during 3MWT with LRAD and gait WFL in order to maximize return to PLOF.    Baseline  11/29/17: Patient ambulated 434 feet on 3MWT with straight cane.     Time  6    Period  Weeks    Status  On-going      PT LONG TERM GOAL #5   Title  Pt will have 1 grade improvement throughout BLE MMT in order to further maximize her gait and balance and overall retrun to PLOF.    Baseline    11/29/17: Patient achieved in some, but not all muscle groups see MMT.     Time  6    Period  Weeks    Status  On-going            Plan - 12/06/17 1342    Clinical Impression Statement  Continued focusing on last bit of extension ROM lacking and on overall knee and hip strength. Did not perform the mulit-gym knee extension strengthening this date since she had increased soreness from it, but, did have pt perform weighted LAQ for quad strengthening and she reported this as feeling better than the machine. Began prone mobility and strengthening exercises today. Also introduced tibiofemoral joint mobs for pain control and joint mobility. AROM 2 to 121deg this date. Continue as planned, progressing as able.     Rehab Potential  Good    PT Frequency  3x / week    PT Duration  6 weeks    PT Treatment/Interventions  ADLs/Self Care Home Management;Aquatic Therapy;Biofeedback;Cryotherapy;Electrical Stimulation;Ultrasound;DME Instruction;Gait training;Stair training;Functional mobility training;Therapeutic activities;Therapeutic exercise;Balance training;Neuromuscular re-education;Patient/family education;Manual techniques;Scar mobilization;Passive range of motion;Dry needling;Taping    PT Next Visit Plan  Focus on quad  strengthening and perform retrograde massage for edema managemetn and scar massage to reduce adhesions. assess response to increased functional strengthening and progress as able. continue joint mobs    PT Home Exercise Plan  eval: quad sets, heel slides, supine hamstring stretch; 7/26: TKE GTB, SAQ, standing knee flexion; 12/01/17 - heel raises    Consulted and Agree with Plan of Care  Patient       Patient will benefit from skilled therapeutic intervention in order to improve the following deficits and impairments:  Decreased activity tolerance, Decreased balance, Decreased endurance, Decreased range of motion, Decreased strength, Decreased scar mobility, Difficulty walking, Hypomobility, Increased edema, Increased muscle spasms, Increased fascial restricitons, Impaired flexibility, Improper body mechanics, Pain  Visit Diagnosis: Stiffness of left knee, not elsewhere classified  Muscle weakness (generalized)  Difficulty in walking, not elsewhere classified  Acute pain of left knee     Problem List Patient Active Problem List   Diagnosis Date Noted  . 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 Chondral defect of condyle of right femur   . S/P left knee arthroscopy 04/27/17 03/28/2017  . 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 Abdominal pain 02/17/2015  . Nausea without vomiting 02/17/2015  . Diverticulitis of intestine with abscess 01/08/2015  . Acute diverticulitis 01/08/2015  . Nausea 12/31/2014  . Diverticulitis 12/31/2014  . Hyperglycemia 12/31/2014  . Medication reaction 12/31/2014  . Diverticulitis of large intestine without perforation or abscess without bleeding   . Thrush, oral   . Fatty liver 03/11/2014  . Family hx of colon cancer requiring screening colonoscopy 03/11/2014  . Rt flank pain 03/11/2014  . Ulnar nerve compression 06/14/2011  . 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 LOW BACK PAIN  11/07/2007  . SCIATICA 11/07/2007  . LUMBAR SPASM 11/07/2007       Geraldine Solar PT, DPT  [facility] [address] [place], [place], [address] Phone: [phone]   Fax:  [phone]  Name: LATARSHIA JERSEY MRN: [id number] Date of Birth: 03/17/1971

## 2017-12-08 ENCOUNTER — Ambulatory Visit (HOSPITAL_COMMUNITY): Payer: Managed Care, Other (non HMO)

## 2017-12-08 ENCOUNTER — Encounter (HOSPITAL_COMMUNITY): Payer: Self-pay

## 2017-12-08 DIAGNOSIS — M6281 Muscle weakness (generalized): Secondary | ICD-10-CM

## 2017-12-08 DIAGNOSIS — M25662 Stiffness of left knee, not elsewhere classified: Secondary | ICD-10-CM

## 2017-12-08 DIAGNOSIS — M25562 Pain in left knee: Secondary | ICD-10-CM

## 2017-12-08 DIAGNOSIS — R262 Difficulty in walking, not elsewhere classified: Secondary | ICD-10-CM

## 2017-12-08 NOTE — Therapy (Signed)
[place], [place], [address] Phone: [phone]   Fax:  [phone]  Physical Therapy Treatment  Patient Details  Name: [name] MRN: [id number] Date of Birth: 1971/02/09 Referring Provider: Arther Abbott, MD   Encounter Date: 12/08/2017  PT End of Session - 12/08/17 1434    Visit Number  12    Number of Visits  19    Date for PT Re-Evaluation  12/19/17   mini reassessment completed 11/29/17   Authorization Type  [employer] (60 DAY limit for coverage, NOT 60 VISITS -- 10/21/17 to 11/2817 is her 49 DAY LIMIT, hard max, visits cannot be scheduled after 12/20/17)   updated on 11/17/17 as this clinic determined that her 60day coverage began when she started HHPT   Authorization Time Period  11/07/17 to 12/19/17     Authorization - Visit Number  24   # of days since eval, not visit count   Authorization - Number of Visits  60   60 days from initial HHPT eval: 10/21/17 to 12/20/17 is her 60 day limit   PT Start Time  1431    Activity Tolerance  Patient tolerated treatment well    Behavior During Therapy  [facility] for tasks assessed/performed       Past Medical History:  Diagnosis Date  . Arthritis   . Carpal tunnel syndrome, bilateral   . Diabetes mellitus without complication ([place])    no meds; diet controlled  . Diverticulitis   . Hypertension   . PONV (postoperative nausea and vomiting)   . Sciatica     Past Surgical History:  Procedure Laterality Date  . ablasion of uterus    . CARPAL TUNNEL RELEASE  11/10/2010   Procedure: CARPAL TUNNEL RELEASE;  Surgeon: Arther Abbott, MD;  Location: [facility];  Service: Orthopedics;  Laterality: Right;  . CHOLECYSTECTOMY     [facility]  . COLON RESECTION N/A 04/08/2015   Procedure: LAPAROSCOPIC HAND ASSISTED PARTIAL COLECTOMY  CONVERTED TO OPEN AT 7564;  Surgeon: Aviva Signs, MD;  Location: [facility];  Service: General;  Laterality: N/A;  . COLONOSCOPY N/A 03/28/2014   Procedure: COLONOSCOPY;  Surgeon: Rogene Houston, MD;  Location: [facility];  Service: Endoscopy;  Laterality: N/A;  730  . KNEE ARTHROSCOPY WITH DRILLING/MICROFRACTURE Left 10/20/2016   Procedure: LEFT KNEE ARTHROSCOPY WITH MICROFRACTURE;  Surgeon: Carole Civil, MD;  Location: [facility];  Service: Orthopedics;  Laterality: Left;  . KNEE ARTHROSCOPY WITH DRILLING/MICROFRACTURE Left 04/27/2017   Procedure: KNEE ARTHROSCOPY WITH DRILLING/MICROFRACTURE;  Surgeon: Carole Civil, MD;  Location: [facility];  Service: Orthopedics;  Laterality: Left;  . KNEE ARTHROSCOPY WITH OSTEOCHONDRAL DEFECT REPAIR Left 04/27/2017   Procedure: KNEE ARTHROSCOPY WITH OSTEOCHONDRAL DEFECT REPAIR Autograft;  Surgeon: Carole Civil, MD;  Location: [facility];  Service: Orthopedics;  Laterality: Left;  . KNEE SURGERY    . PARTIAL COLECTOMY N/A 04/08/2015   Procedure: PARTIAL COLECTOMY;  Surgeon: Aviva Signs, MD;  Location: [facility];  Service: General;  Laterality: N/A;  . right knee arthroscopy    . TOTAL KNEE ARTHROPLASTY Left 10/17/2017   Procedure: TOTAL KNEE ARTHROPLASTY;  Surgeon: Carole Civil, MD;  Location: [facility];  Service: Orthopedics;  Laterality: Left;  DePuy   . TUBAL LIGATION      There were no vitals filed for this visit.  Subjective Assessment - 12/08/17 1433    Subjective  Pt states that she almost didn't come today due to  high pain scale. She states that she hurt all the way down to her foot yesterday. She thinks it's due to the increase in strengthening in therapy.     Limitations  Walking;House hold activities    How long can you sit comfortably?  30 minutes    How long can you stand comfortably?  30 minutes    How long can you walk comfortably?  15 minutes    Patient Stated Goals  be able to function    Currently in Pain?  Yes    Pain Score  8     Pain Location  Knee    Pain Orientation  Left    Pain Descriptors / Indicators  Throbbing    Pain Type  Surgical pain    Pain Onset   More than a month ago    Pain Frequency  Constant    Aggravating Factors   walking    Pain Relieving Factors  ice, medicine    Effect of Pain on Daily Activities  limited with walking            [facility] Adult PT Treatment/Exercise - 12/08/17 0001      Knee/Hip Exercises: Supine   Knee Extension Limitations  3    Knee Flexion Limitations  116      Modalities   Modalities  Electrical Stimulation      Electrical Stimulation   Electrical Stimulation Location  x8 mins unbilled; L quad and L tibia    Electrical Stimulation Action  TENS    Electrical Stimulation Parameters  to tolerance    Electrical Stimulation Goals  Pain      Manual Therapy   Manual Therapy  Edema management;Soft tissue mobilization    Manual therapy comments  done seperate from all other aspects of treatment    Edema Management  retro massage with BLE elevated to decrease edema and pain    Soft tissue mobilization  light efflurage to decrese pain            PT Short Term Goals - 11/29/17 1347      PT SHORT TERM GOAL #1   Title  Pt will be independent with HEP and perform consistently in order to decrease pain and maximize ROM.    Baseline  11/29/17: Patient reported regular compliance with HEP.     Time  3    Period  Weeks    Status  Achieved      PT SHORT TERM GOAL #2   Title  Pt will have improved L knee AROM from 5-120 deg to maximize gait.    Baseline  11/29/17: Patient's left knee AROM ranged from 3-130 degrees.     Time  3    Period  Weeks    Status  Achieved      PT SHORT TERM GOAL #3   Title  Pt will have decreased L knee joint line edema by 2cm or > in order to maximize ROM and decrease pain.    Time  3    Period  Weeks    Status  Deferred      PT SHORT TERM GOAL #4   Title  Pt will have 1/2 grade improvement in MMT throughout in order to maximize gait and functional mobility.    Baseline  11/29/17: Patient achieved in some, but not all muscle groups see MMT.     Time  3    Period   Weeks    Status  Partially Met  PT Long Term Goals - 11/29/17 1349      PT LONG TERM GOAL #1   Title  Pt will have improved L knee AROM from 0-125deg in order to further maximize gait as well as sitting tolerance and stair ambulation.    Baseline  11/29/17: Patient's left knee AROM is from 3-120 degrees    Time  6    Period  Weeks    Status  On-going      PT LONG TERM GOAL #2   Title  Pt will be able to perform 5xSTS in 10 sec or < with no UE and mechanics WFL to demo improved balance and functional strength.    Baseline  11/29/17: Patient performed in 13.14 seconds.     Time  6    Period  Weeks    Status  On-going      PT LONG TERM GOAL #3   Title  Pt will be able to perform L SLS for 30 sec or > to demo improved balance and functional strength in order to maximize gait and stair ambulation.    Baseline  11/29/17: Patient performed SLS on the left for more than 30 seconds    Time  6    Period  Weeks    Status  Achieved      PT LONG TERM GOAL #4   Title  Pt will be able to ambulate 648f during 3MWT with LRAD and gait WFL in order to maximize return to PLOF.    Baseline  11/29/17: Patient ambulated 434 feet on 3MWT with straight cane.     Time  6    Period  Weeks    Status  On-going      PT LONG TERM GOAL #5   Title  Pt will have 1 grade improvement throughout BLE MMT in order to further maximize her gait and balance and overall retrun to PLOF.    Baseline  11/29/17: Patient achieved in some, but not all muscle groups see MMT.     Time  6    Period  Weeks    Status  On-going            Plan - 12/08/17 1516    Clinical Impression Statement  Pt reporting acute increase in L knee/thigh pain and feels it is due to the increase in strengthening that has been performed in therapy. She states that she barely got out of bed yesterday due to pain. Due to acute increase in pain, PT only performed manual therapy for light STM and edema control. Pt reported decreased pain to 3/10  during manual. Her AROM remained relatively unchanged but was 3-116deg (was 2-121deg Wednesday). Ended with TENS to L knee (1 channel on distal quad and 1 channel on upper tibia; did not cross the channels over the knee joint) to further address L knee pain. Pt reporting 2/10 pain at EOS, was 8/10 at beginning. Do not progress pt's strengthening and potentially regress it to lower level strength activities to minimize pain following.     Rehab Potential  Good    PT Frequency  3x / week    PT Duration  6 weeks    PT Treatment/Interventions  ADLs/Self Care Home Management;Aquatic Therapy;Biofeedback;Cryotherapy;Electrical Stimulation;Ultrasound;DME Instruction;Gait training;Stair training;Functional mobility training;Therapeutic activities;Therapeutic exercise;Balance training;Neuromuscular re-education;Patient/family education;Manual techniques;Scar mobilization;Passive range of motion;Dry needling;Taping    PT Next Visit Plan  Regress/redude strengthening; increase pain management techniques as needed; send Dr. HAline Brochureupdated note as she sees him 8/19; perform  retrograde massage for edema managemetn and scar massage to reduce adhesions. assess response to increased functional strengthening and progress as able. continue joint mobs    PT Home Exercise Plan  eval: quad sets, heel slides, supine hamstring stretch; 7/26: TKE GTB, SAQ, standing knee flexion; 12/01/17 - heel raises    Consulted and Agree with Plan of Care  Patient       Patient will benefit from skilled therapeutic intervention in order to improve the following deficits and impairments:  Decreased activity tolerance, Decreased balance, Decreased endurance, Decreased range of motion, Decreased strength, Decreased scar mobility, Difficulty walking, Hypomobility, Increased edema, Increased muscle spasms, Increased fascial restricitons, Impaired flexibility, Improper body mechanics, Pain  Visit Diagnosis: Stiffness of left knee, not elsewhere  classified  Muscle weakness (generalized)  Difficulty in walking, not elsewhere classified  Acute pain of left knee     Problem List Patient Active Problem List   Diagnosis Date Noted  . 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 Chondral defect of condyle of right femur   . S/P left knee arthroscopy 04/27/17 03/28/2017  . 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 Abdominal pain 02/17/2015  . Nausea without vomiting 02/17/2015  . Diverticulitis of intestine with abscess 01/08/2015  . Acute diverticulitis 01/08/2015  . Nausea 12/31/2014  . Diverticulitis 12/31/2014  . Hyperglycemia 12/31/2014  . Medication reaction 12/31/2014  . Diverticulitis of large intestine without perforation or abscess without bleeding   . Thrush, oral   . Fatty liver 03/11/2014  . Family hx of colon cancer requiring screening colonoscopy 03/11/2014  . Rt flank pain 03/11/2014  . Ulnar nerve compression 06/14/2011  . 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 LOW BACK PAIN 11/07/2007  . SCIATICA 11/07/2007  . LUMBAR SPASM 11/07/2007        Geraldine Solar PT, DPT  [place] [address] [place], [place], [address] Phone: [phone]   Fax:  [phone]  Name: NATARA MONFORT MRN: [id number] Date of Birth: 10/02/1970

## 2017-12-11 ENCOUNTER — Ambulatory Visit (INDEPENDENT_AMBULATORY_CARE_PROVIDER_SITE_OTHER): Payer: Self-pay | Admitting: Orthopedic Surgery

## 2017-12-11 ENCOUNTER — Encounter: Payer: Self-pay | Admitting: Orthopedic Surgery

## 2017-12-11 ENCOUNTER — Ambulatory Visit (HOSPITAL_COMMUNITY): Payer: Self-pay | Admitting: Physical Therapy

## 2017-12-11 VITALS — BP 139/77 | HR 91 | Ht 65.0 in | Wt 202.0 lb

## 2017-12-11 DIAGNOSIS — R262 Difficulty in walking, not elsewhere classified: Secondary | ICD-10-CM

## 2017-12-11 DIAGNOSIS — M25662 Stiffness of left knee, not elsewhere classified: Secondary | ICD-10-CM

## 2017-12-11 DIAGNOSIS — Z96652 Presence of left artificial knee joint: Secondary | ICD-10-CM

## 2017-12-11 DIAGNOSIS — M6281 Muscle weakness (generalized): Secondary | ICD-10-CM

## 2017-12-11 DIAGNOSIS — M25562 Pain in left knee: Secondary | ICD-10-CM

## 2017-12-11 MED ORDER — GABAPENTIN 300 MG PO CAPS
300.0000 mg | ORAL_CAPSULE | Freq: Three times a day (TID) | ORAL | 5 refills | Status: DC
Start: 2017-12-11 — End: 2020-04-20

## 2017-12-11 MED ORDER — OXYCODONE-ACETAMINOPHEN 7.5-325 MG PO TABS: 1.0000 | ORAL_TABLET | Freq: Four times a day (QID) | ORAL | 0 refills | Status: AC | PRN

## 2017-12-11 NOTE — Progress Notes (Signed)
POSTOP VISIT  POD #2 months  Chief Complaint  Patient presents with  . Knee Pain    Left TK 10/17/17    47 years old had arthroscopy failed and had microfracture fail that had oats procedure failed that eventually had right total knee presents for her routine follow-up 2 months after surgery still complaining of a dull throbbing aching pain.  She has progressed to get off of the walker and the cane she still has a limp she is on Percocet 10 mg ibuprofen 800 mg Robaxin 500 mg plus a Tylenol with modest pain relief  She says the pain is getting somewhat better but still has a dull throbbing aching pain which just kind of will not go away  Her knee looks good she can bend it about 110 degrees she has full extension again she is walking with a slight limp.  I do not see any signs of infection  I still feel she has some type of neurogenic pain generator and I am going to add some gabapentin she wants to decrease her Percocet to 7.5 mg which is fine she will continue with ibuprofen as well and follow-up in 4 weeks   Encounter Diagnosis  Name Primary?  . 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 oxyCODONE-acetaminophen (PERCOCET) 7.5-325 MG tablet    Sig: Take 1 tablet by mouth every 6 (six) hours as needed for up to 7 days for severe pain.    Dispense:  28 tablet    Refill:  0  . gabapentin (NEURONTIN) 300 MG capsule    Sig: Take 1 capsule (300 mg total) by mouth 3 (three) times daily.    Dispense:  90 capsule    Refill:  5  ,FU)

## 2017-12-11 NOTE — Therapy (Signed)
[place], [place], [address] Phone: [phone]   Fax:  [phone]  Physical Therapy Treatment  Patient Details  Name: [name] MRN: [id number] Date of Birth: [birth year] Referring Provider: Arther Abbott, MD  # OF FEET WALKED: 434 feet with straight cane in 3 minutes  ROM:  Flexion: 118            Extension: 3  Encounter Date: 12/11/2017  PT End of Session - 12/11/17 1342    Visit Number  13    Number of Visits  19    Date for PT Re-Evaluation  12/19/17   mini reassessment completed 11/29/17   Authorization Type  [employer] Managed (60 DAY limit for coverage, NOT 60 VISITS -- 10/21/17 to 11/2817 is her 65 DAY LIMIT, hard max, visits cannot be scheduled after 12/20/17)   updated on 11/17/17 as this clinic determined that her 60day coverage began when she started HHPT   Authorization Time Period  11/07/17 to 12/19/17     Authorization - Visit Number  25   # of days since eval, not visit count   Authorization - Number of Visits  60   60 days from initial HHPT eval: 10/21/17 to 12/20/17 is her 60 day limit   PT Start Time  1305    PT Stop Time  1351    PT Time Calculation (min)  46 min    Activity Tolerance  Patient tolerated treatment well    Behavior During Therapy  [facility] for tasks assessed/performed       Past Medical History:  Diagnosis Date  . Arthritis   . Carpal tunnel syndrome, bilateral   . Diabetes mellitus without complication ([place])    no meds; diet controlled  . Diverticulitis   . Hypertension   . PONV (postoperative nausea and vomiting)   . Sciatica     Past Surgical History:  Procedure Laterality Date  . ablasion of uterus    . CARPAL TUNNEL RELEASE  11/10/2010   Procedure: CARPAL TUNNEL RELEASE;  Surgeon: Arther Abbott, MD;  Location: [facility];  Service: Orthopedics;  Laterality: Right;  . CHOLECYSTECTOMY     [facility]  . COLON RESECTION N/A 04/08/2015   Procedure: LAPAROSCOPIC HAND ASSISTED  PARTIAL COLECTOMY  CONVERTED TO OPEN AT 8527;  Surgeon: Aviva Signs, MD;  Location: [facility];  Service: General;  Laterality: N/A;  . COLONOSCOPY N/A 03/28/2014   Procedure: COLONOSCOPY;  Surgeon: Rogene Houston, MD;  Location: [facility];  Service: Endoscopy;  Laterality: N/A;  730  . KNEE ARTHROSCOPY WITH DRILLING/MICROFRACTURE Left 10/20/2016   Procedure: LEFT KNEE ARTHROSCOPY WITH MICROFRACTURE;  Surgeon: Carole Civil, MD;  Location: [facility];  Service: Orthopedics;  Laterality: Left;  . KNEE ARTHROSCOPY WITH DRILLING/MICROFRACTURE Left 04/27/2017   Procedure: KNEE ARTHROSCOPY WITH DRILLING/MICROFRACTURE;  Surgeon: Carole Civil, MD;  Location: [facility];  Service: Orthopedics;  Laterality: Left;  . KNEE ARTHROSCOPY WITH OSTEOCHONDRAL DEFECT REPAIR Left 04/27/2017   Procedure: KNEE ARTHROSCOPY WITH OSTEOCHONDRAL DEFECT REPAIR Autograft;  Surgeon: Carole Civil, MD;  Location: [facility];  Service: Orthopedics;  Laterality: Left;  . KNEE SURGERY    . PARTIAL COLECTOMY N/A 04/08/2015   Procedure: PARTIAL COLECTOMY;  Surgeon: Aviva Signs, MD;  Location: [facility];  Service: General;  Laterality: N/A;  . right knee arthroscopy    . TOTAL KNEE ARTHROPLASTY Left 10/17/2017   Procedure: TOTAL KNEE ARTHROPLASTY;  Surgeon: Carole Civil, MD;  Location:  [facility];  Service: Orthopedics;  Laterality: Left;  DePuy   . TUBAL LIGATION      There were no vitals filed for this visit.  Subjective Assessment - 12/11/17 1311    Subjective  Pt reports her pain is still at 5/10.  STates she did a "bare minimum" as far as exercises concerned.  States the pain is better at night when she is sleeping than before.     Currently in Pain?  Yes    Pain Score  5     Pain Location  Knee    Pain Orientation  Left    Pain Descriptors / Indicators  Aching    Pain Type  Surgical pain                       [facility] Adult PT Treatment/Exercise - 12/11/17 0001      Knee/Hip Exercises: Stretches    Knee: Self-Stretch to increase Flexion  Left;Limitations;3 reps;30 seconds    Knee: Self-Stretch Limitations  12" box    Gastroc Stretch  Both;3 reps;30 seconds;Limitations    Gastroc Stretch Limitations  slant board      Knee/Hip Exercises: Standing   Heel Raises  Both;20 reps;3 seconds    Forward Lunges  Both;Limitations;15 reps;2 sets    Forward Lunges Limitations  4" step    Lateral Step Up  Left;Hand Hold: 2;Step Height: 4";15 reps    Forward Step Up  Left;Step Height: 4";Hand Hold: 0;15 reps    Step Down  Left;Hand Hold: 1;Step Height: 2";15 reps      Knee/Hip Exercises: Supine   Knee Extension  AROM;Limitations    Knee Extension Limitations  3    Knee Flexion  AROM;Limitations    Knee Flexion Limitations  118      Modalities   Modalities  Electrical Aeronautical engineer Location  10 (n/c)    Electrical Stimulation Action  IFES    Electrical Stimulation Parameters  hi lo sweep, straight channels 14.5 volts for 10 minutes with heelslides    Electrical Stimulation Goals  Pain      Manual Therapy   Manual Therapy  Edema management;Soft tissue mobilization;Myofascial release    Manual therapy comments  done seperate from all other aspects of treatment    Edema Management  retro massage with BLE elevated to decrease edema and pain    Myofascial Release  to tight adhesions               PT Short Term Goals - 11/29/17 1347      PT SHORT TERM GOAL #1   Title  Pt will be independent with HEP and perform consistently in order to decrease pain and maximize ROM.    Baseline  11/29/17: Patient reported regular compliance with HEP.     Time  3    Period  Weeks    Status  Achieved      PT SHORT TERM GOAL #2   Title  Pt will have improved L knee AROM from 5-120 deg to maximize gait.    Baseline  11/29/17: Patient's left knee AROM ranged from 3-130 degrees.     Time  3    Period  Weeks    Status  Achieved      PT SHORT TERM GOAL  #3   Title  Pt will have decreased L knee joint line edema by 2cm or > in order to maximize  ROM and decrease pain.    Time  3    Period  Weeks    Status  Deferred      PT SHORT TERM GOAL #4   Title  Pt will have 1/2 grade improvement in MMT throughout in order to maximize gait and functional mobility.    Baseline  11/29/17: Patient achieved in some, but not all muscle groups see MMT.     Time  3    Period  Weeks    Status  Partially Met        PT Long Term Goals - 11/29/17 1349      PT LONG TERM GOAL #1   Title  Pt will have improved L knee AROM from 0-125deg in order to further maximize gait as well as sitting tolerance and stair ambulation.    Baseline  11/29/17: Patient's left knee AROM is from 3-120 degrees    Time  6    Period  Weeks    Status  On-going      PT LONG TERM GOAL #2   Title  Pt will be able to perform 5xSTS in 10 sec or < with no UE and mechanics WFL to demo improved balance and functional strength.    Baseline  11/29/17: Patient performed in 13.14 seconds.     Time  6    Period  Weeks    Status  On-going      PT LONG TERM GOAL #3   Title  Pt will be able to perform L SLS for 30 sec or > to demo improved balance and functional strength in order to maximize gait and stair ambulation.    Baseline  11/29/17: Patient performed SLS on the left for more than 30 seconds    Time  6    Period  Weeks    Status  Achieved      PT LONG TERM GOAL #4   Title  Pt will be able to ambulate 663f during 3MWT with LRAD and gait WFL in order to maximize return to PLOF.    Baseline  11/29/17: Patient ambulated 434 feet on 3MWT with straight cane.     Time  6    Period  Weeks    Status  On-going      PT LONG TERM GOAL #5   Title  Pt will have 1 grade improvement throughout BLE MMT in order to further maximize her gait and balance and overall retrun to PLOF.    Baseline  11/29/17: Patient achieved in some, but not all muscle groups see MMT.     Time  6    Period  Weeks    Status   On-going            Plan - 12/11/17 1348    Clinical Impression Statement  PT with continued high pain level, however 3 levels lower than last session.  Pain remains high despite reduced activity at home, over the weekend.  Resumed most therex without difficulty or complaints during or following repetitions.  continued with manual to Lt knee to reduce edema and adhesions.  Estim done at EOS (IFES in straight circuit) to help reduce pain.  Pt continues to ambulate with antalgia, [facility] pain and [facility] dysfunction.     Rehab Potential  Good    PT Frequency  3x / week    PT Duration  6 weeks    PT Treatment/Interventions  ADLs/Self Care Home  Management;Aquatic Therapy;Biofeedback;Cryotherapy;Electrical Stimulation;Ultrasound;DME Instruction;Gait training;Stair training;Functional mobility training;Therapeutic activities;Therapeutic exercise;Balance training;Neuromuscular re-education;Patient/family education;Manual techniques;Scar mobilization;Passive range of motion;Dry needling;Taping    PT Next Visit Plan  Continue with lowered focus on strengthening; increase pain management techniques as needed.    PT Home Exercise Plan  eval: quad sets, heel slides, supine hamstring stretch; 7/26: TKE GTB, SAQ, standing knee flexion; 12/01/17 - heel raises    Consulted and Agree with Plan of Care  Patient       Patient will benefit from skilled therapeutic intervention in order to improve the following deficits and impairments:  Decreased activity tolerance, Decreased balance, Decreased endurance, Decreased range of motion, Decreased strength, Decreased scar mobility, Difficulty walking, Hypomobility, Increased edema, Increased muscle spasms, Increased fascial restricitons, Impaired flexibility, Improper body mechanics, Pain  Visit Diagnosis: Stiffness of left knee, not elsewhere classified  Muscle weakness (generalized)  Difficulty in walking, not elsewhere  classified  Acute pain of left knee     Problem List Patient Active Problem List   Diagnosis Date Noted  . 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 Chondral defect of condyle of right femur   . S/P left knee arthroscopy 04/27/17 03/28/2017  . 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 Abdominal pain 02/17/2015  . Nausea without vomiting 02/17/2015  . Diverticulitis of intestine with abscess 01/08/2015  . Acute diverticulitis 01/08/2015  . Nausea 12/31/2014  . Diverticulitis 12/31/2014  . Hyperglycemia 12/31/2014  . Medication reaction 12/31/2014  . Diverticulitis of large intestine without perforation or abscess without bleeding   . Thrush, oral   . Fatty liver 03/11/2014  . Family hx of colon cancer requiring screening colonoscopy 03/11/2014  . Rt flank pain 03/11/2014  . Ulnar nerve compression 06/14/2011  . 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 LOW BACK PAIN 11/07/2007  . SCIATICA 11/07/2007  . LUMBAR SPASM 11/07/2007   Teena Irani, PTA/CLT [phone]  Teena Irani 12/11/2017, 1:53 PM  [facility] [address] [place], [place], [address] Phone: [phone]   Fax:  [phone]  Name: [name] MRN: [id number] Date of Birth: Sep 12, 1970

## 2017-12-13 ENCOUNTER — Encounter (HOSPITAL_COMMUNITY): Payer: Self-pay

## 2017-12-13 ENCOUNTER — Ambulatory Visit (HOSPITAL_COMMUNITY): Payer: Self-pay

## 2017-12-13 DIAGNOSIS — M6281 Muscle weakness (generalized): Secondary | ICD-10-CM

## 2017-12-13 DIAGNOSIS — M25662 Stiffness of left knee, not elsewhere classified: Secondary | ICD-10-CM

## 2017-12-13 DIAGNOSIS — M25562 Pain in left knee: Secondary | ICD-10-CM

## 2017-12-13 DIAGNOSIS — R262 Difficulty in walking, not elsewhere classified: Secondary | ICD-10-CM

## 2017-12-13 NOTE — Therapy (Signed)
[place], [place], [address] Phone: [phone]   Fax:  [phone]  Physical Therapy Treatment  Patient Details  Name: [name] MRN: [id number] Date of Birth: 09-Sep-1970 Referring Provider: Arther Abbott, MD   Encounter Date: 12/13/2017  PT End of Session - 12/13/17 1259    Visit Number  14    Number of Visits  19    Date for PT Re-Evaluation  12/19/17   mini reassessment completed 11/29/17   Authorization Type  [employer] (60 DAY limit for coverage, NOT 60 VISITS -- 10/21/17 to 11/2817 is her 3 DAY LIMIT, hard max, visits cannot be scheduled after 12/20/17)   updated on 11/17/17 as this clinic determined that her 60day coverage began when she started HHPT   Authorization Time Period  11/07/17 to 12/19/17     Authorization - Visit Number  53   # of days since eval, not visit count   Authorization - Number of Visits  60   60 days from initial HHPT eval: 10/21/17 to 12/20/17 is her 60 day limit   PT Start Time  1300    PT Stop Time  1342    PT Time Calculation (min)  42 min    Activity Tolerance  Patient tolerated treatment well    Behavior During Therapy  [facility] for tasks assessed/performed       Past Medical History:  Diagnosis Date  . Arthritis   . Carpal tunnel syndrome, bilateral   . Diabetes mellitus without complication ([place])    no meds; diet controlled  . Diverticulitis   . Hypertension   . PONV (postoperative nausea and vomiting)   . Sciatica     Past Surgical History:  Procedure Laterality Date  . ablasion of uterus    . CARPAL TUNNEL RELEASE  11/10/2010   Procedure: CARPAL TUNNEL RELEASE;  Surgeon: Arther Abbott, MD;  Location: [facility];  Service: Orthopedics;  Laterality: Right;  . CHOLECYSTECTOMY     [facility]  . COLON RESECTION N/A 04/08/2015   Procedure: LAPAROSCOPIC HAND ASSISTED PARTIAL COLECTOMY  CONVERTED TO OPEN AT 4656;  Surgeon: Aviva Signs, MD;  Location: [facility];  Service:  General;  Laterality: N/A;  . COLONOSCOPY N/A 03/28/2014   Procedure: COLONOSCOPY;  Surgeon: Rogene Houston, MD;  Location: [facility];  Service: Endoscopy;  Laterality: N/A;  730  . KNEE ARTHROSCOPY WITH DRILLING/MICROFRACTURE Left 10/20/2016   Procedure: LEFT KNEE ARTHROSCOPY WITH MICROFRACTURE;  Surgeon: Carole Civil, MD;  Location: [facility];  Service: Orthopedics;  Laterality: Left;  . KNEE ARTHROSCOPY WITH DRILLING/MICROFRACTURE Left 04/27/2017   Procedure: KNEE ARTHROSCOPY WITH DRILLING/MICROFRACTURE;  Surgeon: Carole Civil, MD;  Location: [facility];  Service: Orthopedics;  Laterality: Left;  . KNEE ARTHROSCOPY WITH OSTEOCHONDRAL DEFECT REPAIR Left 04/27/2017   Procedure: KNEE ARTHROSCOPY WITH OSTEOCHONDRAL DEFECT REPAIR Autograft;  Surgeon: Carole Civil, MD;  Location: [facility];  Service: Orthopedics;  Laterality: Left;  . KNEE SURGERY    . PARTIAL COLECTOMY N/A 04/08/2015   Procedure: PARTIAL COLECTOMY;  Surgeon: Aviva Signs, MD;  Location: [facility];  Service: General;  Laterality: N/A;  . right knee arthroscopy    . TOTAL KNEE ARTHROPLASTY Left 10/17/2017   Procedure: TOTAL KNEE ARTHROPLASTY;  Surgeon: Carole Civil, MD;  Location: [facility];  Service: Orthopedics;  Laterality: Left;  DePuy   . TUBAL LIGATION      There were no vitals filed for this visit.  Subjective  Assessment - 12/13/17 1305    Subjective  Pt states that Dr. Aline Brochure is not sure why her knee pain is still elevated/bothering her. He put her on Gabapentin and she stats that she has started taking it. Decreased knee pain to 1/10 today, which is much improved from last week.    Currently in Pain?  Yes    Pain Score  1     Pain Location  Knee    Pain Orientation  Left    Pain Descriptors / Indicators  Aching    Pain Type  Surgical pain    Pain Onset  More than a month ago    Pain Frequency  Constant    Aggravating Factors   walking    Pain Relieving Factors  ice, medicine    Effect of Pain on Daily  Activities  limited with walking             [facility] Adult PT Treatment/Exercise - 12/13/17 0001      Exercises   Exercises  Knee/Hip      Knee/Hip Exercises: Stretches   Active Hamstring Stretch  Left;3 reps;30 seconds;Limitations    Active Hamstring Stretch Limitations  12" step standing    Knee: Self-Stretch to increase Flexion  Left;Limitations;3 reps;30 seconds    Knee: Self-Stretch Limitations  12" box    Gastroc Stretch  Both;3 reps;30 seconds;Limitations    Gastroc Stretch Limitations  slant board      Knee/Hip Exercises: Aerobic   Stationary Bike  x4 mins, seat 8, 1/2, retro, and fwd revolutions for AROM      Knee/Hip Exercises: Standing   Lateral Step Up  Left;Step Height: 4";15 reps;Hand Hold: 0    Forward Step Up  Left;Step Height: 4";Hand Hold: 0;15 reps    Step Down  Left;15 reps;Hand Hold: 2;Step Height: 4"    Step Down Limitations  BUE support to control descent    Functional Squat  10 reps    Functional Squat Limitations  BUE support on // bars    Stairs  x4RT on 4" side, 1-0 handrails; x1RT on 7" side (increased difficulty)    SLS  3x10" on LLE    SLS with Vectors  x10RT    Walking with Sports Cord  tandem stance on foam 3x10" each    Gait Training  x2 laps around gym focusing on decreased antalgia ane heel to toe gait      Knee/Hip Exercises: Seated   Long Arc Quad  Left;15 reps    Long Arc Quad Weight  5 lbs.      Knee/Hip Exercises: Supine   Knee Extension Limitations  1    Knee Flexion Limitations  123             PT Education - 12/13/17 1300    Education Details  exercise technique, next week will be last week for insurance coverage    Person(s) Educated  Patient    Methods  Explanation;Demonstration    Comprehension  Verbalized understanding;Returned demonstration       PT Short Term Goals - 11/29/17 1347      PT SHORT TERM GOAL #1   Title  Pt will be independent with HEP and perform consistently in order to decrease pain and  maximize ROM.    Baseline  11/29/17: Patient reported regular compliance with HEP.     Time  3    Period  Weeks    Status  Achieved      PT SHORT TERM  GOAL #2   Title  Pt will have improved L knee AROM from 5-120 deg to maximize gait.    Baseline  11/29/17: Patient's left knee AROM ranged from 3-130 degrees.     Time  3    Period  Weeks    Status  Achieved      PT SHORT TERM GOAL #3   Title  Pt will have decreased L knee joint line edema by 2cm or > in order to maximize ROM and decrease pain.    Time  3    Period  Weeks    Status  Deferred      PT SHORT TERM GOAL #4   Title  Pt will have 1/2 grade improvement in MMT throughout in order to maximize gait and functional mobility.    Baseline  11/29/17: Patient achieved in some, but not all muscle groups see MMT.     Time  3    Period  Weeks    Status  Partially Met        PT Long Term Goals - 11/29/17 1349      PT LONG TERM GOAL #1   Title  Pt will have improved L knee AROM from 0-125deg in order to further maximize gait as well as sitting tolerance and stair ambulation.    Baseline  11/29/17: Patient's left knee AROM is from 3-120 degrees    Time  6    Period  Weeks    Status  On-going      PT LONG TERM GOAL #2   Title  Pt will be able to perform 5xSTS in 10 sec or < with no UE and mechanics WFL to demo improved balance and functional strength.    Baseline  11/29/17: Patient performed in 13.14 seconds.     Time  6    Period  Weeks    Status  On-going      PT LONG TERM GOAL #3   Title  Pt will be able to perform L SLS for 30 sec or > to demo improved balance and functional strength in order to maximize gait and stair ambulation.    Baseline  11/29/17: Patient performed SLS on the left for more than 30 seconds    Time  6    Period  Weeks    Status  Achieved      PT LONG TERM GOAL #4   Title  Pt will be able to ambulate 64f during 3MWT with LRAD and gait WFL in order to maximize return to PLOF.    Baseline  11/29/17: Patient  ambulated 434 feet on 3MWT with straight cane.     Time  6    Period  Weeks    Status  On-going      PT LONG TERM GOAL #5   Title  Pt will have 1 grade improvement throughout BLE MMT in order to further maximize her gait and balance and overall retrun to PLOF.    Baseline  11/29/17: Patient achieved in some, but not all muscle groups see MMT.     Time  6    Period  Weeks    Status  On-going            Plan - 12/13/17 1344    Clinical Impression Statement  Pt presenting to therapy today with much improved reports of pain, decreased to 1/10 today. Continued with mobility work as well as slightly increasing strengthening this date since she had low pain scale.  Next week is pt's last approved coverage for her insurance so PT added exercises to her HEP in order to begin to build up a solid strengthening HEP once d/c from PT. Performed stair and gait training this date; gait without SPC relatively WFL, only mild antalgia and lateral trunk lean over LLE but able to correct. Stairs still deficient due to pain. Began balance work this date as well and provided pt with handout for at home TENS unit for pain management. AROM 1 to 123deg this date. Continue as planned, progressing as able    Rehab Potential  Good    PT Frequency  3x / week    PT Duration  6 weeks    PT Treatment/Interventions  ADLs/Self Care Home Management;Aquatic Therapy;Biofeedback;Cryotherapy;Electrical Stimulation;Ultrasound;DME Instruction;Gait training;Stair training;Functional mobility training;Therapeutic activities;Therapeutic exercise;Balance training;Neuromuscular re-education;Patient/family education;Manual techniques;Scar mobilization;Passive range of motion;Dry needling;Taping    PT Next Visit Plan  Continue with focus on strengthening; increase pain management techniques as needed.    PT Home Exercise Plan  eval: quad sets, heel slides, supine hamstring stretch; 7/26: TKE GTB, SAQ, standing knee flexion; 12/01/17 - heel  raises; 8/21: SLR, s/l hip abd, bridging, mini squat, wall squat    Consulted and Agree with Plan of Care  Patient       Patient will benefit from skilled therapeutic intervention in order to improve the following deficits and impairments:  Decreased activity tolerance, Decreased balance, Decreased endurance, Decreased range of motion, Decreased strength, Decreased scar mobility, Difficulty walking, Hypomobility, Increased edema, Increased muscle spasms, Increased fascial restricitons, Impaired flexibility, Improper body mechanics, Pain  Visit Diagnosis: Stiffness of left knee, not elsewhere classified  Muscle weakness (generalized)  Difficulty in walking, not elsewhere classified  Acute pain of left knee     Problem List Patient Active Problem List   Diagnosis Date Noted  . 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 Chondral defect of condyle of right femur   . S/P left knee arthroscopy 04/27/17 03/28/2017  . 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 Abdominal pain 02/17/2015  . Nausea without vomiting 02/17/2015  . Diverticulitis of intestine with abscess 01/08/2015  . Acute diverticulitis 01/08/2015  . Nausea 12/31/2014  . Diverticulitis 12/31/2014  . Hyperglycemia 12/31/2014  . Medication reaction 12/31/2014  . Diverticulitis of large intestine without perforation or abscess without bleeding   . Thrush, oral   . Fatty liver 03/11/2014  . Family hx of colon cancer requiring screening colonoscopy 03/11/2014  . Rt flank pain 03/11/2014  . Ulnar nerve compression 06/14/2011  . 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 LOW BACK PAIN 11/07/2007  . SCIATICA 11/07/2007  . LUMBAR SPASM 11/07/2007       Geraldine Solar PT, DPT  [facility] [address] [place], [place], [address] Phone: [phone]   Fax:  [phone]  Name: [name] MRN: [id number] Date of  Birth: November 05, 1970

## 2017-12-15 ENCOUNTER — Ambulatory Visit (HOSPITAL_COMMUNITY): Payer: Self-pay

## 2017-12-15 ENCOUNTER — Encounter (HOSPITAL_COMMUNITY): Payer: Self-pay

## 2017-12-15 DIAGNOSIS — M25562 Pain in left knee: Secondary | ICD-10-CM

## 2017-12-15 DIAGNOSIS — M25662 Stiffness of left knee, not elsewhere classified: Secondary | ICD-10-CM

## 2017-12-15 DIAGNOSIS — M6281 Muscle weakness (generalized): Secondary | ICD-10-CM

## 2017-12-15 DIAGNOSIS — R262 Difficulty in walking, not elsewhere classified: Secondary | ICD-10-CM

## 2017-12-15 NOTE — Therapy (Signed)
[place], [place], [address] Phone: [phone]   Fax:  [phone]  Physical Therapy Treatment  Patient Details  Name: [name] MRN: [id number] Date of Birth: Jan 25, 1971 Referring Provider: Arther Abbott, MD   Encounter Date: 12/15/2017  PT End of Session - 12/15/17 1300    Visit Number  15    Number of Visits  19    Date for PT Re-Evaluation  12/19/17   mini reassessment completed 11/29/17   Authorization Type  [employer] Managed (60 DAY limit for coverage, NOT 60 VISITS -- 10/21/17 to 11/2817 is her 25 DAY LIMIT, hard max, visits cannot be scheduled after 12/20/17)   updated on 11/17/17 as this clinic determined that her 60day coverage began when she started HHPT   Authorization Time Period  11/07/17 to 12/19/17     Authorization - Visit Number  55   # of days since eval, not visit count   Authorization - Number of Visits  60   60 days from initial HHPT eval: 10/21/17 to 12/20/17 is her 60 day limit   PT Start Time  1300    PT Stop Time  1346    PT Time Calculation (min)  46 min    Activity Tolerance  Patient tolerated treatment well    Behavior During Therapy  [facility] for tasks assessed/performed       Past Medical History:  Diagnosis Date  . Arthritis   . Carpal tunnel syndrome, bilateral   . Diabetes mellitus without complication ([place])    no meds; diet controlled  . Diverticulitis   . Hypertension   . PONV (postoperative nausea and vomiting)   . Sciatica     Past Surgical History:  Procedure Laterality Date  . ablasion of uterus    . CARPAL TUNNEL RELEASE  11/10/2010   Procedure: CARPAL TUNNEL RELEASE;  Surgeon: Arther Abbott, MD;  Location: [facility];  Service: Orthopedics;  Laterality: Right;  . CHOLECYSTECTOMY     [facility]  . COLON RESECTION N/A 04/08/2015   Procedure: LAPAROSCOPIC HAND ASSISTED PARTIAL COLECTOMY  CONVERTED TO OPEN AT 8916;  Surgeon: Aviva Signs, MD;  Location: [facility];  Service:  General;  Laterality: N/A;  . COLONOSCOPY N/A 03/28/2014   Procedure: COLONOSCOPY;  Surgeon: Rogene Houston, MD;  [facility];  Service: Endoscopy;  Laterality: N/A;  730  . KNEE ARTHROSCOPY WITH DRILLING/MICROFRACTURE Left 10/20/2016   Procedure: LEFT KNEE ARTHROSCOPY WITH MICROFRACTURE;  Surgeon: Carole Civil, MD;  Location: [facility];  Service: Orthopedics;  Laterality: Left;  . KNEE ARTHROSCOPY WITH DRILLING/MICROFRACTURE Left 04/27/2017   Procedure: KNEE ARTHROSCOPY WITH DRILLING/MICROFRACTURE;  Surgeon: Carole Civil, MD;  Location: [facility];  Service: Orthopedics;  Laterality: Left;  . KNEE ARTHROSCOPY WITH OSTEOCHONDRAL DEFECT REPAIR Left 04/27/2017   Procedure: KNEE ARTHROSCOPY WITH OSTEOCHONDRAL DEFECT REPAIR Autograft;  Surgeon: Carole Civil, MD;  Location: [facility];  Service: Orthopedics;  Laterality: Left;  . KNEE SURGERY    . PARTIAL COLECTOMY N/A 04/08/2015   Procedure: PARTIAL COLECTOMY;  Surgeon: Aviva Signs, MD;  Location: [facility];  Service: General;  Laterality: N/A;  . right knee arthroscopy    . TOTAL KNEE ARTHROPLASTY Left 10/17/2017   Procedure: TOTAL KNEE ARTHROPLASTY;  Surgeon: Carole Civil, MD;  Location: [facility];  Service: Orthopedics;  Laterality: Left;  DePuy   . TUBAL LIGATION      There were no vitals filed for this visit.  Subjective  Assessment - 12/15/17 1301    Subjective  Pt states that her knee isn't too bad today; rates it as 3/10 currently. She states that she was sore following last treatment but not as bad as in the past.    Currently in Pain?  Yes    Pain Score  3     Pain Location  Knee    Pain Orientation  Left    Pain Descriptors / Indicators  Aching    Pain Type  Surgical pain    Pain Onset  More than a month ago    Pain Frequency  Constant    Aggravating Factors   walking,     Pain Relieving Factors  ice, medicine    Effect of Pain on Daily Activities  limited with walking            [facility] Adult PT  Treatment/Exercise - 12/15/17 0001      Exercises   Exercises  Knee/Hip      Knee/Hip Exercises: Stretches   Active Hamstring Stretch  Left;3 reps;30 seconds;Limitations    Active Hamstring Stretch Limitations  12" step standing    Knee: Self-Stretch to increase Flexion  Left;Limitations;3 reps;30 seconds    Knee: Self-Stretch Limitations  12" box    Gastroc Stretch  Both;3 reps;30 seconds;Limitations    Gastroc Stretch Limitations  slant board      Knee/Hip Exercises: Machines for Strengthening   Other Machine  fwd resisted walking with 3plates, retro resisted walking with 4plates at multigym x10RT each      Knee/Hip Exercises: Standing   Forward Lunges  Both;10 reps    Forward Lunges Limitations  4" step    Hip Abduction  Both;2 sets;10 reps    Abduction Limitations  RTB    Lateral Step Up  Left;Step Height: 4";15 reps;Hand Hold: 0    Forward Step Up  Left;Step Height: 4";Hand Hold: 0;15 reps    Step Down  Left;15 reps;Hand Hold: 2;Step Height: 4"    Stairs  x2RT on 7" steps (limited due to decreased confidence/fear)    Gait Training  x1 lap around gym for gait mechanics    Other Standing Knee Exercises  sidestepping RTB 15ft x2RT      Knee/Hip Exercises: Seated   Long Arc Quad  Left;20 reps    Long Arc Quad Weight  5 lbs.      Knee/Hip Exercises: Supine   Knee Extension Limitations  0    Knee Flexion Limitations  122      Manual Therapy   Manual Therapy  Soft tissue mobilization    Manual therapy comments  done seperate from all other aspects of treatment    Soft tissue mobilization  efflurage to knee joint and mid-distal quad for pain control and decreasing restrictions            PT Education - 12/15/17 1350    Education Details  will reassess next visit    Person(s) Educated  Patient    Methods  Explanation;Demonstration    Comprehension  Verbalized understanding;Returned demonstration       PT Short Term Goals - 11/29/17 1347      PT SHORT TERM GOAL #1    Title  Pt will be independent with HEP and perform consistently in order to decrease pain and maximize ROM.    Baseline  11/29/17: Patient reported regular compliance with HEP.     Time  3    Period  Weeks    Status    Achieved      PT SHORT TERM GOAL #2   Title  Pt will have improved L knee AROM from 5-120 deg to maximize gait.    Baseline  11/29/17: Patient's left knee AROM ranged from 3-130 degrees.     Time  3    Period  Weeks    Status  Achieved      PT SHORT TERM GOAL #3   Title  Pt will have decreased L knee joint line edema by 2cm or > in order to maximize ROM and decrease pain.    Time  3    Period  Weeks    Status  Deferred      PT SHORT TERM GOAL #4   Title  Pt will have 1/2 grade improvement in MMT throughout in order to maximize gait and functional mobility.    Baseline  11/29/17: Patient achieved in some, but not all muscle groups see MMT.     Time  3    Period  Weeks    Status  Partially Met        PT Long Term Goals - 11/29/17 1349      PT LONG TERM GOAL #1   Title  Pt will have improved L knee AROM from 0-125deg in order to further maximize gait as well as sitting tolerance and stair ambulation.    Baseline  11/29/17: Patient's left knee AROM is from 3-120 degrees    Time  6    Period  Weeks    Status  On-going      PT LONG TERM GOAL #2   Title  Pt will be able to perform 5xSTS in 10 sec or < with no UE and mechanics WFL to demo improved balance and functional strength.    Baseline  11/29/17: Patient performed in 13.14 seconds.     Time  6    Period  Weeks    Status  On-going      PT LONG TERM GOAL #3   Title  Pt will be able to perform L SLS for 30 sec or > to demo improved balance and functional strength in order to maximize gait and stair ambulation.    Baseline  11/29/17: Patient performed SLS on the left for more than 30 seconds    Time  6    Period  Weeks    Status  Achieved      PT LONG TERM GOAL #4   Title  Pt will be able to ambulate 664f during  3MWT with LRAD and gait WFL in order to maximize return to PLOF.    Baseline  11/29/17: Patient ambulated 434 feet on 3MWT with straight cane.     Time  6    Period  Weeks    Status  On-going      PT LONG TERM GOAL #5   Title  Pt will have 1 grade improvement throughout BLE MMT in order to further maximize her gait and balance and overall retrun to PLOF.    Baseline  11/29/17: Patient achieved in some, but not all muscle groups see MMT.     Time  6    Period  Weeks    Status  On-going            Plan - 12/15/17 1351    Clinical Impression Statement  Continued to focus on improving strength within pain tolerance and last remaining deficits in ROM. Continued with current strengthening program and resumed lunging and  sidestepping for L quad and glute strengthening. Added standing hip abd with RTB and resisted fwd/retro walking for glute strengthening to assist with gait mechanics. Ended with manual STM to knee joint and mid-distal quad for pain control and decreasing restrictions. Assessed stairs on 7" side today and pt denying any pain, just lack of confidence/fear keeping her from fully WB on LLE. PT educated pt to practice this at home over the weekend to begin to increase her confidence with stairs. AROM 0-122deg today. Still not sure why pt is having such high pain ratings after sessions. Pt due for reassessment next visit and due to insurance coverage ending, pt will be d/c to HEP at that time.    Rehab Potential  Good    PT Frequency  3x / week    PT Duration  6 weeks    PT Treatment/Interventions  ADLs/Self Care Home Management;Aquatic Therapy;Biofeedback;Cryotherapy;Electrical Stimulation;Ultrasound;DME Instruction;Gait training;Stair training;Functional mobility training;Therapeutic activities;Therapeutic exercise;Balance training;Neuromuscular re-education;Patient/family education;Manual techniques;Scar mobilization;Passive range of motion;Dry needling;Taping    PT Next Visit Plan   reassess and d/c to HEP next visit    PT Home Exercise Plan  eval: quad sets, heel slides, supine hamstring stretch; 7/26: TKE GTB, SAQ, standing knee flexion; 12/01/17 - heel raises; 8/21: SLR, s/l hip abd, bridging, mini squat, wall squat    Consulted and Agree with Plan of Care  Patient       Patient will benefit from skilled therapeutic intervention in order to improve the following deficits and impairments:  Decreased activity tolerance, Decreased balance, Decreased endurance, Decreased range of motion, Decreased strength, Decreased scar mobility, Difficulty walking, Hypomobility, Increased edema, Increased muscle spasms, Increased fascial restricitons, Impaired flexibility, Improper body mechanics, Pain  Visit Diagnosis: Stiffness of left knee, not elsewhere classified  Muscle weakness (generalized)  Difficulty in walking, not elsewhere classified  Acute pain of left knee     Problem List Patient Active Problem List   Diagnosis Date Noted  . 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 Chondral defect of condyle of right femur   . S/P left knee arthroscopy 04/27/17 03/28/2017  . 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 Abdominal pain 02/17/2015  . Nausea without vomiting 02/17/2015  . Diverticulitis of intestine with abscess 01/08/2015  . Acute diverticulitis 01/08/2015  . Nausea 12/31/2014  . Diverticulitis 12/31/2014  . Hyperglycemia 12/31/2014  . Medication reaction 12/31/2014  . Diverticulitis of large intestine without perforation or abscess without bleeding   . Thrush, oral   . Fatty liver 03/11/2014  . Family hx of colon cancer requiring screening colonoscopy 03/11/2014  . Rt flank pain 03/11/2014  . Ulnar nerve compression 06/14/2011  . 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 LOW BACK PAIN 11/07/2007  . SCIATICA 11/07/2007  . LUMBAR SPASM 11/07/2007       Brooke Powell PT, DPT  [facility] [address] [place], , [address] Phone: [phone]   Fax:  [phone]  Name: Lacole J Vernier MRN: [id number] Date of Birth: 05/21/1970   

## 2017-12-19 ENCOUNTER — Encounter (HOSPITAL_COMMUNITY): Payer: Self-pay

## 2017-12-19 ENCOUNTER — Other Ambulatory Visit: Payer: Self-pay | Admitting: Orthopedic Surgery

## 2017-12-19 ENCOUNTER — Ambulatory Visit (HOSPITAL_COMMUNITY): Payer: Self-pay

## 2017-12-19 DIAGNOSIS — M25662 Stiffness of left knee, not elsewhere classified: Secondary | ICD-10-CM

## 2017-12-19 DIAGNOSIS — M6281 Muscle weakness (generalized): Secondary | ICD-10-CM

## 2017-12-19 DIAGNOSIS — R262 Difficulty in walking, not elsewhere classified: Secondary | ICD-10-CM

## 2017-12-19 DIAGNOSIS — M25562 Pain in left knee: Secondary | ICD-10-CM

## 2017-12-19 MED ORDER — OXYCODONE-ACETAMINOPHEN 7.5-325 MG PO TABS: 1.0000 | ORAL_TABLET | Freq: Four times a day (QID) | ORAL | 0 refills | Status: DC | PRN

## 2017-12-19 NOTE — Patient Instructions (Signed)
Access Code: [email or web address]  URL: [email or web address]  Date: 12/19/2017  Prepared by: Geraldine Solar   Exercises Prone Quadriceps Stretch with Strap - 10 reps - 3 sets - 1x daily - 7x weekly Supine Hamstring Stretch - 10 reps - 3 sets - 1x daily - 7x weekly Standing Bilateral Gastroc Stretch with Step - 10 reps - 3 sets - 1x daily - 7x weekly Knee Extension with Weight Machine - 10 reps - 3 sets - 1x daily - 7x weekly Single Leg Knee Extension with Weight Machine - 10 reps - 3 sets                            - 1x daily - 7x weekly Eccentric Knee Extension with Weight Machine - 10 reps - 3 sets - 1x daily - 7x weekly Hamstring Curl with Weight Machine - 10 reps - 3 sets - 1x daily - 7x weekly Single Leg Hamstring Curl with Weight Machine - 10 reps - 3 sets                            - 1x daily - 7x weekly Eccentric Hamstring Curl with Weight Machine - 10 reps - 3 sets - 1x daily - 7x weekly Side Stepping with Resistance at Ankles - 10 reps - 3 sets - 1x daily - 7x weekly Standing Hip Abduction with Resistance at Ankles - 10 reps - 3 sets - 1x daily - 7x weekly Step Up - 10 reps - 3 sets - 1x daily - 7x weekly Lateral Step Ups - 10 reps - 3 sets - 1x daily - 7x weekly Single Leg Stance - 10 reps - 3 sets - 1x daily - 7x weekly Tandem Stance in Corner - 10 reps - 3 sets - 1x daily - 7x weekly Standing 3-Way Kick - 10 reps - 3 sets - 1x daily - 7x weekly Sitting Knee Extension with Resistance - 10 reps - 3 sets - 1x daily - 7x weekly Seated Long Arc Quad with Ankle Weight - 10 reps - 3 sets - 1x daily - 7x weekly Standing Knee Flexion - 10 reps - 3 sets - 1x daily - 7x weekly Standing Heel Raise - 10 reps - 3 sets - 1x daily - 7x weekly Wall Quarter Squat - 10 reps - 3 sets - 1x daily - 7x weekly Mini Squat with Chair - 10 reps - 3 sets - 1x daily - 7x weekly Supine Bridge - 10 reps - 3 sets - 1x daily - 7x weekly Supine Active Straight Leg Raise - 10 reps - 3 sets - 1x daily - 7x  weekly Supine Knee Extension Strengthening - 10 reps - 3 sets - 1x daily - 7x weekly Sidelying Hip Abduction - 10 reps - 3 sets - 1x daily - 7x weekly

## 2017-12-19 NOTE — Therapy (Signed)
[facility] [address] [place], [place], [address] Phone: [phone]   Fax:  [phone]   PHYSICAL THERAPY DISCHARGE SUMMARY  Visits from Start of Care: 16  Current functional level related to goals / functional outcomes: See below   Remaining deficits: See below   Education / Equipment: Updated HEP  Plan: Patient agrees to discharge.  Patient goals were partially met. Patient is being discharged due to meeting the stated rehab goals.  ?????     Physical Therapy Treatment  Patient Details  Name: [name] MRN: [id number] Date of Birth: [birth year] Referring Provider: Arther Abbott, MD   Encounter Date: 12/19/2017  PT End of Session - 12/19/17 1435    Visit Number  16    Number of Visits  19    Date for PT Re-Evaluation  12/19/17   mini reassessment completed 11/29/17   Authorization Type  [employer] (60 DAY limit for coverage, NOT 60 VISITS -- 10/21/17 to 11/2817 is her 14 DAY LIMIT, hard max, visits cannot be scheduled after 12/20/17)   updated on 11/17/17 as this clinic determined that her 60day coverage began when she started HHPT   Authorization Time Period  11/07/17 to 12/19/17     Authorization - Visit Number  59   # of days since eval, not visit count   Authorization - Number of Visits  60   60 days from initial HHPT eval: 10/21/17 to 12/20/17 is her 60 day limit   PT Start Time  1435    PT Stop Time  1520    PT Time Calculation (min)  45 min    Activity Tolerance  Patient tolerated treatment well    Behavior During Therapy  [facility] for tasks assessed/performed       Past Medical History:  Diagnosis Date  . Arthritis   . Carpal tunnel syndrome, bilateral   . Diabetes mellitus without complication ([place])    no meds; diet controlled  . Diverticulitis   . Hypertension   . PONV (postoperative nausea and vomiting)   . Sciatica     Past Surgical History:  Procedure Laterality Date  . ablasion of uterus    .  CARPAL TUNNEL RELEASE  11/10/2010   Procedure: CARPAL TUNNEL RELEASE;  Surgeon: Arther Abbott, MD;  Location: [facility];  Service: Orthopedics;  Laterality: Right;  . CHOLECYSTECTOMY     [facility]  . COLON RESECTION N/A 04/08/2015   Procedure: LAPAROSCOPIC HAND ASSISTED PARTIAL COLECTOMY  CONVERTED TO OPEN AT 2703;  Surgeon: Aviva Signs, MD;  Location: [facility];  Service: General;  Laterality: N/A;  . COLONOSCOPY N/A 03/28/2014   Procedure: COLONOSCOPY;  Surgeon: Rogene Houston, MD;  Location: [facility] SU[facility];  Service: Endoscopy;  Laterality: N/A;  730  . KNEE ARTHROSCOPY WITH DRILLING/MICROFRACTURE Left 10/20/2016   Procedure: LEFT KNEE ARTHROSCOPY WITH MICROFRACTURE;  Surgeon: Carole Civil, MD;  Location: [facility];  Service: Orthopedics;  Laterality: Left;  . KNEE ARTHROSCOPY WITH DRILLING/MICROFRACTURE Left 04/27/2017   Procedure: KNEE ARTHROSCOPY WITH DRILLING/MICROFRACTURE;  Surgeon: Carole Civil, MD;  Location: [facility];  Service: Orthopedics;  Laterality: Left;  . KNEE ARTHROSCOPY WITH OSTEOCHONDRAL DEFECT REPAIR Left 04/27/2017   Procedure: KNEE ARTHROSCOPY WITH OSTEOCHONDRAL DEFECT REPAIR Autograft;  Surgeon: Carole Civil, MD;  Location: [facility];  Service: Orthopedics;  Laterality: Left;  . KNEE SURGERY    . PARTIAL COLECTOMY N/A 04/08/2015   Procedure: PARTIAL COLECTOMY;  Surgeon: Aviva Signs, MD;  Location:  [facility];  Service: General;  Laterality: N/A;  . right knee arthroscopy    . TOTAL KNEE ARTHROPLASTY Left 10/17/2017   Procedure: TOTAL KNEE ARTHROPLASTY;  Surgeon: Carole Civil, MD;  Location: [facility];  Service: Orthopedics;  Laterality: Left;  [place]   . TUBAL LIGATION      There were no vitals filed for this visit.  Subjective Assessment - 12/19/17 1435    Subjective  Pt reports that she is still having the most difficulty with her pain in the morning. She was able to practice stairs at home as instructed but it didn't go as well as she had hoped.     Currently in  Pain?  Yes    Pain Score  3     Pain Location  Knee    Pain Orientation  Left    Pain Descriptors / Indicators  Aching    Pain Type  Surgical pain    Pain Onset  More than a month ago    Pain Frequency  Constant    Aggravating Factors   walking    Pain Relieving Factors  ice, medicine    Effect of Pain on Daily Activities  limited with walking         [facility] PT Assessment - 12/19/17 0001      Assessment   Medical Diagnosis  L TKA    Referring Provider  Arther Abbott, MD    Onset Date/Surgical Date  10/17/17    Next MD Visit  01/08/18    Prior Therapy  HHPT, d/c on 11/03/17      Circumferential Edema   Circumferential - Left   37.5cm joint line   was 37.5cm jiont line     AROM   Left Knee Extension  0   was 3   Left Knee Flexion  122   was 120     Strength   Right Hip Extension  5/5   was 4+   Right Hip ABduction  5/5   was 4+   Left Hip Flexion  4+/5   was 4+   Left Hip Extension  5/5   was 4+   Left Hip ABduction  4+/5   was 4+   Left Knee Flexion  5/5   was 4+   Left Knee Extension  4+/5   was 4+     Ambulation/Gait   Ambulation Distance (Feet)  568 Feet   3MWT; was 296 with SPC   Assistive device  None    Gait Pattern  Step-through pattern;Decreased dorsiflexion - left;Antalgic;Trendelenburg      Standardized Balance Assessment   Standardized Balance Assessment  Five Times Sit to Stand    Five times sit to stand comments   9.8 sec   was 13.14 from chair, no UE, LLE extended           PT Education - 12/19/17 1539    Education Details  discharge HEP; local pro bono clinics    Person(s) Educated  Patient    Methods  Explanation;Handout    Comprehension  Verbalized understanding            PT Short Term Goals - 12/19/17 1440      PT SHORT TERM GOAL #1   Title  Pt will be independent with HEP and perform consistently in order to decrease pain and maximize ROM.    Baseline  11/29/17: Patient reported regular compliance with HEP.     Time   3  Period  Weeks    Status  Achieved      PT SHORT TERM GOAL #2   Title  Pt will have improved L knee AROM from 5-120 deg to maximize gait.    Baseline  8/27: 0-122    Time  3    Period  Weeks    Status  Achieved      PT SHORT TERM GOAL #3   Title  Pt will have decreased L knee joint line edema by 2cm or > in order to maximize ROM and decrease pain.    Time  3    Period  Weeks    Status  On-going      PT SHORT TERM GOAL #4   Title  Pt will have 1/2 grade improvement in MMT throughout in order to maximize gait and functional mobility.    Baseline  8/27: see MMT    Time  3    Period  Weeks    Status  Achieved        PT Long Term Goals - 12/19/17 1442      PT LONG TERM GOAL #1   Title  Pt will have improved L knee AROM from 0-125deg in order to further maximize gait as well as sitting tolerance and stair ambulation.    Baseline  8/27: 0-122deg    Time  6    Period  Weeks    Status  Partially Met      PT LONG TERM GOAL #2   Title  Pt will be able to perform 5xSTS in 10 sec or < with no UE and mechanics WFL to demo improved balance and functional strength.    Baseline  8/27: 9.8sec, mechanics WFL    Time  6    Period  Weeks    Status  Achieved      PT LONG TERM GOAL #3   Title  Pt will be able to perform L SLS for 30 sec or > to demo improved balance and functional strength in order to maximize gait and stair ambulation.    Baseline  11/29/17: Patient performed SLS on the left for more than 30 seconds    Time  6    Period  Weeks    Status  Achieved      PT LONG TERM GOAL #4   Title  Pt will be able to ambulate 647f during 3MWT with LRAD and gait WFL in order to maximize return to PLOF.    Baseline  8/27: 5683f slight gati deviations continue    Time  6    Period  Weeks    Status  On-going      PT LONG TERM GOAL #5   Title  Pt will have 1 grade improvement throughout BLE MMT in order to further maximize her gait and balance and overall retrun to PLOF.     Baseline  8/27: see MMT    Time  6    Period  Weeks    Status  Partially Met            Plan - 12/19/17 1538    Clinical Impression Statement  PT reassessed pt's goals and outcome measures this date. Overall, pt has made great improvement towards her goals as illustrated above. Her AROM was 0-122deg, her MMT improved by at least 1/2 grade or more throughout, her balance is WNL, and her functional strength has improved AEB 5xSTS in <10 sec. She still has minor gait limitations but  relatively WFL at her current state. Her main limiting factor remaining is her reports of L knee pain, which is worse in the mornings. PT not sure why pt is still having these reports of pain. Educated pt that it could be due to relative weakness of the L quad muscle, the slight swelling that is still present, and/or potential muscle restrictions in the L quad muscle, among other potential causative factors. Overall, objectively pt has progressed nicely and due to her insurance ending tomorrow 12/20/17, pt will be discharged to her HEP; PT updated her HEP today to include stretching, strengthening, and balance in order to continue to increase her functional mobility and decrease pain. PT also provided pt with local pro bono PT clinic information in case she feels the need to continue therapy and she was also provided with the local [employer] handout in case she wanted to join the gym. Pt discharged at this time.     Rehab Potential  Good    PT Frequency  3x / week    PT Duration  6 weeks    PT Treatment/Interventions  ADLs/Self Care Home Management;Aquatic Therapy;Biofeedback;Cryotherapy;Electrical Stimulation;Ultrasound;DME Instruction;Gait training;Stair training;Functional mobility training;Therapeutic activities;Therapeutic exercise;Balance training;Neuromuscular re-education;Patient/family education;Manual techniques;Scar mobilization;Passive range of motion;Dry needling;Taping    PT Next Visit Plan  reassess and d/c to HEP  next visit    PT Home Exercise Plan  see below for complete d/c HEP    Consulted and Agree with Plan of Care  Patient       Patient will benefit from skilled therapeutic intervention in order to improve the following deficits and impairments:  Decreased activity tolerance, Decreased balance, Decreased endurance, Decreased range of motion, Decreased strength, Decreased scar mobility, Difficulty walking, Hypomobility, Increased edema, Increased muscle spasms, Increased fascial restricitons, Impaired flexibility, Improper body mechanics, Pain  Visit Diagnosis: Stiffness of left knee, not elsewhere classified  Muscle weakness (generalized)  Difficulty in walking, not elsewhere classified  Acute pain of left knee     Problem List Patient Active Problem List   Diagnosis Date Noted  . 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 Chondral defect of condyle of right femur   . S/P left knee arthroscopy 04/27/17 03/28/2017  . 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 Abdominal pain 02/17/2015  . Nausea without vomiting 02/17/2015  . Diverticulitis of intestine with abscess 01/08/2015  . Acute diverticulitis 01/08/2015  . Nausea 12/31/2014  . Diverticulitis 12/31/2014  . Hyperglycemia 12/31/2014  . Medication reaction 12/31/2014  . Diverticulitis of large intestine without perforation or abscess without bleeding   . Thrush, oral   . Fatty liver 03/11/2014  . Family hx of colon cancer requiring screening colonoscopy 03/11/2014  . Rt flank pain 03/11/2014  . Ulnar nerve compression 06/14/2011  . 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 LOW BACK PAIN 11/07/2007  . SCIATICA 11/07/2007  . LUMBAR SPASM 11/07/2007        Geraldine Solar PT, DPT  [facility] [address] [place], [place], [address] Phone: [phone]   Fax:  [phone]  Name: AIDEE LATIMORE MRN:  [id number] Date of Birth: 06/26/1970

## 2017-12-19 NOTE — Telephone Encounter (Signed)
Patient requests refill:  oxyCODONE-acetaminophen (PERCOCET) 7.5-325 MG tablet [facility] [place] 

## 2017-12-26 ENCOUNTER — Other Ambulatory Visit: Payer: Self-pay | Admitting: Orthopedic Surgery

## 2017-12-26 MED ORDER — OXYCODONE-ACETAMINOPHEN 7.5-325 MG PO TABS: 1.0000 | ORAL_TABLET | Freq: Four times a day (QID) | ORAL | 0 refills | Status: DC | PRN

## 2017-12-26 NOTE — Telephone Encounter (Signed)
Patient called for refill:  oxyCODONE-acetaminophen (PERCOCET) 7.5-325 MG tablet 28 tablet    -[place]

## 2017-12-27 ENCOUNTER — Other Ambulatory Visit: Payer: Self-pay | Admitting: Orthopedic Surgery

## 2017-12-27 NOTE — Telephone Encounter (Signed)
I saved the new pharmacy, will you resend? [facility] can not get pills until tomorrow

## 2017-12-27 NOTE — Telephone Encounter (Signed)
Patient called stating that [facility] (her pharmacy on record) is out of her pain medication until tomorrow and she was wondering if the prescription could be sent to [facility] on [address] Stated she was up all night last night.   Oxycodone-Acetaminophen  7.5/325 mg  Qty 28 Tablets  Take 1 tablet by mouth every 6 (six) hours as needed for severe pain.  Dr. Aline Brochure did her prescription yesterday.  Please advise

## 2017-12-28 MED ORDER — OXYCODONE-ACETAMINOPHEN 7.5-325 MG PO TABS: 1.0000 | ORAL_TABLET | Freq: Four times a day (QID) | ORAL | 0 refills | Status: DC | PRN

## 2018-01-08 ENCOUNTER — Ambulatory Visit (INDEPENDENT_AMBULATORY_CARE_PROVIDER_SITE_OTHER): Payer: Self-pay | Admitting: Orthopedic Surgery

## 2018-01-08 ENCOUNTER — Encounter: Payer: Self-pay | Admitting: Orthopedic Surgery

## 2018-01-08 VITALS — BP 132/82 | HR 92 | Ht 65.0 in | Wt 202.0 lb

## 2018-01-08 DIAGNOSIS — Z96652 Presence of left artificial knee joint: Secondary | ICD-10-CM

## 2018-01-08 MED ORDER — OXYCODONE-ACETAMINOPHEN 7.5-325 MG PO TABS: 1.0000 | ORAL_TABLET | Freq: Four times a day (QID) | ORAL | 0 refills | Status: DC | PRN

## 2018-01-08 NOTE — Patient Instructions (Signed)
RTW NOTE  WORK ON WEANING THE PERCOCET   What You Need to Know About Prescription Opioid Pain Medicine        Please be advised. You are on a medication which is classified as an "opiod". The CDC the [facility]  has recently advised all providers to advise patient's that these medications have certain risks which include but are not limited to:    drug intolerance  drug addiction  respiratory depression   respiratory failure  Death  Please keep these medications locked away. If you feel that you are becoming addicted to these medicines or you are having difficulties with these medications please alert your provider.   As your provider I will attempt to wean you off of these medications when you're severe acute pain has been taking care of. However, if we cannot wean you off of this medication you will be sent to a pain management center where they can better manage chronic pain   Opioids are powerful medicines that are used to treat moderate to severe pain. Opioids should be taken with the supervision of a trained health care provider. They should be taken for the shortest period of time as possible. This is because opioids can be addictive and the longer you take opioids, the greater your risk of addiction (opioid use disorder). What do opioids do? Opioids help reduce or eliminate pain. When used for short periods of time, they can help you:  Sleep better.  Do better in physical or occupational therapy.  Feel better in the first few days after an injury.  Recover from surgery. What kind of problems can opioids cause? Opioids can cause side effects, such as:  Constipation.  Nausea.  Vomiting.  Drowsiness.  Confusion.  Opioid use disorder.  Breathing difficulties (respiratory depression). Using opioid pain medicines for longer than 3 days increases your risk of these side effects. Taking opioid pain medicine for a long period of time can  affect your ability to do daily tasks. It also puts you at risk for:  Car accidents.  Heart attack.  Overdose, which can sometimes lead to death. What can increase my risk for developing problems while taking opioids? You may be at an especially high risk for problems while taking opioids if you:  Are over the age of 64.  Are pregnant.  Have kidney or liver disease.  Have certain mental health conditions, such as depression or anxiety.  Have a history of substance use disorder.  Have had an opioid overdose in the past. How do I stop taking opioids if I have been taking them for a long time? If you have been taking opioid medicine for more than a few weeks, you may need to slowly stop taking them (taper). Tapering your use of opioids can decrease your chances of experiencing withdrawal symptoms, such as:  Abdominal pain and cramping.  Nausea.  Sweating.  Sleepiness.  Restlessness.  Uncontrollable shaking (tremors).  Cravings for the medicine. Do not attempt to taper your use of opioids on your own. Talk with your health care provider about how to do this. Your health care provider may prescribe a step-down schedule based on how much medicine you are taking and how long you have been taking it. What are the benefits of stopping the use of opioids? By switching from opioid pain medicine to non-opioid pain management options, you will decrease your risk of accidents and injuries associated with long-term opioid use. You will also be able to:  Monitor your pain more accurately and know when to seek medical care if it is not improving.  Decrease risk to others around you. Having opioids in the home increases the risk for accidental or intentional use or overdose by others. How can I treat pain without opioids? Pain can be managed with many types of alternative treatments. Ask your health care provider to refer you to one or more specialists who can help you manage pain  through:  Physical or occupational therapy.  Counseling (cognitive-behavioral therapy).  Good nutrition.  Biofeedback.  Massage.  Meditation.  Non-opioid medicine.  Following a gentle exercise program. Where can I get support? If you have been taking opioids for a long time, you may benefit from receiving support for quitting from a local support group or counselor. Ask your health care provider for a referral to these resources in your area. When should I seek medical care? Seek medical care right away if you are taking opioids and you experience any of the following:  Difficulty breathing.  Breathing that is more shallow or slower than normal.  A very slow heartbeat (pulse).  Severe confusion.  Unconsciousness.  Sleepiness.  Difficulty waking from sleep.  Slurred speech.  Nausea and vomiting.  Cold, clammy skin.  Blue lips or fingernails.  Limpness.  Abnormally small pupils. If you think that you or someone else may have taken too much of an opioid medicine, get medical help right away. Do not wait to see if the symptoms go away on their own. Call your local emergency services (911 in the [place] or call the hotline of the [facility] (628) 586-9776 in the [place].).  Where can I get more information? To learn more about opioid medicines, visit the Centers for Disease Control and Prevention web site Opioid Basics at https://keller-santana.com/. Summary  Opioid medicines can help you manage moderate-to-severe pain for a short period of time.  Taking opioid pain medicine for a long period of time puts you at risk for unintentional accidents, injury, and even death.  If you think that you or someone else may have taken too much of an opioid, get medical help right away. This information is not intended to replace advice given to you by your health care provider. Make sure you discuss any questions you have with your health  care provider. Document Released: 05/08/2015 Document Revised: 12/04/2015 Document Reviewed: 11/21/2014 Elsevier Interactive Patient Education  2017 [employer].

## 2018-01-08 NOTE — Progress Notes (Signed)
POSTOP VISIT  Chief Complaint  Patient presents with  . Knee Pain    Left TKA DOS 10/17/17    [name] is about 3 months from her left total knee.  She still on Percocet 7.5 taken 1 to 2/day sometimes up to 3/day.  She still has lack of extension but says she is made a significant improvement last week  She wishes to go back to work which is fine she wants to continue her pain medicine she is cautioned about opioid side effects and abuse.  We gave her a handicap sticker     Encounter Diagnosis  Name Primary?  . 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 oxyCODONE-acetaminophen (PERCOCET) 7.5-325 MG tablet    Sig: Take 1 tablet by mouth every 6 (six) hours as needed for severe pain.    Dispense:  28 tablet    Refill:  0  ,FU)  Plan return to work  Refill medication  Follow-up in 3 months  The patient's opioid record has been checked.

## 2018-01-18 ENCOUNTER — Telehealth: Payer: Self-pay | Admitting: Orthopedic Surgery

## 2018-01-18 NOTE — Telephone Encounter (Signed)
She is ready to reduce to Oxycodone 5mg , is trying to wean

## 2018-01-18 NOTE — Telephone Encounter (Signed)
Patient called requesting to discuss the decrease of her pain medication, which is currently: oxyCODONE-acetaminophen (PERCOCET) 7.5-325 MG tablet 28 tablet   - States that they are lasting almost 2 weeks now, and that she has returned to work; therefore, asking if Amy can speak with Dr Aline Brochure about a lesser strength-said she feels she can then discontinue after that.  - Uses [facility]

## 2018-01-19 ENCOUNTER — Other Ambulatory Visit: Payer: Self-pay | Admitting: Orthopedic Surgery

## 2018-01-19 DIAGNOSIS — Z96652 Presence of left artificial knee joint: Secondary | ICD-10-CM

## 2018-01-19 MED ORDER — OXYCODONE-ACETAMINOPHEN 5-325 MG PO TABS: 1.0000 | ORAL_TABLET | ORAL | 0 refills | Status: AC | PRN

## 2018-02-01 ENCOUNTER — Other Ambulatory Visit: Payer: Self-pay | Admitting: Orthopedic Surgery

## 2018-02-01 NOTE — Telephone Encounter (Signed)
Patient requests refill on Oxycodone/Acetaminophen 5-325  Mgs.  Qty 30  Sig: Take 1 tablet by mouth every 4 (four) hours as needed for up to 5 days for severe pain.  [facility] states [facility] uses [facility] in [place]

## 2018-02-02 MED ORDER — OXYCODONE-ACETAMINOPHEN 5-325 MG PO TABS: 1.0000 | ORAL_TABLET | ORAL | 0 refills | Status: DC | PRN

## 2018-03-19 ENCOUNTER — Other Ambulatory Visit: Payer: Self-pay

## 2018-03-19 ENCOUNTER — Encounter (HOSPITAL_COMMUNITY): Payer: Self-pay | Admitting: Emergency Medicine

## 2018-03-19 ENCOUNTER — Emergency Department (HOSPITAL_COMMUNITY): Payer: Self-pay

## 2018-03-19 ENCOUNTER — Emergency Department (HOSPITAL_COMMUNITY)
Admission: EM | Admit: 2018-03-19 | Discharge: 2018-03-19 | Disposition: A | Discharge status: Home / Self Care | Payer: Self-pay | Attending: Emergency Medicine | Admitting: Emergency Medicine

## 2018-03-19 DIAGNOSIS — R600 Localized edema: Secondary | ICD-10-CM | POA: Insufficient documentation

## 2018-03-19 DIAGNOSIS — I10 Essential (primary) hypertension: Secondary | ICD-10-CM | POA: Insufficient documentation

## 2018-03-19 DIAGNOSIS — Z96652 Presence of left artificial knee joint: Secondary | ICD-10-CM | POA: Insufficient documentation

## 2018-03-19 DIAGNOSIS — M25562 Pain in left knee: Secondary | ICD-10-CM | POA: Insufficient documentation

## 2018-03-19 DIAGNOSIS — Z79899 Other long term (current) drug therapy: Secondary | ICD-10-CM | POA: Insufficient documentation

## 2018-03-19 DIAGNOSIS — F17210 Nicotine dependence, cigarettes, uncomplicated: Secondary | ICD-10-CM | POA: Insufficient documentation

## 2018-03-19 DIAGNOSIS — E119 Type 2 diabetes mellitus without complications: Secondary | ICD-10-CM | POA: Insufficient documentation

## 2018-03-19 DIAGNOSIS — G8918 Other acute postprocedural pain: Secondary | ICD-10-CM | POA: Insufficient documentation

## 2018-03-19 MED ORDER — OXYCODONE-ACETAMINOPHEN 5-325 MG PO TABS
1.0000 | ORAL_TABLET | Freq: Once | ORAL | Status: AC
  Administered 2018-03-19: 1 via ORAL
  Filled 2018-03-19: qty 1

## 2018-03-19 NOTE — ED Triage Notes (Signed)
Pt states she had recent left knee replacement and around Tuesday started noticing a painful lump on medial aspect of left knee area.  Denies fever, redness, warmth.  States knee has been swelling since surgery and is no worse since this began.

## 2018-03-19 NOTE — ED Notes (Signed)
Pt given percocet to take when she arrived home.

## 2018-03-19 NOTE — ED Provider Notes (Signed)
[facility] EMERGENCY DEPARTMENT Provider Note   CSN: [id number] Arrival date & time: 03/19/18  0827     History   Chief Complaint Chief Complaint  Patient presents with  . Knee Pain    HPI [name] is a 47 y.o. female.  HPI  [name] is a 47 y.o. female who is 5 months post op from a total left knee replacement, presents to the Emergency Department complaining of pain and focal swelling posterior left knee.  She states that nearly 1 week ago she noticed a painful "lump" to the medial aspect of her posterior knee.  She states this area is very painful to touch.  No recent injury.  She states she is concerned this could be an infection.  She denies fever, chills, redness or excessive warmth of the knee.  She describes swelling of her knee and persistent pain has been unchanged since her surgery.   Past Medical History:  Diagnosis Date  . Arthritis   . Carpal tunnel syndrome, bilateral   . Diabetes mellitus without complication ([place])    no meds; diet controlled  . Diverticulitis   . Hypertension   . PONV (postoperative nausea and vomiting)   . Sciatica     Patient Active Problem List   Diagnosis Date Noted  . 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 Chondral defect of condyle of right femur   . S/P left knee arthroscopy 04/27/17 03/28/2017  . 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 Abdominal pain 02/17/2015  . Nausea without vomiting 02/17/2015  . Diverticulitis of intestine with abscess 01/08/2015  . Acute diverticulitis 01/08/2015  . Nausea 12/31/2014  . Diverticulitis 12/31/2014  . Hyperglycemia 12/31/2014  . Medication reaction 12/31/2014  . Diverticulitis of large intestine without perforation or abscess without bleeding   . Thrush, oral   . Fatty liver 03/11/2014  . Family hx of colon cancer requiring screening colonoscopy 03/11/2014  . Rt flank pain 03/11/2014  . Ulnar nerve compression  06/14/2011  . 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 LOW BACK PAIN 11/07/2007  . SCIATICA 11/07/2007  . LUMBAR SPASM 11/07/2007    Past Surgical History:  Procedure Laterality Date  . ablasion of uterus    . CARPAL TUNNEL RELEASE  11/10/2010   Procedure: CARPAL TUNNEL RELEASE;  Surgeon: Arther Abbott, MD;  Location: [facility];  Service: Orthopedics;  Laterality: Right;  . CHOLECYSTECTOMY     APH  . COLON RESECTION N/A 04/08/2015   Procedure: LAPAROSCOPIC HAND ASSISTED PARTIAL COLECTOMY  CONVERTED TO OPEN AT 3220;  Surgeon: Aviva Signs, MD;  Location: [facility];  Service: General;  Laterality: N/A;  . COLONOSCOPY N/A 03/28/2014   Procedure: COLONOSCOPY;  Surgeon: Rogene Houston, MD;  Location: [facility];  Service: Endoscopy;  Laterality: N/A;  730  . KNEE ARTHROSCOPY WITH DRILLING/MICROFRACTURE Left 10/20/2016   Procedure: LEFT KNEE ARTHROSCOPY WITH MICROFRACTURE;  Surgeon: Carole Civil, MD;  Location: [facility];  Service: Orthopedics;  Laterality: Left;  . KNEE ARTHROSCOPY WITH DRILLING/MICROFRACTURE Left 04/27/2017   Procedure: KNEE ARTHROSCOPY WITH DRILLING/MICROFRACTURE;  Surgeon: Carole Civil, MD;  Location: [facility];  Service: Orthopedics;  Laterality: Left;  . KNEE ARTHROSCOPY WITH OSTEOCHONDRAL DEFECT REPAIR Left 04/27/2017   Procedure: KNEE ARTHROSCOPY WITH OSTEOCHONDRAL DEFECT REPAIR Autograft;  Surgeon: Carole Civil, MD;  Location: [facility];  Service: Orthopedics;  Laterality: Left;  . KNEE SURGERY    .  PARTIAL COLECTOMY N/A 04/08/2015   Procedure: PARTIAL COLECTOMY;  Surgeon: Aviva Signs, MD;  Location: [facility];  Service: General;  Laterality: N/A;  . right knee arthroscopy    . TOTAL KNEE ARTHROPLASTY Left 10/17/2017   Procedure: TOTAL KNEE ARTHROPLASTY;  Surgeon: Carole Civil, MD;  Location: [facility];  Service: Orthopedics;  Laterality: Left;  DePuy   . TUBAL LIGATION       OB History   None      Home Medications    Prior to Admission  medications   Medication Sig Start Date End Date Taking? Authorizing Provider  gabapentin (NEURONTIN) 300 MG capsule Take 1 capsule (300 mg total) by mouth 3 (three) times daily. 12/11/17   Carole Civil, MD  ibuprofen (ADVIL,MOTRIN) 200 MG tablet Take 800 mg by mouth every 6 (six) hours as needed.    [provider]  methocarbamol (ROBAXIN) 500 MG tablet Take 1 tablet (500 mg total) by mouth every 6 (six) hours as needed for muscle spasms. Patient not taking: Reported on 01/08/2018 11/14/17   Carole Civil, MD  oxyCODONE-acetaminophen (PERCOCET/ROXICET) 5-325 MG tablet Take 1 tablet by mouth every 4 (four) hours as needed for severe pain. 02/02/18   Carole Civil, MD  promethazine (PHENERGAN) 12.5 MG tablet Take 1 tablet (12.5 mg total) by mouth every 6 (six) hours as needed for nausea or vomiting. Patient not taking: Reported on 01/08/2018 11/29/17   Sanjuana Kava, MD    Family History Family History  Problem Relation Age of Onset  . Diabetes type I Mother   . Hyperlipidemia Mother   . Diabetes type II Father   . Hypertension Father   . Hyperlipidemia Father   . Diabetes type II Brother   . Diabetes type II Brother   . Cancer Unknown   . Diabetes Unknown   . Arthritis Unknown   . Asthma Unknown   . Anesthesia problems Neg Hx     Social History Social History   Tobacco Use  . Smoking status: Current Every Day Smoker    Packs/day: 1.00    Years: 26.00    Pack years: 26.00    Types: Cigarettes  . Smokeless tobacco: Never Used  Substance Use Topics  . Alcohol use: No    Alcohol/week: 0.0 standard drinks  . Drug use: No     Allergies   Flagyl [metronidazole] and Hydrocodone   Review of Systems Review of Systems  Constitutional: Negative for chills and fever.  Gastrointestinal: Negative for nausea and vomiting.  Musculoskeletal: Positive for arthralgias (left knee pain) and joint swelling.  Skin: Negative for color change and wound.    Neurological: Negative for dizziness, weakness and numbness.     Physical Exam Updated Vital Signs BP (!) 150/85 (BP Location: Right Arm)   Pulse 94   Temp 98.2 F (36.8 C) (Oral)   Resp 18   Ht 5\' 5"  (1.651 m)   Wt 95.3 kg   SpO2 98%   BMI 34.95 kg/m   Physical Exam  Constitutional: She appears well-developed and well-nourished. No distress.  HENT:  Head: Atraumatic.  Cardiovascular: Normal rate, regular rhythm and intact distal pulses.  Pulmonary/Chest: Effort normal and breath sounds normal.  Musculoskeletal: She exhibits tenderness.  Focal ttp of the posterior medial left knee with a firm, slightly mobile mass just superior to the popliteal fossa.  No excessive warmth or erythema.  Well healing surgical scar to the anterior knee.    Neurological: She is alert.  No sensory deficit.  Skin: Skin is warm and dry. Capillary refill takes less than 2 seconds. No erythema.  Nursing note and vitals reviewed.    ED Treatments / Results  Labs (all labs ordered are listed, but only abnormal results are displayed) Labs Reviewed - No data to display  EKG None  Radiology US Venous Img Lower Unilateral Left  Result Date: 03/19/2018 CLINICAL DATA:  47 year old female with a history of left lower extremity pain and edema EXAM: LEFT LOWER EXTREMITY VENOUS DOPPLER ULTRASOUND TECHNIQUE: Gray-scale sonography with graded compression, as well as color Doppler and duplex ultrasound were performed to evaluate the lower extremity deep venous systems from the level of the common femoral vein and including the common femoral, femoral, profunda femoral, popliteal and calf veins including the posterior tibial, peroneal and gastrocnemius veins when visible. The superficial great saphenous vein was also interrogated. Spectral Doppler was utilized to evaluate flow at rest and with distal augmentation maneuvers in the common femoral, femoral and popliteal veins. COMPARISON:  None. FINDINGS:  Contralateral Common Femoral Vein: Respiratory phasicity is normal and symmetric with the symptomatic side. No evidence of thrombus. Normal compressibility. Common Femoral Vein: No evidence of thrombus. Normal compressibility, respiratory phasicity and response to augmentation. Saphenofemoral Junction: No evidence of thrombus. Normal compressibility and flow on color Doppler imaging. Profunda Femoral Vein: No evidence of thrombus. Normal compressibility and flow on color Doppler imaging. Femoral Vein: No evidence of thrombus. Normal compressibility, respiratory phasicity and response to augmentation. Popliteal Vein: No evidence of thrombus. Normal compressibility, respiratory phasicity and response to augmentation. Calf Veins: No evidence of thrombus. Normal compressibility and flow on color Doppler imaging. Superficial Great Saphenous Vein: No evidence of thrombus. Normal compressibility and flow on color Doppler imaging. Other Findings: Heterogeneously hyperechoic soft tissue focus in the medial posterior thigh at the knee measures 1.9 cm x 1.7 cm x 1.9 cm. No fluid or internal color flow. IMPRESSION: Sonographic survey of the left lower extremity negative for DVT. There is a heterogeneously hyperechoic focus of soft tissue at the medial distal thigh/knee which is nonspecific though may represent lipoma or focal fat necrosis/trauma. Electronically Signed   By: Corrie Mckusick D.O.   On: 03/19/2018 10:42   Dg Knee Complete 4 Views Left  Result Date: 03/19/2018 CLINICAL DATA:  Painful lump in leg. EXAM: LEFT KNEE - COMPLETE 4+ VIEW COMPARISON:  Radiographs of October 17, 2017. FINDINGS: Status post left total knee arthroplasty. The femoral and tibial components appear to be well situated. No fracture or dislocation is noted. No soft tissue abnormality is noted. IMPRESSION: Status post left total knee arthroplasty. No acute abnormality is noted. Electronically Signed   By: Marijo Conception, M.D.   On: 03/19/2018 09:36     Procedures Procedures (including critical care time)  Medications Ordered in ED Medications  oxyCODONE-acetaminophen (PERCOCET/ROXICET) 5-325 MG per tablet 1 tablet (has no administration in time range)     Initial Impression / Assessment and Plan / ED Course  I have reviewed the triage vital signs and the nursing notes.  Pertinent labs & imaging results that were available during my care of the patient were reviewed by me and considered in my medical decision making (see chart for details).     X-ray and ultrasound reassuring.  Focal area of tenderness likely related to postop fat necrosis.  Neurovascularly intact.  Patient appears appropriate for discharge home, agrees to warm compresses, NSAIDs and close follow-up with her orthopedic provider.  Final Clinical Impressions(s) /  ED Diagnoses   Final diagnoses:  Post-operative pain  Acute pain of left knee    ED Discharge Orders    None       Kem Parkinson, PA-C 03/19/18 1101    Fredia Sorrow, MD 03/19/18 1705

## 2018-03-19 NOTE — Discharge Instructions (Addendum)
Wear the knee sleeve as needed for support but do not wear continuously or at bedtime.  Warm compresses may help.  Call Dr. Ruthe Mannan office to arrange a follow-up appointment

## 2018-04-02 ENCOUNTER — Ambulatory Visit: Payer: Self-pay | Admitting: Orthopedic Surgery

## 2018-04-09 ENCOUNTER — Ambulatory Visit: Payer: Self-pay | Admitting: Orthopedic Surgery

## 2019-01-01 ENCOUNTER — Emergency Department (HOSPITAL_COMMUNITY): Payer: 59

## 2019-01-01 ENCOUNTER — Other Ambulatory Visit: Payer: Self-pay

## 2019-01-01 ENCOUNTER — Emergency Department (HOSPITAL_COMMUNITY)
Admission: EM | Admit: 2019-01-01 | Discharge: 2019-01-02 | Disposition: A | Discharge status: Home / Self Care | Payer: 59 | Attending: Emergency Medicine | Admitting: Emergency Medicine

## 2019-01-01 ENCOUNTER — Encounter (HOSPITAL_COMMUNITY): Payer: Self-pay | Admitting: Emergency Medicine

## 2019-01-01 DIAGNOSIS — B349 Viral infection, unspecified: Secondary | ICD-10-CM | POA: Insufficient documentation

## 2019-01-01 DIAGNOSIS — E119 Type 2 diabetes mellitus without complications: Secondary | ICD-10-CM | POA: Insufficient documentation

## 2019-01-01 DIAGNOSIS — F17210 Nicotine dependence, cigarettes, uncomplicated: Secondary | ICD-10-CM | POA: Diagnosis not present

## 2019-01-01 DIAGNOSIS — J029 Acute pharyngitis, unspecified: Secondary | ICD-10-CM

## 2019-01-01 DIAGNOSIS — Z96652 Presence of left artificial knee joint: Secondary | ICD-10-CM | POA: Insufficient documentation

## 2019-01-01 DIAGNOSIS — Z20828 Contact with and (suspected) exposure to other viral communicable diseases: Secondary | ICD-10-CM | POA: Diagnosis not present

## 2019-01-01 DIAGNOSIS — I10 Essential (primary) hypertension: Secondary | ICD-10-CM | POA: Insufficient documentation

## 2019-01-01 DIAGNOSIS — Z79899 Other long term (current) drug therapy: Secondary | ICD-10-CM | POA: Insufficient documentation

## 2019-01-01 DIAGNOSIS — R05 Cough: Secondary | ICD-10-CM

## 2019-01-01 DIAGNOSIS — R059 Cough, unspecified: Secondary | ICD-10-CM

## 2019-01-01 NOTE — ED Provider Notes (Signed)
[facility] EMERGENCY DEPARTMENT Provider Note   CSN: [id number] Arrival date & time: 01/01/19  1935     History   Chief Complaint Chief Complaint  Patient presents with  . Possible Covid    HPI [name] is a 48 y.o. female.     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  She is concerned about coronavirus.  States she has had fatigue, sore throat, body aches for the past 2 days.  No documented fever.  States she has pain to her throat, head, diffuse in her body with nausea several episodes of diarrhea today times about 3 or 4.  Previous colectomy in the past due to diverticulitis.  No abdominal pain.  No chest pain or headache.  States she had a negative coronavirus test on September 3.  Last took Tylenol ibuprofen at 7:00 tonight.  Denies working around any coronavirus positive patients.  She denies any chest pain or shortness of breath.  She denies any abdominal pain.  States she came in tonight because she feels fatigued and achy all over and has been having excessive diarrhea.  The history is provided by the patient.    Past Medical History:  Diagnosis Date  . Arthritis   . Carpal tunnel syndrome, bilateral   . Diabetes mellitus without complication ([place])    no meds; diet controlled  . Diverticulitis   . Hypertension   . PONV (postoperative nausea and vomiting)   . Sciatica     Patient Active Problem List   Diagnosis Date Noted  . 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 Chondral defect of condyle of right femur   . S/P left knee arthroscopy 04/27/17 03/28/2017  . 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 Abdominal pain 02/17/2015  . Nausea without vomiting 02/17/2015  . Diverticulitis of intestine with abscess 01/08/2015  . Acute diverticulitis 01/08/2015  . Nausea 12/31/2014  . Diverticulitis 12/31/2014  . Hyperglycemia 12/31/2014  . Medication reaction 12/31/2014  .  Diverticulitis of large intestine without perforation or abscess without bleeding   . Thrush, oral   . Fatty liver 03/11/2014  . Family hx of colon cancer requiring screening colonoscopy 03/11/2014  . Rt flank pain 03/11/2014  . Ulnar nerve compression 06/14/2011  . 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 LOW BACK PAIN 11/07/2007  . SCIATICA 11/07/2007  . LUMBAR SPASM 11/07/2007    Past Surgical History:  Procedure Laterality Date  . ablasion of uterus    . CARPAL TUNNEL RELEASE  11/10/2010   Procedure: CARPAL TUNNEL RELEASE;  Surgeon: Arther Abbott, MD;  Location: [facility];  Service: Orthopedics;  Laterality: Right;  . CHOLECYSTECTOMY     APH  . COLON RESECTION N/A 04/08/2015   Procedure: LAPAROSCOPIC HAND ASSISTED PARTIAL COLECTOMY  CONVERTED TO OPEN AT [id number];  Surgeon: Aviva Signs, MD;  Location: [facility];  Service: General;  Laterality: N/A;  . COLONOSCOPY N/A 03/28/2014   Procedure: COLONOSCOPY;  Surgeon: Rogene Houston, MD;  Location: [facility];  Service: Endoscopy;  Laterality: N/A;  730  . KNEE ARTHROSCOPY WITH DRILLING/MICROFRACTURE Left 10/20/2016   Procedure: LEFT KNEE ARTHROSCOPY WITH MICROFRACTURE;  Surgeon: Carole Civil, MD;  Location: [facility];  Service: Orthopedics;  Laterality: Left;  . KNEE ARTHROSCOPY WITH DRILLING/MICROFRACTURE Left 04/27/2017   Procedure: KNEE ARTHROSCOPY WITH DRILLING/MICROFRACTURE;  Surgeon: Carole Civil, MD;  Location: [facility];  Service: Orthopedics;  Laterality: Left;  . KNEE ARTHROSCOPY WITH OSTEOCHONDRAL DEFECT REPAIR Left 04/27/2017   Procedure: KNEE ARTHROSCOPY WITH OSTEOCHONDRAL DEFECT REPAIR Autograft;  Surgeon: Carole Civil, MD;  Location: [facility];  Service: Orthopedics;  Laterality: Left;  . KNEE SURGERY    . PARTIAL COLECTOMY N/A 04/08/2015   Procedure: PARTIAL COLECTOMY;  Surgeon: Aviva Signs, MD;  Location: [facility];  Service: General;  Laterality: N/A;  . right knee arthroscopy    . TOTAL KNEE ARTHROPLASTY  Left 10/17/2017   Procedure: TOTAL KNEE ARTHROPLASTY;  Surgeon: Carole Civil, MD;  Location: [facility];  Service: Orthopedics;  Laterality: Left;  DePuy   . TUBAL LIGATION       OB History   No obstetric history on file.      Home Medications    Prior to Admission medications   Medication Sig Start Date End Date Taking? Authorizing Provider  gabapentin (NEURONTIN) 300 MG capsule Take 1 capsule (300 mg total) by mouth 3 (three) times daily. 12/11/17   Carole Civil, MD  ibuprofen (ADVIL,MOTRIN) 200 MG tablet Take 800 mg by mouth every 6 (six) hours as needed.    [provider]  methocarbamol (ROBAXIN) 500 MG tablet Take 1 tablet (500 mg total) by mouth every 6 (six) hours as needed for muscle spasms. Patient not taking: Reported on 01/08/2018 11/14/17   Carole Civil, MD  oxyCODONE-acetaminophen (PERCOCET/ROXICET) 5-325 MG tablet Take 1 tablet by mouth every 4 (four) hours as needed for severe pain. 02/02/18   Carole Civil, MD  promethazine (PHENERGAN) 12.5 MG tablet Take 1 tablet (12.5 mg total) by mouth every 6 (six) hours as needed for nausea or vomiting. Patient not taking: Reported on 01/08/2018 11/29/17   Sanjuana Kava, MD    Family History Family History  Problem Relation Age of Onset  . Diabetes type I Mother   . Hyperlipidemia Mother   . Diabetes type II Father   . Hypertension Father   . Hyperlipidemia Father   . Diabetes type II Brother   . Diabetes type II Brother   . Cancer Other   . Diabetes Other   . Arthritis Other   . Asthma Other   . Anesthesia problems Neg Hx     Social History Social History   Tobacco Use  . Smoking status: Current Every Day Smoker    Packs/day: 1.00    Years: 26.00    Pack years: 26.00    Types: Cigarettes  . Smokeless tobacco: Never Used  Substance Use Topics  . Alcohol use: No    Alcohol/week: 0.0 standard drinks  . Drug use: No     Allergies   Flagyl [metronidazole] and Hydrocodone    Review of Systems Review of Systems  Constitutional: Positive for activity change, appetite change, chills and fatigue. Negative for fever.  HENT: Positive for congestion and sore throat.   Eyes: Negative for visual disturbance.  Respiratory: Positive for cough. Negative for shortness of breath.   Cardiovascular: Negative for chest pain.  Gastrointestinal: Positive for diarrhea and nausea. Negative for abdominal pain and vomiting.  Genitourinary: Negative for dysuria, flank pain and hematuria.  Musculoskeletal: Negative for arthralgias and myalgias.  Neurological: Positive for weakness and light-headedness.   all other systems are negative except as noted in the HPI and PMH.     Physical Exam Updated Vital Signs BP (!) 163/91 (BP Location: Right Arm)   Pulse 70   Temp 98.7 F (37.1 C) (Oral)  Resp 20   Ht 5\' 5"  (1.651 m)   Wt 83.9 kg   SpO2 96%   BMI 30.79 kg/m   Physical Exam Vitals signs and nursing note reviewed.  Constitutional:      General: She is not in acute distress.    Appearance: She is well-developed.     Comments: No distress speaking full sentences  HENT:     Head: Normocephalic and atraumatic.     Right Ear: Tympanic membrane normal.     Left Ear: Tympanic membrane normal.     Mouth/Throat:     Mouth: Mucous membranes are moist.     Pharynx: Posterior oropharyngeal erythema present. No oropharyngeal exudate.     Comments: Erythematous oropharynx, no asymmetry, uvula midline Eyes:     Conjunctiva/sclera: Conjunctivae normal.     Pupils: Pupils are equal, round, and reactive to light.  Neck:     Musculoskeletal: Normal range of motion and neck supple.     Comments: No meningismus. Cardiovascular:     Rate and Rhythm: Normal rate and regular rhythm.     Heart sounds: Normal heart sounds. No murmur.  Pulmonary:     Effort: Pulmonary effort is normal. No respiratory distress.     Breath sounds: Normal breath sounds.  Abdominal:     Palpations:  Abdomen is soft.     Tenderness: There is no abdominal tenderness. There is no guarding or rebound.  Musculoskeletal: Normal range of motion.        General: No tenderness.  Skin:    General: Skin is warm.     Capillary Refill: Capillary refill takes less than 2 seconds.  Neurological:     General: No focal deficit present.     Mental Status: She is alert and oriented to person, place, and time. Mental status is at baseline.     Cranial Nerves: No cranial nerve deficit.     Motor: No abnormal muscle tone.     Coordination: Coordination normal.     Comments: No ataxia on finger to nose bilaterally. No pronator drift. 5/5 strength throughout. CN 2-12 intact.Equal grip strength. Sensation intact.   Psychiatric:        Behavior: Behavior normal.      ED Treatments / Results  Labs (all labs ordered are listed, but only abnormal results are displayed) Labs Reviewed  CBC WITH DIFFERENTIAL/PLATELET - Abnormal; Notable for the following components:      Result Value   WBC 15.4 (*)    HCT 46.3 (*)    Neutro Abs 8.2 (*)    Lymphs Abs 5.7 (*)    Eosinophils Absolute 0.6 (*)    All other components within normal limits  COMPREHENSIVE METABOLIC PANEL - Abnormal; Notable for the following components:   Glucose, Bld 102 (*)    ALT 49 (*)    All other components within normal limits  SARS CORONAVIRUS 2 (HOSPITAL ORDER, [place] [facility])  GROUP A STREP BY PCR  LIPASE, BLOOD  URINALYSIS, ROUTINE W REFLEX MICROSCOPIC  PREGNANCY, URINE    EKG None  Radiology Dg Chest Port 1 View  Result Date: 01/01/2019 CLINICAL DATA:  Cough EXAM: PORTABLE CHEST 1 VIEW COMPARISON:  12/21/2015 FINDINGS: Mild peribronchial thickening. Heart and mediastinal contours are within normal limits. No focal opacities or effusions. No acute bony abnormality. IMPRESSION: Mild bronchitic changes. Electronically Signed   By: Rolm Baptise M.D.   On: 01/01/2019 23:34    Procedures Procedures  (including critical care  time)  Medications Ordered in ED Medications - No data to display   Initial Impression / Assessment and Plan / ED Course  I have reviewed the triage vital signs and the nursing notes.  Pertinent labs & imaging results that were available during my care of the patient were reviewed by me and considered in my medical decision making (see chart for details).        Patient with 2 days of body aches, cough, sore throat, diarrhea, nausea and concern for coronavirus.  No distress, no hypoxia  Labs show leukocytosis which appears to be chronic.  Chest x-ray is negative.  Urinalysis is negative.  Rapid strep is negative.  No vomiting or diarrhea throughout ED course.  Abdomen soft and nontender.  She denies chest pain or abdominal pain.  Her main concern is throat pain.  Reassured her that there is no evidence of peritonsillar abscess. Strep is negative. Coronavirus test is negative.  Offered soft tissue neck x-ray for further assessment of her throat discomfort but she declines.  She has no difficulty with controlling secretions or speaking. She is able to ambulate without desaturation and is tolerating PO.   Advised she could still have coronavirus despite negative test.  Advised quarantine, p.o. fluids, Tylenol, ibuprofen, follow-up with PCP. Return to the ED if she develops any difficulty breathing, difficulty swallowing, or any other concerns.   [name] was evaluated in Emergency Department on 01/01/2019 for the symptoms described in the history of present illness. She was evaluated in the context of the global COVID-19 pandemic, which necessitated consideration that the patient might be at risk for infection with the SARS-CoV-2 virus that causes COVID-19. Institutional protocols and algorithms that pertain to the evaluation of patients at risk for COVID-19 are in a state of rapid change based on information released by regulatory bodies including the CDC and  federal and state organizations. These policies and algorithms were followed during the patient's care in the ED.  Final Clinical Impressions(s) / ED Diagnoses   Final diagnoses:  Viral syndrome  Pharyngitis, unspecified etiology    ED Discharge Orders    None       Maeson Purohit, Annie Main, MD 01/02/19 [phone]

## 2019-01-01 NOTE — ED Triage Notes (Addendum)
Patient complaining of sore throat, nausea, diarrhea, fatigue, and cough x 2 days. 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 States she was last tested on Thursday and was negative but had no symptoms at that time. States she took tylenol and ibuprofen at [employer].

## 2019-01-02 ENCOUNTER — Other Ambulatory Visit: Payer: Self-pay

## 2019-01-02 LAB — CBC WITH DIFFERENTIAL/PLATELET
Abs Immature Granulocytes: 0.07 10*3/uL (ref 0.00–0.07)
Basophils Absolute: 0.1 10*3/uL (ref 0.0–0.1)
Basophils Relative: 1 %
Eosinophils Absolute: 0.6 10*3/uL — ABNORMAL HIGH (ref 0.0–0.5)
Eosinophils Relative: 4 %
HCT: 46.3 % — ABNORMAL HIGH (ref 36.0–46.0)
Hemoglobin: 14.8 g/dL (ref 12.0–15.0)
Immature Granulocytes: 1 %
Lymphocytes Relative: 37 %
Lymphs Abs: 5.7 10*3/uL — ABNORMAL HIGH (ref 0.7–4.0)
MCH: 29.2 pg (ref 26.0–34.0)
MCHC: 32 g/dL (ref 30.0–36.0)
MCV: 91.5 fL (ref 80.0–100.0)
Monocytes Absolute: 0.8 10*3/uL (ref 0.1–1.0)
Monocytes Relative: 5 %
Neutro Abs: 8.2 10*3/uL — ABNORMAL HIGH (ref 1.7–7.7)
Neutrophils Relative %: 52 %
Platelets: 354 10*3/uL (ref 150–400)
RBC: 5.06 MIL/uL (ref 3.87–5.11)
RDW: 14.2 % (ref 11.5–15.5)
WBC: 15.4 10*3/uL — ABNORMAL HIGH (ref 4.0–10.5)
nRBC: 0 % (ref 0.0–0.2)

## 2019-01-02 LAB — URINALYSIS, ROUTINE W REFLEX MICROSCOPIC
Bilirubin Urine: NEGATIVE
Glucose, UA: NEGATIVE mg/dL
Hgb urine dipstick: NEGATIVE
Ketones, ur: NEGATIVE mg/dL
Leukocytes,Ua: NEGATIVE
Nitrite: NEGATIVE
Protein, ur: NEGATIVE mg/dL
Specific Gravity, Urine: 1.014 (ref 1.005–1.030)
pH: 5 (ref 5.0–8.0)

## 2019-01-02 LAB — PREGNANCY, URINE: Preg Test, Ur: NEGATIVE

## 2019-01-02 LAB — COMPREHENSIVE METABOLIC PANEL
ALT: 49 U/L — ABNORMAL HIGH (ref 0–44)
AST: 26 U/L (ref 15–41)
Albumin: 4.4 g/dL (ref 3.5–5.0)
Alkaline Phosphatase: 83 U/L (ref 38–126)
Anion gap: 10 (ref 5–15)
BUN: 16 mg/dL (ref 6–20)
CO2: 22 mmol/L (ref 22–32)
Calcium: 9 mg/dL (ref 8.9–10.3)
Chloride: 103 mmol/L (ref 98–111)
Creatinine, Ser: 0.56 mg/dL (ref 0.44–1.00)
GFR calc Af Amer: >60 mL/min (ref >60)
GFR calc non Af Amer: >60 mL/min (ref >60)
Glucose, Bld: 102 mg/dL — ABNORMAL HIGH (ref 70–99)
Potassium: 3.6 mmol/L (ref 3.5–5.1)
Sodium: 135 mmol/L (ref 135–145)
Total Bilirubin: 0.6 mg/dL (ref 0.3–1.2)
Total Protein: 7.2 g/dL (ref 6.5–8.1)

## 2019-01-02 LAB — GROUP A STREP BY PCR: Group A Strep by PCR: NOT DETECTED

## 2019-01-02 LAB — SARS CORONAVIRUS 2 BY RT PCR (HOSPITAL ORDER, PERFORMED IN ~~LOC~~ HOSPITAL LAB): SARS Coronavirus 2: NEGATIVE

## 2019-01-02 LAB — LIPASE, BLOOD: Lipase: 27 U/L (ref 11–51)

## 2019-01-02 NOTE — ED Notes (Signed)
Patient is resting comfortably. 

## 2019-01-02 NOTE — ED Notes (Signed)
Pt given ginger ale to drink. 

## 2019-01-02 NOTE — Discharge Instructions (Signed)
Coronavirus test is negative but as we discussed is not 100% sensitive.  Keep yourself quarantined until you are feeling better.  Yourself hydrated at home and use Tylenol and ibuprofen as needed for aches and fever. You declined x-ray of your neck today.  Return to the ED if you develop difficulty breathing ,difficulty swallowing or other concerns.

## 2019-01-02 NOTE — ED Notes (Signed)
Pt given gingerale, but did not drink it.

## 2019-01-02 NOTE — ED Notes (Signed)
Pt ambulated with no difficulty. No SOB reported or observed. Pt O2 sats were 99% on room air. HR in the 70's. Dr Wyvonnia Dusky aware.

## 2019-03-20 ENCOUNTER — Encounter (INDEPENDENT_AMBULATORY_CARE_PROVIDER_SITE_OTHER): Payer: Self-pay | Admitting: *Deleted

## 2019-06-12 IMAGING — CR DG KNEE 1-2V PORT*L*
1 series · 2 of 2 positions shown · non-contrast
Comparison: 08/29/2017 plain film exam.

CLINICAL DATA: 46-year-old female post knee replacement. Initial
encounter.

EXAM:
PORTABLE LEFT KNEE - 1-2 VIEW

[Series 1: ap · 0.17mm/px · 2 of 2 slices shown]
[im 1/2]
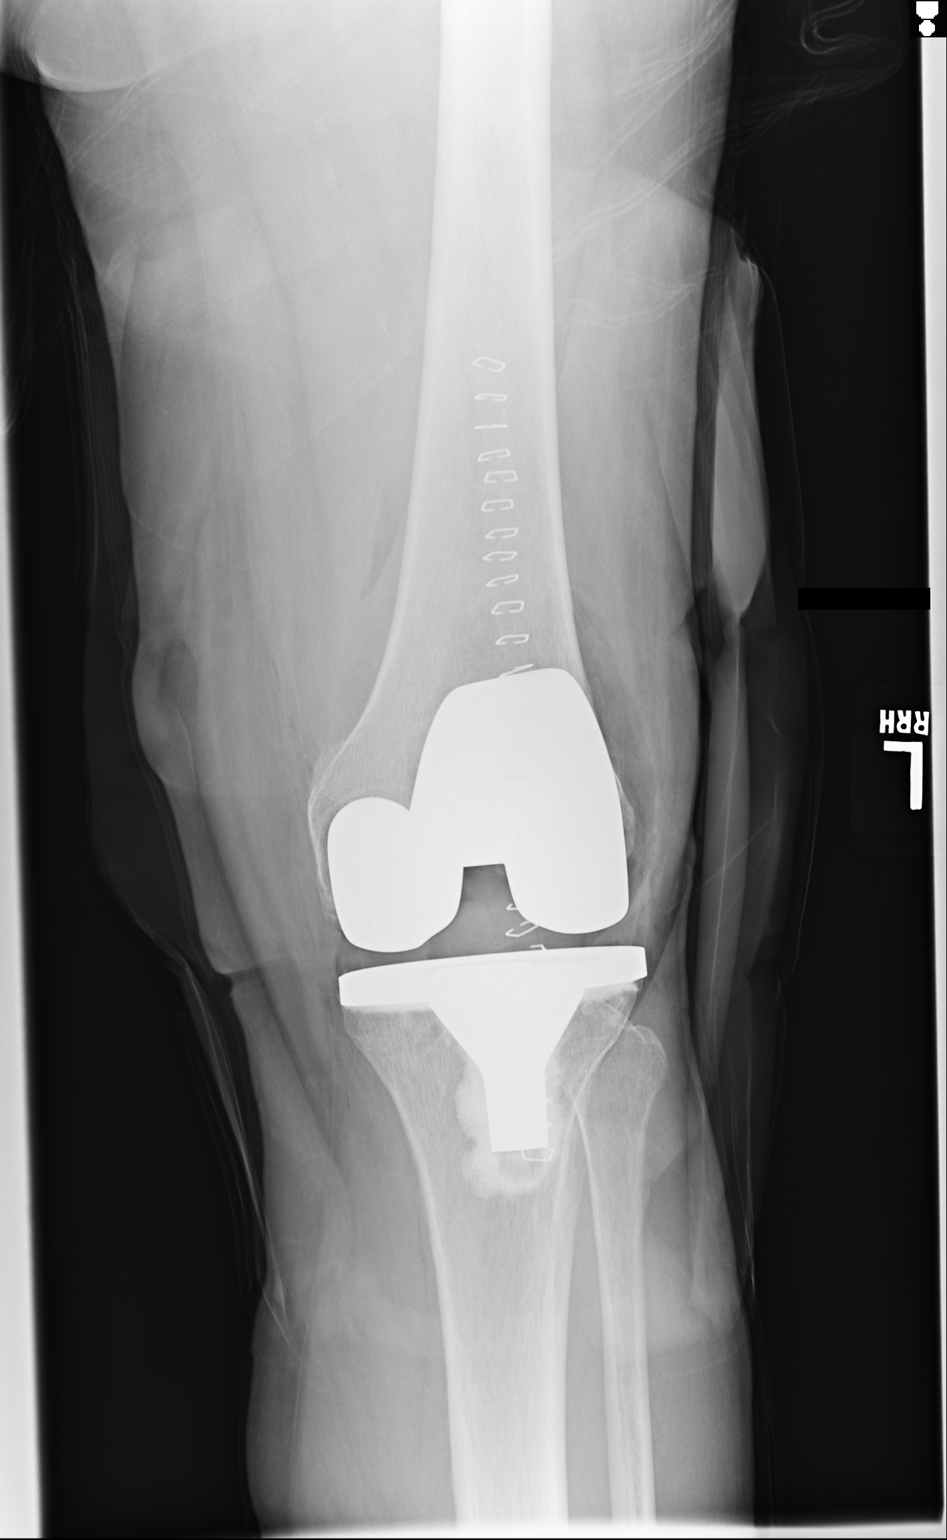
[im 2/2]
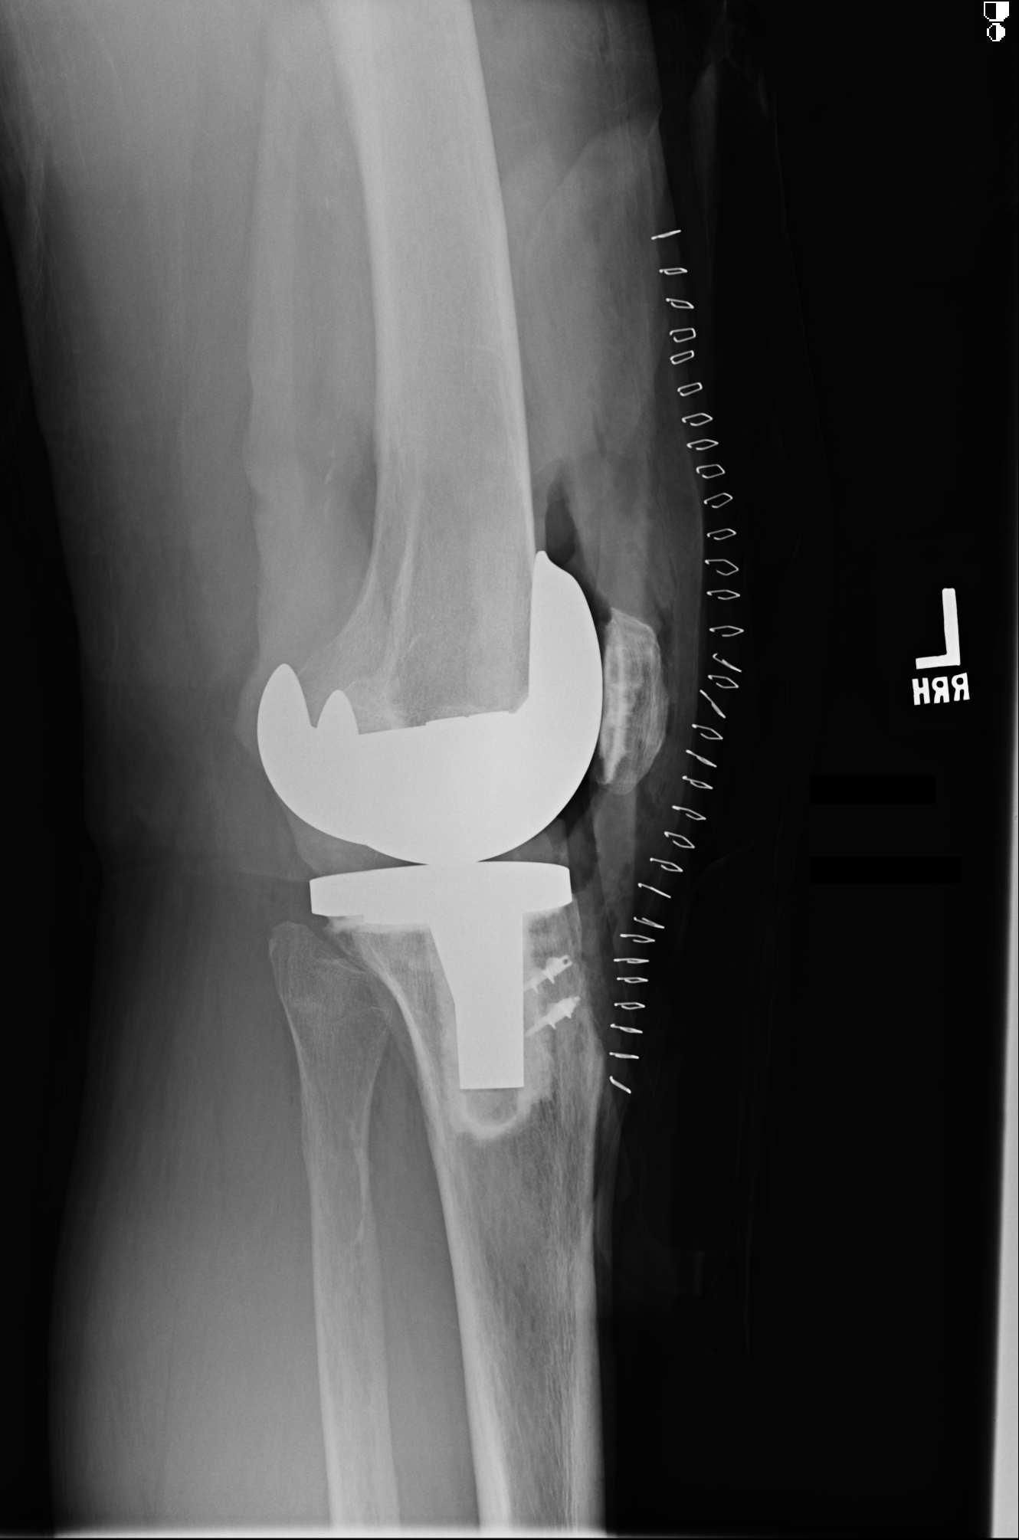

[2 of 2 positions shown; findings below may reference images not displayed]

FINDINGS: Post total left knee replacement which appears in satisfactory
position without complication noted. Screws proximal tibia may be
related to tibial tendon insertion.
IMPRESSION: Post total left knee replacement.

## 2019-06-21 ENCOUNTER — Emergency Department (HOSPITAL_COMMUNITY)
Admission: EM | Admit: 2019-06-21 | Discharge: 2019-06-21 | Disposition: A | Discharge status: Home / Self Care | Payer: 59 | Attending: Emergency Medicine | Admitting: Emergency Medicine

## 2019-06-21 ENCOUNTER — Emergency Department (HOSPITAL_COMMUNITY): Payer: 59

## 2019-06-21 ENCOUNTER — Encounter (HOSPITAL_COMMUNITY): Payer: Self-pay | Admitting: *Deleted

## 2019-06-21 ENCOUNTER — Other Ambulatory Visit: Payer: Self-pay

## 2019-06-21 DIAGNOSIS — R739 Hyperglycemia, unspecified: Secondary | ICD-10-CM

## 2019-06-21 DIAGNOSIS — R079 Chest pain, unspecified: Secondary | ICD-10-CM | POA: Diagnosis present

## 2019-06-21 DIAGNOSIS — I10 Essential (primary) hypertension: Secondary | ICD-10-CM | POA: Diagnosis not present

## 2019-06-21 DIAGNOSIS — R000 Tachycardia, unspecified: Secondary | ICD-10-CM

## 2019-06-21 DIAGNOSIS — E876 Hypokalemia: Secondary | ICD-10-CM

## 2019-06-21 DIAGNOSIS — F17210 Nicotine dependence, cigarettes, uncomplicated: Secondary | ICD-10-CM | POA: Insufficient documentation

## 2019-06-21 DIAGNOSIS — E1165 Type 2 diabetes mellitus with hyperglycemia: Secondary | ICD-10-CM | POA: Insufficient documentation

## 2019-06-21 LAB — BASIC METABOLIC PANEL
Anion gap: 14 (ref 5–15)
BUN: 18 mg/dL (ref 6–20)
CO2: 22 mmol/L (ref 22–32)
Calcium: 9.2 mg/dL (ref 8.9–10.3)
Chloride: 101 mmol/L (ref 98–111)
Creatinine, Ser: 1 mg/dL (ref 0.44–1.00)
GFR calc Af Amer: >60 mL/min (ref >60)
GFR calc non Af Amer: >60 mL/min (ref >60)
Glucose, Bld: 262 mg/dL — ABNORMAL HIGH (ref 70–99)
Potassium: 3 mmol/L — ABNORMAL LOW (ref 3.5–5.1)
Sodium: 137 mmol/L (ref 135–145)

## 2019-06-21 LAB — CBC WITH DIFFERENTIAL/PLATELET
Abs Immature Granulocytes: 0.06 K/uL (ref 0.00–0.07)
Basophils Absolute: 0.2 K/uL — ABNORMAL HIGH (ref 0.0–0.1)
Basophils Relative: 1 %
Eosinophils Absolute: 0.7 K/uL — ABNORMAL HIGH (ref 0.0–0.5)
Eosinophils Relative: 3 %
HCT: 46.3 % — ABNORMAL HIGH (ref 36.0–46.0)
Hemoglobin: 15.3 g/dL — ABNORMAL HIGH (ref 12.0–15.0)
Immature Granulocytes: 0 %
Lymphocytes Relative: 47 %
Lymphs Abs: 9.4 K/uL — ABNORMAL HIGH (ref 0.7–4.0)
MCH: 29.8 pg (ref 26.0–34.0)
MCHC: 33 g/dL (ref 30.0–36.0)
MCV: 90.3 fL (ref 80.0–100.0)
Monocytes Absolute: 1 K/uL (ref 0.1–1.0)
Monocytes Relative: 5 %
Neutro Abs: 8.8 K/uL — ABNORMAL HIGH (ref 1.7–7.7)
Neutrophils Relative %: 44 %
Platelets: 386 K/uL (ref 150–400)
RBC: 5.13 MIL/uL — ABNORMAL HIGH (ref 3.87–5.11)
RDW: 14.4 % (ref 11.5–15.5)
WBC: 20 K/uL — ABNORMAL HIGH (ref 4.0–10.5)
nRBC: 0 % (ref 0.0–0.2)

## 2019-06-21 LAB — TROPONIN I (HIGH SENSITIVITY): Troponin I (High Sensitivity): 4 ng/L (ref <18)

## 2019-06-21 LAB — TSH: TSH: 1.152 u[IU]/mL (ref 0.350–4.500)

## 2019-06-21 MED ORDER — POTASSIUM CHLORIDE CRYS ER 20 MEQ PO TBCR
40.0000 meq | EXTENDED_RELEASE_TABLET | Freq: Once | ORAL | Status: AC
  Administered 2019-06-21: 40 meq via ORAL
  Filled 2019-06-21: qty 2

## 2019-06-21 MED ORDER — METOPROLOL TARTRATE 5 MG/5ML IV SOLN
INTRAVENOUS | Status: AC
  Filled 2019-06-21: qty 5

## 2019-06-21 MED ORDER — METOPROLOL TARTRATE 25 MG PO TABS
25.0000 mg | ORAL_TABLET | Freq: Once | ORAL | Status: AC
  Administered 2019-06-21: 23:00:00 25 mg via ORAL
  Filled 2019-06-21: qty 1

## 2019-06-21 MED ORDER — IOHEXOL 350 MG/ML SOLN
100.0000 mL | Freq: Once | INTRAVENOUS | Status: AC | PRN
  Administered 2019-06-21: 23:00:00 100 mL via INTRAVENOUS

## 2019-06-21 MED ORDER — POTASSIUM CHLORIDE ER 10 MEQ PO TBCR: 10.0000 meq | EXTENDED_RELEASE_TABLET | Freq: Every day | ORAL | 0 refills | Status: DC

## 2019-06-21 MED ORDER — METOPROLOL TARTRATE 50 MG PO TABS: 25.0000 mg | ORAL_TABLET | Freq: Two times a day (BID) | ORAL | 1 refills | Status: DC

## 2019-06-21 MED ORDER — METOPROLOL TARTRATE 5 MG/5ML IV SOLN
5.0000 mg | Freq: Once | INTRAVENOUS | Status: AC
  Administered 2019-06-21: 5 mg via INTRAVENOUS

## 2019-06-21 MED ORDER — SODIUM CHLORIDE 0.9 % IV SOLN: INTRAVENOUS | Status: DC

## 2019-06-21 NOTE — ED Notes (Signed)
Upon pt being put on monitor pts HR in 170s, charge RN made EDP aware, crash cart to bedside, pads placed on pt, EDP @ bedside doing vagal maneuvers, EKG captured, IV placed & blood work obtained. See MAR for medication intervention.

## 2019-06-21 NOTE — Discharge Instructions (Signed)
Your blood work showed low potassium, we have given you replacement potassium as well as for the next 5 days at home Your blood work also showed an elevated white blood cell count at 20,000 which needs to be rechecked by your doctor within 2 weeks Your heart rate was very fast as high as 170 but came back to normal.  You will need to see the cardiologist, I have given you their phone number above.  Until that time please take metoprolol twice a day, half of a tablet each dose.  If you should develop increasing shortness of breath palpitations or feel like your heart rate is too slow or you are going to pass out please come back to the emergency department immediately.

## 2019-06-21 NOTE — ED Provider Notes (Signed)
[facility] EMERGENCY DEPARTMENT Provider Note   CSN: [id number] Arrival date & time: 06/21/19  2053     History Chief Complaint  Patient presents with  . Chest Pain    [name] is a 49 y.o. female.  HPI   presents with acute onset of palpitations which occurred after dinner when she was laying down on the bed - denies alcohol, denies drugs, she does smoke tobacco - 1.5 / ppd.  She has no hx of weight change, no bowel problems / urinary sx - she has no CP, but had SOB that was assocaited with it - arrives from home with HR of 160 - narrow complex.  The symptoms are persistent, severe, associated with shaking all over and tremor  The patient denies any prior cardiac history, she has no history of hyperthyroidism, drug abuse or over-the-counter medications, she takes melatonin at night, states she was a borderline diabetic but does not take any medicines for this.  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 Arthritis   . Carpal tunnel syndrome, bilateral   . Diabetes mellitus without complication ([place])    no meds; diet controlled  . Diverticulitis   . Hypertension   . PONV (postoperative nausea and vomiting)   . Sciatica     Patient Active Problem List   Diagnosis Date Noted  . 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 Chondral defect of condyle of right femur   . S/P left knee arthroscopy 04/27/17 03/28/2017  . 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 Abdominal pain 02/17/2015  . Nausea without vomiting 02/17/2015  . Diverticulitis of intestine with abscess 01/08/2015  . Acute diverticulitis 01/08/2015  . Nausea 12/31/2014  . Diverticulitis 12/31/2014  . Hyperglycemia 12/31/2014  . Medication reaction 12/31/2014  . Diverticulitis of large intestine without perforation or abscess  without bleeding   . Thrush, oral   . Fatty liver 03/11/2014  . Family hx of colon cancer requiring screening colonoscopy 03/11/2014  . Rt flank pain 03/11/2014  . Ulnar nerve compression 06/14/2011  . 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 LOW BACK PAIN 11/07/2007  . SCIATICA 11/07/2007  . LUMBAR SPASM 11/07/2007    Past Surgical History:  Procedure Laterality Date  . ablasion of uterus    . CARPAL TUNNEL RELEASE  11/10/2010   Procedure: CARPAL TUNNEL RELEASE;  Surgeon: Arther Abbott, MD;  Location: [facility];  Service: Orthopedics;  Laterality: Right;  . CHOLECYSTECTOMY     [facility]  . COLON RESECTION N/A 04/08/2015   Procedure: LAPAROSCOPIC HAND ASSISTED PARTIAL COLECTOMY  CONVERTED TO OPEN AT [id number];  Surgeon: Aviva Signs, MD;  Location: [facility];  Service: General;  Laterality: N/A;  . COLONOSCOPY N/A 03/28/2014   Procedure: COLONOSCOPY;  Surgeon: Rogene Houston, MD;  Location: [facility];  Service: Endoscopy;  Laterality: N/A;  730  . KNEE ARTHROSCOPY WITH DRILLING/MICROFRACTURE Left 10/20/2016   Procedure: LEFT KNEE ARTHROSCOPY WITH MICROFRACTURE;  Surgeon: Carole Civil, MD;  Location: [facility];  Service: Orthopedics;  Laterality: Left;  . KNEE ARTHROSCOPY WITH DRILLING/MICROFRACTURE Left 04/27/2017   Procedure: KNEE ARTHROSCOPY WITH DRILLING/MICROFRACTURE;  Surgeon: Carole Civil, MD;  Location: [facility];  Service: Orthopedics;  Laterality: Left;  . KNEE ARTHROSCOPY WITH OSTEOCHONDRAL DEFECT REPAIR Left 04/27/2017  Procedure: KNEE ARTHROSCOPY WITH OSTEOCHONDRAL DEFECT REPAIR Autograft;  Surgeon: Carole Civil, MD;  Location: [facility];  Service: Orthopedics;  Laterality: Left;  . KNEE SURGERY    . PARTIAL COLECTOMY N/A 04/08/2015   Procedure: PARTIAL COLECTOMY;  Surgeon: Aviva Signs, MD;  Location: [facility];  Service: General;  Laterality: N/A;  . right knee arthroscopy    . TOTAL KNEE ARTHROPLASTY Left 10/17/2017   Procedure: TOTAL KNEE ARTHROPLASTY;  Surgeon:  Carole Civil, MD;  Location: [facility];  Service: Orthopedics;  Laterality: Left;  [place]   . TUBAL LIGATION       OB History   No obstetric history on file.     Family History  Problem Relation Age of Onset  . Diabetes type I Mother   . Hyperlipidemia Mother   . Diabetes type II Father   . Hypertension Father   . Hyperlipidemia Father   . Diabetes type II Brother   . Diabetes type II Brother   . Cancer Other   . Diabetes Other   . Arthritis Other   . Asthma Other   . Anesthesia problems Neg Hx     Social History   Tobacco Use  . Smoking status: Current Every Day Smoker    Packs/day: 1.00    Years: 26.00    Pack years: 26.00    Types: Cigarettes  . Smokeless tobacco: Never Used  Substance Use Topics  . Alcohol use: No    Alcohol/week: 0.0 standard drinks  . Drug use: No    Home Medications Prior to Admission medications   Medication Sig Start Date End Date Taking? Authorizing Provider  gabapentin (NEURONTIN) 300 MG capsule Take 1 capsule (300 mg total) by mouth 3 (three) times daily. 12/11/17  Yes Carole Civil, MD  ibuprofen (ADVIL,MOTRIN) 200 MG tablet Take 800 mg by mouth every 6 (six) hours as needed.   Yes [provider]  metoprolol tartrate (LOPRESSOR) 50 MG tablet Take 0.5 tablets (25 mg total) by mouth 2 (two) times daily. 06/21/19   Noemi Chapel, MD  potassium chloride (KLOR-CON) 10 MEQ tablet Take 1 tablet (10 mEq total) by mouth daily. 06/21/19   Noemi Chapel, MD  promethazine (PHENERGAN) 12.5 MG tablet Take 1 tablet (12.5 mg total) by mouth every 6 (six) hours as needed for nausea or vomiting. Patient not taking: Reported on 01/08/2018 11/29/17 06/21/19  Sanjuana Kava, MD    Allergies    Flagyl [metronidazole] and Hydrocodone  Review of Systems   Review of Systems  All other systems reviewed and are negative.   Physical Exam Updated Vital Signs BP (!) 168/91   Pulse (!) 116   Resp 14   Ht 1.651 m (5\' 5" )   Wt 88.5 kg   SpO2  100%   BMI 32.45 kg/m   Physical Exam Vitals and nursing note reviewed.  Constitutional:      General: She is not in acute distress.    Appearance: She is well-developed.  HENT:     Head: Normocephalic and atraumatic.     Mouth/Throat:     Pharynx: No oropharyngeal exudate.  Eyes:     General: No scleral icterus.       Right eye: No discharge.        Left eye: No discharge.     Conjunctiva/sclera: Conjunctivae normal.     Pupils: Pupils are equal, round, and reactive to light.  Neck:     Thyroid: No thyromegaly.     Vascular:  No JVD.  Cardiovascular:     Rate and Rhythm: Regular rhythm. Tachycardia present.     Heart sounds: Normal heart sounds. No murmur. No friction rub. No gallop.      Comments: No JVD, strong pulses at the radial arteries Pulmonary:     Effort: Pulmonary effort is normal. No respiratory distress.     Breath sounds: Normal breath sounds. No wheezing or rales.  Abdominal:     General: Bowel sounds are normal. There is no distension.     Palpations: Abdomen is soft. There is no mass.     Tenderness: There is no abdominal tenderness.  Musculoskeletal:        General: No tenderness. Normal range of motion.     Cervical back: Normal range of motion and neck supple.  Lymphadenopathy:     Cervical: No cervical adenopathy.  Skin:    General: Skin is warm and dry.     Findings: No erythema or rash.  Neurological:     Mental Status: She is alert.     Coordination: Coordination normal.  Psychiatric:        Behavior: Behavior normal.     ED Results / Procedures / Treatments   Labs (all labs ordered are listed, but only abnormal results are displayed) Labs Reviewed  CBC WITH DIFFERENTIAL/PLATELET - Abnormal; Notable for the following components:      Result Value   WBC 20.0 (*)    RBC 5.13 (*)    Hemoglobin 15.3 (*)    HCT 46.3 (*)    Neutro Abs 8.8 (*)    Lymphs Abs 9.4 (*)    Eosinophils Absolute 0.7 (*)    Basophils Absolute 0.2 (*)    All  other components within normal limits  BASIC METABOLIC PANEL - Abnormal; Notable for the following components:   Potassium 3.0 (*)    Glucose, Bld 262 (*)    All other components within normal limits  TSH  TROPONIN I (HIGH SENSITIVITY)    EKG EKG Interpretation  Date/Time:  Friday June 21 2019 21:01:12 EST Ventricular Rate:  169 PR Interval:    QRS Duration: 111 QT Interval:  288 QTC Calculation: 483 R Axis:   81 Text Interpretation: Sinus tachycardia LAE, consider biatrial enlargement Anteroseptal infarct, old Repolarization abnormality, prob rate related Since last tracing rate faster Confirmed by Noemi Chapel [phone]) on 06/21/2019 9:13:45 PM   EKG Interpretation  Date/Time:  Friday June 21 2019 21:24:31 EST Ventricular Rate:  103 PR Interval:    QRS Duration: 73 QT Interval:  330 QTC Calculation: 432 R Axis:   71 Text Interpretation: Sinus tachycardia Probable left atrial enlargement Borderline T abnormalities, inferior leads Since last tracing rate slower Confirmed by Noemi Chapel [phone]) on 06/21/2019 10:04:16 PM       Radiology CT Angio Chest PE W and/or Wo Contrast  Result Date: 06/21/2019 CLINICAL DATA:  Chest pain, short of breath, elevated D dimer EXAM: CT ANGIOGRAPHY CHEST WITH CONTRAST TECHNIQUE: Multidetector CT imaging of the chest was performed using the standard protocol during bolus administration of intravenous contrast. Multiplanar CT image reconstructions and MIPs were obtained to evaluate the vascular anatomy. CONTRAST:  142mL OMNIPAQUE IOHEXOL 350 MG/ML SOLN COMPARISON:  06/21/2019 FINDINGS: Cardiovascular: This is a technically adequate evaluation of the pulmonary vasculature. No filling defects or pulmonary emboli. The heart is not enlarged. Moderate atherosclerosis within the LAD distribution of the coronary vessels. Thoracic aorta is normal in caliber without aneurysm or dissection. Mediastinum/Nodes: No pathologic mediastinal  or hilar  adenopathy. Mildly enlarged left axillary lymph nodes measure up to 16 mm in short axis, reference image 25. This is nonspecific. The thyroid is enlarged without focal abnormality. Lungs/Pleura: No airspace disease, effusion, or pneumothorax. Central airways are patent. Upper Abdomen: No acute abnormality. Musculoskeletal: No acute or destructive bony lesions. Reconstructed images demonstrate no additional findings. Review of the MIP images confirms the above findings. IMPRESSION: 1. No evidence of pulmonary embolus. 2. Prominent atherosclerosis of the LAD distribution of the coronary vasculature. 3. Nonspecific left axillary adenopathy. Electronically Signed   By: Randa Ngo M.D.   On: 06/21/2019 22:50   DG Chest Port 1 View  Result Date: 06/21/2019 CLINICAL DATA:  Arrhythmia, chest pain and shortness of breath EXAM: PORTABLE CHEST 1 VIEW COMPARISON:  01/01/2019 FINDINGS: Single frontal view of the chest demonstrates external defibrillator pads overlying left chest. Cardiac silhouette is unremarkable. No airspace disease, effusion, or pneumothorax. IMPRESSION: 1. No acute intrathoracic process. Electronically Signed   By: Randa Ngo M.D.   On: 06/21/2019 21:37    Procedures Procedures (including critical care time)  Medications Ordered in ED Medications  0.9 %  sodium chloride infusion ( Intravenous New Bag/Given 06/21/19 2113)  metoprolol tartrate (LOPRESSOR) tablet 25 mg (has no administration in time range)  potassium chloride SA (KLOR-CON) CR tablet 40 mEq (has no administration in time range)  metoprolol tartrate (LOPRESSOR) injection 5 mg (5 mg Intravenous Not Given 06/21/19 2145)  iohexol (OMNIPAQUE) 350 MG/ML injection 100 mL (100 mLs Intravenous Contrast Given 06/21/19 2243)    ED Course  I have reviewed the triage vital signs and the nursing notes.  Pertinent labs & imaging results that were available during my care of the patient were reviewed by me and considered in my medical  decision making (see chart for details).    MDM Rules/Calculators/A&P                      This patient presented with what it appeared to be either SVT or sinus tachycardia, potentially atrial flutter with 2-1 block.  Within 10 minutes she had slowed to approximately 145 bpm and P waves were seen and after 5 mg of Lopressor she came down to 110 bpm and what is clearly a sinus tachycardia.  I do not detect any signs of WPW or conduction delay, it is unclear what caused this, she will need labs and a chest x-ray, she is agreeable, we will keep her on a cardiac monitor.  11:11 PM Cardiac monitoring reveals Sinus Tachycardia at 145 bpm (Rate & rhythm), as reviewed and interpreted by me. Cardiac monitoring was ordered due to arrhythmia and to monitor patient for dysrhythmia.  This patient's heart rate has continued to improve, she is now between 101 108, it is sinus rhythm, I have repeated the EKG which confirms sinus rhythm without significant cause of her tachycardia i.e. there is no QT prolongation.  I am concerned that the patient had a white blood cell count of 20,000 which could come from her acute anxiety and tachyarrhythmia but also could come from some other underlying condition.  Given her tachycardia that came on spontaneously with her shortness of breath which she states she did have a now has still although it is much better we will get a CT angiogram to rule out pulmonary embolism.  She is agreeable to the plan  CT scan unremarkable for pulmonary embolism, she does have some coronary calcifications however this does not appear  to be associated with symptoms as she has no chest pain and at this time is back to her normal self.  She has been given oral metoprolol and potassium to supplement her low levels prior to discharge.  She is aware that she has an elevated white blood cell count blood sugar and a decreased potassium that need to be rechecked, she is stable for discharge and has been  given the phone number for cardiology and her family doctor for recheck  Final Clinical Impression(s) / ED Diagnoses Final diagnoses:  Tachycardia  Hyperglycemia  Hypokalemia    Rx / DC Orders ED Discharge Orders         Ordered    metoprolol tartrate (LOPRESSOR) 50 MG tablet  2 times daily     06/21/19 2306    potassium chloride (KLOR-CON) 10 MEQ tablet  Daily     06/21/19 2308           Noemi Chapel, MD 06/21/19 2311

## 2019-06-21 NOTE — ED Triage Notes (Signed)
Pt with chest pain and sob that started 30 min ago while sitting on the bed.  Denies N/V or diaphoresis.

## 2019-09-11 ENCOUNTER — Other Ambulatory Visit: Payer: Self-pay

## 2019-09-11 ENCOUNTER — Ambulatory Visit (HOSPITAL_COMMUNITY)
Admission: RE | Admit: 2019-09-11 | Discharge: 2019-09-11 | Disposition: A | Discharge status: Home / Self Care | Payer: No Typology Code available for payment source | Source: Ambulatory Visit | Attending: Internal Medicine | Admitting: Internal Medicine

## 2019-09-11 DIAGNOSIS — Z1231 Encounter for screening mammogram for malignant neoplasm of breast: Secondary | ICD-10-CM | POA: Diagnosis not present

## 2019-09-13 ENCOUNTER — Other Ambulatory Visit (HOSPITAL_COMMUNITY): Payer: Self-pay | Admitting: Internal Medicine

## 2019-09-20 ENCOUNTER — Other Ambulatory Visit (HOSPITAL_COMMUNITY): Payer: Self-pay | Admitting: Internal Medicine

## 2019-09-20 DIAGNOSIS — R928 Other abnormal and inconclusive findings on diagnostic imaging of breast: Secondary | ICD-10-CM

## 2019-10-01 ENCOUNTER — Ambulatory Visit (HOSPITAL_COMMUNITY)
Admission: RE | Admit: 2019-10-01 | Discharge: 2019-10-01 | Disposition: A | Discharge status: Home / Self Care | Payer: PRIVATE HEALTH INSURANCE | Source: Ambulatory Visit | Attending: Internal Medicine | Admitting: Internal Medicine

## 2019-10-01 ENCOUNTER — Other Ambulatory Visit: Payer: Self-pay

## 2019-10-01 DIAGNOSIS — R928 Other abnormal and inconclusive findings on diagnostic imaging of breast: Secondary | ICD-10-CM | POA: Diagnosis not present

## 2019-12-05 ENCOUNTER — Encounter: Payer: No Typology Code available for payment source | Admitting: Obstetrics and Gynecology

## 2020-04-06 ENCOUNTER — Inpatient Hospital Stay (HOSPITAL_COMMUNITY)
Admission: EM | Admit: 2020-04-06 | Discharge: 2020-04-08 | DRG: 247 | Disposition: A | Discharge status: Home / Self Care | Payer: Self-pay | Attending: Cardiology | Admitting: Cardiology

## 2020-04-06 ENCOUNTER — Emergency Department (HOSPITAL_COMMUNITY): Payer: Self-pay

## 2020-04-06 ENCOUNTER — Observation Stay (HOSPITAL_COMMUNITY): Payer: Self-pay

## 2020-04-06 ENCOUNTER — Other Ambulatory Visit: Payer: Self-pay

## 2020-04-06 ENCOUNTER — Encounter (HOSPITAL_COMMUNITY): Payer: Self-pay | Admitting: Emergency Medicine

## 2020-04-06 DIAGNOSIS — F17210 Nicotine dependence, cigarettes, uncomplicated: Secondary | ICD-10-CM | POA: Diagnosis present

## 2020-04-06 DIAGNOSIS — K219 Gastro-esophageal reflux disease without esophagitis: Secondary | ICD-10-CM | POA: Diagnosis present

## 2020-04-06 DIAGNOSIS — E661 Drug-induced obesity: Secondary | ICD-10-CM

## 2020-04-06 DIAGNOSIS — E119 Type 2 diabetes mellitus without complications: Secondary | ICD-10-CM | POA: Diagnosis present

## 2020-04-06 DIAGNOSIS — R079 Chest pain, unspecified: Secondary | ICD-10-CM

## 2020-04-06 DIAGNOSIS — I214 Non-ST elevation (NSTEMI) myocardial infarction: Principal | ICD-10-CM | POA: Diagnosis present

## 2020-04-06 DIAGNOSIS — Z8249 Family history of ischemic heart disease and other diseases of the circulatory system: Secondary | ICD-10-CM

## 2020-04-06 DIAGNOSIS — Z825 Family history of asthma and other chronic lower respiratory diseases: Secondary | ICD-10-CM

## 2020-04-06 DIAGNOSIS — Z955 Presence of coronary angioplasty implant and graft: Secondary | ICD-10-CM

## 2020-04-06 DIAGNOSIS — Z79899 Other long term (current) drug therapy: Secondary | ICD-10-CM

## 2020-04-06 DIAGNOSIS — Z20822 Contact with and (suspected) exposure to covid-19: Secondary | ICD-10-CM | POA: Diagnosis present

## 2020-04-06 DIAGNOSIS — R778 Other specified abnormalities of plasma proteins: Secondary | ICD-10-CM

## 2020-04-06 DIAGNOSIS — E781 Pure hyperglyceridemia: Secondary | ICD-10-CM | POA: Diagnosis present

## 2020-04-06 DIAGNOSIS — I2510 Atherosclerotic heart disease of native coronary artery without angina pectoris: Secondary | ICD-10-CM | POA: Diagnosis present

## 2020-04-06 DIAGNOSIS — Z833 Family history of diabetes mellitus: Secondary | ICD-10-CM

## 2020-04-06 DIAGNOSIS — I10 Essential (primary) hypertension: Secondary | ICD-10-CM | POA: Diagnosis present

## 2020-04-06 DIAGNOSIS — Z83438 Family history of other disorder of lipoprotein metabolism and other lipidemia: Secondary | ICD-10-CM

## 2020-04-06 DIAGNOSIS — G629 Polyneuropathy, unspecified: Secondary | ICD-10-CM

## 2020-04-06 DIAGNOSIS — E785 Hyperlipidemia, unspecified: Secondary | ICD-10-CM | POA: Diagnosis present

## 2020-04-06 DIAGNOSIS — Z96652 Presence of left artificial knee joint: Secondary | ICD-10-CM | POA: Diagnosis present

## 2020-04-06 DIAGNOSIS — E1169 Type 2 diabetes mellitus with other specified complication: Secondary | ICD-10-CM

## 2020-04-06 DIAGNOSIS — E669 Obesity, unspecified: Secondary | ICD-10-CM | POA: Diagnosis present

## 2020-04-06 DIAGNOSIS — Z6836 Body mass index (BMI) 36.0-36.9, adult: Secondary | ICD-10-CM

## 2020-04-06 HISTORY — DX: Atherosclerotic heart disease of native coronary artery without angina pectoris: I25.10

## 2020-04-06 HISTORY — DX: Pure hyperglyceridemia: E78.1

## 2020-04-06 LAB — HEPATIC FUNCTION PANEL
ALT: 32 U/L (ref 0–44)
AST: 16 U/L (ref 15–41)
Albumin: 4.7 g/dL (ref 3.5–5.0)
Alkaline Phosphatase: 68 U/L (ref 38–126)
Bilirubin, Direct: 0.1 mg/dL (ref 0.0–0.2)
Indirect Bilirubin: 0.6 mg/dL (ref 0.3–0.9)
Total Bilirubin: 0.7 mg/dL (ref 0.3–1.2)
Total Protein: 8.1 g/dL (ref 6.5–8.1)

## 2020-04-06 LAB — CBG MONITORING, ED: Glucose-Capillary: 231 mg/dL — ABNORMAL HIGH (ref 70–99)

## 2020-04-06 LAB — BASIC METABOLIC PANEL
Anion gap: 12 (ref 5–15)
BUN: 18 mg/dL (ref 6–20)
CO2: 22 mmol/L (ref 22–32)
Calcium: 9.5 mg/dL (ref 8.9–10.3)
Chloride: 100 mmol/L (ref 98–111)
Creatinine, Ser: 0.54 mg/dL (ref 0.44–1.00)
GFR, Estimated: >60 mL/min (ref >60)
Glucose, Bld: 168 mg/dL — ABNORMAL HIGH (ref 70–99)
Potassium: 3.9 mmol/L (ref 3.5–5.1)
Sodium: 134 mmol/L — ABNORMAL LOW (ref 135–145)

## 2020-04-06 LAB — CBC
HCT: 46.5 % — ABNORMAL HIGH (ref 36.0–46.0)
Hemoglobin: 15.3 g/dL — ABNORMAL HIGH (ref 12.0–15.0)
MCH: 29.7 pg (ref 26.0–34.0)
MCHC: 32.9 g/dL (ref 30.0–36.0)
MCV: 90.3 fL (ref 80.0–100.0)
Platelets: 392 10*3/uL (ref 150–400)
RBC: 5.15 MIL/uL — ABNORMAL HIGH (ref 3.87–5.11)
RDW: 13.8 % (ref 11.5–15.5)
WBC: 11.9 10*3/uL — ABNORMAL HIGH (ref 4.0–10.5)
nRBC: 0 % (ref 0.0–0.2)

## 2020-04-06 LAB — ECHOCARDIOGRAM COMPLETE
AR max vel: 1.74 cm2
AV Area VTI: 1.57 cm2
AV Area mean vel: 1.42 cm2
AV Mean grad: 8 mmHg
AV Peak grad: 14.4 mmHg
Ao pk vel: 1.9 m/s
Area-P 1/2: 3.19 cm2
Height: 65 in
S' Lateral: 2.6 cm
Weight: 3492.09 oz

## 2020-04-06 LAB — LIPID PANEL
Cholesterol: 206 mg/dL — ABNORMAL HIGH (ref 0–200)
HDL: 33 mg/dL — ABNORMAL LOW (ref >40)
LDL Cholesterol: 135 mg/dL — ABNORMAL HIGH (ref 0–99)
Total CHOL/HDL Ratio: 6.2 RATIO
Triglycerides: 191 mg/dL — ABNORMAL HIGH (ref <150)
VLDL: 38 mg/dL (ref 0–40)

## 2020-04-06 LAB — RESP PANEL BY RT-PCR (FLU A&B, COVID) ARPGX2
Influenza A by PCR: NEGATIVE
Influenza B by PCR: NEGATIVE
SARS Coronavirus 2 by RT PCR: NEGATIVE

## 2020-04-06 LAB — TSH: TSH: 1.321 u[IU]/mL (ref 0.350–4.500)

## 2020-04-06 LAB — LIPASE, BLOOD: Lipase: 23 U/L (ref 11–51)

## 2020-04-06 LAB — TROPONIN I (HIGH SENSITIVITY)
Troponin I (High Sensitivity): 42 ng/L — ABNORMAL HIGH (ref <18)
Troponin I (High Sensitivity): 50 ng/L — ABNORMAL HIGH (ref <18)
Troponin I (High Sensitivity): 106 ng/L (ref <18)
Troponin I (High Sensitivity): 86 ng/L — ABNORMAL HIGH (ref <18)

## 2020-04-06 LAB — D-DIMER, QUANTITATIVE: D-Dimer, Quant: <0.27 ug/mL-FEU (ref 0.00–0.50)

## 2020-04-06 MED ORDER — ASPIRIN 325 MG PO TABS
325.0000 mg | ORAL_TABLET | Freq: Once | ORAL | Status: AC
  Administered 2020-04-06: 325 mg via ORAL
  Filled 2020-04-06: qty 1

## 2020-04-06 MED ORDER — SODIUM CHLORIDE 0.9 % IV BOLUS
500.0000 mL | Freq: Once | INTRAVENOUS | Status: AC
  Administered 2020-04-06: 500 mL via INTRAVENOUS

## 2020-04-06 MED ORDER — ONDANSETRON HCL 4 MG/2ML IJ SOLN: 4.0000 mg | Freq: Four times a day (QID) | INTRAMUSCULAR | Status: DC | PRN

## 2020-04-06 MED ORDER — ALUM & MAG HYDROXIDE-SIMETH 200-200-20 MG/5ML PO SUSP
30.0000 mL | Freq: Once | ORAL | Status: AC
  Administered 2020-04-06: 30 mL via ORAL
  Filled 2020-04-06: qty 30

## 2020-04-06 MED ORDER — ACETAMINOPHEN 325 MG PO TABS
650.0000 mg | ORAL_TABLET | ORAL | Status: DC | PRN
  Administered 2020-04-07 (×2): 650 mg via ORAL
  Filled 2020-04-06 (×2): qty 2

## 2020-04-06 MED ORDER — FAMOTIDINE IN NACL 20-0.9 MG/50ML-% IV SOLN
20.0000 mg | Freq: Once | INTRAVENOUS | Status: AC
  Administered 2020-04-06: 20 mg via INTRAVENOUS
  Filled 2020-04-06: qty 50

## 2020-04-06 MED ORDER — ALUM & MAG HYDROXIDE-SIMETH 200-200-20 MG/5ML PO SUSP
30.0000 mL | Freq: Four times a day (QID) | ORAL | Status: DC | PRN
  Administered 2020-04-07 – 2020-04-08 (×4): 30 mL via ORAL
  Filled 2020-04-06 (×4): qty 30

## 2020-04-06 MED ORDER — INSULIN ASPART 100 UNIT/ML ~~LOC~~ SOLN
0.0000 [IU] | Freq: Every day | SUBCUTANEOUS | Status: DC
  Administered 2020-04-06: 2 [IU] via SUBCUTANEOUS
  Filled 2020-04-06: qty 1

## 2020-04-06 MED ORDER — ATORVASTATIN CALCIUM 10 MG PO TABS
20.0000 mg | ORAL_TABLET | Freq: Every day | ORAL | Status: DC
  Administered 2020-04-06 – 2020-04-07 (×2): 20 mg via ORAL
  Filled 2020-04-06 (×2): qty 2

## 2020-04-06 MED ORDER — AMLODIPINE BESYLATE 5 MG PO TABS: 5.0000 mg | ORAL_TABLET | Freq: Every day | ORAL | Status: DC

## 2020-04-06 MED ORDER — INSULIN ASPART 100 UNIT/ML ~~LOC~~ SOLN
0.0000 [IU] | Freq: Three times a day (TID) | SUBCUTANEOUS | Status: DC
  Administered 2020-04-07: 1 [IU] via SUBCUTANEOUS
  Administered 2020-04-07 – 2020-04-08 (×2): 2 [IU] via SUBCUTANEOUS
  Filled 2020-04-06: qty 1

## 2020-04-06 MED ORDER — GABAPENTIN 300 MG PO CAPS: 300.0000 mg | ORAL_CAPSULE | Freq: Three times a day (TID) | ORAL | Status: DC | PRN

## 2020-04-06 MED ORDER — HEPARIN SODIUM (PORCINE) 5000 UNIT/ML IJ SOLN
5000.0000 [IU] | Freq: Three times a day (TID) | INTRAMUSCULAR | Status: DC
  Administered 2020-04-06 – 2020-04-07 (×2): 5000 [IU] via SUBCUTANEOUS
  Filled 2020-04-06 (×2): qty 1

## 2020-04-06 MED ORDER — METOPROLOL TARTRATE 50 MG PO TABS
50.0000 mg | ORAL_TABLET | Freq: Two times a day (BID) | ORAL | Status: DC
  Administered 2020-04-06 (×2): 50 mg via ORAL
  Filled 2020-04-06 (×2): qty 1

## 2020-04-06 MED ORDER — PANTOPRAZOLE SODIUM 40 MG PO TBEC
40.0000 mg | DELAYED_RELEASE_TABLET | Freq: Two times a day (BID) | ORAL | Status: DC
  Administered 2020-04-06 – 2020-04-08 (×4): 40 mg via ORAL
  Filled 2020-04-06 (×4): qty 1

## 2020-04-06 MED ORDER — PANTOPRAZOLE SODIUM 40 MG IV SOLR
40.0000 mg | Freq: Once | INTRAVENOUS | Status: AC
  Administered 2020-04-06: 40 mg via INTRAVENOUS
  Filled 2020-04-06: qty 40

## 2020-04-06 MED ORDER — LIDOCAINE VISCOUS HCL 2 % MT SOLN
15.0000 mL | Freq: Once | OROMUCOSAL | Status: AC
  Administered 2020-04-06: 15 mL via ORAL
  Filled 2020-04-06: qty 15

## 2020-04-06 MED ORDER — FENTANYL CITRATE (PF) 100 MCG/2ML IJ SOLN
50.0000 ug | Freq: Once | INTRAMUSCULAR | Status: AC
  Administered 2020-04-06: 50 ug via INTRAVENOUS
  Filled 2020-04-06: qty 2

## 2020-04-06 NOTE — ED Notes (Signed)
Pt sitting up in bed, pt states that her pain comes and goes, states that she had maybe 15 minutes of relief after gi cocktail, states that her pain is back now.

## 2020-04-06 NOTE — ED Notes (Signed)
Provider at bedside

## 2020-04-06 NOTE — ED Notes (Signed)
Pt in bed, pt states that she no longer takes amlodipine, metop or lipitor, texted hospitalist, states to give the metop and lipitor for her acs, instructed to hold the amlodipine

## 2020-04-06 NOTE — Progress Notes (Addendum)
Patient discussed with ER staff. Presents with atypical chest pain. Mild trop in the 40s, EKG nonspecific ST/T changes. Needs further inpatient workup to decide on noninvasive vs invasive ischemic testing. Continue to cycle troponins, check echo, repeat EKG. Make npo at midnight, further data will try further recommendations. If next trop above 100 would start hep gtt, has already received ASA. Of note also presented fairly hypertensive 195/96, could be alternative explanation for mild trop. SBPs down to 160s on its own, no reason for rapid correction, will ad norvasc 5mg  daily for now. Full consult in AM when initial workup has been completed to decide on ischemic testing.     Carlyle Dolly MD

## 2020-04-06 NOTE — H&P (Signed)
History and Physical    [name] HQI:[id number] DOB: November 29, 1970 DOA: 04/06/2020  PCP: Jani Gravel, MD   Patient coming from: home  I have personally briefly reviewed patient's old medical records in [place]  Chief Complaint: Pain  HPI: [name] is a 49 y.o. female with medical history significant of type 2 diabetes, hypertension, obesity, neuropathy/sciatica and family history of heart disease/MI; who presented to the emergency department secondary to 5 days of intermittent chest discomfort.  Patient reports concerns for indigestion/gastroesophageal reflux disease with partial relief after using Tums and Maalox.  She expressed mild shortness of breath with activity and son left arm radiation of her discomfort intermittently.  Pain 5-6 out of 10 in intensity; dull in nature, with intermittent nausea and without specific aggravating or alleviating factors. Patient denies fever, coughing spells, sick contacts, chills, vomiting, diaphoresis, hematuria, hematochezia, melena, focal neurologic deficits or any other complaints.  Patient reports no longer taking medication for blood pressure following just diet control for her diabetes.  ED Course: Elevated blood pressure appreciated; blood sugar in the 160 range; chest x-ray without acute cardiopulmonary process.  Lipase negative; troponin mildly elevated and EKG without acute ischemic changes.  Given patient risk factors and family history [facility] has been contacted to place patient in the hospital for further evaluation and management.  Cardiology service was also consulted.  Lopressor, antiemetics, aspirin, fentanyl and PPI provided.  Review of Systems: As per HPI otherwise all other systems reviewed and are negative.   Past Medical History:  Diagnosis Date  . Arthritis   . Carpal tunnel syndrome, bilateral   . Diabetes mellitus without complication ([place])    no meds; diet controlled  . Diverticulitis   . Hypertension   . PONV  (postoperative nausea and vomiting)   . Sciatica     Past Surgical History:  Procedure Laterality Date  . ablasion of uterus    . CARPAL TUNNEL RELEASE  11/10/2010   Procedure: CARPAL TUNNEL RELEASE;  Surgeon: Arther Abbott, MD;  Location: [facility];  Service: Orthopedics;  Laterality: Right;  . CHOLECYSTECTOMY     [facility]  . COLON RESECTION N/A 04/08/2015   Procedure: LAPAROSCOPIC HAND ASSISTED PARTIAL COLECTOMY  CONVERTED TO OPEN AT 1324;  Surgeon: Aviva Signs, MD;  Location: [facility];  Service: General;  Laterality: N/A;  . COLONOSCOPY N/A 03/28/2014   Procedure: COLONOSCOPY;  Surgeon: Rogene Houston, MD;  Location: [facility];  Service: Endoscopy;  Laterality: N/A;  730  . KNEE ARTHROSCOPY WITH DRILLING/MICROFRACTURE Left 10/20/2016   Procedure: LEFT KNEE ARTHROSCOPY WITH MICROFRACTURE;  Surgeon: Carole Civil, MD;  Location: [facility];  Service: Orthopedics;  Laterality: Left;  . KNEE ARTHROSCOPY WITH DRILLING/MICROFRACTURE Left 04/27/2017   Procedure: KNEE ARTHROSCOPY WITH DRILLING/MICROFRACTURE;  Surgeon: Carole Civil, MD;  Location: [facility];  Service: Orthopedics;  Laterality: Left;  . KNEE ARTHROSCOPY WITH OSTEOCHONDRAL DEFECT REPAIR Left 04/27/2017   Procedure: KNEE ARTHROSCOPY WITH OSTEOCHONDRAL DEFECT REPAIR Autograft;  Surgeon: Carole Civil, MD;  Location: [facility];  Service: Orthopedics;  Laterality: Left;  . KNEE SURGERY    . PARTIAL COLECTOMY N/A 04/08/2015   Procedure: PARTIAL COLECTOMY;  Surgeon: Aviva Signs, MD;  Location: [facility];  Service: General;  Laterality: N/A;  . right knee arthroscopy    . TOTAL KNEE ARTHROPLASTY Left 10/17/2017   Procedure: TOTAL KNEE ARTHROPLASTY;  Surgeon: Carole Civil, MD;  Location: [facility];  Service: Orthopedics;  Laterality: Left;  [place]   .  TUBAL LIGATION      Social History  reports that she has been smoking cigarettes. She has a 26.00 pack-year smoking history. She has never used smokeless tobacco. She reports that she  does not drink alcohol and does not use drugs.  Allergies  Allergen Reactions  . Flagyl [Metronidazole] Nausea Only  . Hydrocodone Nausea And Vomiting and Other (See Comments)    Can take need to take with medication for nausea and vomitting    Family History  Problem Relation Age of Onset  . Diabetes type I Mother   . Hyperlipidemia Mother   . Diabetes type II Father   . Hypertension Father   . Hyperlipidemia Father   . Diabetes type II Brother   . Diabetes type II Brother   . Cancer Other   . Diabetes Other   . Arthritis Other   . Asthma Other   . Anesthesia problems Neg Hx     Prior to Admission medications   Medication Sig Start Date End Date Taking? Authorizing Provider  acetaminophen (TYLENOL) 500 MG tablet Take 500 mg by mouth every 6 (six) hours as needed for mild pain.   Yes [provider]  ibuprofen (ADVIL,MOTRIN) 200 MG tablet Take 800 mg by mouth every 6 (six) hours as needed for mild pain.   Yes [provider]  gabapentin (NEURONTIN) 300 MG capsule Take 1 capsule (300 mg total) by mouth 3 (three) times daily. Patient not taking: Reported on 04/06/2020 12/11/17   Carole Civil, MD  metoprolol tartrate (LOPRESSOR) 50 MG tablet Take 0.5 tablets (25 mg total) by mouth 2 (two) times daily. Patient not taking: Reported on 04/06/2020 06/21/19   Noemi Chapel, MD  potassium chloride (KLOR-CON) 10 MEQ tablet Take 1 tablet (10 mEq total) by mouth daily. 06/21/19   Noemi Chapel, MD  promethazine (PHENERGAN) 12.5 MG tablet Take 1 tablet (12.5 mg total) by mouth every 6 (six) hours as needed for nausea or vomiting. Patient not taking: Reported on 01/08/2018 11/29/17 06/21/19  Sanjuana Kava, MD    Physical Exam: Vitals:   04/06/20 1530 04/06/20 1620 04/06/20 1719 04/06/20 1800  BP: (!) 160/87 (!) 165/95 (!) 164/104 (!) 147/87  Pulse: 69 70 77 70  Resp: 12 17 18  (!) 21  Temp:   98 F (36.7 C)   TempSrc:   Oral   SpO2: 99% 98% 100% 98%  Weight:       Height:        Constitutional: Reporting no shortness of breath; still complaining of mid-epigastric  and lower chest discomfort. Vitals:   04/06/20 1530 04/06/20 1620 04/06/20 1719 04/06/20 1800  BP: (!) 160/87 (!) 165/95 (!) 164/104 (!) 147/87  Pulse: 69 70 77 70  Resp: 12 17 18  (!) 21  Temp:   98 F (36.7 C)   TempSrc:   Oral   SpO2: 99% 98% 100% 98%  Weight:      Height:       Eyes: PERRL, lids and conjunctivae normal, no icterus, no nystagmus. ENMT: Mucous membranes are moist. Posterior pharynx clear of any exudate or lesions. Neck: normal, supple, no masses, no thyromegaly Respiratory: clear to auscultation bilaterally, no wheezing, no crackles. Normal respiratory effort. No accessory muscle use.  Speaking in full sentences. Cardiovascular: Regular rate and rhythm, no rubs, no gallops, unable to properly assess JVD with body habitus.  No lower extremity edema.  Abdomen: Obese, no tenderness, no masses palpated. No hepatosplenomegaly. Bowel sounds positive.  Musculoskeletal: no clubbing /  cyanosis. No joint deformity upper and lower extremities. Good ROM, no contractures. Normal muscle tone.  Skin: no petechiae. Neurologic: CN 2-12 grossly intact. Sensation intact, DTR normal.  No focal neurologic deficits. Psychiatric: Normal judgment and insight. Alert and oriented x 3. Normal mood.    Labs on Admission: I have personally reviewed following labs and imaging studies  CBC: Recent Labs  Lab 04/06/20 1230  WBC 11.9*  HGB 15.3*  HCT 46.5*  MCV 90.3  PLT 381    Basic Metabolic Panel: Recent Labs  Lab 04/06/20 1230  NA 134*  K 3.9  CL 100  CO2 22  GLUCOSE 168*  BUN 18  CREATININE 0.54  CALCIUM 9.5    GFR: Estimated Creatinine Clearance: 100.2 mL/min (by C-G formula based on SCr of 0.54 mg/dL).  Liver Function Tests: Recent Labs  Lab 04/06/20 1230  AST 16  ALT 32  ALKPHOS 68  BILITOT 0.7  PROT 8.1  ALBUMIN 4.7    Urine analysis:    Component  Value Date/Time   COLORURINE YELLOW 01/02/2019 0134   APPEARANCEUR CLEAR 01/02/2019 0134   LABSPEC 1.014 01/02/2019 0134   PHURINE 5.0 01/02/2019 0134   GLUCOSEU NEGATIVE 01/02/2019 0134   HGBUR NEGATIVE 01/02/2019 0134   BILIRUBINUR NEGATIVE 01/02/2019 0134   KETONESUR NEGATIVE 01/02/2019 0134   PROTEINUR NEGATIVE 01/02/2019 0134   UROBILINOGEN 0.2 02/17/2015 0847   NITRITE NEGATIVE 01/02/2019 0134   LEUKOCYTESUR NEGATIVE 01/02/2019 0134    Radiological Exams on Admission: DG Chest Port 1 View  Result Date: 04/06/2020 CLINICAL DATA:  Chest pain EXAM: PORTABLE CHEST 1 VIEW COMPARISON:  06/21/2019 FINDINGS: The heart size and mediastinal contours are within normal limits. Both lungs are clear. The visualized skeletal structures are unremarkable. IMPRESSION: No active disease. Electronically Signed   By: Nelson Chimes M.D.   On: 04/06/2020 12:39    EKG: Independently reviewed.  Rhythm, no acute ischemic changes.  Assessment/Plan 1-chest pain -Heart score 4 -Atypical in presentation but with some typical features -EKG without acute ischemic changes -Troponin mildly elevated -Case has been discussed with cardiology service and will place in the hospital for further stratification -2D echo has been ordered -Started on aspirin, statins and beta-blocker -As needed antiemetics and PPI has been prescribed. -Will check lipid panel, A1c and TSH. -N.p.o. after midnight.  2-morbid obesity -Body mass index is 36.32 kg/m. -low Calorie diet, portion control and increase physical activity has been discussed with patient.  3-GERD -continue PPI  4-hypertension -Patient expressed that she has been taking of antihypertensive medication -Will continue beta-blocker twice a day -Follow vital signs.  5-type 2 diabetes mellitus -CBGs in the 160 range on admission  -will check A1c -Sliding scale insulin has been ordered. -Modified carbohydrate diet requested  6-history of  sciatica -Continue Neurontin.   DVT prophylaxis: Heparin. Code Status:   Full code. Family Communication:  No family at bedside. Disposition Plan:   Patient is from:  Home  Anticipated DC to:  Home  Anticipated DC date:  04/07/20  Anticipated DC barriers: Complete ACS work-up.  Consults called:  Cardiology service (Dr. Harl Bowie). Admission status:  Telemetry bed, observation, length of stay less than 2 midnights.  Severity of Illness: Mild severity; patient presenting with atypical chest discomfort with some typical features and significant risk factors.  Mild troponin elevation appreciated; no acute ischemic changes on EKG.  TR[facility] has been called to place patient in the hospital for further evaluation/rule out of ACS.  Barton Dubois MD [facility]  How to contact the [facility] Attending or Consulting provider [place] or covering provider during after hours [place], for this patient?   1. Check the care team in [facility] and look for a) attending/consulting [facility] provider listed and b) the [facility] team listed 2. Log into [email or web address] and use [place]'s universal password to access. If you do not have the password, please contact the hospital operator. 3. Locate the [facility] provider you are looking for under [facility] and page to a number that you can be directly reached. 4. If you still have difficulty reaching the provider, please page the [facility] (Director on Call) for the Hospitalists listed on amion for assistance.  04/06/2020, 6:55 PM

## 2020-04-06 NOTE — ED Notes (Signed)
Date and time results received: 04/06/20 8:41 PM  Test: Troponin Critical Value: 106  Name of Provider Notified:[place]   Orders Received? Or Actions Taken?: none yet

## 2020-04-06 NOTE — Progress Notes (Signed)
*  PRELIMINARY RESULTS* Echocardiogram 2D Echocardiogram has been performed.  [name] 04/06/2020, 5:07 PM

## 2020-04-06 NOTE — ED Triage Notes (Signed)
Pt c/o chest feeling like its burning x 5 days. Pt states she thinks its acid reflux but has treated it with mylanta and tums.

## 2020-04-06 NOTE — ED Provider Notes (Signed)
[facility] EMERGENCY DEPARTMENT Provider Note   CSN: [id number] Arrival date & time: 04/06/20  1128     History Chief Complaint  Patient presents with  . Chest Pain    [name] is a 49 y.o. female history includes diabetes, smoker, obesity, hypertension, cholecystectomy.  Patient presents for chest pain onset 5 days ago, no clear inciting event, describes a pressure sensation in the center of her chest occasionally radiates to her left arm, no clear aggravating factors, temporarily relieved with Tums and OTC antacids.  Denies similar pain in the past.  Denies fever/chills, fall/injury, cough/hemoptysis, shortness of breath, abdominal pain, nausea/vomiting, diarrhea, extremity swelling/color change or any additional concerns.  HPI     Past Medical History:  Diagnosis Date  . Arthritis   . Carpal tunnel syndrome, bilateral   . Diabetes mellitus without complication ([place])    no meds; diet controlled  . Diverticulitis   . Hypertension   . PONV (postoperative nausea and vomiting)   . Sciatica     Patient Active Problem List   Diagnosis Date Noted  . 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 Chondral defect of condyle of right femur   . S/P left knee arthroscopy 04/27/17 03/28/2017  . 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 Abdominal pain 02/17/2015  . Nausea without vomiting 02/17/2015  . Diverticulitis of intestine with abscess 01/08/2015  . Acute diverticulitis 01/08/2015  . Nausea 12/31/2014  . Diverticulitis 12/31/2014  . Hyperglycemia 12/31/2014  . Medication reaction 12/31/2014  . Diverticulitis of large intestine without perforation or abscess without bleeding   . Thrush, oral   . Fatty liver 03/11/2014  . Family hx of colon cancer requiring screening colonoscopy 03/11/2014  . Rt flank pain 03/11/2014  . Ulnar nerve compression 06/14/2011  . 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 LOW  BACK PAIN 11/07/2007  . SCIATICA 11/07/2007  . LUMBAR SPASM 11/07/2007    Past Surgical History:  Procedure Laterality Date  . ablasion of uterus    . CARPAL TUNNEL RELEASE  11/10/2010   Procedure: CARPAL TUNNEL RELEASE;  Surgeon: Arther Abbott, MD;  Location: [facility];  Service: Orthopedics;  Laterality: Right;  . CHOLECYSTECTOMY     [facility]  . COLON RESECTION N/A 04/08/2015   Procedure: LAPAROSCOPIC HAND ASSISTED PARTIAL COLECTOMY  CONVERTED TO OPEN AT 5284;  Surgeon: Aviva Signs, MD;  Location: [facility];  Service: General;  Laterality: N/A;  . COLONOSCOPY N/A 03/28/2014   Procedure: COLONOSCOPY;  Surgeon: Rogene Houston, MD;  Location: [facility];  Service: Endoscopy;  Laterality: N/A;  730  . KNEE ARTHROSCOPY WITH DRILLING/MICROFRACTURE Left 10/20/2016   Procedure: LEFT KNEE ARTHROSCOPY WITH MICROFRACTURE;  Surgeon: Carole Civil, MD;  Location: [facility] ORS;  Service: Orthopedics;  Laterality: Left;  . KNEE ARTHROSCOPY WITH DRILLING/MICROFRACTURE Left 04/27/2017   Procedure: KNEE ARTHROSCOPY WITH DRILLING/MICROFRACTURE;  Surgeon: Carole Civil, MD;  Location: [facility];  Service: Orthopedics;  Laterality: Left;  . KNEE ARTHROSCOPY WITH OSTEOCHONDRAL DEFECT REPAIR Left 04/27/2017   Procedure: KNEE ARTHROSCOPY WITH OSTEOCHONDRAL DEFECT REPAIR Autograft;  Surgeon: Carole Civil, MD;  Location: [facility];  Service: Orthopedics;  Laterality: Left;  . KNEE SURGERY    . PARTIAL COLECTOMY N/A 04/08/2015   Procedure: PARTIAL COLECTOMY;  Surgeon: Aviva Signs, MD;  Location: [facility];  Service: General;  Laterality: N/A;  . right knee arthroscopy    . TOTAL KNEE  ARTHROPLASTY Left 10/17/2017   Procedure: TOTAL KNEE ARTHROPLASTY;  Surgeon: Carole Civil, MD;  Location: [facility];  Service: Orthopedics;  Laterality: Left;  DeP[place]   . TUBAL LIGATION       OB History   No obstetric history on file.     Family History  Problem Relation Age of Onset  . Diabetes type I Mother   .  Hyperlipidemia Mother   . Diabetes type II Father   . Hypertension Father   . Hyperlipidemia Father   . Diabetes type II Brother   . Diabetes type II Brother   . Cancer Other   . Diabetes Other   . Arthritis Other   . Asthma Other   . Anesthesia problems Neg Hx     Social History   Tobacco Use  . Smoking status: Current Every Day Smoker    Packs/day: 1.00    Years: 26.00    Pack years: 26.00    Types: Cigarettes  . Smokeless tobacco: Never Used  Vaping Use  . Vaping Use: Never used  Substance Use Topics  . Alcohol use: No    Alcohol/week: 0.0 standard drinks  . Drug use: No    Home Medications Prior to Admission medications   Medication Sig Start Date End Date Taking? Authorizing Provider  acetaminophen (TYLENOL) 500 MG tablet Take 500 mg by mouth every 6 (six) hours as needed for mild pain.   Yes [provider]  ibuprofen (ADVIL,MOTRIN) 200 MG tablet Take 800 mg by mouth every 6 (six) hours as needed for mild pain.   Yes [provider]  gabapentin (NEURONTIN) 300 MG capsule Take 1 capsule (300 mg total) by mouth 3 (three) times daily. Patient not taking: Reported on 04/06/2020 12/11/17   Carole Civil, MD  metoprolol tartrate (LOPRESSOR) 50 MG tablet Take 0.5 tablets (25 mg total) by mouth 2 (two) times daily. Patient not taking: Reported on 04/06/2020 06/21/19   Noemi Chapel, MD  potassium chloride (KLOR-CON) 10 MEQ tablet Take 1 tablet (10 mEq total) by mouth daily. 06/21/19   Noemi Chapel, MD  promethazine (PHENERGAN) 12.5 MG tablet Take 1 tablet (12.5 mg total) by mouth every 6 (six) hours as needed for nausea or vomiting. Patient not taking: Reported on 01/08/2018 11/29/17 06/21/19  Sanjuana Kava, MD    Allergies    Flagyl [metronidazole] and Hydrocodone  Review of Systems   Review of Systems Ten systems are reviewed and are negative for acute change except as noted in the HPI  Physical Exam Updated Vital Signs BP (!) 174/89 (BP  Location: Left Arm)   Pulse 78   Temp 98.4 F (36.9 C) (Oral)   Resp 16   Ht 5\' 5"  (1.651 m)   Wt 99 kg   SpO2 99%   BMI 36.32 kg/m   Physical Exam Constitutional:      General: She is not in acute distress.    Appearance: Normal appearance. She is well-developed. She is not ill-appearing or diaphoretic.  HENT:     Head: Normocephalic and atraumatic.  Eyes:     General: Vision grossly intact. Gaze aligned appropriately.     Pupils: Pupils are equal, round, and reactive to light.  Neck:     Trachea: Trachea and phonation normal.  Cardiovascular:     Rate and Rhythm: Normal rate and regular rhythm.  Pulmonary:     Effort: Pulmonary effort is normal. No respiratory distress.     Breath sounds: Normal breath sounds.  Abdominal:     General: There is no distension.     Palpations: Abdomen is soft.     Tenderness: There is no abdominal tenderness. There is no guarding or rebound.  Musculoskeletal:        General: Normal range of motion.     Cervical back: Normal range of motion.     Right lower leg: No tenderness. No edema.     Left lower leg: No tenderness. No edema.  Skin:    General: Skin is warm and dry.  Neurological:     Mental Status: She is alert.     GCS: GCS eye subscore is 4. GCS verbal subscore is 5. GCS motor subscore is 6.     Comments: Speech is clear and goal oriented, follows commands Major Cranial nerves without deficit, no facial droop Moves extremities without ataxia, coordination intact  Psychiatric:        Behavior: Behavior normal.     ED Results / Procedures / Treatments   Labs (all labs ordered are listed, but only abnormal results are displayed) Labs Reviewed  BASIC METABOLIC PANEL - Abnormal; Notable for the following components:      Result Value   Sodium 134 (*)    Glucose, Bld 168 (*)    All other components within normal limits  CBC - Abnormal; Notable for the following components:   WBC 11.9 (*)    RBC 5.15 (*)    Hemoglobin 15.3  (*)    HCT 46.5 (*)    All other components within normal limits  TROPONIN I (HIGH SENSITIVITY) - Abnormal; Notable for the following components:   Troponin I (High Sensitivity) 42 (*)    All other components within normal limits  D-DIMER, QUANTITATIVE (NOT AT [facility])  LIPASE, BLOOD  HEPATIC FUNCTION PANEL  TROPONIN I (HIGH SENSITIVITY)    EKG EKG Interpretation  Date/Time:  Monday April 06 2020 11:39:02 EST Ventricular Rate:  95 PR Interval:  138 QRS Duration: 68 QT Interval:  348 QTC Calculation: 437 R Axis:   62 Text Interpretation: Normal sinus rhythm Nonspecific ST abnormality Abnormal ECG No significant change since last tracing Confirmed by Davonna Belling [id number]) on 04/06/2020 2:06:15 PM   Radiology DG Chest Port 1 View  Result Date: 04/06/2020 CLINICAL DATA:  Chest pain EXAM: PORTABLE CHEST 1 VIEW COMPARISON:  06/21/2019 FINDINGS: The heart size and mediastinal contours are within normal limits. Both lungs are clear. The visualized skeletal structures are unremarkable. IMPRESSION: No active disease. Electronically Signed   By: Nelson Chimes M.D.   On: 04/06/2020 12:39    Procedures Procedures (including critical care time)  Medications Ordered in ED Medications  amLODipine (NORVASC) tablet 5 mg (has no administration in time range)  alum & mag hydroxide-simeth (MAALOX/MYLANTA) 200-200-20 MG/5ML suspension 30 mL (30 mLs Oral Given 04/06/20 1234)    And  lidocaine (XYLOCAINE) 2 % viscous mouth solution 15 mL (15 mLs Oral Given 04/06/20 1234)  pantoprazole (PROTONIX) injection 40 mg (40 mg Intravenous Given 04/06/20 1237)  sodium chloride 0.9 % bolus 500 mL (0 mLs Intravenous Stopped 04/06/20 1443)  famotidine (PEPCID) IVPB 20 mg premix (0 mg Intravenous Stopped 04/06/20 1312)  aspirin tablet 325 mg (325 mg Oral Given 04/06/20 1443)    ED Course  I have reviewed the triage vital signs and the nursing notes.  Pertinent labs & imaging results that were  available during my care of the patient were reviewed by me and considered in my medical decision making (  see chart for details).  Clinical Course as of 04/06/20 1506  Mon Apr 06, 2020  1440 Dr. Dyann Kief [BM]  1504 Dr Harl Bowie [BM]    Clinical Course User Index [BM] Gari Crown   MDM Rules/Calculators/A&P                         Additional history obtained from: 1. Nursing notes from this visit. 2. Review of electronic medical records. ------------------------  I ordered, reviewed and interpreted labs which include: High-sensitivity troponin of 42, given patient's multiple cardiac risk factors will need admission. BMP shows no emergent electrolyte derangement, AKI or gap. D-dimer negative, doubt pulmonary embolism. LFTs within normal limits. Lipase within normal limits, doubt pancreatitis. CBC shows nonspecific leukocytosis of 11.9, hemoglobin of 15.3 is elevated, suspect some hemoconcentration, fluids given, patient without infectious type symptoms.  Chest x-ray:    IMPRESSION:  No active disease.   EKG: Normal sinus rhythm Nonspecific ST abnormality Abnormal ECG No significant change since last tracing Confirmed by Davonna Belling [phone]) on 04/06/2020 2:65:38 PM - 49 year old female arrived with 5 days of chest pain she has multiple cardiac risk factors.  Patient was concerned that pain was due to acid reflux, she did report some improvement with GI cocktail Pepcid and Protonix.  High-sensitivity troponin returned elevated and given patient's multiple risk factors she will need admission to the hospital for further evaluation.  Concern is ACS, history and work-up inconsistent with PE, dissection, anemia or other emergent cardiopulmonary etiologies at this time.  Patient was reevaluated she was resting comfortably in bed no acute distress she is agreeable for admission.  Case was discussed with attending physician Dr. Alvino Chapel who agrees with plan of care - 2:40 PM:  Consult hospitalist Dr. Dyann Kief who asked that we touch base with cardiology prior to admission. - 3:04 PM: Dr. Alvino Chapel spoke with cardiologist Dr. Harl Bowie, cardiology advises patient can be admitted here at [facility] and they will see patient in rounding. - Dr. Dyann Kief advised of cardiology recommendations and has accepted patient for admission.  Note: Portions of this report may have been transcribed using voice recognition software. Every effort was made to ensure accuracy; however, inadvertent computerized transcription errors may still be present. Final Clinical Impression(s) / ED Diagnoses Final diagnoses:  Chest pain, unspecified type    Rx / DC Orders ED Discharge Orders    None       Gari Crown 04/06/20 1506    Davonna Belling, MD 04/07/20 [phone]

## 2020-04-07 ENCOUNTER — Encounter (HOSPITAL_COMMUNITY): Payer: Self-pay | Admitting: Cardiology

## 2020-04-07 ENCOUNTER — Other Ambulatory Visit: Payer: Self-pay

## 2020-04-07 ENCOUNTER — Encounter (HOSPITAL_COMMUNITY)
Admission: EM | Disposition: A | Discharge status: Home / Self Care | Payer: Self-pay | Source: Home / Self Care | Attending: Cardiology

## 2020-04-07 DIAGNOSIS — I2510 Atherosclerotic heart disease of native coronary artery without angina pectoris: Secondary | ICD-10-CM

## 2020-04-07 DIAGNOSIS — R0789 Other chest pain: Secondary | ICD-10-CM

## 2020-04-07 DIAGNOSIS — I214 Non-ST elevation (NSTEMI) myocardial infarction: Secondary | ICD-10-CM

## 2020-04-07 HISTORY — PX: LEFT HEART CATH AND CORONARY ANGIOGRAPHY: CATH118249

## 2020-04-07 HISTORY — PX: CORONARY STENT INTERVENTION: CATH118234

## 2020-04-07 LAB — GLUCOSE, CAPILLARY
Glucose-Capillary: 132 mg/dL — ABNORMAL HIGH (ref 70–99)
Glucose-Capillary: 158 mg/dL — ABNORMAL HIGH (ref 70–99)

## 2020-04-07 LAB — TROPONIN I (HIGH SENSITIVITY)
Troponin I (High Sensitivity): 161 ng/L (ref <18)
Troponin I (High Sensitivity): 96 ng/L — ABNORMAL HIGH (ref <18)

## 2020-04-07 LAB — HEMOGLOBIN A1C
Hgb A1c MFr Bld: 7 % — ABNORMAL HIGH (ref 4.8–5.6)
Mean Plasma Glucose: 154.2 mg/dL

## 2020-04-07 LAB — HIV ANTIBODY (ROUTINE TESTING W REFLEX): HIV Screen 4th Generation wRfx: NONREACTIVE

## 2020-04-07 LAB — POCT ACTIVATED CLOTTING TIME
Activated Clotting Time: 279 seconds
Activated Clotting Time: 285 seconds
Activated Clotting Time: 487 seconds

## 2020-04-07 LAB — CBG MONITORING, ED: Glucose-Capillary: 169 mg/dL — ABNORMAL HIGH (ref 70–99)

## 2020-04-07 SURGERY — LEFT HEART CATH AND CORONARY ANGIOGRAPHY
Anesthesia: LOCAL

## 2020-04-07 MED ORDER — TICAGRELOR 90 MG PO TABS
90.0000 mg | ORAL_TABLET | Freq: Two times a day (BID) | ORAL | Status: DC
  Administered 2020-04-08: 90 mg via ORAL
  Filled 2020-04-07 (×2): qty 1

## 2020-04-07 MED ORDER — HEPARIN SODIUM (PORCINE) 1000 UNIT/ML IJ SOLN
INTRAMUSCULAR | Status: AC
  Filled 2020-04-07: qty 1

## 2020-04-07 MED ORDER — LIDOCAINE HCL (PF) 1 % IJ SOLN
INTRAMUSCULAR | Status: DC | PRN
  Administered 2020-04-07: 2 mL

## 2020-04-07 MED ORDER — ASPIRIN 81 MG PO CHEW
81.0000 mg | CHEWABLE_TABLET | ORAL | Status: DC
Start: 2020-04-07 — End: 2020-04-07

## 2020-04-07 MED ORDER — VERAPAMIL HCL 2.5 MG/ML IV SOLN
INTRAVENOUS | Status: AC
  Filled 2020-04-07: qty 2

## 2020-04-07 MED ORDER — ONDANSETRON HCL 4 MG/2ML IJ SOLN: 4.0000 mg | Freq: Four times a day (QID) | INTRAMUSCULAR | Status: DC | PRN

## 2020-04-07 MED ORDER — NITROGLYCERIN 1 MG/10 ML FOR IR/CATH LAB
INTRA_ARTERIAL | Status: DC | PRN
  Administered 2020-04-07 (×3): 200 ug via INTRACORONARY
  Administered 2020-04-07: 200 ug via INTRA_ARTERIAL

## 2020-04-07 MED ORDER — MIDAZOLAM HCL 2 MG/2ML IJ SOLN
INTRAMUSCULAR | Status: AC
  Filled 2020-04-07: qty 2

## 2020-04-07 MED ORDER — HYDRALAZINE HCL 20 MG/ML IJ SOLN: 10.0000 mg | INTRAMUSCULAR | Status: AC | PRN

## 2020-04-07 MED ORDER — SODIUM CHLORIDE 0.9% FLUSH: 3.0000 mL | INTRAVENOUS | Status: DC | PRN

## 2020-04-07 MED ORDER — ASPIRIN EC 81 MG PO TBEC
81.0000 mg | DELAYED_RELEASE_TABLET | Freq: Every day | ORAL | Status: DC
  Administered 2020-04-07: 81 mg via ORAL
  Filled 2020-04-07: qty 1

## 2020-04-07 MED ORDER — VERAPAMIL HCL 2.5 MG/ML IV SOLN
INTRAVENOUS | Status: DC | PRN
  Administered 2020-04-07: 10 mL via INTRA_ARTERIAL

## 2020-04-07 MED ORDER — FENTANYL CITRATE (PF) 100 MCG/2ML IJ SOLN
INTRAMUSCULAR | Status: DC | PRN
  Administered 2020-04-07 (×2): 25 ug via INTRAVENOUS
  Administered 2020-04-07: 50 ug via INTRAVENOUS

## 2020-04-07 MED ORDER — HEPARIN SODIUM (PORCINE) 1000 UNIT/ML IJ SOLN
INTRAMUSCULAR | Status: DC | PRN
  Administered 2020-04-07: 5000 [IU] via INTRAVENOUS
  Administered 2020-04-07: 2000 [IU] via INTRAVENOUS
  Administered 2020-04-07: 6000 [IU] via INTRAVENOUS
  Administered 2020-04-07: 3000 [IU] via INTRAVENOUS

## 2020-04-07 MED ORDER — NITROGLYCERIN 0.4 MG SL SUBL
0.4000 mg | SUBLINGUAL_TABLET | SUBLINGUAL | Status: DC | PRN
Start: 2020-04-07 — End: 2020-04-08
  Administered 2020-04-07 (×2): 0.4 mg via SUBLINGUAL
  Filled 2020-04-07 (×2): qty 1

## 2020-04-07 MED ORDER — NITROGLYCERIN 1 MG/10 ML FOR IR/CATH LAB
INTRA_ARTERIAL | Status: AC
  Filled 2020-04-07: qty 10

## 2020-04-07 MED ORDER — SODIUM CHLORIDE 0.9 % IV SOLN: INTRAVENOUS | Status: AC

## 2020-04-07 MED ORDER — SODIUM CHLORIDE 0.9 % WEIGHT BASED INFUSION: 1.0000 mL/kg/h | INTRAVENOUS | Status: DC

## 2020-04-07 MED ORDER — ASPIRIN 81 MG PO CHEW
81.0000 mg | CHEWABLE_TABLET | Freq: Every day | ORAL | Status: DC
  Administered 2020-04-08: 81 mg via ORAL
  Filled 2020-04-07: qty 1

## 2020-04-07 MED ORDER — MIDAZOLAM HCL 2 MG/2ML IJ SOLN
INTRAMUSCULAR | Status: DC | PRN
  Administered 2020-04-07: 2 mg via INTRAVENOUS
  Administered 2020-04-07 (×2): 1 mg via INTRAVENOUS

## 2020-04-07 MED ORDER — METOPROLOL TARTRATE 25 MG PO TABS
25.0000 mg | ORAL_TABLET | Freq: Two times a day (BID) | ORAL | Status: DC
  Administered 2020-04-07 – 2020-04-08 (×3): 25 mg via ORAL
  Filled 2020-04-07 (×3): qty 1

## 2020-04-07 MED ORDER — HEPARIN (PORCINE) 25000 UT/250ML-% IV SOLN
1000.0000 [IU]/h | INTRAVENOUS | Status: DC
  Administered 2020-04-07: 1000 [IU]/h via INTRAVENOUS
  Filled 2020-04-07: qty 250

## 2020-04-07 MED ORDER — HEPARIN (PORCINE) IN NACL 1000-0.9 UT/500ML-% IV SOLN
INTRAVENOUS | Status: AC
  Filled 2020-04-07: qty 1000

## 2020-04-07 MED ORDER — TICAGRELOR 90 MG PO TABS
ORAL_TABLET | ORAL | Status: DC | PRN
  Administered 2020-04-07: 180 mg via ORAL

## 2020-04-07 MED ORDER — LIDOCAINE HCL (PF) 1 % IJ SOLN
INTRAMUSCULAR | Status: AC
  Filled 2020-04-07: qty 30

## 2020-04-07 MED ORDER — SODIUM CHLORIDE 0.9 % WEIGHT BASED INFUSION
3.0000 mL/kg/h | INTRAVENOUS | Status: AC
Start: 2020-04-07 — End: 2020-04-07

## 2020-04-07 MED ORDER — FENTANYL CITRATE (PF) 100 MCG/2ML IJ SOLN
INTRAMUSCULAR | Status: AC
  Filled 2020-04-07: qty 2

## 2020-04-07 MED ORDER — HEPARIN (PORCINE) IN NACL 1000-0.9 UT/500ML-% IV SOLN
INTRAVENOUS | Status: DC | PRN
  Administered 2020-04-07 (×4): 500 mL

## 2020-04-07 MED ORDER — LABETALOL HCL 5 MG/ML IV SOLN: 10.0000 mg | INTRAVENOUS | Status: AC | PRN

## 2020-04-07 MED ORDER — SODIUM CHLORIDE 0.9% FLUSH: 3.0000 mL | Freq: Two times a day (BID) | INTRAVENOUS | Status: DC

## 2020-04-07 MED ORDER — HEPARIN BOLUS VIA INFUSION
4000.0000 [IU] | Freq: Once | INTRAVENOUS | Status: AC
  Administered 2020-04-07: 4000 [IU] via INTRAVENOUS

## 2020-04-07 MED ORDER — SODIUM CHLORIDE 0.9 % IV SOLN: 250.0000 mL | INTRAVENOUS | Status: DC | PRN

## 2020-04-07 SURGICAL SUPPLY — 24 items
BALLN SAPPHIRE 2.5X20 (BALLOONS) ×2
BALLN SAPPHIRE ~~LOC~~ 3.75X15 (BALLOONS) ×1 IMPLANT
BALLN ~~LOC~~ EMERGE MR 4.0X8 (BALLOONS) ×2
BALLOON SAPPHIRE 2.5X20 (BALLOONS) IMPLANT
BALLOON ~~LOC~~ EMERGE MR 4.0X8 (BALLOONS) IMPLANT
CATH 5FR JL3.5 JR4 ANG PIG MP (CATHETERS) ×1 IMPLANT
CATH LAUNCHER 6FR AL1 (CATHETERS) IMPLANT
CATH LAUNCHER 6FR EBU3.5 (CATHETERS) ×1 IMPLANT
CATH OPTICROSS HD (CATHETERS) ×1 IMPLANT
CATHETER LAUNCHER 6FR AL1 (CATHETERS) ×2
DEVICE RAD COMP TR BAND LRG (VASCULAR PRODUCTS) ×1 IMPLANT
GLIDESHEATH SLEND SS 6F .021 (SHEATH) ×1 IMPLANT
GUIDEWIRE INQWIRE 1.5J.035X260 (WIRE) IMPLANT
INQWIRE 1.5J .035X260CM (WIRE) ×2
KIT ENCORE 26 ADVANTAGE (KITS) ×1 IMPLANT
KIT HEART LEFT (KITS) ×2 IMPLANT
KIT HEMO VALVE WATCHDOG (MISCELLANEOUS) ×1 IMPLANT
PACK CARDIAC CATHETERIZATION (CUSTOM PROCEDURE TRAY) ×2 IMPLANT
SLED PULL BACK IVUS (MISCELLANEOUS) ×1 IMPLANT
STENT RESOLUTE ONYX 3.0X22 (Permanent Stent) ×1 IMPLANT
STENT RESOLUTE ONYX 3.5X12 (Permanent Stent) ×1 IMPLANT
TRANSDUCER W/STOPCOCK (MISCELLANEOUS) ×2 IMPLANT
TUBING CIL FLEX 10 FLL-RA (TUBING) ×2 IMPLANT
WIRE ASAHI PROWATER 180CM (WIRE) ×1 IMPLANT

## 2020-04-07 NOTE — Progress Notes (Signed)
ANTICOAGULATION CONSULT NOTE - Initial Consult  Pharmacy Consult for Heparin Indication: chest pain/ACS  Allergies  Allergen Reactions  . Flagyl [Metronidazole] Nausea Only  . Hydrocodone Nausea And Vomiting and Other (See Comments)    Can take need to take with medication for nausea and vomitting    Patient Measurements: Height: 5\' 5"  (165.1 cm) Weight: 99 kg (218 lb 4.1 oz) IBW/kg (Calculated) : 57 HEPARIN DW (KG): 79.6  Vital Signs: BP: 123/78 (12/14 0700) Pulse Rate: 62 (12/14 0700)  Labs: Recent Labs    04/06/20 1230 04/06/20 1435 04/06/20 2105 04/07/20 0330 04/07/20 0743  HGB 15.3*  --   --   --   --   HCT 46.5*  --   --   --   --   PLT 392  --   --   --   --   CREATININE 0.54  --   --   --   --   TROPONINIHS 42*   < > 86* 161* 96*   < > = values in this interval not displayed.    Estimated Creatinine Clearance: 100.2 mL/min (by C-G formula based on SCr of 0.54 mg/dL).   Medical History: Past Medical History:  Diagnosis Date  . Arthritis   . Carpal tunnel syndrome, bilateral   . Diabetes mellitus without complication ([place])    no meds; diet controlled  . Diverticulitis   . Hypertension   . PONV (postoperative nausea and vomiting)   . Sciatica     Medications:  See med rec  Assessment: presents with a 5-day history of intermittent burning along her chest and throat which radiates into her left arm. Given her ongoing symptoms along with elevated troponin values, plan for a cardiac catheterization for definitive evaluation. Patient is not on oral anticoags at home. Pharmacy asked to start heparin  Goal of Therapy:  Heparin level 0.3-0.7 units/ml Monitor platelets by anticoagulation protocol: Yes   Plan:  Give 4000 units bolus x 1 Start heparin infusion at 1000 units/hr Check anti-Xa level in ~6-8 hours and daily while on heparin Continue to monitor H&H and platelets  Isac Sarna, BS Vena Austria, BCPS Clinical Pharmacist Pager  [phone] 04/07/2020,9:11 AM

## 2020-04-07 NOTE — Consult Note (Addendum)
Cardiology Consult    Patient ID: AIZLYN SCHIFANO; [id number]; 12-Mar-1971   Admit date: 04/06/2020 Date of Consult: 04/07/2020  Primary Care Provider: Jani Gravel, MD Primary Cardiologist: New to [facility] - Dr. Harl Bowie  Patient Profile    [name] is a 49 y.o. female with past medical history of palpitations (SVT vs. sinus tachycardia in 05/2019), HTN, Type 2 DM and family history of CAD who is being seen today for the evaluation of chest pain at the request of Dr. Wynetta Emery.   History of Present Illness    [name] presented to [facility] ED on 04/06/2020 for evaluation of burning in her chest for the past 5 days. She reports that starting on Friday she developed a burning sensation along her throat and she felt the burning and aching sensation along her left arm. Symptoms would occur at rest or with activity and she is unaware of any precipitating factors. Symptoms were not associated with food consumption. She did try several OTC medications for acid reflux without improvement in her symptoms. Reports episodes can last from several minutes up to 30 minutes. Denies any associated dyspnea, orthopnea, PND or lower extremity edema. Reports not taking Lopressor 25 mg twice daily regularly due to soft BP and low HR when checked.   She is unaware of any family history of CAD or CHF. She does have a family history of CAD in her father and brother. Reports her brother had a massive MI in his 68's. 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   BP was initially elevated to 195/96 while in the ED. Initial labs show WBC 11.9, Hgb 15.3, platelets 392, Na+ 134, K+ 3.9 and creatinine 0.54. TSH 1.321. Hgb A1c pending. COVID negative. Initial HS Troponin 42 with repeat values of 50, 106, 86 and 161. CXR with no active disease. EKG shows NSR, HR 95 with no acute ST abnormalities. Repeat this AM without acute changes. Echocardiogram shows a preserved EF of 65-70% with no regional WMA. Echo did show  mild LVH, normal RV function, and no significant valve abnormalities.    Past Medical History:  Diagnosis Date  . Arthritis   . Carpal tunnel syndrome, bilateral   . Diabetes mellitus without complication ([place])    no meds; diet controlled  . Diverticulitis   . Hypertension   . PONV (postoperative nausea and vomiting)   . Sciatica     Past Surgical History:  Procedure Laterality Date  . ablasion of uterus    . CARPAL TUNNEL RELEASE  11/10/2010   Procedure: CARPAL TUNNEL RELEASE;  Surgeon: Arther Abbott, MD;  Location: [facility];  Service: Orthopedics;  Laterality: Right;  . CHOLECYSTECTOMY     [facility]  . COLON RESECTION N/A 04/08/2015   Procedure: LAPAROSCOPIC HAND ASSISTED PARTIAL COLECTOMY  CONVERTED TO OPEN AT 9675;  Surgeon: Aviva Signs, MD;  Location: [facility];  Service: General;  Laterality: N/A;  . COLONOSCOPY N/A 03/28/2014   Procedure: COLONOSCOPY;  Surgeon: Rogene Houston, MD;  Location: [facility] END[facility] SUITE;  Service: Endoscopy;  Laterality: N/A;  730  . KNEE ARTHROSCOPY WITH DRILLING/MICROFRACTURE Left 10/20/2016   Procedure: LEFT KNEE ARTHROSCOPY WITH MICROFRACTURE;  Surgeon: Carole Civil, MD;  Location: [facility] ORS;  Service: Orthopedics;  Laterality: Left;  . KNEE ARTHROSCOPY WITH DRILLING/MICROFRACTURE Left 04/27/2017   Procedure: KNEE ARTHROSCOPY WITH DRILLING/MICROFRACTURE;  Surgeon: Carole Civil, MD;  Location: [facility];  Service: Orthopedics;  Laterality: Left;  . KNEE ARTHROSCOPY WITH  OSTEOCHONDRAL DEFECT REPAIR Left 04/27/2017   Procedure: KNEE ARTHROSCOPY WITH OSTEOCHONDRAL DEFECT REPAIR Autograft;  Surgeon: Carole Civil, MD;  Location: [facility];  Service: Orthopedics;  Laterality: Left;  . KNEE SURGERY    . PARTIAL COLECTOMY N/A 04/08/2015   Procedure: PARTIAL COLECTOMY;  Surgeon: Aviva Signs, MD;  Location: [facility];  Service: General;  Laterality: N/A;  . right knee arthroscopy    . TOTAL KNEE ARTHROPLASTY Left 10/17/2017   Procedure: TOTAL KNEE ARTHROPLASTY;   Surgeon: Carole Civil, MD;  Location: [facility];  Service: Orthopedics;  Laterality: Left;  [place]   . TUBAL LIGATION       Home Medications:  Prior to Admission medications   Medication Sig Start Date End Date Taking? Authorizing Provider  acetaminophen (TYLENOL) 500 MG tablet Take 500 mg by mouth every 6 (six) hours as needed for mild pain.   Yes [provider]  ibuprofen (ADVIL,MOTRIN) 200 MG tablet Take 800 mg by mouth every 6 (six) hours as needed for mild pain.   Yes [provider]  gabapentin (NEURONTIN) 300 MG capsule Take 1 capsule (300 mg total) by mouth 3 (three) times daily. Patient not taking: Reported on 04/06/2020 12/11/17   Carole Civil, MD  metoprolol tartrate (LOPRESSOR) 50 MG tablet Take 0.5 tablets (25 mg total) by mouth 2 (two) times daily. Patient not taking: Reported on 04/06/2020 06/21/19   Noemi Chapel, MD  potassium chloride (KLOR-CON) 10 MEQ tablet Take 1 tablet (10 mEq total) by mouth daily. 06/21/19   Noemi Chapel, MD  promethazine (PHENERGAN) 12.5 MG tablet Take 1 tablet (12.5 mg total) by mouth every 6 (six) hours as needed for nausea or vomiting. Patient not taking: Reported on 01/08/2018 11/29/17 06/21/19  Sanjuana Kava, MD    Inpatient Medications: Scheduled Meds: . aspirin EC  81 mg Oral Daily  . atorvastatin  20 mg Oral q1800  . heparin  5,000 Units Subcutaneous Q8H  . insulin aspart  0-5 Units Subcutaneous QHS  . insulin aspart  0-9 Units Subcutaneous TID WC  . metoprolol tartrate  25 mg Oral BID  . pantoprazole  40 mg Oral BID   Continuous Infusions: . sodium chloride     Followed by  . sodium chloride     PRN Meds: acetaminophen, alum & mag hydroxide-simeth, gabapentin, nitroGLYCERIN, ondansetron (ZOFRAN) IV  Allergies:    Allergies  Allergen Reactions  . Flagyl [Metronidazole] Nausea Only  . Hydrocodone Nausea And Vomiting and Other (See Comments)    Can take need to take with medication for nausea and  vomitting    Social History:   Social History   Socioeconomic History  . Marital status: Divorced    Spouse name: Not on file  . Number of children: Not on file  . Years of education: college  . Highest education level: Not on file  Occupational History  . Occupation: Optician, dispensing: [employer]  Tobacco Use  . Smoking status: Current Every Day Smoker    Packs/day: 1.00    Years: 26.00    Pack years: 26.00    Types: Cigarettes  . Smokeless tobacco: Never Used  Vaping Use  . Vaping Use: Never used  Substance and Sexual Activity  . Alcohol use: No    Alcohol/week: 0.0 standard drinks  . Drug use: No  . Sexual activity: Yes    Birth control/protection: Surgical  Other Topics Concern  . Not on file  Social History Narrative  . Not  on file   Social Determinants of Health   Financial Resource Strain: Not on file  Food Insecurity: Not on file  Transportation Needs: Not on file  Physical Activity: Not on file  Stress: Not on file  Social Connections: Not on file  Intimate Partner Violence: Not on file     Family History:    Family History  Problem Relation Age of Onset  . Diabetes type I Mother   . Hyperlipidemia Mother   . Diabetes type II Father   . Hypertension Father   . Hyperlipidemia Father   . Diabetes type II Brother   . Diabetes type II Brother   . Cancer Other   . Diabetes Other   . Arthritis Other   . Asthma Other   . Anesthesia problems Neg Hx       Review of Systems    General:  No chills, fever, night sweats or weight changes.  Cardiovascular:  No dyspnea on exertion, edema, orthopnea, palpitations, paroxysmal nocturnal dyspnea. Positive for chest pain.  Dermatological: No rash, lesions/masses Respiratory: No cough, dyspnea Urologic: No hematuria, dysuria Abdominal:   No nausea, vomiting, diarrhea, bright red blood per rectum, melena, or hematemesis Neurologic:  No visual changes, wkns, changes in mental status. All other systems  reviewed and are otherwise negative except as noted above.  Physical Exam/Data    Vitals:   04/07/20 0615 04/07/20 0630 04/07/20 0645 04/07/20 0700  BP:    123/78  Pulse: 71 (!) 57 (!) 55 62  Resp: 15 16 16  (!) 35  Temp:      TempSrc:      SpO2: 100% 94% 99% 100%  Weight:      Height:        Intake/Output Summary (Last 24 hours) at 04/07/2020 0908 Last data filed at 04/06/2020 1443 Gross per 24 hour  Intake 543.72 ml  Output --  Net 543.72 ml   Filed Weights   04/06/20 1141  Weight: 99 kg   Body mass index is 36.32 kg/m.   General: Pleasant female appearing in NAD Psych: Normal affect. Neuro: Alert and oriented X 3. Moves all extremities spontaneously. HEENT: Normal  Neck: Supple without bruits or JVD. Lungs:  Resp regular and unlabored, CTA without wheezing or rales. Heart: RRR no s3, s4, or murmurs. Abdomen: Soft, non-tender, non-distended, BS + x 4.  Extremities: No clubbing, cyanosis or edema. DP/PT/Radials 2+ and equal bilaterally.   EKG:  The EKG was personally reviewed and demonstrates: NSR, HR 95 with no acute ST abnormalities.  Telemetry:  Telemetry was personally reviewed and demonstrates: NSR, HR in 50's to 60's. No significant arrhythmias.    Labs/Studies     Relevant CV Studies:  Echocardiogram: 04/06/2020 IMPRESSIONS    1. Left ventricular ejection fraction, by estimation, is 65 to 70%. The  left ventricle has normal function. The left ventricle has no regional  wall motion abnormalities. There is mild left ventricular hypertrophy.  Left ventricular diastolic parameters  were normal.  2. Right ventricular systolic function is normal. The right ventricular  size is normal.  3. Left atrial size was mildly dilated.  4. The mitral valve is normal in structure. No evidence of mitral valve  regurgitation. No evidence of mitral stenosis.  5. The aortic valve is tricuspid. Aortic valve regurgitation is not  visualized. No aortic stenosis is  present.  6. The inferior vena cava is normal in size with greater than 50%  respiratory variability, suggesting right atrial pressure of 3  mmHg.   Laboratory Data:  Chemistry Recent Labs  Lab 04/06/20 1230  NA 134*  K 3.9  CL 100  CO2 22  GLUCOSE 168*  BUN 18  CREATININE 0.54  CALCIUM 9.5  GFRNONAA >60  ANIONGAP 12    Recent Labs  Lab 04/06/20 1230  PROT 8.1  ALBUMIN 4.7  AST 16  ALT 32  ALKPHOS 68  BILITOT 0.7   Hematology Recent Labs  Lab 04/06/20 1230  WBC 11.9*  RBC 5.15*  HGB 15.3*  HCT 46.5*  MCV 90.3  MCH 29.7  MCHC 32.9  RDW 13.8  PLT 392   Cardiac EnzymesNo results for input(s): TROPONINI in the last 168 hours. No results for input(s): TROPIPOC in the last 168 hours.  BNPNo results for input(s): BNP, PROBNP in the last 168 hours.  DDimer  Recent Labs  Lab 04/06/20 1230  DDIMER <0.27    Radiology/Studies:  DG Chest Port 1 View  Result Date: 04/06/2020 CLINICAL DATA:  Chest pain EXAM: PORTABLE CHEST 1 VIEW COMPARISON:  06/21/2019 FINDINGS: The heart size and mediastinal contours are within normal limits. Both lungs are clear. The visualized skeletal structures are unremarkable. IMPRESSION: No active disease. Electronically Signed   By: Nelson Chimes M.D.   On: 04/06/2020 12:39    Assessment & Plan    1. Chest Pain with Mixed Features - She presents with a 5-day history of intermittent burning along her chest and throat which radiates into her left arm. She has been taking OTC antacids without resolution of her symptoms. No association with food consumption or exertional activities.  - Initial HS Troponin 42 with repeat values of 50, 106, 86 and 161. EKG shows NSR, HR 95 with no acute ST abnormalities. Repeat this AM without acute changes. Echocardiogram shows a preserved EF of 65-70% with no regional WMA.  - Reviewed with Dr. Harl Bowie and given her ongoing symptoms along with elevated troponin values, will plan for a cardiac catheterization for  definitive evaluation. The patient understands that risks include but are not limited to stroke (1 in 1000), death (1 in 29), kidney failure [usually temporary] (1 in 500), bleeding (1 in 200), allergic reaction [possibly serious] (1 in 200). Continue Atorvastatin 20mg  daily and Lopressor 25mg  BID. Will add ASA 81mg  daily and Heparin. If improvement in symptoms with SL NTG, would start IV NTG. If cardiac catheterization unrevealing, would then need to consider a GI consult given her significant symptoms.   2. History of Palpitations - She had SVT vs. sinus tachycardia in 05/2019. Maintaining NSR this admission. Continue Lopressor 25mg  BID.   3. HTN - BP initially elevated to 195/96, improved to 123/78 this AM. Continue Lopressor 25 mg twice daily (ordered as 50mg  BID but she was only taking 25mg  BID prior to admission due to prior bradycardia).  4. Type 2 DM - Hgb A1c at 7.0 this admission. SSI has been ordered.    For questions or updates, please contact [place] Please consult [email or web address] for contact info under Cardiology/STEMI.  Signed, Erma Heritage, PA-C 04/07/2020, 9:08 AM Pager: [phone]  Attending note Patient seen and disucssed with PA Ahmed Prima, I agree with her documentation. Admitted with chest pains. Somewhat atypical symptoms however patient with significant risk factors with DM2, HTN, brother with MI in his 67s. Relatively mild troponins but significant delta 4x original trop, EKG SR nonspecific ST/T chagnes with subtle inferior and lateral precordial ST depressoin. Echo normal LVEF with no WMAs  Given risk factors, delta trop  pattern, and ongoing symptoms would plan for a definitive evaluation with a cath today. Medical therapy currently with ASA 81, atorva 20, lopressor 25mg  bid, hep gtt. After cath if CAD confirmed consider ACE/ARB.     Carlyle Dolly MD

## 2020-04-07 NOTE — ED Notes (Signed)
Pt c/o burning from epigastric to chest. maalox given to pt.

## 2020-04-07 NOTE — ED Notes (Signed)
Date and time results received: 04/07/20 0520 (use smartphrase ".now" to insert current time)  Test: troponin Critical Value: 161  Name of Provider Notified: Dr Clearence Ped  Orders Received? Or Actions Taken?: Actions Taken: no orders received

## 2020-04-07 NOTE — H&P (View-Only) (Signed)
Cardiology Consult    Patient ID: [name]; [id number]; 1970/08/21   Admit date: 04/06/2020 Date of Consult: 04/07/2020  Primary Care Provider: Jani Gravel, MD Primary Cardiologist: New to [facility] - Dr. Harl Bowie  Patient Profile    [name] is a 49 y.o. female with past medical history of palpitations (SVT vs. sinus tachycardia in 05/2019), HTN, Type 2 DM and family history of CAD who is being seen today for the evaluation of chest pain at the request of Dr. Wynetta Emery.   History of Present Illness    [name] presented to [facility] ED on 04/06/2020 for evaluation of burning in her chest for the past 5 days. She reports that starting on Friday she developed a burning sensation along her throat and she felt the burning and aching sensation along her left arm. Symptoms would occur at rest or with activity and she is unaware of any precipitating factors. Symptoms were not associated with food consumption. She did try several OTC medications for acid reflux without improvement in her symptoms. Reports episodes can last from several minutes up to 30 minutes. Denies any associated dyspnea, orthopnea, PND or lower extremity edema. Reports not taking Lopressor 25 mg twice daily regularly due to soft BP and low HR when checked.   She is unaware of any family history of CAD or CHF. She does have a family history of CAD in her father and brother. Reports her brother had a massive MI in his 59's. 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   BP was initially elevated to 195/96 while in the ED. Initial labs show WBC 11.9, Hgb 15.3, platelets 392, Na+ 134, K+ 3.9 and creatinine 0.54. TSH 1.321. Hgb A1c pending. COVID negative. Initial HS Troponin 42 with repeat values of 50, 106, 86 and 161. CXR with no active disease. EKG shows NSR, HR 95 with no acute ST abnormalities. Repeat this AM without acute changes. Echocardiogram shows a preserved EF of 65-70% with no regional WMA. Echo did show  mild LVH, normal RV function, and no significant valve abnormalities.    Past Medical History:  Diagnosis Date  . Arthritis   . Carpal tunnel syndrome, bilateral   . Diabetes mellitus without complication ([name])    no meds; diet controlled  . Diverticulitis   . Hypertension   . PONV (postoperative nausea and vomiting)   . Sciatica     Past Surgical History:  Procedure Laterality Date  . ablasion of uterus    . CARPAL TUNNEL RELEASE  11/10/2010   Procedure: CARPAL TUNNEL RELEASE;  Surgeon: Arther Abbott, MD;  Location: [facility];  Service: Orthopedics;  Laterality: Right;  . CHOLECYSTECTOMY     [facility]  . COLON RESECTION N/A 04/08/2015   Procedure: LAPAROSCOPIC HAND ASSISTED PARTIAL COLECTOMY  CONVERTED TO OPEN AT 8546;  Surgeon: Aviva Signs, MD;  Location: [facility];  Service: General;  Laterality: N/A;  . COLONOSCOPY N/A 03/28/2014   Procedure: COLONOSCOPY;  Surgeon: Rogene Houston, MD;  Location: [facility];  Service: Endoscopy;  Laterality: N/A;  730  . KNEE ARTHROSCOPY WITH DRILLING/MICROFRACTURE Left 10/20/2016   Procedure: LEFT KNEE ARTHROSCOPY WITH MICROFRACTURE;  Surgeon: Carole Civil, MD;  Location: [facility];  Service: Orthopedics;  Laterality: Left;  . KNEE ARTHROSCOPY WITH DRILLING/MICROFRACTURE Left 04/27/2017   Procedure: KNEE ARTHROSCOPY WITH DRILLING/MICROFRACTURE;  Surgeon: Carole Civil, MD;  Location: [facility];  Service: Orthopedics;  Laterality: Left;  . KNEE ARTHROSCOPY WITH  OSTEOCHONDRAL DEFECT REPAIR Left 04/27/2017   Procedure: KNEE ARTHROSCOPY WITH OSTEOCHONDRAL DEFECT REPAIR Autograft;  Surgeon: Carole Civil, MD;  Location: [facility];  Service: Orthopedics;  Laterality: Left;  . KNEE SURGERY    . PARTIAL COLECTOMY N/A 04/08/2015   Procedure: PARTIAL COLECTOMY;  Surgeon: Aviva Signs, MD;  Location: [facility];  Service: General;  Laterality: N/A;  . right knee arthroscopy    . TOTAL KNEE ARTHROPLASTY Left 10/17/2017   Procedure: TOTAL KNEE ARTHROPLASTY;   Surgeon: Carole Civil, MD;  Location: [facility];  Service: Orthopedics;  Laterality: Left;  [place]   . TUBAL LIGATION       Home Medications:  Prior to Admission medications   Medication Sig Start Date End Date Taking? Authorizing Provider  acetaminophen (TYLENOL) 500 MG tablet Take 500 mg by mouth every 6 (six) hours as needed for mild pain.   Yes [provider]  ibuprofen (ADVIL,MOTRIN) 200 MG tablet Take 800 mg by mouth every 6 (six) hours as needed for mild pain.   Yes [provider]  gabapentin (NEURONTIN) 300 MG capsule Take 1 capsule (300 mg total) by mouth 3 (three) times daily. Patient not taking: Reported on 04/06/2020 12/11/17   Carole Civil, MD  metoprolol tartrate (LOPRESSOR) 50 MG tablet Take 0.5 tablets (25 mg total) by mouth 2 (two) times daily. Patient not taking: Reported on 04/06/2020 06/21/19   Noemi Chapel, MD  potassium chloride (KLOR-CON) 10 MEQ tablet Take 1 tablet (10 mEq total) by mouth daily. 06/21/19   Noemi Chapel, MD  promethazine (PHENERGAN) 12.5 MG tablet Take 1 tablet (12.5 mg total) by mouth every 6 (six) hours as needed for nausea or vomiting. Patient not taking: Reported on 01/08/2018 11/29/17 06/21/19  Sanjuana Kava, MD    Inpatient Medications: Scheduled Meds: . aspirin EC  81 mg Oral Daily  . atorvastatin  20 mg Oral q1800  . heparin  5,000 Units Subcutaneous Q8H  . insulin aspart  0-5 Units Subcutaneous QHS  . insulin aspart  0-9 Units Subcutaneous TID WC  . metoprolol tartrate  25 mg Oral BID  . pantoprazole  40 mg Oral BID   Continuous Infusions: . sodium chloride     Followed by  . sodium chloride     PRN Meds: acetaminophen, alum & mag hydroxide-simeth, gabapentin, nitroGLYCERIN, ondansetron (ZOFRAN) IV  Allergies:    Allergies  Allergen Reactions  . Flagyl [Metronidazole] Nausea Only  . Hydrocodone Nausea And Vomiting and Other (See Comments)    Can take need to take with medication for nausea and  vomitting    Social History:   Social History   Socioeconomic History  . Marital status: Divorced    Spouse name: Not on file  . Number of children: Not on file  . Years of education: college  . Highest education level: Not on file  Occupational History  . Occupation: Optician, dispensing: [employer]  Tobacco Use  . Smoking status: Current Every Day Smoker    Packs/day: 1.00    Years: 26.00    Pack years: 26.00    Types: Cigarettes  . Smokeless tobacco: Never Used  Vaping Use  . Vaping Use: Never used  Substance and Sexual Activity  . Alcohol use: No    Alcohol/week: 0.0 standard drinks  . Drug use: No  . Sexual activity: Yes    Birth control/protection: Surgical  Other Topics Concern  . Not on file  Social History Narrative  . Not  on file   Social Determinants of Health   Financial Resource Strain: Not on file  Food Insecurity: Not on file  Transportation Needs: Not on file  Physical Activity: Not on file  Stress: Not on file  Social Connections: Not on file  Intimate Partner Violence: Not on file     Family History:    Family History  Problem Relation Age of Onset  . Diabetes type I Mother   . Hyperlipidemia Mother   . Diabetes type II Father   . Hypertension Father   . Hyperlipidemia Father   . Diabetes type II Brother   . Diabetes type II Brother   . Cancer Other   . Diabetes Other   . Arthritis Other   . Asthma Other   . Anesthesia problems Neg Hx       Review of Systems    General:  No chills, fever, night sweats or weight changes.  Cardiovascular:  No dyspnea on exertion, edema, orthopnea, palpitations, paroxysmal nocturnal dyspnea. Positive for chest pain.  Dermatological: No rash, lesions/masses Respiratory: No cough, dyspnea Urologic: No hematuria, dysuria Abdominal:   No nausea, vomiting, diarrhea, bright red blood per rectum, melena, or hematemesis Neurologic:  No visual changes, wkns, changes in mental status. All other systems  reviewed and are otherwise negative except as noted above.  Physical Exam/Data    Vitals:   04/07/20 0615 04/07/20 0630 04/07/20 0645 04/07/20 0700  BP:    123/78  Pulse: 71 (!) 57 (!) 55 62  Resp: 15 16 16  (!) 35  Temp:      TempSrc:      SpO2: 100% 94% 99% 100%  Weight:      Height:        Intake/Output Summary (Last 24 hours) at 04/07/2020 0908 Last data filed at 04/06/2020 1443 Gross per 24 hour  Intake 543.72 ml  Output --  Net 543.72 ml   Filed Weights   04/06/20 1141  Weight: 99 kg   Body mass index is 36.32 kg/m.   General: Pleasant female appearing in NAD Psych: Normal affect. Neuro: Alert and oriented X 3. Moves all extremities spontaneously. HEENT: Normal  Neck: Supple without bruits or JVD. Lungs:  Resp regular and unlabored, CTA without wheezing or rales. Heart: RRR no s3, s4, or murmurs. Abdomen: Soft, non-tender, non-distended, BS + x 4.  Extremities: No clubbing, cyanosis or edema. DP/PT/Radials 2+ and equal bilaterally.   EKG:  The EKG was personally reviewed and demonstrates: NSR, HR 95 with no acute ST abnormalities.  Telemetry:  Telemetry was personally reviewed and demonstrates: NSR, HR in 50's to 60's. No significant arrhythmias.    Labs/Studies     Relevant CV Studies:  Echocardiogram: 04/06/2020 IMPRESSIONS    1. Left ventricular ejection fraction, by estimation, is 65 to 70%. The  left ventricle has normal function. The left ventricle has no regional  wall motion abnormalities. There is mild left ventricular hypertrophy.  Left ventricular diastolic parameters  were normal.  2. Right ventricular systolic function is normal. The right ventricular  size is normal.  3. Left atrial size was mildly dilated.  4. The mitral valve is normal in structure. No evidence of mitral valve  regurgitation. No evidence of mitral stenosis.  5. The aortic valve is tricuspid. Aortic valve regurgitation is not  visualized. No aortic stenosis is  present.  6. The inferior vena cava is normal in size with greater than 50%  respiratory variability, suggesting right atrial pressure of 3  mmHg.   Laboratory Data:  Chemistry Recent Labs  Lab 04/06/20 1230  NA 134*  K 3.9  CL 100  CO2 22  GLUCOSE 168*  BUN 18  CREATININE 0.54  CALCIUM 9.5  GFRNONAA >60  ANIONGAP 12    Recent Labs  Lab 04/06/20 1230  PROT 8.1  ALBUMIN 4.7  AST 16  ALT 32  ALKPHOS 68  BILITOT 0.7   Hematology Recent Labs  Lab 04/06/20 1230  WBC 11.9*  RBC 5.15*  HGB 15.3*  HCT 46.5*  MCV 90.3  MCH 29.7  MCHC 32.9  RDW 13.8  PLT 392   Cardiac EnzymesNo results for input(s): TROPONINI in the last 168 hours. No results for input(s): TROPIPOC in the last 168 hours.  BNPNo results for input(s): BNP, PROBNP in the last 168 hours.  DDimer  Recent Labs  Lab 04/06/20 1230  DDIMER <0.27    Radiology/Studies:  DG Chest Port 1 View  Result Date: 04/06/2020 CLINICAL DATA:  Chest pain EXAM: PORTABLE CHEST 1 VIEW COMPARISON:  06/21/2019 FINDINGS: The heart size and mediastinal contours are within normal limits. Both lungs are clear. The visualized skeletal structures are unremarkable. IMPRESSION: No active disease. Electronically Signed   By: Nelson Chimes M.D.   On: 04/06/2020 12:39    Assessment & Plan    1. Chest Pain with Mixed Features - She presents with a 5-day history of intermittent burning along her chest and throat which radiates into her left arm. She has been taking OTC antacids without resolution of her symptoms. No association with food consumption or exertional activities.  - Initial HS Troponin 42 with repeat values of 50, 106, 86 and 161. EKG shows NSR, HR 95 with no acute ST abnormalities. Repeat this AM without acute changes. Echocardiogram shows a preserved EF of 65-70% with no regional WMA.  - Reviewed with Dr. Harl Bowie and given her ongoing symptoms along with elevated troponin values, will plan for a cardiac catheterization for  definitive evaluation. The patient understands that risks include but are not limited to stroke (1 in 1000), death (1 in 59), kidney failure [usually temporary] (1 in 500), bleeding (1 in 200), allergic reaction [possibly serious] (1 in 200). Continue Atorvastatin 20mg  daily and Lopressor 25mg  BID. Will add ASA 81mg  daily and Heparin. If improvement in symptoms with SL NTG, would start IV NTG. If cardiac catheterization unrevealing, would then need to consider a GI consult given her significant symptoms.   2. History of Palpitations - She had SVT vs. sinus tachycardia in 05/2019. Maintaining NSR this admission. Continue Lopressor 25mg  BID.   3. HTN - BP initially elevated to 195/96, improved to 123/78 this AM. Continue Lopressor 25 mg twice daily (ordered as 50mg  BID but she was only taking 25mg  BID prior to admission due to prior bradycardia).  4. Type 2 DM - Hgb A1c at 7.0 this admission. SSI has been ordered.    For questions or updates, please contact [facility] Please consult [email or web address] for contact info under Cardiology/STEMI.  Signed, Erma Heritage, PA-C 04/07/2020, 9:08 AM Pager: [phone]  Attending note Patient seen and disucssed with PA Ahmed Prima, I agree with her documentation. Admitted with chest pains. Somewhat atypical symptoms however patient with significant risk factors with DM2, HTN, brother with MI in his 13s. Relatively mild troponins but significant delta 4x original trop, EKG SR nonspecific ST/T chagnes with subtle inferior and lateral precordial ST depressoin. Echo normal LVEF with no WMAs  Given risk factors, delta trop  pattern, and ongoing symptoms would plan for a definitive evaluation with a cath today. Medical therapy currently with ASA 81, atorva 20, lopressor 25mg  bid, hep gtt. After cath if CAD confirmed consider ACE/ARB.     Carlyle Dolly MD

## 2020-04-07 NOTE — Interval H&P Note (Signed)
Cath Lab Visit (complete for each Cath Lab visit)  Clinical Evaluation Leading to the Procedure:   ACS: Yes.    Non-ACS:    Anginal Classification: CCS IV  Anti-ischemic medical therapy: Minimal Therapy (1 class of medications)  Non-Invasive Test Results: No non-invasive testing performed  Prior CABG: No previous CABG      History and Physical Interval Note:  04/07/2020 12:49 PM  [name]  has presented today for surgery, with the diagnosis of chest pain.  The various methods of treatment have been discussed with the patient and family. After consideration of risks, benefits and other options for treatment, the patient has consented to  Procedure(s): LEFT HEART CATH AND CORONARY ANGIOGRAPHY (N/A) as a surgical intervention.  The patient's history has been reviewed, patient examined, no change in status, stable for surgery.  I have reviewed the patient's chart and labs.  Questions were answered to the patient's satisfaction.     Larae Grooms

## 2020-04-08 ENCOUNTER — Encounter (HOSPITAL_COMMUNITY): Payer: Self-pay | Admitting: Interventional Cardiology

## 2020-04-08 ENCOUNTER — Telehealth: Payer: Self-pay | Admitting: Family Medicine

## 2020-04-08 ENCOUNTER — Other Ambulatory Visit (HOSPITAL_COMMUNITY): Payer: Self-pay | Admitting: Physician Assistant

## 2020-04-08 DIAGNOSIS — I10 Essential (primary) hypertension: Secondary | ICD-10-CM

## 2020-04-08 DIAGNOSIS — E785 Hyperlipidemia, unspecified: Secondary | ICD-10-CM

## 2020-04-08 DIAGNOSIS — E669 Obesity, unspecified: Secondary | ICD-10-CM

## 2020-04-08 DIAGNOSIS — I2510 Atherosclerotic heart disease of native coronary artery without angina pectoris: Secondary | ICD-10-CM | POA: Diagnosis present

## 2020-04-08 DIAGNOSIS — E781 Pure hyperglyceridemia: Secondary | ICD-10-CM | POA: Diagnosis present

## 2020-04-08 DIAGNOSIS — E119 Type 2 diabetes mellitus without complications: Secondary | ICD-10-CM

## 2020-04-08 LAB — CBC
HCT: 41.3 % (ref 36.0–46.0)
Hemoglobin: 13.6 g/dL (ref 12.0–15.0)
MCH: 29.4 pg (ref 26.0–34.0)
MCHC: 32.9 g/dL (ref 30.0–36.0)
MCV: 89.4 fL (ref 80.0–100.0)
Platelets: 306 10*3/uL (ref 150–400)
RBC: 4.62 MIL/uL (ref 3.87–5.11)
RDW: 13.4 % (ref 11.5–15.5)
WBC: 12.8 10*3/uL — ABNORMAL HIGH (ref 4.0–10.5)
nRBC: 0 % (ref 0.0–0.2)

## 2020-04-08 LAB — BASIC METABOLIC PANEL
Anion gap: 10 (ref 5–15)
BUN: 12 mg/dL (ref 6–20)
CO2: 22 mmol/L (ref 22–32)
Calcium: 8.7 mg/dL — ABNORMAL LOW (ref 8.9–10.3)
Chloride: 104 mmol/L (ref 98–111)
Creatinine, Ser: 0.6 mg/dL (ref 0.44–1.00)
GFR, Estimated: >60 mL/min (ref >60)
Glucose, Bld: 152 mg/dL — ABNORMAL HIGH (ref 70–99)
Potassium: 3.9 mmol/L (ref 3.5–5.1)
Sodium: 136 mmol/L (ref 135–145)

## 2020-04-08 LAB — LIPID PANEL
Cholesterol: 177 mg/dL (ref 0–200)
HDL: 21 mg/dL — ABNORMAL LOW (ref >40)
LDL Cholesterol: 79 mg/dL (ref 0–99)
Total CHOL/HDL Ratio: 8.4 RATIO
Triglycerides: 387 mg/dL — ABNORMAL HIGH (ref <150)
VLDL: 77 mg/dL — ABNORMAL HIGH (ref 0–40)

## 2020-04-08 LAB — GLUCOSE, CAPILLARY
Glucose-Capillary: 181 mg/dL — ABNORMAL HIGH (ref 70–99)
Glucose-Capillary: 177 mg/dL — ABNORMAL HIGH (ref 70–99)

## 2020-04-08 MED ORDER — CARVEDILOL 6.25 MG PO TABS: 6.2500 mg | ORAL_TABLET | Freq: Two times a day (BID) | ORAL | 11 refills | Status: DC

## 2020-04-08 MED ORDER — TICAGRELOR 90 MG PO TABS: 90.0000 mg | ORAL_TABLET | Freq: Two times a day (BID) | ORAL | 11 refills | Status: DC

## 2020-04-08 MED ORDER — ASPIRIN EC 81 MG PO TBEC: 81.0000 mg | DELAYED_RELEASE_TABLET | Freq: Every day | ORAL | 11 refills | Status: DC

## 2020-04-08 MED ORDER — ATORVASTATIN CALCIUM 80 MG PO TABS: 80.0000 mg | ORAL_TABLET | Freq: Every day | ORAL | 6 refills | Status: DC

## 2020-04-08 MED ORDER — PANTOPRAZOLE SODIUM 40 MG PO TBEC: 40.0000 mg | DELAYED_RELEASE_TABLET | Freq: Every day | ORAL | 3 refills | Status: DC

## 2020-04-08 MED ORDER — LOSARTAN POTASSIUM 50 MG PO TABS: 50.0000 mg | ORAL_TABLET | Freq: Every day | ORAL | 11 refills | Status: DC

## 2020-04-08 MED ORDER — NITROGLYCERIN 0.4 MG SL SUBL: 0.4000 mg | SUBLINGUAL_TABLET | SUBLINGUAL | 12 refills | Status: DC | PRN

## 2020-04-08 MED FILL — BRILINTA 90 MG TABLET: 90 | 30 days supply | Qty: 60 | Fill #0

## 2020-04-08 MED FILL — ATORVASTATIN CALCIUM 80 MG: 80 | 30 days supply | Qty: 30 | Fill #0

## 2020-04-08 MED FILL — LOSARTAN POTASSIUM 50 MG TA: 50 | 30 days supply | Qty: 30 | Fill #0

## 2020-04-08 MED FILL — PANTOPRAZOLE SOD DR 40 MG T: 40 | 30 days supply | Qty: 30 | Fill #0

## 2020-04-08 MED FILL — CARVEDILOL 6.25 MG TABLET: 6.25 | 30 days supply | Qty: 60 | Fill #0

## 2020-04-08 MED FILL — ASPIRIN LOW DOSE 81 MG TBEC: 81 | 30 days supply | Qty: 30 | Fill #0

## 2020-04-08 MED FILL — NITROGLYCERIN 0.4 MG TAB SL: 0.4 | 8 days supply | Qty: 25 | Fill #0

## 2020-04-08 NOTE — Discharge Summary (Addendum)
Discharge Summary    Patient ID: [name] MRN: [id number]; DOB: 02/16/1971  Admit date: 04/06/2020 Discharge date: 04/08/2020  Primary Care Provider: Jani Gravel, MD  Primary Cardiologist: Carlyle Dolly, MD    Discharge Diagnoses    Principal Problem:   Non-ST elevation (NSTEMI) myocardial infarction [facility]) Active Problems:   Chest pain   Coronary artery disease   Diabetes mellitus without complication ([place])   Hypertension   Hypertriglyceridemia   Diagnostic Studies/Procedures     CORONARY STENT INTERVENTION  LEFT HEART CATH AND CORONARY ANGIOGRAPHY    Conclusion    Dist Cx lesion is 50% stenosed.  Mid Cx lesion is 99% stenosed. This was the culprit lesion for presentation.  A drug-eluting stent was successfully placed using a STENT RESOLUTE ONYX 3.0X22, postdilated to 3.75 mm, and optimized by IVUS  Post intervention, there is a 0% residual stenosis.  Prox RCA lesion is 70% stenosed. Cross sectional area 3.9 mm2 by IVUS.  A drug-eluting stent was successfully placed using a STENT RESOLUTE ONYX 3.5X12, postdilated to > 4 mm and optimized by IVUS.  RPDA lesion is 50% stenosed.  Post intervention, there is a 0% residual stenosis.  The left ventricular systolic function is normal.  LV end diastolic pressure is mildly elevated.  The left ventricular ejection fraction is 55-65% by visual estimate.  There is no aortic valve stenosis.   Consider clopidogrel monotherapy after 12 months of DAPT given her diffuse distal vessel disease.   Continue aggressive secondary prevention.   Diagnostic Dominance: Right    Intervention      Echo 04/06/20  1. Left ventricular ejection fraction, by estimation, is 65 to 70%. The  left ventricle has normal function. The left ventricle has no regional  wall motion abnormalities. There is mild left ventricular hypertrophy.  Left ventricular diastolic parameters  were normal.  2. Right ventricular systolic  function is normal. The right ventricular  size is normal.  3. Left atrial size was mildly dilated.  4. The mitral valve is normal in structure. No evidence of mitral valve  regurgitation. No evidence of mitral stenosis.  5. The aortic valve is tricuspid. Aortic valve regurgitation is not  visualized. No aortic stenosis is present.  6. The inferior vena cava is normal in size with greater than 50%  respiratory variability, suggesting right atrial pressure of 3 mmHg.   History of Present Illness     [name] is a 49 y.o. female with past medical history of palpitations (SVT vs. sinus tachycardia in 05/2019), HTN, Type 2 DM and family history of CAD send from [facility] for cath.   [name] presented to [facility] ED on 04/06/2020 for burning sensation along her throat and she felt the burning and aching sensation along her left arm for past few days. Symptoms would occur at rest or with activity and she is unaware of any precipitating factors. Symptoms were not associated with food consumption. She did try several OTC medications for acid reflux without improvement in her symptoms. Reports episodes can last from several minutes up to 30 minutes. Denies any associated dyspnea, orthopnea, PND or lower extremity edema. Noted  Hypertensive at 195/96.  EKG without acute findings. Admitted for further evaluation.   Hospital Course     Consultants: IM at attending at [facility]  1. NSTEMI/CAD - Initial HS Troponin 42 with repeat values of 50, 106, 86 and 161. EKG showed NSR, HR 95 with no acute ST abnormalities.  Echocardiogram showed a preserved  EF of 65-70% with no regional WMA. Despite mixed symptoms, recommended cath given risk factors and delta trop patten. Treated with IV heparin. Cath showed 99% mid Cx and 70% pRCA >> both treated with DES PCI and optimized by IVUS. Recommended aggressive medical therapy other non obstructive dz (50% dCx and 50% RPDA). DAPT with ASA and Brillinta for 12 months and  then Plavix as monotherapy. She does not have insurance. Given 30 days free supplies from [place]. Discussed importance of medications. She need assistant application as outpatient.  - Continue statin and BB  2. HLD with hypertriglyceridemia - 04/08/2020: Cholesterol 177; HDL 21; LDL Cholesterol 79; Triglycerides 387; VLDL 77  - Increase Lipitor to 80mg  qd - Discussed diet and exercise in details - Repeat labs in 6-8 weeks, if still elevated trig>> consider fenofibrate or Lovaza (She is planning to get insurance for next year)  3. HTN - Hypertensive on arrival at ER. She was not taking home metoprolol - Intermittently elevated  - Changed metoprolol to corge 6.25 mg BID and add losartan 50 mg daily.  Check BMET at follow up - Advised to keep log of blood pressure  4. DM - HgbA1c 7.0 - She will follow up with PCP to start medications - SSI while admitted     Did the patient have an acute coronary syndrome (MI, NSTEMI, STEMI, etc) this admission?:  Yes                               AHA/ACC Clinical Performance & Quality Measures: 1. Aspirin prescribed? - Yes 2. ADP Receptor Inhibitor (Plavix/Clopidogrel, Brilinta/Ticagrelor or Effient/Prasugrel) prescribed (includes medically managed patients)? - Yes 3. Beta Blocker prescribed? - Yes 4. High Intensity Statin (Lipitor 40-80mg  or Crestor 20-40mg ) prescribed? - Yes 5. EF assessed during THIS hospitalization? - Yes 6. For EF <40%, was ACEI/ARB prescribed? - Yes 7. For EF <40%, Aldosterone Antagonist (Spironolactone or Eplerenone) prescribed? - Not Applicable (EF >/= 81%) 8. Cardiac Rehab Phase II ordered (including medically managed patients)? - Yes       _____________  Discharge Vitals Blood pressure (!) 167/81, pulse (!) 56, temperature 98.4 F (36.9 C), temperature source Oral, resp. rate 18, height 5\' 5"  (1.651 m), weight 99 kg, SpO2 99 %.  Filed Weights   04/06/20 1141  Weight: 99 kg    Labs & Radiologic Studies     CBC Recent Labs    04/06/20 1230 04/08/20 0158  WBC 11.9* 12.8*  HGB 15.3* 13.6  HCT 46.5* 41.3  MCV 90.3 89.4  PLT 392 275   Basic Metabolic Panel Recent Labs    04/06/20 1230 04/08/20 0158  NA 134* 136  K 3.9 3.9  CL 100 104  CO2 22 22  GLUCOSE 168* 152*  BUN 18 12  CREATININE 0.54 0.60  CALCIUM 9.5 8.7*   Liver Function Tests Recent Labs    04/06/20 1230  AST 16  ALT 32  ALKPHOS 68  BILITOT 0.7  PROT 8.1  ALBUMIN 4.7   Recent Labs    04/06/20 1230  LIPASE 23   High Sensitivity Troponin:   Recent Labs  Lab 04/06/20 1435 04/06/20 1855 04/06/20 2105 04/07/20 0330 04/07/20 0743  TROPONINIHS 50* 106* 86* 161* 96*    BNP Invalid input(s): POCBNP D-Dimer Recent Labs    04/06/20 1230  DDIMER <0.27   Hemoglobin A1C Recent Labs    04/06/20 1230  HGBA1C 7.0*   Fasting  Lipid Panel Recent Labs    04/08/20 0158  CHOL 177  HDL 21*  LDLCALC 79  TRIG 387*  CHOLHDL 8.4   Thyroid Function Tests Recent Labs    04/06/20 1230  TSH 1.321   _____________  CARDIAC CATHETERIZATION  Result Date: 04/07/2020  Dist Cx lesion is 50% stenosed.  Mid Cx lesion is 99% stenosed. This was the culprit lesion for presentation.  A drug-eluting stent was successfully placed using a STENT RESOLUTE ONYX 3.0X22, postdilated to 3.75 mm, and optimized by IVUS  Post intervention, there is a 0% residual stenosis.  Prox RCA lesion is 70% stenosed. Cross sectional area 3.9 mm2 by IVUS.  A drug-eluting stent was successfully placed using a STENT RESOLUTE ONYX 3.5X12, postdilated to > 4 mm and optimized by IVUS.  RPDA lesion is 50% stenosed.  Post intervention, there is a 0% residual stenosis.  The left ventricular systolic function is normal.  LV end diastolic pressure is mildly elevated.  The left ventricular ejection fraction is 55-65% by visual estimate.  There is no aortic valve stenosis.  Consider clopidogrel monotherapy after 12 months of DAPT given her  diffuse distal vessel disease. Continue aggressive secondary prevention.    DG Chest Port 1 View  Result Date: 04/06/2020 CLINICAL DATA:  Chest pain EXAM: PORTABLE CHEST 1 VIEW COMPARISON:  06/21/2019 FINDINGS: The heart size and mediastinal contours are within normal limits. Both lungs are clear. The visualized skeletal structures are unremarkable. IMPRESSION: No active disease. Electronically Signed   By: Nelson Chimes M.D.   On: 04/06/2020 12:39   ECHOCARDIOGRAM COMPLETE  Result Date: 04/06/2020    ECHOCARDIOGRAM REPORT   Patient Name:   MYKALAH SAARI Date of Exam: 04/06/2020 Medical Rec #:  [id number]     Height:       65.0 in Accession #:    [id number]    Weight:       218.3 lb Date of Birth:  1970/05/16    BSA:          2.053 m Patient Age:    49 years      BP:           160/87 mmHg Patient Gender: F             HR:           69 bpm. Exam Location:  [facility] Procedure: 2D Echo, Cardiac Doppler and Color Doppler Indications:    Chest Pain 786.50 / R07.9  History:        Patient has no prior history of Echocardiogram examinations.                 Risk Factors:Hypertension.  Sonographer:    Alvino Chapel RCS Referring Phys: [id number] [place]  1. Left ventricular ejection fraction, by estimation, is 65 to 70%. The left ventricle has normal function. The left ventricle has no regional wall motion abnormalities. There is mild left ventricular hypertrophy. Left ventricular diastolic parameters were normal.  2. Right ventricular systolic function is normal. The right ventricular size is normal.  3. Left atrial size was mildly dilated.  4. The mitral valve is normal in structure. No evidence of mitral valve regurgitation. No evidence of mitral stenosis.  5. The aortic valve is tricuspid. Aortic valve regurgitation is not visualized. No aortic stenosis is present.  6. The inferior vena cava is normal in size with greater than 50% respiratory variability, suggesting right atrial pressure of  3 mmHg. FINDINGS  Left Ventricle: Left ventricular ejection fraction, by estimation, is 65 to 70%. The left ventricle has normal function. The left ventricle has no regional wall motion abnormalities. The left ventricular internal cavity size was normal in size. There is  mild left ventricular hypertrophy. Left ventricular diastolic parameters were normal. Right Ventricle: The right ventricular size is normal. No increase in right ventricular wall thickness. Right ventricular systolic function is normal. Left Atrium: Left atrial size was mildly dilated. Right Atrium: Right atrial size was normal in size. Pericardium: There is no evidence of pericardial effusion. Mitral Valve: The mitral valve is normal in structure. No evidence of mitral valve regurgitation. No evidence of mitral valve stenosis. Tricuspid Valve: The tricuspid valve is normal in structure. Tricuspid valve regurgitation is not demonstrated. No evidence of tricuspid stenosis. Aortic Valve: The aortic valve is tricuspid. Aortic valve regurgitation is not visualized. No aortic stenosis is present. Aortic valve mean gradient measures 8.0 mmHg. Aortic valve peak gradient measures 14.4 mmHg. Aortic valve area, by VTI measures 1.57  cm. Pulmonic Valve: The pulmonic valve was not well visualized. Pulmonic valve regurgitation is not visualized. No evidence of pulmonic stenosis. Aorta: The aortic root is normal in size and structure. Venous: The inferior vena cava is normal in size with greater than 50% respiratory variability, suggesting right atrial pressure of 3 mmHg. IAS/Shunts: No atrial level shunt detected by color flow Doppler.  LEFT VENTRICLE PLAX 2D LVIDd:         4.40 cm  Diastology LVIDs:         2.60 cm  LV e' medial:    7.94 cm/s LV PW:         1.20 cm  LV E/e' medial:  13.0 LV IVS:        1.20 cm  LV e' lateral:   9.46 cm/s LVOT diam:     1.60 cm  LV E/e' lateral: 10.9 LV SV:         67 LV SV Index:   32 LVOT Area:     2.01 cm  RIGHT VENTRICLE  RV S prime:     17.40 cm/s TAPSE (M-mode): 2.8 cm LEFT ATRIUM             Index       RIGHT ATRIUM           Index LA diam:        4.30 cm 2.09 cm/m  RA Area:     18.90 cm LA Vol (A2C):   65.2 ml 31.76 ml/m RA Volume:   52.10 ml  25.38 ml/m LA Vol (A4C):   69.0 ml 33.61 ml/m LA Biplane Vol: 68.3 ml 33.27 ml/m  AORTIC VALVE AV Area (Vmax):    1.74 cm AV Area (Vmean):   1.42 cm AV Area (VTI):     1.57 cm AV Vmax:           190.00 cm/s AV Vmean:          135.000 cm/s AV VTI:            0.423 m AV Peak Grad:      14.4 mmHg AV Mean Grad:      8.0 mmHg LVOT Vmax:         164.00 cm/s LVOT Vmean:        95.500 cm/s LVOT VTI:          0.331 m LVOT/AV VTI ratio: 0.78  AORTA Ao Root diam: 2.90 cm MITRAL VALVE MV Area (  PHT): 3.19 cm     SHUNTS MV Decel Time: 238 msec     Systemic VTI:  0.33 m MV E velocity: 103.00 cm/s  Systemic Diam: 1.60 cm MV A velocity: 105.00 cm/s MV E/A ratio:  0.98 Carlyle Dolly MD Electronically signed by Carlyle Dolly MD Signature Date/Time: 04/06/2020/8:33:01 PM    Final    Disposition   Pt is being discharged home today in good condition.  Follow-up Plans & Appointments     Follow-up Information    Verta Ellen., NP. Go on 04/20/2020.   Specialty: Cardiology Why: @10 :30am for hospital follow up with Dr. Nelly Laurence PA. please call if need to reschedule. [place] office does not have any appointment  Contact information: [place] [address] [phone]              Discharge Instructions    Amb Referral to Cardiac Rehabilitation   Complete by: As directed    Diagnosis:  Coronary Stents NSTEMI PTCA     After initial evaluation and assessments completed: Virtual Based Care may be provided alone or in conjunction with Phase 2 Cardiac Rehab based on patient barriers.: Yes   Diet - low sodium heart healthy   Complete by: As directed    Discharge instructions   Complete by: As directed    NO HEAVY LIFTING (>10lbs) X 2  WEEKS. NO SEXUAL ACTIVITY X 2 WEEKS. NO DRIVING X 1 WEEK. NO SOAKING BATHS, HOT TUBS, POOLS, ETC., X 7 DAYS.   Increase activity slowly   Complete by: As directed       Discharge Medications   Allergies as of 04/08/2020      Reactions   Flagyl [metronidazole] Nausea Only   Hydrocodone Nausea And Vomiting, Other (See Comments)   Can take need to take with medication for nausea and vomitting      Medication List    STOP taking these medications   ibuprofen 200 MG tablet Commonly known as: ADVIL   metoprolol tartrate 50 MG tablet Commonly known as: LOPRESSOR   potassium chloride 10 MEQ tablet Commonly known as: KLOR-CON     TAKE these medications   acetaminophen 500 MG tablet Commonly known as: TYLENOL Take 500 mg by mouth every 6 (six) hours as needed for mild pain.   aspirin EC 81 MG tablet Take 1 tablet (81 mg total) by mouth daily. Swallow whole.   atorvastatin 80 MG tablet Commonly known as: LIPITOR Take 1 tablet (80 mg total) by mouth daily at 6 PM.   carvedilol 6.25 MG tablet Commonly known as: Coreg Take 1 tablet (6.25 mg total) by mouth 2 (two) times daily.   gabapentin 300 MG capsule Commonly known as: NEURONTIN Take 1 capsule (300 mg total) by mouth 3 (three) times daily.   losartan 50 MG tablet Commonly known as: Cozaar Take 1 tablet (50 mg total) by mouth daily.   nitroGLYCERIN 0.4 MG SL tablet Commonly known as: NITROSTAT Place 1 tablet (0.4 mg total) under the tongue every 5 (five) minutes as needed for chest pain.   pantoprazole 40 MG tablet Commonly known as: PROTONIX Take 1 tablet (40 mg total) by mouth daily. Further refills by PCP   ticagrelor 90 MG Tabs tablet Commonly known as: BRILINTA Take 1 tablet (90 mg total) by mouth 2 (two) times daily.          Outstanding Labs/Studies   Lipid panel and LFTs in 6-8 weeks   Duration of Discharge  Encounter   Greater than 30 minutes including physician time.  Jarrett Soho, PA 04/08/2020, 10:31 AM   Patient seen and examined.  Agree with above documentation.  Ms. Harr is a 49 year old female with a history of hypertension, T2DM who presented with NSTEMI.  Presented with burning sensation in throat/arm.  Mild troponin elevation (106).  Echocardiogram showed normal LVEF.  Cardiac catheterization was done and showed severe mid circumflex and proximal RCA stenosis, treated with DES x2.  Started on aspirin and Brilinta.  LDL 79, Lipitor was increased to 80 mg daily.  BP elevated throughout admission, losartan 50 mg daily was added and metoprolol was changed to Coreg 6.25 mg twice daily.  On exam:  GEN: in no acute distress HEENT: normal Neck: no JVD Cardiac: RRR; no murmurs, rubs, or gallops,no edema  Respiratory: clear to auscultation bilaterally, normal work of breathing GI: soft, nontender, nondistended, + BS MS: no deformity or atrophy Skin: warm and dry, no rash Neuro:  Alert and Oriented x 3, Strength and sensation are intact Psych: normal affect  Stable for discharge.  Follow-up scheduled for 12/27.  Donato Heinz, MD

## 2020-04-08 NOTE — Care Management (Signed)
1059 04-08-20 MATCH has been completed for this patient. Patient states she will have insurance shortly. Patient has PCP in [facility] Cardiology will see the patient outpatient and assist with the Brilinta patient assistance application. Medications to be delivered via TOC.  Bethena Roys, RN,BSN, Case Manager

## 2020-04-08 NOTE — Progress Notes (Signed)
CARDIAC REHAB PHASE I   PRE:  Rate/Rhythm: 80 SR    BP: sitting 148/81    SaO2: 100 RA  MODE:  Ambulation: 470 ft   POST:  Rate/Rhythm: 77 SR    BP: sitting 141/80     SaO2:   Pt still endorses throat pain/tightness. Sts it has not resolved since stent but has gotten better. No change with ambulation. VSS, no other c/o. Discussed MI, stents, restrictions, Brilinta, smoking cessation, diet, exercise, NTG and CRPII. Pt very receptive, eager to make life change. She wants to quit smoking and we discussed methods and plan. Also newly elevated A1C. Discussed diet change and referred to DM coordinator who will come see her. Will refer to [place].  [id number]  [place], ACSM 04/08/2020 10:21 AM

## 2020-04-08 NOTE — Telephone Encounter (Signed)
     TOC scheduled with Levell July on 04/20/20 at 10:30 am. Scheduled by Robbie Lis

## 2020-04-09 ENCOUNTER — Telehealth: Payer: Self-pay

## 2020-04-09 NOTE — Telephone Encounter (Signed)
[name], Chicken  [name] of Dr. Harl Bowie  He will need Pattricia Boss assistant as outpatient  Mailed patient assistant form to patient. Patient was told how to fill out the form and verbalized understanding.

## 2020-04-10 NOTE — Telephone Encounter (Signed)
Patient contacted regarding discharge from [facility] on 04/08/2020.  Patient understands to follow up with provider Katina Dung NP on 04/20/2020 at 10:30 am  At [facility]. Patient understands her discharge instructions. Patient understands her medications and regimen. Patient understands to bring all of her medications to this visit.

## 2020-04-19 NOTE — Progress Notes (Signed)
Cardiology Office Note  Date: 04/20/2020   ID: Bridget Abubra J Arno, DOB 1970-10-18, MRN [id number]  PCP:  Pearson GrippeKim, James, MD  Cardiologist:  Dina RichBranch, Jonathan, MD Electrophysiologist:  None   Chief Complaint: Hosptial follow up S/P NSTEMI.  History of Present Illness: [name] is a 49 y.o. female with a history of NSTEMI , chest pain,  CAD, DM2, HTN, Hypertriglyceridemia. Hx palpitations (SVT vs sinus tachycardia), current smoker.  She presented to [facility] 04/06/2020 for burning sensation long her throat and with burning / aching sensation along her left arm for the prior few days. Occurred with or without activity. Episodes lasted a few minutes up to 30 minutes.  Troponins  42>50>106>86>161. Echo EF 70% no RWMAs. Cardiac catheterization : Mid Lcx 99%, 70% pRCA with DES. Residual disease distal Cx 50%, 50% RPDA. DAPT therapy with ASA and Brillinta 1 year, then plavix as monotherapy. Lipitor was increased to 80 mg daily. To repeat Lipids in 6 -8 weeks. Consider fenofibrate or Lovasa. She was hypertensive on arrival  To hospital. She was not taking her metoprolol. She was started on Coreg 6.25 mg po bid and Losartan 50 mg daily. To have BMP at follow up.   She is here for follow-up today status post recent cardiac catheterization.  She states she notices an occasional twinge of pain in her chest but attributes this to anxiety.  States her symptoms are much better since the cardiac catheterization.  Denies any burning in her throat or burning/aching in her left arm as before PCI.  Denies any DOE, S OB, orthostatic symptoms, CVA or TIA-like symptoms, PND, orthopnea, lower extremity edema, claudication-like symptoms, DVT or PE-like symptoms.  Right radial access site is clean and dry with good pulses.  Past Medical History:  Diagnosis Date  . Arthritis   . Carpal tunnel syndrome, bilateral   . Coronary artery disease   . Diabetes mellitus without complication (HCC)    no meds; diet controlled  .  Diverticulitis   . Hypertension   . Hypertriglyceridemia   . PONV (postoperative nausea and vomiting)   . Sciatica     Past Surgical History:  Procedure Laterality Date  . ablasion of uterus    . CARPAL TUNNEL RELEASE  11/10/2010   Procedure: CARPAL TUNNEL RELEASE;  Surgeon: Fuller CanadaStanley Harrison, MD;  Location: [facility];  Service: Orthopedics;  Laterality: Right;  . CHOLECYSTECTOMY     [facility]  . COLON RESECTION N/A 04/08/2015   Procedure: LAPAROSCOPIC HAND ASSISTED PARTIAL COLECTOMY  CONVERTED TO OPEN AT [id number];  Surgeon: Franky MachoMark Jenkins, MD;  Location: [facility];  Service: General;  Laterality: N/A;  . COLONOSCOPY N/A 03/28/2014   Procedure: COLONOSCOPY;  Surgeon: Malissa HippoNajeeb U Rehman, MD;  Location: [facility];  Service: Endoscopy;  Laterality: N/A;  730  . CORONARY STENT INTERVENTION  04/07/2020  . CORONARY STENT INTERVENTION N/A 04/07/2020   Procedure: CORONARY STENT INTERVENTION;  Surgeon: Corky CraftsVaranasi, Jayadeep S, MD;  [facility];  Service: Cardiovascular;  Laterality: N/A;  . KNEE ARTHROSCOPY WITH DRILLING/MICROFRACTURE Left 10/20/2016   Procedure: LEFT KNEE ARTHROSCOPY WITH MICROFRACTURE;  Surgeon: Vickki HearingHarrison, Stanley E, MD;  Location: [facility];  Service: Orthopedics;  Laterality: Left;  . KNEE ARTHROSCOPY WITH DRILLING/MICROFRACTURE Left 04/27/2017   Procedure: KNEE ARTHROSCOPY WITH DRILLING/MICROFRACTURE;  Surgeon: Vickki HearingHarrison, Stanley E, MD;  Location: [facility];  Service: Orthopedics;  Laterality: Left;  . KNEE ARTHROSCOPY WITH OSTEOCHONDRAL DEFECT REPAIR Left 04/27/2017   Procedure: KNEE ARTHROSCOPY WITH OSTEOCHONDRAL DEFECT REPAIR  Autograft;  Surgeon: Carole Civil, MD;  Location: [facility];  Service: Orthopedics;  Laterality: Left;  . KNEE SURGERY    . LEFT HEART CATH AND CORONARY ANGIOGRAPHY N/A 04/07/2020   Procedure: LEFT HEART CATH AND CORONARY ANGIOGRAPHY;  Surgeon: Jettie Booze, MD;  Location: [facility];  Service: Cardiovascular;  Laterality: N/A;  . PARTIAL COLECTOMY  N/A 04/08/2015   Procedure: PARTIAL COLECTOMY;  Surgeon: Aviva Signs, MD;  Location: [facility];  Service: General;  Laterality: N/A;  . right knee arthroscopy    . TOTAL KNEE ARTHROPLASTY Left 10/17/2017   Procedure: TOTAL KNEE ARTHROPLASTY;  Surgeon: Carole Civil, MD;  Location: [facility];  Service: Orthopedics;  Laterality: Left;  [place]   . TUBAL LIGATION      Current Outpatient Medications  Medication Sig Dispense Refill  . acetaminophen (TYLENOL) 500 MG tablet Take 500 mg by mouth every 6 (six) hours as needed for mild pain.    Marland Kitchen aspirin EC 81 MG tablet Take 1 tablet (81 mg total) by mouth daily. Swallow whole. 30 tablet 11  . atorvastatin (LIPITOR) 80 MG tablet Take 1 tablet (80 mg total) by mouth daily at 6 PM. 30 tablet 6  . carvedilol (COREG) 6.25 MG tablet Take 1 tablet (6.25 mg total) by mouth 2 (two) times daily. 60 tablet 11  . losartan (COZAAR) 50 MG tablet Take 1 tablet (50 mg total) by mouth daily. 30 tablet 11  . nitroGLYCERIN (NITROSTAT) 0.4 MG SL tablet Place 1 tablet (0.4 mg total) under the tongue every 5 (five) minutes as needed for chest pain. 25 tablet 12  . pantoprazole (PROTONIX) 40 MG tablet Take 1 tablet (40 mg total) by mouth daily. Further refills by PCP 30 tablet 3  . ticagrelor (BRILINTA) 90 MG TABS tablet Take 1 tablet (90 mg total) by mouth 2 (two) times daily. 60 tablet 11   No current facility-administered medications for this visit.   Allergies:  Flagyl [metronidazole] and Hydrocodone   Social History: The patient  reports that she quit smoking about 2 weeks ago. Her smoking use included cigarettes. She has a 26.00 pack-year smoking history. She has never used smokeless tobacco. She reports that she does not drink alcohol and does not use drugs.   Family History: The patient's family history includes Arthritis in an other family member; Asthma in an other family member; Cancer in an other family member; Diabetes in an other family member; Diabetes type I  in her mother; Diabetes type II in her brother, brother, and father; Hyperlipidemia in her father and mother; Hypertension in her father.   ROS:  Please see the history of present illness. Otherwise, complete review of systems is positive for none.  All other systems are reviewed and negative.   Physical Exam: VS:  BP (!) 142/80   Pulse 71   Ht 5\' 5"  (1.651 m)   Wt 214 lb (97.1 kg)   SpO2 95%   BMI 35.61 kg/m , BMI Body mass index is 35.61 kg/m.  Wt Readings from Last 3 Encounters:  04/20/20 214 lb (97.1 kg)  04/06/20 218 lb 4.1 oz (99 kg)  06/21/19 195 lb (88.5 kg)    General: Obese patient appears comfortable at rest. Neck: Supple, no elevated JVP or carotid bruits, no thyromegaly. Lungs: Clear to auscultation, nonlabored breathing at rest. Cardiac: Regular rate and rhythm, no S3 or significant systolic murmur, no pericardial rub. Extremities: No pitting edema, distal pulses 2+. Skin: Warm and dry.  Right radial access site clean and dry without redness or swelling 2+ pulses Musculoskeletal: No kyphosis. Neuropsychiatric: Alert and oriented x3, affect grossly appropriate.  ECG:  EKG 04/07/2020 sinus bradycardia rate of 53.  Recent Labwork: 04/06/2020: ALT 32; AST 16; TSH 1.321 04/08/2020: BUN 12; Creatinine, Ser 0.60; Hemoglobin 13.6; Platelets 306; Potassium 3.9; Sodium 136     Component Value Date/Time   CHOL 177 04/08/2020 0158   TRIG 387 (H) 04/08/2020 0158   HDL 21 (L) 04/08/2020 0158   CHOLHDL 8.4 04/08/2020 0158   VLDL 77 (H) 04/08/2020 0158   LDLCALC 79 04/08/2020 0158    Other Studies Reviewed Today:   CORONARY STENT INTERVENTION  LEFT HEART CATH AND CORONARY ANGIOGRAPHY    Conclusion  Dist Cx lesion is 50% stenosed.  Mid Cx lesion is 99% stenosed. This was the culprit lesion for presentation.  A drug-eluting stent was successfully placed using a STENT RESOLUTE ONYX 3.0X22, postdilated to 3.75 mm, and optimized by IVUS  Post intervention, there  is a 0% residual stenosis.  Prox RCA lesion is 70% stenosed. Cross sectional area 3.9 mm2 by IVUS.  A drug-eluting stent was successfully placed using a STENT RESOLUTE ONYX 3.5X12, postdilated to > 4 mm and optimized by IVUS.  RPDA lesion is 50% stenosed.  Post intervention, there is a 0% residual stenosis.  The left ventricular systolic function is normal.  LV end diastolic pressure is mildly elevated.  The left ventricular ejection fraction is 55-65% by visual estimate.  There is no aortic valve stenosis.  Consider clopidogrel monotherapy after 12 months of DAPT given her diffuse distal vessel disease.   Continue aggressive secondary prevention.      Intervention     Echo 04/06/20  1. Left ventricular ejection fraction, by estimation, is 65 to 70%. The  left ventricle has normal function. The left ventricle has no regional  wall motion abnormalities. There is mild left ventricular hypertrophy.  Left ventricular diastolic parameters  were normal.  2. Right ventricular systolic function is normal. The right ventricular  size is normal.  3. Left atrial size was mildly dilated.  4. The mitral valve is normal in structure. No evidence of mitral valve  regurgitation. No evidence of mitral stenosis.  5. The aortic valve is tricuspid. Aortic valve regurgitation is not  visualized. No aortic stenosis is present.  6. The inferior vena cava is normal in size with greater than 50%  respiratory variability, suggesting right atrial pressure of 3 mmHg.    Assessment and Plan:  1. Non-ST elevation (NSTEMI) myocardial infarction ([place])   2. Mixed hyperlipidemia   3. Essential hypertension   4. Type 2 diabetes mellitus without complication, without long-term current use of insulin ([place])    1. Non-ST elevation (NSTEMI) myocardial infarction [facility]) Recent NSTEMI with PCI/DES to Mid circumflex and proximal RCA.  Had residual disease in distal circumflex of 50% and RPDA  lesion 50%.  Continue aspirin 81 mg daily.  Continue Brilinta 90 mg p.o. twice daily.  Continue sublingual nitroglycerin as needed.  Increase Coreg to 12.5 mg p.o. twice daily.  Get a basic metabolic panel and magnesium.  2. Mixed hyperlipidemia Continue atorvastatin 80 mg p.o. daily.  Get lipid panel and LFTs in 6 to 8 weeks.  3. Essential hypertension Blood pressure 142/80 today.  We are increasing carvedilol to 12.5 mg p.o. twice daily.  Continue losartan 50 mg p.o. daily.  4. Type 2 diabetes mellitus without complication, without long-term current use of insulin ([place])  Patient has diabetes and not on any antihyperglycemics medications.  Please refer to Dr. Dorris Fetch endocrinology for management of diabetes.  Last random glucose level 04/08/2020 was 177.   Medication Adjustments/Labs and Tests Ordered: Current medicines are reviewed at length with the patient today.  Concerns regarding medicines are outlined above.   Disposition: Follow-up with Dr. Harl Bowie or APP 1 month Signed, Levell July, NP 04/20/2020 11:01 AM    [facility] [address], [place], [place] [address] Phone: [phone]; Fax: [phone]

## 2020-04-20 ENCOUNTER — Other Ambulatory Visit (HOSPITAL_COMMUNITY)
Admission: RE | Admit: 2020-04-20 | Discharge: 2020-04-20 | Disposition: A | Discharge status: Home / Self Care | Payer: Self-pay | Source: Ambulatory Visit | Attending: Family Medicine | Admitting: Family Medicine

## 2020-04-20 ENCOUNTER — Ambulatory Visit (INDEPENDENT_AMBULATORY_CARE_PROVIDER_SITE_OTHER): Payer: Self-pay | Admitting: Family Medicine

## 2020-04-20 ENCOUNTER — Other Ambulatory Visit: Payer: Self-pay

## 2020-04-20 ENCOUNTER — Encounter: Payer: Self-pay | Admitting: Family Medicine

## 2020-04-20 VITALS — BP 142/80 | HR 71 | Ht 65.0 in | Wt 214.0 lb

## 2020-04-20 DIAGNOSIS — E782 Mixed hyperlipidemia: Secondary | ICD-10-CM | POA: Insufficient documentation

## 2020-04-20 DIAGNOSIS — E119 Type 2 diabetes mellitus without complications: Secondary | ICD-10-CM | POA: Insufficient documentation

## 2020-04-20 DIAGNOSIS — I214 Non-ST elevation (NSTEMI) myocardial infarction: Secondary | ICD-10-CM

## 2020-04-20 DIAGNOSIS — I10 Essential (primary) hypertension: Secondary | ICD-10-CM

## 2020-04-20 LAB — BASIC METABOLIC PANEL
Anion gap: 9 (ref 5–15)
BUN: 12 mg/dL (ref 6–20)
CO2: 22 mmol/L (ref 22–32)
Calcium: 9 mg/dL (ref 8.9–10.3)
Chloride: 102 mmol/L (ref 98–111)
Creatinine, Ser: 0.54 mg/dL (ref 0.44–1.00)
GFR, Estimated: >60 mL/min (ref >60)
Glucose, Bld: 225 mg/dL — ABNORMAL HIGH (ref 70–99)
Potassium: 4 mmol/L (ref 3.5–5.1)
Sodium: 133 mmol/L — ABNORMAL LOW (ref 135–145)

## 2020-04-20 MED ORDER — CARVEDILOL 12.5 MG PO TABS: 12.5000 mg | ORAL_TABLET | Freq: Two times a day (BID) | ORAL | 3 refills | Status: DC

## 2020-04-20 NOTE — Patient Instructions (Signed)
Medication Instructions:  Your physician has recommended you make the following change in your medication:   INCREASE: Carvedilol to 12.5mg  twice daily  *If you need a refill on your cardiac medications before your next appointment, please call your pharmacy*   Lab Work: BMET now---Lipid, LFT's in 3 months  If you have labs (blood work) drawn today and your tests are completely normal, you will receive your results only by: Marland Kitchen MyChart Message (if you have MyChart) OR . A paper copy in the mail If you have any lab test that is abnormal or we need to change your treatment, we will call you to review the results.  Follow-Up: At [facility], you and your health needs are our priority.  As part of our continuing mission to provide you with exceptional heart care, we have created designated Provider Care Teams.  These Care Teams include your primary Cardiologist (physician) and Advanced Practice Providers (APPs -  Physician Assistants and Nurse Practitioners) who all work together to provide you with the care you need, when you need it.  Your next appointment:   1 month(s)  The format for your next appointment:   In Person  Provider:   Nena Polio, NP   Other Instructions You have been referred to an Endocrinologist, Dr Fransico Him. They will call you to schedule an appt.

## 2020-05-04 ENCOUNTER — Telehealth: Payer: Self-pay | Admitting: Cardiology

## 2020-05-04 MED ORDER — CARVEDILOL 12.5 MG PO TABS: 12.5000 mg | ORAL_TABLET | Freq: Two times a day (BID) | ORAL | 1 refills | Status: DC

## 2020-05-04 MED ORDER — LOSARTAN POTASSIUM 50 MG PO TABS: 50.0000 mg | ORAL_TABLET | Freq: Every day | ORAL | 1 refills | Status: DC

## 2020-05-04 MED ORDER — TICAGRELOR 90 MG PO TABS: 90.0000 mg | ORAL_TABLET | Freq: Two times a day (BID) | ORAL | 11 refills | Status: DC

## 2020-05-04 NOTE — Telephone Encounter (Signed)
*  STAT* If patient is at the pharmacy, call can be transferred to refill team.   1. Which medications need to be refilled? (please list name of each medication and dose if known)  ticagrelor (BRILINTA) 90 MG TABS tablet [[id number]]   atorvastatin (LIPITOR) 80 MG tablet [[id number]]   losartan (COZAAR) 50 MG tablet [[id number]]   2. Which pharmacy/location (including street and city if local pharmacy) is medication to be sent to? [place]  3. Do they need a 30 day or 90 day supply?  90 day  Pt will be out of medication by the end of the week, these Rx's were filled while she was admitted.

## 2020-05-04 NOTE — Telephone Encounter (Signed)
Medication sent to pharmacy  

## 2020-05-06 ENCOUNTER — Telehealth: Payer: Self-pay | Admitting: Family Medicine

## 2020-05-06 MED ORDER — ATORVASTATIN CALCIUM 80 MG PO TABS
80.0000 mg | ORAL_TABLET | Freq: Every day | ORAL | 6 refills | Status: DC
Start: 2020-05-06 — End: 2020-12-08

## 2020-05-06 NOTE — Telephone Encounter (Signed)
Please call in to [facility] in [place]

## 2020-05-07 ENCOUNTER — Encounter: Payer: Self-pay | Admitting: Nurse Practitioner

## 2020-05-07 ENCOUNTER — Other Ambulatory Visit: Payer: Self-pay

## 2020-05-07 ENCOUNTER — Ambulatory Visit (INDEPENDENT_AMBULATORY_CARE_PROVIDER_SITE_OTHER): Payer: 59 | Admitting: Nurse Practitioner

## 2020-05-07 VITALS — BP 134/81 | HR 55 | Ht 65.0 in | Wt 215.4 lb

## 2020-05-07 DIAGNOSIS — E1165 Type 2 diabetes mellitus with hyperglycemia: Secondary | ICD-10-CM | POA: Diagnosis not present

## 2020-05-07 DIAGNOSIS — I10 Essential (primary) hypertension: Secondary | ICD-10-CM | POA: Diagnosis not present

## 2020-05-07 DIAGNOSIS — E781 Pure hyperglyceridemia: Secondary | ICD-10-CM

## 2020-05-07 MED ORDER — ONETOUCH VERIO VI STRP: ORAL_STRIP | 12 refills | Status: AC

## 2020-05-07 MED ORDER — METFORMIN HCL ER 500 MG PO TB24: 500.0000 mg | ORAL_TABLET | Freq: Every day | ORAL | 3 refills | Status: DC

## 2020-05-07 MED ORDER — ONETOUCH ULTRASOFT LANCETS MISC: 12 refills | Status: DC

## 2020-05-07 NOTE — Patient Instructions (Signed)
Diabetes Mellitus and Nutrition, Adult When you have diabetes, or diabetes mellitus, it is very important to have healthy eating habits because your blood sugar (glucose) levels are greatly affected by what you eat and drink. Eating healthy foods in the right amounts, at about the same times every day, can help you:  Control your blood glucose.  Lower your risk of heart disease.  Improve your blood pressure.  Reach or maintain a healthy weight. What can affect my meal plan? Every person with diabetes is different, and each person has different needs for a meal plan. Your health care provider may recommend that you work with a dietitian to make a meal plan that is best for you. Your meal plan may vary depending on factors such as:  The calories you need.  The medicines you take.  Your weight.  Your blood glucose, blood pressure, and cholesterol levels.  Your activity level.  Other health conditions you have, such as heart or kidney disease. How do carbohydrates affect me? Carbohydrates, also called carbs, affect your blood glucose level more than any other type of food. Eating carbs naturally raises the amount of glucose in your blood. Carb counting is a method for keeping track of how many carbs you eat. Counting carbs is important to keep your blood glucose at a healthy level, especially if you use insulin or take certain oral diabetes medicines. It is important to know how many carbs you can safely have in each meal. This is different for every person. Your dietitian can help you calculate how many carbs you should have at each meal and for each snack. How does alcohol affect me? Alcohol can cause a sudden decrease in blood glucose (hypoglycemia), especially if you use insulin or take certain oral diabetes medicines. Hypoglycemia can be a life-threatening condition. Symptoms of hypoglycemia, such as sleepiness, dizziness, and confusion, are similar to symptoms of having too much  alcohol.  Do not drink alcohol if: ? Your health care provider tells you not to drink. ? You are pregnant, may be pregnant, or are planning to become pregnant.  If you drink alcohol: ? Do not drink on an empty stomach. ? Limit how much you use to:  0-1 drink a day for women.  0-2 drinks a day for men. ? Be aware of how much alcohol is in your drink. In the [place] one drink equals one 12 oz bottle of beer (355 mL), one 5 oz glass of wine (148 mL), or one 1 oz glass of hard liquor (44 mL). ? Keep yourself hydrated with water, diet soda, or unsweetened iced tea.  Keep in mind that regular soda, juice, and other mixers may contain a lot of sugar and must be counted as carbs. What are tips for following this plan? Reading food labels  Start by checking the serving size on the "Nutrition Facts" label of packaged foods and drinks. The amount of calories, carbs, fats, and other nutrients listed on the label is based on one serving of the item. Many items contain more than one serving per package.  Check the total grams (g) of carbs in one serving. You can calculate the number of servings of carbs in one serving by dividing the total carbs by 15. For example, if a food has 30 g of total carbs per serving, it would be equal to 2 servings of carbs.  Check the number of grams (g) of saturated fats and trans fats in one serving. Choose foods that have   a low amount or none of these fats.  Check the number of milligrams (mg) of salt (sodium) in one serving. Most people should limit total sodium intake to less than 2,300 mg per day.  Always check the nutrition information of foods labeled as "low-fat" or "nonfat." These foods may be higher in added sugar or refined carbs and should be avoided.  Talk to your dietitian to identify your daily goals for nutrients listed on the label. Shopping  Avoid buying canned, pre-made, or processed foods. These foods tend to be high in fat, sodium, and added  sugar.  Shop around the outside edge of the grocery store. This is where you will most often find fresh fruits and vegetables, bulk grains, fresh meats, and fresh dairy. Cooking  Use low-heat cooking methods, such as baking, instead of high-heat cooking methods like deep frying.  Cook using healthy oils, such as olive, canola, or sunflower oil.  Avoid cooking with butter, cream, or high-fat meats. Meal planning  Eat meals and snacks regularly, preferably at the same times every day. Avoid going long periods of time without eating.  Eat foods that are high in fiber, such as fresh fruits, vegetables, beans, and whole grains. Talk with your dietitian about how many servings of carbs you can eat at each meal.  Eat 4-6 oz (112-168 g) of lean protein each day, such as lean meat, chicken, fish, eggs, or tofu. One ounce (oz) of lean protein is equal to: ? 1 oz (28 g) of meat, chicken, or fish. ? 1 egg. ?  cup (62 g) of tofu.  Eat some foods each day that contain healthy fats, such as avocado, nuts, seeds, and fish.   What foods should I eat? Fruits Berries. Apples. Oranges. Peaches. Apricots. Plums. Grapes. Mango. Papaya. Pomegranate. Kiwi. Cherries. Vegetables Lettuce. Spinach. Leafy greens, including kale, chard, collard greens, and mustard greens. Beets. Cauliflower. Cabbage. Broccoli. Carrots. Green beans. Tomatoes. Peppers. Onions. Cucumbers. Brussels sprouts. Grains Whole grains, such as whole-wheat or whole-grain bread, crackers, tortillas, cereal, and pasta. Unsweetened oatmeal. Quinoa. Brown or wild rice. Meats and other proteins Seafood. Poultry without skin. Lean cuts of poultry and beef. Tofu. Nuts. Seeds. Dairy Low-fat or fat-free dairy products such as milk, yogurt, and cheese. The items listed above may not be a complete list of foods and beverages you can eat. Contact a dietitian for more information. What foods should I avoid? Fruits Fruits canned with  syrup. Vegetables Canned vegetables. Frozen vegetables with butter or cream sauce. Grains Refined white flour and flour products such as bread, pasta, snack foods, and cereals. Avoid all processed foods. Meats and other proteins Fatty cuts of meat. Poultry with skin. Breaded or fried meats. Processed meat. Avoid saturated fats. Dairy Full-fat yogurt, cheese, or milk. Beverages Sweetened drinks, such as soda or iced tea. The items listed above may not be a complete list of foods and beverages you should avoid. Contact a dietitian for more information. Questions to ask a health care provider  Do I need to meet with a diabetes educator?  Do I need to meet with a dietitian?  What number can I call if I have questions?  When are the best times to check my blood glucose? Where to find more information:  American Diabetes Association: diabetes.org  Academy of Nutrition and Dietetics: www.eatright.org  National Institute of Diabetes and Digestive and Kidney Diseases: www.niddk.nih.gov  Association of Diabetes Care and Education Specialists: www.diabeteseducator.org Summary  It is important to have healthy eating   habits because your blood sugar (glucose) levels are greatly affected by what you eat and drink.  A healthy meal plan will help you control your blood glucose and maintain a healthy lifestyle.  Your health care provider may recommend that you work with a dietitian to make a meal plan that is best for you.  Keep in mind that carbohydrates (carbs) and alcohol have immediate effects on your blood glucose levels. It is important to count carbs and to use alcohol carefully. This information is not intended to replace advice given to you by your health care provider. Make sure you discuss any questions you have with your health care provider. Document Revised: 03/19/2019 Document Reviewed: 03/19/2019 Elsevier Patient Education  2021 [employer] Inc.  

## 2020-05-07 NOTE — Progress Notes (Signed)
Endocrinology Consult Note       05/07/2020, 2:49 PM   Subjective:    Patient ID: [name], female    DOB: Apr 17, 1971.  [name] is being seen in consultation for management of currently uncontrolled symptomatic diabetes requested by  Jani Gravel, MD.   Past Medical History:  Diagnosis Date  . Arthritis   . Carpal tunnel syndrome, bilateral   . Coronary artery disease   . Diabetes mellitus without complication ([place])    no meds; diet controlled  . Diverticulitis   . Hypertension   . Hypertriglyceridemia   . PONV (postoperative nausea and vomiting)   . Sciatica     Past Surgical History:  Procedure Laterality Date  . ablasion of uterus    . CARPAL TUNNEL RELEASE  11/10/2010   Procedure: CARPAL TUNNEL RELEASE;  Surgeon: Arther Abbott, MD;  Location: [facility];  Service: Orthopedics;  Laterality: Right;  . CHOLECYSTECTOMY     [facility]  . COLON RESECTION N/A 04/08/2015   Procedure: LAPAROSCOPIC HAND ASSISTED PARTIAL COLECTOMY  CONVERTED TO OPEN AT [id number];  Surgeon: Aviva Signs, MD;  Location: [facility];  Service: General;  Laterality: N/A;  . COLONOSCOPY N/A 03/28/2014   Procedure: COLONOSCOPY;  Surgeon: Rogene Houston, MD;  Location: [facility];  Service: Endoscopy;  Laterality: N/A;  730  . CORONARY STENT INTERVENTION  04/07/2020  . CORONARY STENT INTERVENTION N/A 04/07/2020   Procedure: CORONARY STENT INTERVENTION;  Surgeon: Jettie Booze, MD;  Location: [facility];  Service: Cardiovascular;  Laterality: N/A;  . KNEE ARTHROSCOPY WITH DRILLING/MICROFRACTURE Left 10/20/2016   Procedure: LEFT KNEE ARTHROSCOPY WITH MICROFRACTURE;  Surgeon: Carole Civil, MD;  Location: [facility];  Service: Orthopedics;  Laterality: Left;  . KNEE ARTHROSCOPY WITH DRILLING/MICROFRACTURE Left 04/27/2017   Procedure: KNEE ARTHROSCOPY WITH DRILLING/MICROFRACTURE;  Surgeon: Carole Civil, MD;  Location: [facility];  Service: Orthopedics;  Laterality: Left;  . KNEE ARTHROSCOPY WITH OSTEOCHONDRAL DEFECT REPAIR Left 04/27/2017   Procedure: KNEE ARTHROSCOPY WITH OSTEOCHONDRAL DEFECT REPAIR Autograft;  Surgeon: Carole Civil, MD;  Location: [facility];  Service: Orthopedics;  Laterality: Left;  . KNEE SURGERY    . LEFT HEART CATH AND CORONARY ANGIOGRAPHY N/A 04/07/2020   Procedure: LEFT HEART CATH AND CORONARY ANGIOGRAPHY;  Surgeon: Jettie Booze, MD;  Location: [facility];  Service: Cardiovascular;  Laterality: N/A;  . PARTIAL COLECTOMY N/A 04/08/2015   Procedure: PARTIAL COLECTOMY;  Surgeon: Aviva Signs, MD;  Location: [facility];  Service: General;  Laterality: N/A;  . right knee arthroscopy    . TOTAL KNEE ARTHROPLASTY Left 10/17/2017   Procedure: TOTAL KNEE ARTHROPLASTY;  Surgeon: Carole Civil, MD;  Location: [facility];  Service: Orthopedics;  Laterality: Left;  [place]   . TUBAL LIGATION      Social History   Socioeconomic History  . Marital status: Divorced    Spouse name: Not on file  . Number of children: Not on file  . Years of education: college  . Highest education level: Not on file  Occupational History  . Occupation: Optician, dispensing: [employer]  Tobacco Use  . Smoking status: Former  Smoker    Packs/day: 1.00    Years: 26.00    Pack years: 26.00    Types: Cigarettes    Quit date: 04/05/2020    Years since quitting: 0.0  . Smokeless tobacco: Never Used  Vaping Use  . Vaping Use: Never used  Substance and Sexual Activity  . Alcohol use: No    Alcohol/week: 0.0 standard drinks  . Drug use: No  . Sexual activity: Yes    Birth control/protection: Surgical  Other Topics Concern  . Not on file  Social History Narrative  . Not on file   Social Determinants of Health   Financial Resource Strain: Not on file  Food Insecurity: Not on file  Transportation Needs: Not on file  Physical Activity: Not on file  Stress: Not on file  Social Connections: Not on  file    Family History  Problem Relation Age of Onset  . Diabetes type I Mother   . Hyperlipidemia Mother   . Diabetes type II Father   . Hypertension Father   . Hyperlipidemia Father   . Diabetes type II Brother   . Diabetes type II Brother   . Cancer Other   . Diabetes Other   . Arthritis Other   . Asthma Other   . Anesthesia problems Neg Hx     Outpatient Encounter Medications as of 05/07/2020  Medication Sig  . acetaminophen (TYLENOL) 500 MG tablet Take 500 mg by mouth every 6 (six) hours as needed for mild pain.  Marland Kitchen aspirin EC 81 MG tablet Take 1 tablet (81 mg total) by mouth daily. Swallow whole.  Marland Kitchen atorvastatin (LIPITOR) 80 MG tablet Take 1 tablet (80 mg total) by mouth daily at 6 PM.  . carvedilol (COREG) 12.5 MG tablet Take 1 tablet (12.5 mg total) by mouth 2 (two) times daily.  Marland Kitchen glucose blood (ONETOUCH VERIO) test strip Use as instructed to monitor blood glucose once daily.  . Lancets (ONETOUCH ULTRASOFT) lancets Use as instructed to monitor glucose once daily  . losartan (COZAAR) 50 MG tablet Take 1 tablet (50 mg total) by mouth daily.  . metFORMIN (GLUCOPHAGE XR) 500 MG 24 hr tablet Take 1 tablet (500 mg total) by mouth daily with breakfast.  . nitroGLYCERIN (NITROSTAT) 0.4 MG SL tablet Place 1 tablet (0.4 mg total) under the tongue every 5 (five) minutes as needed for chest pain.  . pantoprazole (PROTONIX) 40 MG tablet Take 1 tablet (40 mg total) by mouth daily. Further refills by PCP  . ticagrelor (BRILINTA) 90 MG TABS tablet Take 1 tablet (90 mg total) by mouth 2 (two) times daily.  . [DISCONTINUED] promethazine (PHENERGAN) 12.5 MG tablet Take 1 tablet (12.5 mg total) by mouth every 6 (six) hours as needed for nausea or vomiting. (Patient not taking: Reported on 01/08/2018)   No facility-administered encounter medications on file as of 05/07/2020.    ALLERGIES: Allergies  Allergen Reactions  . Flagyl [Metronidazole] Nausea Only  . Hydrocodone Nausea And Vomiting  and Other (See Comments)    Can take need to take with medication for nausea and vomitting    VACCINATION STATUS: Immunization History  Administered Date(s) Administered  . Moderna Sars-Covid-2 Vaccination 06/25/2019   Diabetes She presents for her initial diabetic visit. She has type 2 diabetes mellitus. Onset time: She was diagnosed at approx age of 55. Her disease course has been fluctuating. There are no hypoglycemic associated symptoms. Associated symptoms include blurred vision, fatigue and visual change. Pertinent negatives for diabetes include  no polydipsia, no polyphagia and no polyuria. There are no hypoglycemic complications. Symptoms are stable. Diabetic complications include heart disease. (Had MI recently) Risk factors for coronary artery disease include diabetes mellitus, dyslipidemia, hypertension, obesity, tobacco exposure, sedentary lifestyle and family history. When asked about current treatments, none were reported. Her weight is fluctuating minimally. She is following a generally unhealthy diet. Meal planning includes avoidance of concentrated sweets. She has not had a previous visit with a dietitian. She rarely participates in exercise. Her overall blood glucose range is >200 mg/dl. (She presents today for her consultation with no meter or logs to review.  She does not have a glucose meter at home, has been using her mothers to check her glucose randomly.  Her most recent A1c was 7% on 04/06/20.  She recently had an MI and has extensive family history of cardiac related illness/mortality.  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  She quit smoking after her MI but recently started back with 1-2 cigarettes per day.  She endorses eating a mostly healthy diet since her heart attack.  She says she is avoiding sugar, but has been using some sugar substitutes (such as sugar free jelly, etc.).  She does not engage in routine physical activity.  She denies any s/s of hypoglycemia.)  An ACE inhibitor/angiotensin II receptor blocker is being taken. She does not see a podiatrist.Eye exam is current.  Hyperlipidemia This is a chronic problem. The current episode started more than 1 year ago. The problem is uncontrolled. Recent lipid tests were reviewed and are variable. Exacerbating diseases include diabetes and obesity. Factors aggravating her hyperlipidemia include beta blockers, fatty foods and smoking. Current antihyperlipidemic treatment includes statins. The current treatment provides mild improvement of lipids. Compliance problems include adherence to diet and adherence to exercise.  Risk factors for coronary artery disease include diabetes mellitus, dyslipidemia, obesity, hypertension, a sedentary lifestyle and family history.  Hypertension This is a chronic problem. The current episode started more than 1 year ago. The problem has been gradually improving since onset. The problem is uncontrolled. Associated symptoms include blurred vision. There are no associated agents to hypertension. Risk factors for coronary artery disease include diabetes mellitus, dyslipidemia, obesity, sedentary lifestyle, smoking/tobacco exposure and family history. Past treatments include beta blockers and angiotensin blockers. The current treatment provides mild improvement. There are no compliance problems.  Hypertensive end-organ damage includes CAD/MI.     Review of systems  Constitutional: + Minimally fluctuating body weight,  current Body mass index is 35.84 kg/m. , + fatigue, no subjective hyperthermia, no subjective hypothermia Eyes: + blurry vision (recently saw ophthalmologist for new Rx glasses), no xerophthalmia ENT: no sore throat, no nodules palpated in throat, no dysphagia/odynophagia, no hoarseness Cardiovascular: no chest pain, no shortness of breath, no palpitations, no leg swelling Respiratory: no cough, no shortness of breath Gastrointestinal: no  nausea/vomiting/diarrhea Musculoskeletal: no muscle/joint aches Skin: no rashes, no hyperemia Neurological: no tremors, no numbness, no tingling, no dizziness Psychiatric: no depression, no anxiety  Objective:     BP 134/81 (BP Location: Left Arm, Patient Position: Sitting)   Pulse (!) 55   Ht 5\' 5"  (1.651 m)   Wt 215 lb 6.4 oz (97.7 kg)   BMI 35.84 kg/m   Wt Readings from Last 3 Encounters:  05/07/20 215 lb 6.4 oz (97.7 kg)  04/20/20 214 lb (97.1 kg)  04/06/20 218 lb 4.1 oz (99 kg)    BP Readings from Last 3 Encounters:  05/07/20 134/81  04/20/20 (!) 142/80  04/08/20 (!) 167/81    Physical Exam- Limited  Constitutional:  Body mass index is 35.84 kg/m. , not in acute distress, normal state of mind Eyes:  EOMI, no exophthalmos Neck: Supple Cardiovascular: RRR, no murmers, rubs, or gallops, no edema Respiratory: Adequate breathing efforts, no crackles, rales, rhonchi, or wheezing Musculoskeletal: no gross deformities, strength intact in all four extremities, no gross restriction of joint movements Skin:  no rashes, no hyperemia Neurological: no tremor with outstretched hands    CMP ( most recent) CMP     Component Value Date/Time   NA 133 (L) 04/20/2020 1234   K 4.0 04/20/2020 1234   CL 102 04/20/2020 1234   CO2 22 04/20/2020 1234   GLUCOSE 225 (H) 04/20/2020 1234   BUN 12 04/20/2020 1234   CREATININE 0.54 04/20/2020 1234   CALCIUM 9.0 04/20/2020 1234   PROT 8.1 04/06/2020 1230   ALBUMIN 4.7 04/06/2020 1230   AST 16 04/06/2020 1230   ALT 32 04/06/2020 1230   ALKPHOS 68 04/06/2020 1230   BILITOT 0.7 04/06/2020 1230   GFRNONAA >60 04/20/2020 1234   GFRAA >60 06/21/2019 2103     Diabetic Labs (most recent): Lab Results  Component Value Date   HGBA1C 7.0 (H) 04/06/2020   HGBA1C 6.2 (H) 10/12/2017   HGBA1C 10.1 (H) 04/21/2017     Lipid Panel ( most recent) Lipid Panel     Component Value Date/Time   CHOL 177 04/08/2020 0158   TRIG 387 (H)  04/08/2020 0158   HDL 21 (L) 04/08/2020 0158   CHOLHDL 8.4 04/08/2020 0158   VLDL 77 (H) 04/08/2020 0158   LDLCALC 79 04/08/2020 0158      Lab Results  Component Value Date   TSH 1.321 04/06/2020   TSH 1.152 06/21/2019      Assessment & Plan:   1) Type 2 diabetes mellitus with hyperglycemia, without long-term current use of insulin ([place])  She presents today for her consultation with no meter or logs to review.  She does not have a glucose meter at home, has been using her mothers to check her glucose randomly.  Her most recent A1c was 7% on 04/06/20.  She recently had an MI and has extensive family history of cardiac related illness/mortality.  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  She quit smoking after her MI but recently started back with 1-2 cigarettes per day.  She endorses eating a mostly healthy diet since her heart attack.  She says she is avoiding sugar, but has been using some sugar substitutes (such as sugar free jelly, etc.).  She does not engage in routine physical activity.  She denies any s/s of hypoglycemia.  - CORONDA ERCOLANI has currently uncontrolled symptomatic type 2 DM since 50 years of age, with most recent A1c of 7 %.   -Recent labs reviewed.  - I had a long discussion with her about the progressive nature of diabetes and the pathology behind its complications. -her diabetes is complicated by CAD (recentMI) and she remains at a high risk for more acute and chronic complications which include CAD, CVA, CKD, retinopathy, and neuropathy. These are all discussed in detail with her.  - I have counseled her on diet  and weight management  by adopting a carbohydrate restricted/protein rich diet. Patient is encouraged to switch to unprocessed or minimally processed complex starch and increased protein intake (animal or plant source), fruits, and vegetables. -  she is advised  to stick to a routine mealtimes to eat 3 meals a day and avoid unnecessary snacks (to  snack only to correct hypoglycemia).   - she acknowledges that there is a room for improvement in her food and drink choices. - Suggestion is made for her to avoid simple carbohydrates  from her diet including Cakes, Sweet Desserts, Ice Cream, Soda (diet and regular), Sweet Tea, Candies, Chips, Cookies, Store Bought Juices, Alcohol in Excess of  1-2 drinks a day, Artificial Sweeteners, Coffee Creamer, and "Sugar-free" Products. This will help patient to have more stable blood glucose profile and potentially avoid unintended weight gain.  - she will be scheduled with Jearld Fenton, RDN, CDE for diabetes education.  - I have approached her with the following individualized plan to manage  her diabetes and patient agrees:   -I discussed and initiated low dose Metformin 500 mg ER to be taken daily with breakfast (she has taken this medication in the past and tolerated it well).  -She is encouraged to start monitoring blood glucose twice daily, before breakfast and before bed for about 1 week after starting her Metformin, then she can reduce to test just once daily before breakfast.  Sample meter from office given to her today.  - Adjustment parameters are given to her for hypo and hyperglycemia in writing. - she is encouraged to call clinic for blood glucose levels less than 70 or above 300 mg /dl.  - she is not a candidate for incretin therapy due to significantly elevated triglycerides and increased risk for pancreatitis.  - Specific targets for  A1c;  LDL, HDL,  and Triglycerides were discussed with the patient.  2) Blood Pressure /Hypertension:  her blood pressure is controlled to target.   she is advised to continue her current medications including Coreg 12.5 mg po twice daily and Losartan 50 mg p.o. daily with breakfast.  3) Lipids/Hyperlipidemia:    Review of her recent lipid panel from 04/08/20 showed controlled LDL at 79 and significantly elevated triglycerides of 387.  she is advised  to continue Lipitor 80 mg daily at bedtime.  Side effects and precautions discussed with her.  4)  Weight/Diet:  her Body mass index is 35.84 kg/m.  -  clearly complicating her diabetes care.   she is a candidate for weight loss. I discussed with her the fact that loss of 5 - 10% of her  current body weight will have the most impact on her diabetes management.  Exercise, and detailed carbohydrates information provided  -  detailed on discharge instructions.  5) Chronic Care/Health Maintenance: -she is on ACEI/ARB and Statin medications and is encouraged to initiate and continue to follow up with Ophthalmology, Dentist, Podiatrist at least yearly or according to recommendations, and advised to [place]. I have recommended yearly flu vaccine and pneumonia vaccine at least every 5 years; moderate intensity exercise for up to 150 minutes weekly; and sleep for at least 7 hours a day.  - she is advised to maintain close follow up with Jani Gravel, MD for primary care needs, as well as her other providers for optimal and coordinated care.   - Time spent in this patient care: 60 min, of which > 50% was spent in  counseling  her about her diabetes and the rest reviewing her blood glucose logs , discussing her hypoglycemia and hyperglycemia episodes, reviewing her current and  previous labs / studies  ( including abstraction from other facilities) and medications  doses and developing a  long term treatment plan based on the latest standards of care/ guidelines; and documenting her care.    Please refer to Patient Instructions for Blood Glucose Monitoring and Insulin/Medications Dosing Guide"  in media tab for additional information. Please  also refer to " Patient Self Inventory" in the Media  tab for reviewed elements of pertinent patient history.  Rosaura Carpenter participated in the discussions, expressed understanding, and voiced agreement with the above plans.  All questions were answered to her  satisfaction. she is encouraged to contact clinic should she have any questions or concerns prior to her return visit.   Follow up plan: - Return in about 3 months (around 08/05/2020) for Diabetes follow up with A1c in office, No previsit labs, Bring glucometer and logs, ABI next visit.  Rayetta Pigg, [facility] [address] [place], [place] [address] Phone: [phone] Fax: [phone]  05/07/2020, 2:49 PM

## 2020-05-17 NOTE — Progress Notes (Signed)
Cardiology Office Note  Date: 05/18/2020   ID: [name], [name] [birth year], MRN [id number]  PCP:  Pearson Grippe, MD  Cardiologist:  Dina Rich, MD Electrophysiologist:  None   Chief Complaint: Hosptial follow up S/P NSTEMI.  History of Present Illness: [name] is a 50 y.o. female with a history of NSTEMI , chest pain,  CAD, DM2, HTN, Hypertriglyceridemia. Hx palpitations (SVT vs sinus tachycardia), current smoker.  She presented to [facility] 04/06/2020 for burning sensation long her throat and with burning / aching sensation along her left arm for the prior few days. Occurred with or without activity. Episodes lasted a few minutes up to 30 minutes.  Troponins  42>50>106>86>161. Echo EF 70% no RWMAs. Cardiac catheterization : Mid Lcx 99%, 70% pRCA with DES. Residual disease distal Cx 50%, 50% RPDA. DAPT therapy with ASA and Brillinta 1 year, then plavix as monotherapy. Lipitor was increased to 80 mg daily. To repeat Lipids in 6 -8 weeks. Consider fenofibrate or Lovasa. She was hypertensive on arrival  To hospital. She was not taking her metoprolol. She was started on Coreg 6.25 mg po bid and Losartan 50 mg daily. To have BMP at follow up.   Last visit she was here for follow-up status post recent cardiac catheterization.  She she stated she noticed an occasional twinge of pain in her chest but attributed this to anxiety.  Stated her symptoms were much better since the cardiac catheterization.  Denied any burning in her throat or burning/aching in her left arm as before PCI.  Denied any DOE, S OB, orthostatic symptoms, CVA or TIA-like symptoms, PND, orthopnea, lower extremity edema, claudication-like symptoms, DVT or PE-like symptoms.  Right radial access site is clean and dry with good pulses.  Here for follow-up today 1 month.  States he has been having some issues with pain in her back and lower legs since starting the atorvastatin.  States she also started Metformin once a day but  her evening blood sugars seem to be higher.  She has recently seen the endocrinologist in [place].  She states she believes she needs to be taking the Metformin twice a day instead of once a day.  States she has also been feeling somewhat tired over the last month or so.  She denies any anginal or exertional symptoms, palpitations or arrhythmias, orthostatic symptoms, CVA or TIA-like symptoms, PND, orthopnea, bleeding.  Denies any claudication-like symptoms but does state her legs are hurting.  She is concerned she may be on too much cholesterol medication.  Denies any DVT or PE-like symptoms, or lower extremity edema.  Past Medical History:  Diagnosis Date  . Arthritis   . Carpal tunnel syndrome, bilateral   . Coronary artery disease   . Diabetes mellitus without complication (HCC)    no meds; diet controlled  . Diverticulitis   . Hypertension   . Hypertriglyceridemia   . PONV (postoperative nausea and vomiting)   . Sciatica     Past Surgical History:  Procedure Laterality Date  . ablasion of uterus    . CARPAL TUNNEL RELEASE  11/10/2010   Procedure: CARPAL TUNNEL RELEASE;  Surgeon: Fuller Canada, MD;  Location: [facility];  Service: Orthopedics;  Laterality: Right;  . CHOLECYSTECTOMY     [facility]  . COLON RESECTION N/A 04/08/2015   Procedure: LAPAROSCOPIC HAND ASSISTED PARTIAL COLECTOMY  CONVERTED TO OPEN AT 8889;  Surgeon: Franky Macho, MD;  Location: [facility];  Service: General;  Laterality: N/A;  .  COLONOSCOPY N/A 03/28/2014   Procedure: COLONOSCOPY;  Surgeon: Rogene Houston, MD;  Location: [facility];  Service: Endoscopy;  Laterality: N/A;  730  . CORONARY STENT INTERVENTION  04/07/2020  . CORONARY STENT INTERVENTION N/A 04/07/2020   Procedure: CORONARY STENT INTERVENTION;  Surgeon: Jettie Booze, MD;  Location: [facility];  Service: Cardiovascular;  Laterality: N/A;  . KNEE ARTHROSCOPY WITH DRILLING/MICROFRACTURE Left 10/20/2016   Procedure: LEFT KNEE ARTHROSCOPY WITH  MICROFRACTURE;  Surgeon: Carole Civil, MD;  Location: [facility];  Service: Orthopedics;  Laterality: Left;  . KNEE ARTHROSCOPY WITH DRILLING/MICROFRACTURE Left 04/27/2017   Procedure: KNEE ARTHROSCOPY WITH DRILLING/MICROFRACTURE;  Surgeon: Carole Civil, MD;  Location: [facility];  Service: Orthopedics;  Laterality: Left;  . KNEE ARTHROSCOPY WITH OSTEOCHONDRAL DEFECT REPAIR Left 04/27/2017   Procedure: KNEE ARTHROSCOPY WITH OSTEOCHONDRAL DEFECT REPAIR Autograft;  Surgeon: Carole Civil, MD;  Location: [facility];  Service: Orthopedics;  Laterality: Left;  . KNEE SURGERY    . LEFT HEART CATH AND CORONARY ANGIOGRAPHY N/A 04/07/2020   Procedure: LEFT HEART CATH AND CORONARY ANGIOGRAPHY;  Surgeon: Jettie Booze, MD;  Location: [facility];  Service: Cardiovascular;  Laterality: N/A;  . PARTIAL COLECTOMY N/A 04/08/2015   Procedure: PARTIAL COLECTOMY;  Surgeon: Aviva Signs, MD;  Location: [facility];  Service: General;  Laterality: N/A;  . right knee arthroscopy    . TOTAL KNEE ARTHROPLASTY Left 10/17/2017   Procedure: TOTAL KNEE ARTHROPLASTY;  Surgeon: Carole Civil, MD;  Location: [facility];  Service: Orthopedics;  Laterality: Left;  [place]   . TUBAL LIGATION      Current Outpatient Medications  Medication Sig Dispense Refill  . acetaminophen (TYLENOL) 500 MG tablet Take 500 mg by mouth every 6 (six) hours as needed for mild pain.    Marland Kitchen aspirin EC 81 MG tablet Take 1 tablet (81 mg total) by mouth daily. Swallow whole. 30 tablet 11  . atorvastatin (LIPITOR) 80 MG tablet Take 1 tablet (80 mg total) by mouth daily at 6 PM. 30 tablet 6  . carvedilol (COREG) 12.5 MG tablet Take 1 tablet (12.5 mg total) by mouth 2 (two) times daily. 180 tablet 1  . glucose blood (ONETOUCH VERIO) test strip Use as instructed to monitor blood glucose once daily. 100 each 12  . Lancets (ONETOUCH ULTRASOFT) lancets Use as instructed to monitor glucose once daily 100 each 12  . losartan (COZAAR) 50 MG tablet  Take 1 tablet (50 mg total) by mouth daily. 90 tablet 1  . metFORMIN (GLUCOPHAGE XR) 500 MG 24 hr tablet Take 1 tablet (500 mg total) by mouth daily with breakfast. 30 tablet 3  . nitroGLYCERIN (NITROSTAT) 0.4 MG SL tablet Place 1 tablet (0.4 mg total) under the tongue every 5 (five) minutes as needed for chest pain. 25 tablet 12  . ticagrelor (BRILINTA) 90 MG TABS tablet Take 1 tablet (90 mg total) by mouth 2 (two) times daily. 60 tablet 11   No current facility-administered medications for this visit.   Allergies:  Flagyl [metronidazole] and Hydrocodone   Social History: The patient  reports that she quit smoking about 6 weeks ago. Her smoking use included cigarettes. She has a 26.00 pack-year smoking history. She has never used smokeless tobacco. She reports that she does not drink alcohol and does not use drugs.   Family History: The patient's family history includes Arthritis in an other family member; Asthma in an other family member; Cancer in an other family  member; Diabetes in an other family member; Diabetes type I in her mother; Diabetes type II in her brother, brother, and father; Hyperlipidemia in her father and mother; Hypertension in her father.   ROS:  Please see the history of present illness. Otherwise, complete review of systems is positive for none.  All other systems are reviewed and negative.   Physical Exam: VS:  BP 114/66   Pulse 76   Resp 16   Ht 5\' 5"  (1.651 m)   Wt 210 lb (95.3 kg)   SpO2 99%   BMI 34.95 kg/m , BMI Body mass index is 34.95 kg/m.  Wt Readings from Last 3 Encounters:  05/18/20 210 lb (95.3 kg)  05/07/20 215 lb 6.4 oz (97.7 kg)  04/20/20 214 lb (97.1 kg)    General: Obese patient appears comfortable at rest. Neck: Supple, no elevated JVP or carotid bruits, no thyromegaly. Lungs: Clear to auscultation, nonlabored breathing at rest. Cardiac: Regular rate and rhythm, no S3 or significant systolic murmur, no pericardial rub. Extremities: No  pitting edema, distal pulses 2+. Skin: Warm and dry.  Right radial access site clean and dry without redness or swelling 2+ pulses Musculoskeletal: No kyphosis. Neuropsychiatric: Alert and oriented x3, affect grossly appropriate.  ECG:  EKG 04/07/2020 sinus bradycardia rate of 53.  Recent Labwork: 04/06/2020: ALT 32; AST 16; TSH 1.321 04/08/2020: Hemoglobin 13.6; Platelets 306 04/20/2020: BUN 12; Creatinine, Ser 0.54; Potassium 4.0; Sodium 133     Component Value Date/Time   CHOL 177 04/08/2020 0158   TRIG 387 (H) 04/08/2020 0158   HDL 21 (L) 04/08/2020 0158   CHOLHDL 8.4 04/08/2020 0158   VLDL 77 (H) 04/08/2020 0158   LDLCALC 79 04/08/2020 0158    Other Studies Reviewed Today:  04/07/2020  CORONARY STENT INTERVENTION  LEFT HEART CATH AND CORONARY ANGIOGRAPHY    Conclusion  Dist Cx lesion is 50% stenosed.  Mid Cx lesion is 99% stenosed. This was the culprit lesion for presentation.  A drug-eluting stent was successfully placed using a STENT RESOLUTE ONYX 3.0X22, postdilated to 3.75 mm, and optimized by IVUS  Post intervention, there is a 0% residual stenosis.  Prox RCA lesion is 70% stenosed. Cross sectional area 3.9 mm2 by IVUS.  A drug-eluting stent was successfully placed using a STENT RESOLUTE ONYX 3.5X12, postdilated to > 4 mm and optimized by IVUS.  RPDA lesion is 50% stenosed.  Post intervention, there is a 0% residual stenosis.  The left ventricular systolic function is normal.  LV end diastolic pressure is mildly elevated.  The left ventricular ejection fraction is 55-65% by visual estimate.  There is no aortic valve stenosis.  Consider clopidogrel monotherapy after 12 months of DAPT given her diffuse distal vessel disease.   Continue aggressive secondary prevention.      Intervention     Echo 04/06/20  1. Left ventricular ejection fraction, by estimation, is 65 to 70%. The  left ventricle has normal function. The left ventricle  has no regional  wall motion abnormalities. There is mild left ventricular hypertrophy.  Left ventricular diastolic parameters  were normal.  2. Right ventricular systolic function is normal. The right ventricular  size is normal.  3. Left atrial size was mildly dilated.  4. The mitral valve is normal in structure. No evidence of mitral valve  regurgitation. No evidence of mitral stenosis.  5. The aortic valve is tricuspid. Aortic valve regurgitation is not  visualized. No aortic stenosis is present.  6. The inferior vena cava  is normal in size with greater than 50%  respiratory variability, suggesting right atrial pressure of 3 mmHg.    Assessment and Plan:   1. Non-ST elevation (NSTEMI) myocardial infarction [facility]) Recent NSTEMI with PCI/DES to Mid circumflex and proximal RCA.  Had residual disease in distal circumflex of 50% and RPDA lesion 50%.  Continue aspirin 81 mg daily.  Continue Brilinta 90 mg p.o. twice daily.  Continue sublingual nitroglycerin as needed.  Continue Coreg to 12.5 mg p.o. twice daily.     2. Mixed hyperlipidemia Continue atorvastatin 80 mg p.o. daily.  Patient states she is having leg pain and back pain.  She is concerned this may be due to recently starting cholesterol medication.  Please get FLP and LFTs.  3. Essential hypertension Blood pressure 114/66 today.  Continue carvedilol to 12.5 mg p.o. twice daily.  Continue losartan 50 mg p.o. daily.  4. Type 2 diabetes mellitus without complication, without long-term current use of insulin ([place]) Patient has diabetes and not on any antihyperglycemics medications.  Patient states she seen Dr. Dorien Chihuahua in [place] and was recently started on Metformin once a day but it appears not to be controlling her sugars in the evening.  She plans to call the endocrinology office and get the Metformin scheduled for twice daily.  5.  Fatigue Patient states she has been feeling increased fatigue over the last few weeks in  addition to muscle-like pain in both her lower extremities.  Please get a CBC, TSH, T3, T4, basic metabolic panel, magnesium, vitamin D, vitamin B12.   Medication Adjustments/Labs and Tests Ordered: Current medicines are reviewed at length with the patient today.  Concerns regarding medicines are outlined above.   Disposition: Follow-up with Dr. Harl Bowie or [facility] 6 months Signed, Levell July, NP 05/18/2020 11:42 AM    [facility] [place], [place], [place] [address] Phone: [phone]; Fax: [phone]

## 2020-05-18 ENCOUNTER — Ambulatory Visit (INDEPENDENT_AMBULATORY_CARE_PROVIDER_SITE_OTHER): Payer: 59 | Admitting: Family Medicine

## 2020-05-18 ENCOUNTER — Encounter: Payer: Self-pay | Admitting: Family Medicine

## 2020-05-18 ENCOUNTER — Other Ambulatory Visit: Payer: Self-pay

## 2020-05-18 VITALS — BP 114/66 | HR 76 | Resp 16 | Ht 65.0 in | Wt 210.0 lb

## 2020-05-18 DIAGNOSIS — M791 Myalgia, unspecified site: Secondary | ICD-10-CM | POA: Diagnosis not present

## 2020-05-18 DIAGNOSIS — E782 Mixed hyperlipidemia: Secondary | ICD-10-CM

## 2020-05-18 DIAGNOSIS — R5383 Other fatigue: Secondary | ICD-10-CM

## 2020-05-18 DIAGNOSIS — I10 Essential (primary) hypertension: Secondary | ICD-10-CM

## 2020-05-18 DIAGNOSIS — E119 Type 2 diabetes mellitus without complications: Secondary | ICD-10-CM

## 2020-05-18 NOTE — Patient Instructions (Addendum)
Medication Instructions:  Continue all current medications.  Labwork:  FLP, LFT, BMET, Mg, TSH, T4, T3, CBC, Vit D, Vit B12 - orders given for [facility] today.   Office will contact with results via phone or letter.    Testing/Procedures: none  Follow-Up: 6 months   Any Other Special Instructions Will Be Listed Below (If Applicable).  If you need a refill on your cardiac medications before your next appointment, please call your pharmacy.

## 2020-05-19 ENCOUNTER — Telehealth: Payer: Self-pay

## 2020-05-19 ENCOUNTER — Other Ambulatory Visit (HOSPITAL_COMMUNITY)
Admission: RE | Admit: 2020-05-19 | Discharge: 2020-05-19 | Disposition: A | Discharge status: Home / Self Care | Payer: 59 | Source: Ambulatory Visit | Attending: Family Medicine | Admitting: Family Medicine

## 2020-05-19 ENCOUNTER — Other Ambulatory Visit: Payer: Self-pay

## 2020-05-19 DIAGNOSIS — R5383 Other fatigue: Secondary | ICD-10-CM | POA: Insufficient documentation

## 2020-05-19 DIAGNOSIS — M791 Myalgia, unspecified site: Secondary | ICD-10-CM | POA: Diagnosis present

## 2020-05-19 DIAGNOSIS — E782 Mixed hyperlipidemia: Secondary | ICD-10-CM | POA: Diagnosis present

## 2020-05-19 DIAGNOSIS — I10 Essential (primary) hypertension: Secondary | ICD-10-CM | POA: Diagnosis present

## 2020-05-19 LAB — BASIC METABOLIC PANEL
Anion gap: 8 (ref 5–15)
BUN: 14 mg/dL (ref 6–20)
CO2: 23 mmol/L (ref 22–32)
Calcium: 9.2 mg/dL (ref 8.9–10.3)
Chloride: 105 mmol/L (ref 98–111)
Creatinine, Ser: 0.58 mg/dL (ref 0.44–1.00)
GFR, Estimated: >60 mL/min (ref >60)
Glucose, Bld: 159 mg/dL — ABNORMAL HIGH (ref 70–99)
Potassium: 3.9 mmol/L (ref 3.5–5.1)
Sodium: 136 mmol/L (ref 135–145)

## 2020-05-19 LAB — CBC
HCT: 43.5 % (ref 36.0–46.0)
Hemoglobin: 14.1 g/dL (ref 12.0–15.0)
MCH: 30 pg (ref 26.0–34.0)
MCHC: 32.4 g/dL (ref 30.0–36.0)
MCV: 92.6 fL (ref 80.0–100.0)
Platelets: 365 10*3/uL (ref 150–400)
RBC: 4.7 MIL/uL (ref 3.87–5.11)
RDW: 13.3 % (ref 11.5–15.5)
WBC: 11.8 10*3/uL — ABNORMAL HIGH (ref 4.0–10.5)
nRBC: 0 % (ref 0.0–0.2)

## 2020-05-19 LAB — HEPATIC FUNCTION PANEL
ALT: 34 U/L (ref 0–44)
AST: 20 U/L (ref 15–41)
Albumin: 4.2 g/dL (ref 3.5–5.0)
Alkaline Phosphatase: 76 U/L (ref 38–126)
Bilirubin, Direct: 0.1 mg/dL (ref 0.0–0.2)
Indirect Bilirubin: 0.5 mg/dL (ref 0.3–0.9)
Total Bilirubin: 0.6 mg/dL (ref 0.3–1.2)
Total Protein: 7.3 g/dL (ref 6.5–8.1)

## 2020-05-19 LAB — LIPID PANEL
Cholesterol: 89 mg/dL (ref 0–200)
HDL: 23 mg/dL — ABNORMAL LOW (ref >40)
LDL Cholesterol: 41 mg/dL (ref 0–99)
Total CHOL/HDL Ratio: 3.9 RATIO
Triglycerides: 125 mg/dL (ref <150)
VLDL: 25 mg/dL (ref 0–40)

## 2020-05-19 LAB — VITAMIN B12: Vitamin B-12: 778 pg/mL (ref 180–914)

## 2020-05-19 LAB — VITAMIN D 25 HYDROXY (VIT D DEFICIENCY, FRACTURES): Vit D, 25-Hydroxy: 25.99 ng/mL — ABNORMAL LOW (ref 30–100)

## 2020-05-19 LAB — T4, FREE: Free T4: 0.92 ng/dL (ref 0.61–1.12)

## 2020-05-19 LAB — MAGNESIUM: Magnesium: 1.9 mg/dL (ref 1.7–2.4)

## 2020-05-19 LAB — TSH: TSH: 0.905 u[IU]/mL (ref 0.350–4.500)

## 2020-05-19 NOTE — Telephone Encounter (Signed)
Discussed with pt, understanding voiced. 

## 2020-05-19 NOTE — Telephone Encounter (Signed)
Pt taking metformin ER 500mg  daily. Her BG readings for Saturday: 170 before breakfast, 169 before bed Sunday: 137 before breakfast, 147 before bed Monday: 162 before breakfast, 125 before bed Tuesday: 161.

## 2020-05-19 NOTE — Telephone Encounter (Signed)
Pt left a VM that she would like you to call her regarding her metformin. She said she would like it increased because it is not keeping her sugar down.

## 2020-05-19 NOTE — Telephone Encounter (Signed)
She can increase her Metformin to 1000 mg ER daily with breakfast.  Goal fasting blood glucose readings would be between 90-110.

## 2020-05-20 LAB — T3, FREE: T3, Free: 3.6 pg/mL (ref 2.0–4.4)

## 2020-05-21 NOTE — Progress Notes (Signed)
Yes she can decrease the Lipitor to 40 mg daily.  Tell her the best way to increase the good cholesterol is with exercise.  Other things you can do are modifying her diet to include less saturated fat and simple carbohydrates.  But there is really no substitute for exercise to help with increasing the HDL.  Thank you

## 2020-05-26 ENCOUNTER — Telehealth: Payer: Self-pay | Admitting: Family Medicine

## 2020-05-26 NOTE — Telephone Encounter (Signed)
Laurine Blazer, LPN  0/98/1191 4:78 PM EST Back to Top     Patient notified. She questions if she could cut the Lipitor dose in half to 40mg  daily since labs numbers were good. Would like to see if that would help with the joint pains she has been having. If not okay for her to try this, she will do what provider suggests. Also, stated that the endocrinologist just increased her Metformin ER to 1,000mg  daily yesterday. Would like to know what she can do to help the good cholesterol (HDL) to go up.

## 2020-05-26 NOTE — Telephone Encounter (Signed)
Pt voiced understanding and will decrease atorvastatin 40 mg daily - updated medication list and pt will call us back if muscle aches do not improve

## 2020-05-26 NOTE — Telephone Encounter (Signed)
NEW MESSAGE     PATIENT HAS BEEN WAITING SINCE LAST WEEK FOR A CALL BACK REGARDING REACTION TO atorvastatin (LIPITOR) 80 MG tablet  Pt c/o medication issue:  1. Name of Medication: atorvastatin (LIPITOR) 80 MG tablet  2. How are you currently taking this medication (dosage and times per day)?  1 TABLET DAILY   3. Are you having a reaction (difficulty breathing--STAT)? NO  4. What is your medication issue? HAVING A LOT OF PAIN IN HER LEGS

## 2020-05-26 NOTE — Telephone Encounter (Signed)
Cutting the Lipitor dose in half sounds reasonable. I agree. Thank you

## 2020-06-16 ENCOUNTER — Other Ambulatory Visit: Payer: Self-pay

## 2020-06-16 DIAGNOSIS — E1165 Type 2 diabetes mellitus with hyperglycemia: Secondary | ICD-10-CM

## 2020-06-16 MED ORDER — METFORMIN HCL ER 500 MG PO TB24
ORAL_TABLET | ORAL | 0 refills | Status: DC
Start: 2020-06-16 — End: 2020-08-05

## 2020-06-30 ENCOUNTER — Telehealth: Payer: Self-pay | Admitting: Family Medicine

## 2020-06-30 MED ORDER — CLOPIDOGREL BISULFATE 75 MG PO TABS: 75.0000 mg | ORAL_TABLET | Freq: Every day | ORAL | 3 refills | Status: DC

## 2020-06-30 NOTE — Telephone Encounter (Signed)
Patient states she can not afford over $400 for Brilinta. She would like to know can she try a different medication. Please call back.

## 2020-06-30 NOTE — Telephone Encounter (Signed)
I emphazied the importance of not missing a dose of either the Brilinta or Plavix and patient verbalized understanding. Medication was sent to the pharmacy.

## 2020-06-30 NOTE — Telephone Encounter (Signed)
Called patient back and she states that she cannot afford $400.00 a month for Brilinta. Unfortunately she also makes too much money to qualify for the Patient Assistance Program. She is wanting to know if there is a medication that she can switch to. Please advise.

## 2020-06-30 NOTE — Telephone Encounter (Signed)
Can change to plavix. Emphasize cannot stop brillinta until she has plavix in hand, should not be a day where she does not take either brillinta or plavix. On day one take plavix 300mg , then after that 75mg  daily   Zandra Abts MD

## 2020-08-04 NOTE — Patient Instructions (Signed)

## 2020-08-05 ENCOUNTER — Ambulatory Visit (INDEPENDENT_AMBULATORY_CARE_PROVIDER_SITE_OTHER): Payer: 59 | Admitting: Nurse Practitioner

## 2020-08-05 ENCOUNTER — Other Ambulatory Visit: Payer: Self-pay

## 2020-08-05 ENCOUNTER — Encounter: Payer: Self-pay | Admitting: Nurse Practitioner

## 2020-08-05 VITALS — BP 137/77 | HR 67 | Ht 65.0 in | Wt 201.0 lb

## 2020-08-05 DIAGNOSIS — E1165 Type 2 diabetes mellitus with hyperglycemia: Secondary | ICD-10-CM | POA: Diagnosis not present

## 2020-08-05 DIAGNOSIS — E781 Pure hyperglyceridemia: Secondary | ICD-10-CM | POA: Diagnosis not present

## 2020-08-05 DIAGNOSIS — I10 Essential (primary) hypertension: Secondary | ICD-10-CM

## 2020-08-05 LAB — POCT UA - MICROALBUMIN: Microalbumin Ur, POC: 10 mg/L

## 2020-08-05 LAB — POCT GLYCOSYLATED HEMOGLOBIN (HGB A1C): HbA1c, POC (controlled diabetic range): 6.3 % (ref 0.0–7.0)

## 2020-08-05 MED ORDER — GLIPIZIDE ER 5 MG PO TB24: 5.0000 mg | ORAL_TABLET | Freq: Every day | ORAL | 3 refills | Status: DC

## 2020-08-05 MED ORDER — METFORMIN HCL ER 500 MG PO TB24: ORAL_TABLET | ORAL | 0 refills | Status: DC

## 2020-08-05 NOTE — Progress Notes (Signed)
Endocrinology Follow Up Note       08/05/2020, 4:20 PM   Subjective:    Patient ID: [name], female    DOB: 04/20/1971.  [name] is being seen in follow up after being seen in consultation for management of currently uncontrolled symptomatic diabetes requested by  Jani Gravel, MD.   Past Medical History:  Diagnosis Date  . Arthritis   . Carpal tunnel syndrome, bilateral   . Coronary artery disease   . Diabetes mellitus without complication ([place])    no meds; diet controlled  . Diverticulitis   . Hypertension   . Hypertriglyceridemia   . PONV (postoperative nausea and vomiting)   . Sciatica     Past Surgical History:  Procedure Laterality Date  . ablasion of uterus    . CARPAL TUNNEL RELEASE  11/10/2010   Procedure: CARPAL TUNNEL RELEASE;  Surgeon: Arther Abbott, MD;  Location: [facility];  Service: Orthopedics;  Laterality: Right;  . CHOLECYSTECTOMY     [facility]  . COLON RESECTION N/A 04/08/2015   Procedure: LAPAROSCOPIC HAND ASSISTED PARTIAL COLECTOMY  CONVERTED TO OPEN AT 2458;  Surgeon: Aviva Signs, MD;  Location: [facility];  Service: General;  Laterality: N/A;  . COLONOSCOPY N/A 03/28/2014   Procedure: COLONOSCOPY;  Surgeon: Rogene Houston, MD;  Location: [facility];  Service: Endoscopy;  Laterality: N/A;  730  . CORONARY STENT INTERVENTION  04/07/2020  . CORONARY STENT INTERVENTION N/A 04/07/2020   Procedure: CORONARY STENT INTERVENTION;  Surgeon: Jettie Booze, MD;  Location: [facility];  Service: Cardiovascular;  Laterality: N/A;  . KNEE ARTHROSCOPY WITH DRILLING/MICROFRACTURE Left 10/20/2016   Procedure: LEFT KNEE ARTHROSCOPY WITH MICROFRACTURE;  Surgeon: Carole Civil, MD;  Location: [facility];  Service: Orthopedics;  Laterality: Left;  . KNEE ARTHROSCOPY WITH DRILLING/MICROFRACTURE Left 04/27/2017   Procedure: KNEE ARTHROSCOPY WITH DRILLING/MICROFRACTURE;  Surgeon: Carole Civil, MD;  Location: [facility];  Service: Orthopedics;  Laterality: Left;  . KNEE ARTHROSCOPY WITH OSTEOCHONDRAL DEFECT REPAIR Left 04/27/2017   Procedure: KNEE ARTHROSCOPY WITH OSTEOCHONDRAL DEFECT REPAIR Autograft;  Surgeon: Carole Civil, MD;  Location: [facility];  Service: Orthopedics;  Laterality: Left;  . KNEE SURGERY    . LEFT HEART CATH AND CORONARY ANGIOGRAPHY N/A 04/07/2020   Procedure: LEFT HEART CATH AND CORONARY ANGIOGRAPHY;  Surgeon: Jettie Booze, MD;  Location: [facility];  Service: Cardiovascular;  Laterality: N/A;  . PARTIAL COLECTOMY N/A 04/08/2015   Procedure: PARTIAL COLECTOMY;  Surgeon: Aviva Signs, MD;  Location: [facility];  Service: General;  Laterality: N/A;  . right knee arthroscopy    . TOTAL KNEE ARTHROPLASTY Left 10/17/2017   Procedure: TOTAL KNEE ARTHROPLASTY;  Surgeon: Carole Civil, MD;  Location: [facility];  Service: Orthopedics;  Laterality: Left;  [place]   . TUBAL LIGATION      Social History   Socioeconomic History  . Marital status: Divorced    Spouse name: Not on file  . Number of children: Not on file  . Years of education: college  . Highest education level: Not on file  Occupational History  . Occupation: Optician, dispensing: [employer]  Tobacco Use  . Smoking status: Current Every Day Smoker    Packs/day: 1.00    Years: 26.00    Pack years: 26.00    Types: Cigarettes    Last attempt to quit: 04/05/2020    Years since quitting: 0.3  . Smokeless tobacco: Never Used  Vaping Use  . Vaping Use: Never used  Substance and Sexual Activity  . Alcohol use: No    Alcohol/week: 0.0 standard drinks  . Drug use: No  . Sexual activity: Yes    Birth control/protection: Surgical  Other Topics Concern  . Not on file  Social History Narrative  . Not on file   Social Determinants of Health   Financial Resource Strain: Not on file  Food Insecurity: Not on file  Transportation Needs: Not on file  Physical Activity: Not on file   Stress: Not on file  Social Connections: Not on file    Family History  Problem Relation Age of Onset  . Diabetes type I Mother   . Hyperlipidemia Mother   . Diabetes type II Father   . Hypertension Father   . Hyperlipidemia Father   . Diabetes type II Brother   . Diabetes type II Brother   . Cancer Other   . Diabetes Other   . Arthritis Other   . Asthma Other   . Anesthesia problems Neg Hx     Outpatient Encounter Medications as of 08/05/2020  Medication Sig  . acetaminophen (TYLENOL) 500 MG tablet Take 500 mg by mouth every 6 (six) hours as needed for mild pain.  Marland Kitchen aspirin EC 81 MG tablet Take 1 tablet (81 mg total) by mouth daily. Swallow whole.  Marland Kitchen atorvastatin (LIPITOR) 80 MG tablet Take 1 tablet (80 mg total) by mouth daily at 6 PM. (Patient taking differently: Take 40 mg by mouth daily at 6 PM.)  . carvedilol (COREG) 12.5 MG tablet Take 1 tablet (12.5 mg total) by mouth 2 (two) times daily.  . clopidogrel (PLAVIX) 75 MG tablet Take 1 tablet (75 mg total) by mouth daily. Take 4 tablets on day one then 1 tablet daily thereafter.  Marland Kitchen glipiZIDE (GLUCOTROL XL) 5 MG 24 hr tablet Take 1 tablet (5 mg total) by mouth daily with breakfast.  . glucose blood (ONETOUCH VERIO) test strip Use as instructed to monitor blood glucose once daily.  . Lancets (ONETOUCH DELICA PLUS [id number]) Lead by Does not apply route.  Marland Kitchen losartan (COZAAR) 50 MG tablet Take 1 tablet (50 mg total) by mouth daily.  . nitroGLYCERIN (NITROSTAT) 0.4 MG SL tablet PLACE 1 TABLET (0.4 MG TOTAL) UNDER THE TONGUE EVERY 5 (FIVE) MINUTES AS NEEDED FOR CHEST PAIN.  . [DISCONTINUED] aspirin 81 MG EC tablet TAKE 1 TABLET (81 MG TOTAL) BY MOUTH DAILY. SWALLOW WHOLE. (Patient taking differently: Take by mouth 5 (five) times daily. SWALLOW WHOLE.)  . [DISCONTINUED] Lancets (ONETOUCH ULTRASOFT) lancets Use as instructed to monitor glucose once daily  . [DISCONTINUED] metFORMIN (GLUCOPHAGE XR) 500 MG 24 hr tablet Take 2 tablets  daily with breakfast (Patient taking differently: Take 2 tablets daily with breakfast, patient taking 2 tabs BID)  . metFORMIN (GLUCOPHAGE XR) 500 MG 24 hr tablet Take 2 tablets daily with breakfast  . [DISCONTINUED] pantoprazole (PROTONIX) 40 MG tablet Take 1 tablet (40 mg total) by mouth daily. Further refills by PCP (Patient not taking: Reported on 05/18/2020)  . [DISCONTINUED] promethazine (PHENERGAN) 12.5 MG tablet Take 1 tablet (12.5 mg total) by mouth every 6 (six) hours as  needed for nausea or vomiting. (Patient not taking: Reported on 01/08/2018)   No facility-administered encounter medications on file as of 08/05/2020.    ALLERGIES: Allergies  Allergen Reactions  . Flagyl [Metronidazole] Nausea Only  . Hydrocodone Nausea And Vomiting and Other (See Comments)    Can take need to take with medication for nausea and vomitting    VACCINATION STATUS: Immunization History  Administered Date(s) Administered  . Moderna Sars-Covid-2 Vaccination 06/25/2019   Diabetes She presents for her follow-up diabetic visit. She has type 2 diabetes mellitus. Onset time: She was diagnosed at approx age of 66. Her disease course has been improving. There are no hypoglycemic associated symptoms. Pertinent negatives for diabetes include no blurred vision, no fatigue, no polydipsia, no polyphagia, no polyuria and no visual change. There are no hypoglycemic complications. Symptoms are improving. Diabetic complications include heart disease. (Had MI recently) Risk factors for coronary artery disease include diabetes mellitus, dyslipidemia, hypertension, obesity, tobacco exposure, sedentary lifestyle and family history. Current diabetic treatment includes oral agent (monotherapy). She is compliant with treatment most of the time. Her weight is fluctuating minimally. She is following a generally unhealthy diet. Meal planning includes avoidance of concentrated sweets. She has not had a previous visit with a dietitian.  She rarely participates in exercise. Her home blood glucose trend is decreasing steadily. Her breakfast blood glucose range is generally 130-140 mg/dl. Her bedtime blood glucose range is generally 90-110 mg/dl. (She presents today with her logs, no meter, showing at goal fasting and postprandial glycemic profile.  Her POCT A1c today is 6.3%, improving from previous visit of 7%.  She has been taking her ER Metformin twice daily (more than prescribed).  She is continuing to work on diet and exercise and has lost weight.  There are no episodes of hypoglycemia.) An ACE inhibitor/angiotensin II receptor blocker is being taken. She does not see a podiatrist.Eye exam is current.  Hyperlipidemia This is a chronic problem. The current episode started more than 1 year ago. The problem is controlled. Recent lipid tests were reviewed and are variable. Exacerbating diseases include diabetes and obesity. Factors aggravating her hyperlipidemia include beta blockers. Current antihyperlipidemic treatment includes statins. The current treatment provides moderate improvement of lipids. Compliance problems include adherence to diet and adherence to exercise.  Risk factors for coronary artery disease include diabetes mellitus, dyslipidemia, obesity, hypertension, a sedentary lifestyle and family history.  Hypertension This is a chronic problem. The current episode started more than 1 year ago. The problem has been gradually improving since onset. The problem is controlled. Pertinent negatives include no blurred vision. There are no associated agents to hypertension. Risk factors for coronary artery disease include diabetes mellitus, dyslipidemia, obesity, sedentary lifestyle, smoking/tobacco exposure and family history. Past treatments include beta blockers and angiotensin blockers. The current treatment provides mild improvement. There are no compliance problems.  Hypertensive end-organ damage includes CAD/MI.     Review of  systems  Constitutional: + steadily decreasing body weight,  current Body mass index is 33.45 kg/m. , no fatigue, no subjective hyperthermia, no subjective hypothermia Eyes: no blurry vision, no xerophthalmia ENT: no sore throat, no nodules palpated in throat, no dysphagia/odynophagia, no hoarseness Cardiovascular: no chest pain, no shortness of breath, no palpitations, no leg swelling Respiratory: no cough, no shortness of breath Gastrointestinal: no nausea/vomiting/diarrhea Musculoskeletal: no muscle/joint aches Skin: no rashes, no hyperemia Neurological: no tremors, no numbness, no tingling, no dizziness Psychiatric: no depression, no anxiety  Objective:     BP 137/77 (  BP Location: Left Arm)   Pulse 67   Ht 5\' 5"  (1.651 m)   Wt 201 lb (91.2 kg)   BMI 33.45 kg/m   Wt Readings from Last 3 Encounters:  08/05/20 201 lb (91.2 kg)  05/18/20 210 lb (95.3 kg)  05/07/20 215 lb 6.4 oz (97.7 kg)    BP Readings from Last 3 Encounters:  08/05/20 137/77  05/18/20 114/66  05/07/20 134/81     Physical Exam- Limited  Constitutional:  Body mass index is 33.45 kg/m. , not in acute distress, normal state of mind Eyes:  EOMI, no exophthalmos Neck: Supple Cardiovascular: RRR, no murmers, rubs, or gallops, no edema Respiratory: Adequate breathing efforts, no crackles, rales, rhonchi, or wheezing Musculoskeletal: no gross deformities, strength intact in all four extremities, no gross restriction of joint movements Skin:  no rashes, no hyperemia Neurological: no tremor with outstretched hands    POCT ABI Results 08/05/20   Right ABI:  1.23      Left ABI:  1.26  Right leg systolic / diastolic: 903/00 mmHg Left leg systolic / diastolic: 923/30 mmHg  Arm systolic / diastolic: 076/22 mmHG  Detailed report will be scanned into patient chart.      CMP ( most recent) CMP     Component Value Date/Time   NA 136 05/19/2020 0903   K 3.9 05/19/2020 0903   CL 105 05/19/2020 0903    CO2 23 05/19/2020 0903   GLUCOSE 159 (H) 05/19/2020 0903   BUN 14 05/19/2020 0903   CREATININE 0.58 05/19/2020 0903   CALCIUM 9.2 05/19/2020 0903   PROT 7.3 05/19/2020 0903   ALBUMIN 4.2 05/19/2020 0903   AST 20 05/19/2020 0903   ALT 34 05/19/2020 0903   ALKPHOS 76 05/19/2020 0903   BILITOT 0.6 05/19/2020 0903   GFRNONAA >60 05/19/2020 0903   GFRAA >60 06/21/2019 2103     Diabetic Labs (most recent): Lab Results  Component Value Date   HGBA1C 6.3 08/05/2020   HGBA1C 7.0 (H) 04/06/2020   HGBA1C 6.2 (H) 10/12/2017     Lipid Panel ( most recent) Lipid Panel     Component Value Date/Time   CHOL 89 05/19/2020 0903   TRIG 125 05/19/2020 0903   HDL 23 (L) 05/19/2020 0903   CHOLHDL 3.9 05/19/2020 0903   VLDL 25 05/19/2020 0903   LDLCALC 41 05/19/2020 0903      Lab Results  Component Value Date   TSH 0.905 05/19/2020   TSH 1.321 04/06/2020   TSH 1.152 06/21/2019   FREET4 0.92 05/19/2020      Assessment & Plan:   1) Type 2 diabetes mellitus with hyperglycemia, without long-term current use of insulin ([place])  She presents today with her logs, no meter, showing at goal fasting and postprandial glycemic profile.  Her POCT A1c today is 6.3%, improving from previous visit of 7%.  She has been taking her ER Metformin twice daily (more than prescribed).  She is continuing to work on diet and exercise and has lost weight.  There are no episodes of hypoglycemia.  - MAEVIS MUMBY has currently uncontrolled symptomatic type 2 DM since 50 years of age.   -Recent labs reviewed.  - I had a long discussion with her about the progressive nature of diabetes and the pathology behind its complications. -her diabetes is complicated by CAD (recentMI) and she remains at a high risk for more acute and chronic complications which include CAD, CVA, CKD, retinopathy, and neuropathy. These are all discussed  in detail with her.  - Nutritional counseling repeated at each appointment due to  patients tendency to fall back in to old habits.  - The patient admits there is a room for improvement in their diet and drink choices. -  Suggestion is made for the patient to avoid simple carbohydrates from their diet including Cakes, Sweet Desserts / Pastries, Ice Cream, Soda (diet and regular), Sweet Tea, Candies, Chips, Cookies, Sweet Pastries,  Store Bought Juices, Alcohol in Excess of  1-2 drinks a day, Artificial Sweeteners, Coffee Creamer, and "Sugar-free" Products. This will help patient to have stable blood glucose profile and potentially avoid unintended weight gain.   - I encouraged the patient to switch to  unprocessed or minimally processed complex starch and increased protein intake (animal or plant source), fruits, and vegetables.   - Patient is advised to stick to a routine mealtimes to eat 3 meals  a day and avoid unnecessary snacks ( to snack only to correct hypoglycemia).  - I have approached her with the following individualized plan to manage  her diabetes and patient agrees:   -She is advised to continue Metformin 1000 mg ER to be taken daily with breakfast.  She is warned not to take more than prescribed amount due to possible kidney damage.  I discussed and initiated low dose Glipizide 5 mg XL daily with breakfast.  -She is encouraged to continue monitoring blood glucose twice daily, before breakfast and before bed, and to call the clinic if she has readings less than 70 or greater than 200 for 3 tests in a row.  - Adjustment parameters are given to her for hypo and hyperglycemia in writing.  - she is not a candidate for incretin therapy due to significantly elevated triglycerides and increased risk for pancreatitis.  - Specific targets for  A1c;  LDL, HDL,  and Triglycerides were discussed with the patient.  2) Blood Pressure /Hypertension:  her blood pressure is controlled to target.   she is advised to continue her current medications including Coreg 12.5 mg po  twice daily and Losartan 50 mg p.o. daily with breakfast.  3) Lipids/Hyperlipidemia:    Review of her recent lipid panel from 05/19/20 showed controlled LDL at 25 and significantly improved triglycerides of 125.  she is advised to continue Lipitor 80 mg daily at bedtime.  Side effects and precautions discussed with her.  4)  Weight/Diet:  her Body mass index is 33.45 kg/m.  -  clearly complicating her diabetes care.   she is a candidate for weight loss. I discussed with her the fact that loss of 5 - 10% of her  current body weight will have the most impact on her diabetes management.  Exercise, and detailed carbohydrates information provided  -  detailed on discharge instructions.  5) Chronic Care/Health Maintenance: -she is on ACEI/ARB and Statin medications and is encouraged to initiate and continue to follow up with Ophthalmology, Dentist, Podiatrist at least yearly or according to recommendations, and advised to [place]. I have recommended yearly flu vaccine and pneumonia vaccine at least every 5 years; moderate intensity exercise for up to 150 minutes weekly; and sleep for at least 7 hours a day.  - she is advised to maintain close follow up with Jani Gravel, MD for primary care needs, as well as her other providers for optimal and coordinated care.     I spent 30 minutes in the care of the patient today including review of labs from CMP, Lipids,  Thyroid Function, Hematology (current and previous including abstractions from other facilities); face-to-face time discussing  her blood glucose readings/logs, discussing hypoglycemia and hyperglycemia episodes and symptoms, medications doses, her options of short and long term treatment based on the latest standards of care / guidelines;  discussion about incorporating lifestyle medicine;  and documenting the encounter.    Please refer to Patient Instructions for Blood Glucose Monitoring and Insulin/Medications Dosing Guide"  in media tab for  additional information. Please  also refer to " Patient Self Inventory" in the Media  tab for reviewed elements of pertinent patient history.  Rosaura Carpenter participated in the discussions, expressed understanding, and voiced agreement with the above plans.  All questions were answered to her satisfaction. she is encouraged to contact clinic should she have any questions or concerns prior to her return visit.   Follow up plan: - Return in about 4 months (around 12/05/2020) for Diabetes follow up with A1c in office, Bring glucometer and logs, Previsit labs.  Rayetta Pigg, [facility] [address] [place], [place] [address] Phone: [phone] Fax: [phone]  08/05/2020, 4:20 PM

## 2020-08-11 ENCOUNTER — Ambulatory Visit
Admission: RE | Admit: 2020-08-11 | Discharge: 2020-08-11 | Disposition: A | Discharge status: Home / Self Care | Payer: 59 | Source: Ambulatory Visit | Attending: Family Medicine | Admitting: Family Medicine

## 2020-08-11 ENCOUNTER — Other Ambulatory Visit: Payer: Self-pay

## 2020-08-11 VITALS — BP 136/82 | HR 67 | Temp 98.2°F | Resp 19

## 2020-08-11 DIAGNOSIS — J069 Acute upper respiratory infection, unspecified: Secondary | ICD-10-CM

## 2020-08-11 DIAGNOSIS — J209 Acute bronchitis, unspecified: Secondary | ICD-10-CM | POA: Diagnosis not present

## 2020-08-11 MED ORDER — PREDNISONE 10 MG (21) PO TBPK: ORAL_TABLET | Freq: Every day | ORAL | 0 refills | Status: AC

## 2020-08-11 MED ORDER — METHYLPREDNISOLONE SODIUM SUCC 125 MG IJ SOLR
125.0000 mg | Freq: Once | INTRAMUSCULAR | Status: AC
  Administered 2020-08-11: 125 mg via INTRAMUSCULAR

## 2020-08-11 MED ORDER — PROMETHAZINE-DM 6.25-15 MG/5ML PO SYRP: 5.0000 mL | ORAL_SOLUTION | Freq: Four times a day (QID) | ORAL | 0 refills | Status: DC | PRN

## 2020-08-11 NOTE — ED Provider Notes (Signed)
[facility] [facility]   [id number] 08/11/20 Arrival Time: 0240   CC: COVID symptoms  SUBJECTIVE: History from: patient.  [name] is a 50 y.o. female who presents with cough, chest tightness, fatigue, wheezing for the last 5 days. Reports negative home Covid test this morning. Denies sick exposure to COVID, flu or strep. Denies recent travel. Has negative history of Covid. Has completed Covid vaccines, and booster. Has completed flu vaccine. Has taken OTC cough and cold with little relief. Cough, fatigue and chest tightness are increased with activity. Denies previous symptoms in the past. Denies fever, chills, sinus pain, rhinorrhea, nausea, changes in bowel or bladder habits.    ROS: As per HPI.  All other pertinent ROS negative.     Past Medical History:  Diagnosis Date  . Arthritis   . Carpal tunnel syndrome, bilateral   . Coronary artery disease   . Diabetes mellitus without complication ([place])    no meds; diet controlled  . Diverticulitis   . Hyperlipidemia    Phreesia 08/10/2020  . Hypertension   . Hypertriglyceridemia   . Myocardial infarction ([place])    Phreesia 08/10/2020  . PONV (postoperative nausea and vomiting)   . Sciatica    Past Surgical History:  Procedure Laterality Date  . ablasion of uterus    . CARPAL TUNNEL RELEASE  11/10/2010   Procedure: CARPAL TUNNEL RELEASE;  Surgeon: Arther Abbott, MD;  Location: [facility];  Service: Orthopedics;  Laterality: Right;  . CHOLECYSTECTOMY     [facility]  . COLON RESECTION N/A 04/08/2015   Procedure: LAPAROSCOPIC HAND ASSISTED PARTIAL COLECTOMY  CONVERTED TO OPEN AT 9735;  Surgeon: Aviva Signs, MD;  Location: [facility];  Service: General;  Laterality: N/A;  . COLONOSCOPY N/A 03/28/2014   Procedure: COLONOSCOPY;  Surgeon: Rogene Houston, MD;  [facility];  Service: Endoscopy;  Laterality: N/A;  730  . CORONARY STENT INTERVENTION  04/07/2020  . CORONARY STENT INTERVENTION N/A 04/07/2020   Procedure: CORONARY  STENT INTERVENTION;  Surgeon: Jettie Booze, MD;  Location: [facility];  Service: Cardiovascular;  Laterality: N/A;  . JOINT REPLACEMENT N/A    Phreesia 08/10/2020  . KNEE ARTHROSCOPY WITH DRILLING/MICROFRACTURE Left 10/20/2016   Procedure: LEFT KNEE ARTHROSCOPY WITH MICROFRACTURE;  Surgeon: Carole Civil, MD;  Location: [facility];  Service: Orthopedics;  Laterality: Left;  . KNEE ARTHROSCOPY WITH DRILLING/MICROFRACTURE Left 04/27/2017   Procedure: KNEE ARTHROSCOPY WITH DRILLING/MICROFRACTURE;  Surgeon: Carole Civil, MD;  Location: [facility];  Service: Orthopedics;  Laterality: Left;  . KNEE ARTHROSCOPY WITH OSTEOCHONDRAL DEFECT REPAIR Left 04/27/2017   Procedure: KNEE ARTHROSCOPY WITH OSTEOCHONDRAL DEFECT REPAIR Autograft;  Surgeon: Carole Civil, MD;  Location: [facility];  Service: Orthopedics;  Laterality: Left;  . KNEE SURGERY    . LEFT HEART CATH AND CORONARY ANGIOGRAPHY N/A 04/07/2020   Procedure: LEFT HEART CATH AND CORONARY ANGIOGRAPHY;  Surgeon: Jettie Booze, MD;  Location: [facility];  Service: Cardiovascular;  Laterality: N/A;  . PARTIAL COLECTOMY N/A 04/08/2015   Procedure: PARTIAL COLECTOMY;  Surgeon: Aviva Signs, MD;  [facility]: [facility];  Service: General;  Laterality: N/A;  . right knee arthroscopy    . SMALL INTESTINE SURGERY N/A    Phreesia 08/10/2020  . TOTAL KNEE ARTHROPLASTY Left 10/17/2017   Procedure: TOTAL KNEE ARTHROPLASTY;  Surgeon: Carole Civil, MD;  Location: [facility];  Service: Orthopedics;  Laterality: Left;  [place]   . TUBAL LIGATION     Allergies  Allergen Reactions  . Other   . Flagyl [Metronidazole] Nausea Only  . Hydrocodone Nausea And Vomiting and Other (See Comments)    Can take need to take with medication for nausea and vomitting   No current facility-administered medications on file prior to encounter.   Current Outpatient Medications on File Prior to Encounter  Medication Sig Dispense Refill  . acetaminophen  (TYLENOL) 500 MG tablet Take 500 mg by mouth every 6 (six) hours as needed for mild pain.    Marland Kitchen aspirin EC 81 MG tablet Take 1 tablet (81 mg total) by mouth daily. Swallow whole. 30 tablet 11  . atorvastatin (LIPITOR) 80 MG tablet Take 1 tablet (80 mg total) by mouth daily at 6 PM. (Patient taking differently: Take 40 mg by mouth daily at 6 PM.) 30 tablet 6  . carvedilol (COREG) 12.5 MG tablet Take 1 tablet (12.5 mg total) by mouth 2 (two) times daily. 180 tablet 1  . clopidogrel (PLAVIX) 75 MG tablet Take 1 tablet (75 mg total) by mouth daily. Take 4 tablets on day one then 1 tablet daily thereafter. 94 tablet 3  . glipiZIDE (GLUCOTROL XL) 5 MG 24 hr tablet Take 1 tablet (5 mg total) by mouth daily with breakfast. 90 tablet 3  . glucose blood (ONETOUCH VERIO) test strip Use as instructed to monitor blood glucose once daily. 100 each 12  . Lancets (ONETOUCH DELICA PLUS [id number]) MISC by Does not apply route.    Marland Kitchen losartan (COZAAR) 50 MG tablet Take 1 tablet (50 mg total) by mouth daily. 90 tablet 1  . metFORMIN (GLUCOPHAGE XR) 500 MG 24 hr tablet Take 2 tablets daily with breakfast 180 tablet 0  . nitroGLYCERIN (NITROSTAT) 0.4 MG SL tablet PLACE 1 TABLET (0.4 MG TOTAL) UNDER THE TONGUE EVERY 5 (FIVE) MINUTES AS NEEDED FOR CHEST PAIN. 25 tablet 12  . [DISCONTINUED] pantoprazole (PROTONIX) 40 MG tablet Take 1 tablet (40 mg total) by mouth daily. Further refills by PCP (Patient not taking: Reported on 05/18/2020) 30 tablet 3  . [DISCONTINUED] promethazine (PHENERGAN) 12.5 MG tablet Take 1 tablet (12.5 mg total) by mouth every 6 (six) hours as needed for nausea or vomiting. (Patient not taking: Reported on 01/08/2018) 30 tablet 0   Social History   Socioeconomic History  . Marital status: Divorced    Spouse name: Not on file  . Number of children: Not on file  . Years of education: college  . Highest education level: Not on file  Occupational History  . Occupation: Optician, dispensing: [employer]   Tobacco Use  . Smoking status: Current Every Day Smoker    Packs/day: 1.00    Years: 26.00    Pack years: 26.00    Types: Cigarettes    Last attempt to quit: 04/05/2020    Years since quitting: 0.3  . Smokeless tobacco: Never Used  Vaping Use  . Vaping Use: Never used  Substance and Sexual Activity  . Alcohol use: No    Alcohol/week: 0.0 standard drinks  . Drug use: No  . Sexual activity: Yes    Birth control/protection: Surgical  Other Topics Concern  . Not on file  Social History Narrative  . Not on file   Social Determinants of Health   Financial Resource Strain: Not on file  Food Insecurity: Not on file  Transportation Needs: Not on file  Physical Activity: Not on file  Stress: Not on file  Social Connections: Not on file  Intimate Partner  Violence: Not on file   Family History  Problem Relation Age of Onset  . Diabetes type I Mother   . Hyperlipidemia Mother   . Diabetes type II Father   . Hypertension Father   . Hyperlipidemia Father   . Diabetes type II Brother   . Diabetes type II Brother   . Cancer Other   . Diabetes Other   . Arthritis Other   . Asthma Other   . Anesthesia problems Neg Hx     OBJECTIVE:  Vitals:   08/11/20 1313  BP: 136/82  Pulse: 67  Resp: 19  Temp: 98.2 F (36.8 C)  TempSrc: Oral  SpO2: 96%     General appearance: alert; appears fatigued, but nontoxic; speaking in full sentences and tolerating own secretions HEENT: NCAT; Ears: EACs clear, TMs pearly gray; Eyes: PERRL.  EOM grossly intact. Sinuses: nontender; Nose: nares patent with clear rhinorrhea, Throat: oropharynx erythematous, cobblestoning present, tonsils non erythematous or enlarged, uvula midline  Neck: supple without LAD Lungs: unlabored respirations, symmetrical air entry; cough: moderate; no respiratory distress; diffuse wheezing throughout bilateral lung fields Heart: regular rate and rhythm.  Radial pulses 2+ symmetrical bilaterally Skin: warm and  dry Psychological: alert and cooperative; normal mood and affect  LABS:  No results found for this or any previous visit (from the past 24 hour(s)).   ASSESSMENT & PLAN:  1. Viral URI with cough   2. Acute bronchitis, unspecified organism     Meds ordered this encounter  Medications  . predniSONE (STERAPRED UNI-PAK 21 TAB) 10 MG (21) TBPK tablet    Sig: Take by mouth daily for 6 days. Take 6 tablets on day 1, 5 tablets on day 2, 4 tablets on day 3, 3 tablets on day 4, 2 tablets on day 5, 1 tablet on day 6    Dispense:  21 tablet    Refill:  0    Order Specific Question:   Supervising Provider    Answer:   Chase Picket [id number]  . promethazine-dextromethorphan (PROMETHAZINE-DM) 6.25-15 MG/5ML syrup    Sig: Take 5 mLs by mouth 4 (four) times daily as needed for cough.    Dispense:  118 mL    Refill:  0    Order Specific Question:   Supervising Provider    Answer:   Chase Picket [id number]  . methylPREDNISolone sodium succinate (SOLU-MEDROL) 125 mg/2 mL injection 125 mg   Solumedrol 125mg  IM in office  Prescribed steroid taper Promethazine cough syrup prescribed Sedation precautions given Continue supportive care at home COVID and flu testing ordered. It will take between 2-3 days for test results. Someone will contact you regarding abnormal results.   Work note provided Patient should remain in quarantine until they have received Covid results.  If negative you may resume normal activities (go back to work/school) while practicing hand hygiene, social distance, and mask wearing.  If positive, patient should remain in quarantine for at least 5 days from symptom onset AND greater than 72 hours after symptoms resolution (absence of fever without the use of fever-reducing medication and improvement in respiratory symptoms), whichever is longer Get plenty of rest and push fluids Use OTC zyrtec for nasal congestion, runny nose, and/or sore throat Use OTC flonase for nasal  congestion and runny nose Use medications daily for symptom relief Use OTC medications like ibuprofen or tylenol as needed fever or pain Call or go to the ED if you have any new or worsening symptoms such as fever,  worsening cough, shortness of breath, chest tightness, chest pain, turning blue, changes in mental status.  Reviewed expectations re: course of current medical issues. Questions answered. Outlined signs and symptoms indicating need for more acute intervention. Patient verbalized understanding. After Visit Summary given.         Faustino Congress, NP 08/11/20 1412

## 2020-08-11 NOTE — Discharge Instructions (Signed)
I have sent in a prednisone taper for you to take for 6 days. 6 tablets on day one, 5 tablets on day two, 4 tablets on day three, 3 tablets on day four, 2 tablets on day five, and 1 tablet on day six.  I have sent in cough syrup for you to take. This medication can make you sleepy. Do not drive while taking this medication.  Your COVID and Influenza tests are pending.  You should self quarantine until the test results are back.    Take Tylenol or ibuprofen as needed for fever or discomfort.  Rest and keep yourself hydrated.    Follow-up with your primary care provider if your symptoms are not improving.

## 2020-08-11 NOTE — ED Triage Notes (Addendum)
Cough, sinus congestion and shortness of breath x 1 week.  Had neg at home covid test

## 2020-08-12 LAB — COVID-19, FLU A+B NAA
Influenza A, NAA: NOT DETECTED
Influenza B, NAA: NOT DETECTED
SARS-CoV-2, NAA: NOT DETECTED

## 2020-08-13 ENCOUNTER — Ambulatory Visit: Payer: 59 | Admitting: Internal Medicine

## 2020-08-20 ENCOUNTER — Other Ambulatory Visit: Payer: Self-pay | Admitting: Nurse Practitioner

## 2020-08-20 DIAGNOSIS — E1165 Type 2 diabetes mellitus with hyperglycemia: Secondary | ICD-10-CM

## 2020-08-31 ENCOUNTER — Other Ambulatory Visit: Payer: Self-pay

## 2020-08-31 ENCOUNTER — Ambulatory Visit: Payer: 59

## 2020-08-31 ENCOUNTER — Encounter: Payer: Self-pay | Admitting: Orthopedic Surgery

## 2020-08-31 ENCOUNTER — Ambulatory Visit (INDEPENDENT_AMBULATORY_CARE_PROVIDER_SITE_OTHER): Payer: 59 | Admitting: Orthopedic Surgery

## 2020-08-31 VITALS — BP 150/89 | HR 73 | Ht 65.0 in | Wt 204.0 lb

## 2020-08-31 DIAGNOSIS — G8929 Other chronic pain: Secondary | ICD-10-CM

## 2020-08-31 DIAGNOSIS — M25562 Pain in left knee: Secondary | ICD-10-CM

## 2020-08-31 DIAGNOSIS — Z96652 Presence of left artificial knee joint: Secondary | ICD-10-CM | POA: Diagnosis not present

## 2020-08-31 DIAGNOSIS — M79672 Pain in left foot: Secondary | ICD-10-CM | POA: Insufficient documentation

## 2020-08-31 NOTE — Patient Instructions (Signed)
1st we are going to rule out infection   Then if the labs are normal we ll test for loosening of the implant

## 2020-08-31 NOTE — Progress Notes (Signed)
DMNEW PROBLEM//OFFICE VISIT  Summary assessment and plan:   50 year old female status post left total knee approximately 2 years ago presents with pain plan at with weightbearing x4 weeks.  History of bronchitis did not require antibiotics about 4 weeks ago  Recommend preliminary work-up for infection with lab work    Risk analyst Complaint  Patient presents with  . Knee Pain    Left/ states she has had pain since replacement worse recently  replacement 10/17/17   3-4 weeks pain swelling  50 year old female had a uncomplicated left total knee 2 years ago presents with pain and swelling after bout of bronchitis.  She did not require antibiotics at that time.  Most of her pain is around the tibia no femoral pain she said she did have pain and swelling but no warmth   Review of Systems  Constitutional: Negative for chills and fever.  Musculoskeletal: Negative for back pain.  Neurological: Negative for tingling.     Past Medical History:  Diagnosis Date  . Arthritis   . Carpal tunnel syndrome, bilateral   . Coronary artery disease   . Diabetes mellitus without complication ([place])    no meds; diet controlled  . Diverticulitis   . Hyperlipidemia    Phreesia 08/10/2020  . Hypertension   . Hypertriglyceridemia   . Myocardial infarction ([place])    Phreesia 08/10/2020  . PONV (postoperative nausea and vomiting)   . Sciatica     Past Surgical History:  Procedure Laterality Date  . ablasion of uterus    . CARPAL TUNNEL RELEASE  11/10/2010   Procedure: CARPAL TUNNEL RELEASE;  Surgeon: Arther Abbott, MD;  [facility];  Service: Orthopedics;  Laterality: Right;  . CHOLECYSTECTOMY     [facility]  . COLON RESECTION N/A 04/08/2015   Procedure: LAPAROSCOPIC HAND ASSISTED PARTIAL COLECTOMY  CONVERTED TO OPEN AT 4098;  Surgeon: Aviva Signs, MD;  Location: [facility];  Service: General;  Laterality: N/A;  . COLONOSCOPY N/A 03/28/2014   Procedure: COLONOSCOPY;  Surgeon: Rogene Houston, MD;   [facility];  Service: Endoscopy;  Laterality: N/A;  730  . CORONARY STENT INTERVENTION  04/07/2020  . CORONARY STENT INTERVENTION N/A 04/07/2020   Procedure: CORONARY STENT INTERVENTION;  Surgeon: Jettie Booze, MD;  Location: [facility];  Service: Cardiovascular;  Laterality: N/A;  . JOINT REPLACEMENT N/A    Phreesia 08/10/2020  . KNEE ARTHROSCOPY WITH DRILLING/MICROFRACTURE Left 10/20/2016   Procedure: LEFT KNEE ARTHROSCOPY WITH MICROFRACTURE;  Surgeon: Carole Civil, MD;  [facility];  Service: Orthopedics;  Laterality: Left;  . KNEE ARTHROSCOPY WITH DRILLING/MICROFRACTURE Left 04/27/2017   Procedure: KNEE ARTHROSCOPY WITH DRILLING/MICROFRACTURE;  Surgeon: Carole Civil, MD;  [facility]: [facility];  Service: Orthopedics;  Laterality: Left;  . KNEE ARTHROSCOPY WITH OSTEOCHONDRAL DEFECT REPAIR Left 04/27/2017   Procedure: KNEE ARTHROSCOPY WITH OSTEOCHONDRAL DEFECT REPAIR Autograft;  Surgeon: Carole Civil, MD;  Location: [facility];  Service: Orthopedics;  Laterality: Left;  . KNEE SURGERY    . LEFT HEART CATH AND CORONARY ANGIOGRAPHY N/A 04/07/2020   Procedure: LEFT HEART CATH AND CORONARY ANGIOGRAPHY;  Surgeon: Jettie Booze, MD;  Location: [facility];  Service: Cardiovascular;  Laterality: N/A;  . PARTIAL COLECTOMY N/A 04/08/2015   Procedure: PARTIAL COLECTOMY;  Surgeon: Aviva Signs, MD;  Location: [facility];  Service: General;  Laterality: N/A;  . right knee arthroscopy    . SMALL INTESTINE SURGERY N/A    Phreesia 08/10/2020  . TOTAL KNEE  ARTHROPLASTY Left 10/17/2017   Procedure: TOTAL KNEE ARTHROPLASTY;  Surgeon: Carole Civil, MD;  Location: [facility];  Service: Orthopedics;  Laterality: Left;  [facility]   . TUBAL LIGATION      Family History  Problem Relation Age of Onset  . Diabetes type I Mother   . Hyperlipidemia Mother   . Diabetes type II Father   . Hypertension Father   . Hyperlipidemia Father   . Diabetes type II Brother    . Diabetes type II Brother   . Cancer Other   . Diabetes Other   . Arthritis Other   . Asthma Other   . Anesthesia problems Neg Hx    Social History   Tobacco Use  . Smoking status: Current Every Day Smoker    Packs/day: 1.00    Years: 26.00    Pack years: 26.00    Types: Cigarettes    Last attempt to quit: 04/05/2020    Years since quitting: 0.4  . Smokeless tobacco: Never Used  Vaping Use  . Vaping Use: Never used  Substance Use Topics  . Alcohol use: No    Alcohol/week: 0.0 standard drinks  . Drug use: No    Allergies  Allergen Reactions  . Other   . Flagyl [Metronidazole] Nausea Only  . Hydrocodone Nausea And Vomiting and Other (See Comments)    Can take need to take with medication for nausea and vomitting    No outpatient medications have been marked as taking for the 08/31/20 encounter (Office Visit) with Carole Civil, MD.    BP (!) 150/89   Pulse 73   Ht 5\' 5"  (1.651 m)   Wt 204 lb (92.5 kg)   BMI 33.95 kg/m   Physical Exam  General appearance: Well-developed well-nourished no gross deformities  Cardiovascular normal pulse and perfusion normal color without edema  Neurologically o sensation loss or deficits or pathologic reflexes  Psychological: Awake alert and oriented x3 mood and affect normal  Skin no lacerations or ulcerations no nodularity no palpable masses, no erythema or nodularity  Musculoskeletal:   Incision looks normal.  No warmth or erythema.  She does have a trace joint effusion she has good flexion up to 120 degrees no evidence of extension deficit Knee is stable in all planes Muscle tone is normal  Mild tenderness left side lower back no central tenderness none on the right negative straight leg raise on the left     MEDICAL DECISION MAKING  A.  Encounter Diagnoses  Name Primary?  . Chronic pain of left knee Yes  . 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 DATA ANALYSED:   IMAGING: Interpretation of  images: X-rays show mild tilt varus tibial tray no evidence of loosening  Orders: CBC sed rate C-reactive protein Outside records reviewed: None   C. MANAGEMENT   Work-up for infection  No orders of the defined types were placed in this encounter.     Arther Abbott, MD  08/31/2020 9:08 AM

## 2020-09-01 ENCOUNTER — Telehealth: Payer: Self-pay | Admitting: Radiology

## 2020-09-01 LAB — CBC WITH DIFFERENTIAL/PLATELET

## 2020-09-01 LAB — SEDIMENTATION RATE

## 2020-09-01 LAB — C-REACTIVE PROTEIN: CRP: 5.6 mg/L (ref <8.0)

## 2020-09-01 NOTE — Telephone Encounter (Signed)
Patient will go by lab to have it done again, since specimen clotted.

## 2020-09-01 NOTE — Telephone Encounter (Signed)
Left message for patient, she had labs done at [facility], and was clotted could not run the CBC or Sed Rate I asked her to return to lab for redraw

## 2020-09-02 ENCOUNTER — Other Ambulatory Visit: Payer: Self-pay | Admitting: Orthopedic Surgery

## 2020-09-02 LAB — CBC WITH DIFFERENTIAL/PLATELET
Absolute Monocytes: 413 cells/uL (ref 200–950)
Basophils Absolute: 86 cells/uL (ref 0–200)
Basophils Relative: 0.9 %
Eosinophils Absolute: 538 cells/uL — ABNORMAL HIGH (ref 15–500)
Eosinophils Relative: 5.6 %
HCT: 41.5 % (ref 35.0–45.0)
Hemoglobin: 13.7 g/dL (ref 11.7–15.5)
Lymphs Abs: 3686 cells/uL (ref 850–3900)
MCH: 30.6 pg (ref 27.0–33.0)
MCHC: 33 g/dL (ref 32.0–36.0)
MCV: 92.6 fL (ref 80.0–100.0)
MPV: 10.7 fL (ref 7.5–12.5)
Monocytes Relative: 4.3 %
Neutro Abs: 4877 cells/uL (ref 1500–7800)
Neutrophils Relative %: 50.8 %
Platelets: 332 10*3/uL (ref 140–400)
RBC: 4.48 10*6/uL (ref 3.80–5.10)
RDW: 12.7 % (ref 11.0–15.0)
Total Lymphocyte: 38.4 %
WBC: 9.6 10*3/uL (ref 3.8–10.8)

## 2020-09-02 LAB — SEDIMENTATION RATE: Sed Rate: 9 mm/h (ref 0–20)

## 2020-09-07 ENCOUNTER — Ambulatory Visit: Payer: 59 | Admitting: Internal Medicine

## 2020-09-07 ENCOUNTER — Encounter: Payer: Self-pay | Admitting: Orthopedic Surgery

## 2020-09-07 ENCOUNTER — Ambulatory Visit (INDEPENDENT_AMBULATORY_CARE_PROVIDER_SITE_OTHER): Payer: 59 | Admitting: Orthopedic Surgery

## 2020-09-07 ENCOUNTER — Other Ambulatory Visit: Payer: Self-pay

## 2020-09-07 VITALS — BP 155/86 | HR 70 | Resp 16 | Ht 65.0 in | Wt 206.8 lb

## 2020-09-07 DIAGNOSIS — Z96652 Presence of left artificial knee joint: Secondary | ICD-10-CM

## 2020-09-07 DIAGNOSIS — M25562 Pain in left knee: Secondary | ICD-10-CM

## 2020-09-07 DIAGNOSIS — G8929 Other chronic pain: Secondary | ICD-10-CM | POA: Diagnosis not present

## 2020-09-07 DIAGNOSIS — T84038A Mechanical loosening of other internal prosthetic joint, initial encounter: Secondary | ICD-10-CM

## 2020-09-07 NOTE — Progress Notes (Signed)
Chief Complaint  Patient presents with  . Labs Only   50 year old female status post total knee with knee pain laboratory studies sent for rule out infection  White count 9.6 sed rate 9, C-reactive protein 5.6 all normal  At this time recommend bone scan for possible loosening  50 years old still complaining of burning around the tibial area problems with her gait pain when she gets out of bed and steps on the leg in the morning  Recommend three-phase bone scan to rule out loosening aseptic loosening  Encounter Diagnosis  Name Primary?  . Mechanical loosening of prosthetic knee, initial encounter ([place]) Yes

## 2020-09-10 ENCOUNTER — Telehealth: Payer: Self-pay | Admitting: Radiology

## 2020-09-10 NOTE — Telephone Encounter (Signed)
I called to schedule the bone scan / Thursday May 26 th 10am 1230 return spoke to [place]  I called patient to advise. Left message for her to call me back

## 2020-09-10 NOTE — Telephone Encounter (Signed)
Bone scan approved  Call to schedule

## 2020-09-11 NOTE — Telephone Encounter (Signed)
Scheduled / patient aware, follow up scheduled also

## 2020-09-17 ENCOUNTER — Other Ambulatory Visit: Payer: Self-pay

## 2020-09-17 ENCOUNTER — Encounter (HOSPITAL_COMMUNITY)
Admission: RE | Admit: 2020-09-17 | Discharge: 2020-09-17 | Disposition: A | Discharge status: Home / Self Care | Payer: 59 | Source: Ambulatory Visit | Attending: Orthopedic Surgery | Admitting: Orthopedic Surgery

## 2020-09-17 ENCOUNTER — Ambulatory Visit (HOSPITAL_COMMUNITY)
Admission: RE | Admit: 2020-09-17 | Discharge: 2020-09-17 | Disposition: A | Discharge status: Home / Self Care | Payer: 59 | Source: Ambulatory Visit | Attending: Orthopedic Surgery | Admitting: Orthopedic Surgery

## 2020-09-17 DIAGNOSIS — Z96659 Presence of unspecified artificial knee joint: Secondary | ICD-10-CM | POA: Diagnosis present

## 2020-09-17 DIAGNOSIS — T84038A Mechanical loosening of other internal prosthetic joint, initial encounter: Secondary | ICD-10-CM | POA: Diagnosis present

## 2020-09-17 DIAGNOSIS — Z96652 Presence of left artificial knee joint: Secondary | ICD-10-CM | POA: Diagnosis present

## 2020-09-17 MED ORDER — TECHNETIUM TC 99M MEDRONATE IV KIT
20.0000 | PACK | Freq: Once | INTRAVENOUS | Status: AC | PRN
  Administered 2020-09-17: 22 via INTRAVENOUS

## 2020-09-24 ENCOUNTER — Telehealth: Payer: Self-pay | Admitting: Orthopedic Surgery

## 2020-09-24 ENCOUNTER — Other Ambulatory Visit: Payer: Self-pay

## 2020-09-24 ENCOUNTER — Ambulatory Visit (INDEPENDENT_AMBULATORY_CARE_PROVIDER_SITE_OTHER): Payer: 59 | Admitting: Orthopedic Surgery

## 2020-09-24 ENCOUNTER — Encounter: Payer: Self-pay | Admitting: Orthopedic Surgery

## 2020-09-24 VITALS — BP 152/90 | HR 71 | Ht 65.0 in | Wt 206.0 lb

## 2020-09-24 DIAGNOSIS — Z96652 Presence of left artificial knee joint: Secondary | ICD-10-CM

## 2020-09-24 DIAGNOSIS — T84038D Mechanical loosening of other internal prosthetic joint, subsequent encounter: Secondary | ICD-10-CM | POA: Diagnosis not present

## 2020-09-24 DIAGNOSIS — Z96659 Presence of unspecified artificial knee joint: Secondary | ICD-10-CM

## 2020-09-24 MED ORDER — ACETAMINOPHEN-CODEINE #4 300-60 MG PO TABS: 1.0000 | ORAL_TABLET | ORAL | 0 refills | Status: DC | PRN

## 2020-09-24 MED ORDER — PROMETHAZINE HCL 12.5 MG PO TABS
12.5000 mg | ORAL_TABLET | Freq: Four times a day (QID) | ORAL | 0 refills | Status: DC | PRN
Start: 2020-09-24 — End: 2020-10-21

## 2020-09-24 NOTE — Telephone Encounter (Signed)
[facility] pharmacist, Caryl Pina called and asked that someone call her in regards to this patient's medication.  She said you may ask for any of the pharmacists.  Phone number [phone]  Thanks

## 2020-09-24 NOTE — Progress Notes (Signed)
Follow-up for results of a lab test  If complaint pain left knee status post left total knee  Patient was sent for bone scan to check for loosening after lab work showed no signs of infection  Patient still complains of left leg pain left knee pain primarily in the tibial area    Brief reexamination of the left knee shows slight flexion contracture knee flexion approximate 110 degrees tenderness in the tibial area primarily  She does complain of some lateral thigh pain which probably is related to L5 radicular symptoms  The bone scan does show increased uptake on the lateral and distal tibia suggestive of loosening  I reviewed this with her and let her know that a positive scan is only suggestive of loosening and surgical exploration will be required to confirm loosening  She is agreeable to left tibial revision    Assessment and plan:  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 Alternative treatment has also been reviewed, questions were encouraged and answered. The postoperative plan is also been reviewed.  Patient is on Plavix  Last A1c was 6.3 she is a diabetic  Last nitroglycerin she never had to take 1 although she has 2 stents in place  Cardiologist is branch   Last nitro never   Sugar checks aic 6.3   Meds ordered this encounter  Medications  . promethazine (PHENERGAN) 12.5 MG tablet    Sig: Take 1 tablet (12.5 mg total) by mouth every 6 (six) hours as needed for nausea or vomiting.    Dispense:  30 tablet    Refill:  0  . acetaminophen-codeine (TYLENOL #4) 300-60 MG tablet    Sig: Take 1 tablet by mouth every 4 (four) hours as needed for moderate pain.    Dispense:  30 tablet    Refill:  0

## 2020-09-24 NOTE — Patient Instructions (Addendum)
Your surgery will be at [facility] by Dr Aline Brochure  The hospital will contact you with a preoperative appointment to discuss Anesthesia. Please bring your medications with you for the appointment. They will tell you the arrival time and medication instructions when you have your preoperative evaluation. Do not wear nail polish the day of your surgery and if you take Phentermine you need to stop this medication ONE WEEK prior to your surgery.

## 2020-09-24 NOTE — Telephone Encounter (Signed)
MME too high as written since she has not had this medicine before they want to add max 5 per day to sig, I have advised that is fine it will bring the MME down, currently it is 54

## 2020-09-29 ENCOUNTER — Ambulatory Visit (INDEPENDENT_AMBULATORY_CARE_PROVIDER_SITE_OTHER): Payer: 59 | Admitting: Internal Medicine

## 2020-10-13 ENCOUNTER — Telehealth: Payer: Self-pay | Admitting: Radiology

## 2020-10-13 NOTE — Telephone Encounter (Signed)
Authorization Number #: [id number] Successfully submitted on 10/13/2020 at 11:39am.

## 2020-10-13 NOTE — Patient Instructions (Signed)
[name]  10/13/2020     @PREFPERIOPPHARMACY @   Your procedure is scheduled on 10/20/2020.   Report to [facility] at  [phone]  A.M.   Call this number if you have problems the morning of surgery:  [phone]   Remember:  Do not eat or drink after midnight.  Stop your plavix as instructed by Dr Aline Brochure.      Take these medicines the morning of surgery with A SIP OF WATER       Tylenol #3 (if needed), carvedilol, phenergan (if needed).  DO NOT take any medications for diabetes the morning of your procedure.     Please brush your teeth.  Do not wear jewelry, make-up or nail polish.  Do not wear lotions, powders, or perfumes, or deodorant.  Do not shave 48 hours prior to surgery.  Men may shave face and neck.  Do not bring valuables to the hospital.  [facility] is not responsible for any belongings or valuables.  Contacts, dentures or bridgework may not be worn into surgery.  Leave your suitcase in the car.  After surgery it may be brought to your room.  For patients admitted to the hospital, discharge time will be determined by your treatment team.  Patients discharged the day of surgery will not be allowed to drive home and must have someone with them for 24 hours.    Special instructions:    DO NOT smoke tobacco or vape for 24 hours before your procedure.  Please read over the following fact sheets that you were given. Coughing and Deep Breathing, Blood Transfusion Information, Total Joint Packet, Surgical Site Infection Prevention, Anesthesia Post-op Instructions, and Care and Recovery After Surgery      Total Knee Replacement, Care After After the procedure, it is common to have stiffness and discomfort, or redness, pain, and swelling around your cut from surgery (incision). You may also have a small amount of blood or clear fluid coming from yourincision. Follow these instructions at home: Your doctor may give you more instructions. If you  have problems, contact yourdoctor. Medicines Take over-the-counter and prescription medicines only as told by your doctor. If you were prescribed a blood thinner, take it as told by your doctor. If told, take steps to prevent problems with pooping (constipation). You may need to: Drink enough fluid to keep your pee (urine) pale yellow. Take medicines. You will be told what medicines to take. Eat foods that are high in fiber. These include beans, whole grains, and fresh fruits and vegetables. Limit foods that are high in fat and sugar. These include fried or sweet foods. Incision care  Follow instructions from your doctor about how to take care of your incision. Make sure you: Wash your hands with soap and water for at least 20 seconds before and after you change your bandage. If you cannot use soap and water, use hand sanitizer. Change your bandage. Leave stitches, staples, or skin glue in place for at least 2 weeks. Leave tape strips alone unless you are told to take them off. You may trim the edges of the tape strips if they curl up. Do not take baths, swim, or use a hot tub until your doctor says it is okay. Check your incision every day for signs of infection. Check for: More redness, swelling, or pain. More fluid or blood. Warmth. Pus or a bad smell.  Activity Rest as told by your doctor.  Get up to take short walks every 1 to 2 hours. Ask for help if you feel weak or unsteady. Follow instructions from your doctor about: Using a walker, crutches, or a cane. How much body weight you may safely support on your affected leg (weight-bearing restrictions). How to get out of a bed and chair and how to go up and down stairs. A physical therapist will show you how to do this. Do exercises as told by your doctor or physical therapist. Avoid activities that put stress on your knees. These include running, jumping rope, and doing jumping jacks. Do not play contact sports until your doctor  says it is okay. Return to your normal activities when your doctor says that it is safe. Managing pain, stiffness, and swelling  If told, put ice on your knee. To do this: Put ice in a plastic bag or use an icing device (cold flow pad). Follow your doctor's directions about how to use the icing device. Place a towel between your skin and the bag or between your skin and the icing device. Leave the ice on for 20 minutes, 2-3 times a day. Take off the ice if your skin turns bright red. This is very important. If you cannot feel pain, heat, or cold, you have a greater risk of damage to the area. Move your toes often. Raise your leg above the level of your heart while you are sitting or lying down. Use a few pillows to keep your leg straight. Do not put a pillow just under the knee. If the knee is bent for a long time, this may make the knee stiff. Wear elastic knee support as told by your doctor.  Safety  To help prevent falls: Keep floors clear of objects you may trip over. Place items that you may need within easy reach. Wear an apron or tool belt with pockets for carrying objects. This leaves your hands free to help with your balance. Do not drive or use machines until your doctor says it is safe.  General instructions Wear compression stockings as told by your doctor. Continue with breathing exercises. This helps prevent lung infection. Do not smoke or use any products that contain nicotine or tobacco. If you need help quitting, ask your doctor. If you plan to visit a dentist: Tell your doctor. Ask about things to do before your teeth are cleaned. Tell your dentist about your new joint. Keep all follow-up visits. Contact a doctor if: You have a fever or chills. You have a cough or feel short of breath. Your medicine is not controlling your pain. You have any of these signs of infection around your incision: More redness, swelling, or pain. More fluid or blood. Warmth. Pus or a  bad smell. You fall. Get help right away if: You have very bad pain. You have trouble breathing. You have chest pain. You have pain in your calf or leg. You have redness, swelling, or warmth in your calf or leg. Your incision breaks open after the stitches or staples are taken out. These symptoms may be an emergency. Get help right away. Call your local emergency services (911 in the [place] Do not wait to see if the symptoms will go away. Do not drive yourself to the hospital. Summary After the procedure, it is common to have stiffness and discomfort, or redness, pain, and swelling around your incision. Follow instructions from your doctor about how to take care of your incision. Use crutches, a walker, or a  cane as told by your doctor. If you were prescribed a blood thinner, take it as told by your doctor. Keep all follow-up visits. This information is not intended to replace advice given to you by your health care provider. Make sure you discuss any questions you have with your healthcare provider. Document Revised: 10/01/2019 Document Reviewed: 10/01/2019 Elsevier Patient Education  [place] Anesthesia, Adult, Care After This sheet gives you information about how to care for yourself after your procedure. Your health care provider may also give you more specific instructions. If you have problems or questions, contact your health careprovider. What can I expect after the procedure? After the procedure, the following side effects are common: Pain or discomfort at the IV site. Nausea. Vomiting. Sore throat. Trouble concentrating. Feeling cold or chills. Feeling weak or tired. Sleepiness and fatigue. Soreness and body aches. These side effects can affect parts of the body that were not involved in surgery. Follow these instructions at home: For the time period you were told by your health care provider:  Rest. Do not participate in activities where you could  fall or become injured. Do not drive or use machinery. Do not drink alcohol. Do not take sleeping pills or medicines that cause drowsiness. Do not make important decisions or sign legal documents. Do not take care of children on your own.  Eating and drinking Follow any instructions from your health care provider about eating or drinking restrictions. When you feel hungry, start by eating small amounts of foods that are soft and easy to digest (bland), such as toast. Gradually return to your regular diet. Drink enough fluid to keep your urine pale yellow. If you vomit, rehydrate by drinking water, juice, or clear broth. General instructions If you have sleep apnea, surgery and certain medicines can increase your risk for breathing problems. Follow instructions from your health care provider about wearing your sleep device: Anytime you are sleeping, including during daytime naps. While taking prescription pain medicines, sleeping medicines, or medicines that make you drowsy. Have a responsible adult stay with you for the time you are told. It is important to have someone help care for you until you are awake and alert. Return to your normal activities as told by your health care provider. Ask your health care provider what activities are safe for you. Take over-the-counter and prescription medicines only as told by your health care provider. If you smoke, do not smoke without supervision. Keep all follow-up visits as told by your health care provider. This is important. Contact a health care provider if: You have nausea or vomiting that does not get better with medicine. You cannot eat or drink without vomiting. You have pain that does not get better with medicine. You are unable to pass urine. You develop a skin rash. You have a fever. You have redness around your IV site that gets worse. Get help right away if: You have difficulty breathing. You have chest pain. You have blood in  your urine or stool, or you vomit blood. Summary After the procedure, it is common to have a sore throat or nausea. It is also common to feel tired. Have a responsible adult stay with you for the time you are told. It is important to have someone help care for you until you are awake and alert. When you feel hungry, start by eating small amounts of foods that are soft and easy to digest (bland), such as toast. Gradually return to your regular  diet. Drink enough fluid to keep your urine pale yellow. Return to your normal activities as told by your health care provider. Ask your health care provider what activities are safe for you. This information is not intended to replace advice given to you by your health care provider. Make sure you discuss any questions you have with your healthcare provider. Document Revised: 12/26/2019 Document Reviewed: 07/25/2019 Elsevier Patient Education  2022 [place]. How to Use Chlorhexidine for Bathing Chlorhexidine gluconate (CHG) is a germ-killing (antiseptic) solution that is used to clean the skin. It can get rid of the bacteria that normally live on the skin and can keep them away for about 24 hours. To clean your skin with CHG, you may be given: A CHG solution to use in the shower or as part of a sponge bath. A prepackaged cloth that contains CHG. Cleaning your skin with CHG may help lower the risk for infection: While you are staying in the intensive care unit of the hospital. If you have a vascular access, such as a central line, to provide short-term or long-term access to your veins. If you have a catheter to drain urine from your bladder. If you are on a ventilator. A ventilator is a machine that helps you breathe by moving air in and out of your lungs. After surgery. What are the risks? Risks of using CHG include: A skin reaction. Hearing loss, if CHG gets in your ears. Eye injury, if CHG gets in your eyes and is not rinsed out. The CHG  product catching fire. Make sure that you avoid smoking and flames after applying CHG to your skin. Do not use CHG: If you have a chlorhexidine allergy or have previously reacted to chlorhexidine. On babies younger than 24 months of age. How to use CHG solution Use CHG only as told by your health care provider, and follow the instructions on the label. Use the full amount of CHG as directed. Usually, this is one bottle. During a shower Follow these steps when using CHG solution during a shower (unless your health care provider gives you different instructions): Start the shower. Use your normal soap and shampoo to wash your face and hair. Turn off the shower or move out of the shower stream. Pour the CHG onto a clean washcloth. Do not use any type of brush or rough-edged sponge. Starting at your neck, lather your body down to your toes. Make sure you follow these instructions: If you will be having surgery, pay special attention to the part of your body where you will be having surgery. Scrub this area for at least 1 minute. Do not use CHG on your head or face. If the solution gets into your ears or eyes, rinse them well with water. Avoid your genital area. Avoid any areas of skin that have broken skin, cuts, or scrapes. Scrub your back and under your arms. Make sure to wash skin folds. Let the lather sit on your skin for 1-2 minutes or as long as told by your health care provider. Thoroughly rinse your entire body in the shower. Make sure that all body creases and crevices are rinsed well. Dry off with a clean towel. Do not put any substances on your body afterward--such as powder, lotion, or perfume--unless you are told to do so by your health care provider. Only use lotions that are recommended by the manufacturer. Put on clean clothes or pajamas. If it is the night before your surgery, sleep in clean  sheets.  During a sponge bath Follow these steps when using CHG solution during a  sponge bath (unless your health care provider gives you different instructions): Use your normal soap and shampoo to wash your face and hair. Pour the CHG onto a clean washcloth. Starting at your neck, lather your body down to your toes. Make sure you follow these instructions: If you will be having surgery, pay special attention to the part of your body where you will be having surgery. Scrub this area for at least 1 minute. Do not use CHG on your head or face. If the solution gets into your ears or eyes, rinse them well with water. Avoid your genital area. Avoid any areas of skin that have broken skin, cuts, or scrapes. Scrub your back and under your arms. Make sure to wash skin folds. Let the lather sit on your skin for 1-2 minutes or as long as told by your health care provider. Using a different clean, wet washcloth, thoroughly rinse your entire body. Make sure that all body creases and crevices are rinsed well. Dry off with a clean towel. Do not put any substances on your body afterward--such as powder, lotion, or perfume--unless you are told to do so by your health care provider. Only use lotions that are recommended by the manufacturer. Put on clean clothes or pajamas. If it is the night before your surgery, sleep in clean sheets. How to use CHG prepackaged cloths Only use CHG cloths as told by your health care provider, and follow the instructions on the label. Use the CHG cloth on clean, dry skin. Do not use the CHG cloth on your head or face unless your health care provider tells you to. When washing with the CHG cloth: Avoid your genital area. Avoid any areas of skin that have broken skin, cuts, or scrapes. Before surgery Follow these steps when using a CHG cloth to clean before surgery (unless your health care provider gives you different instructions): Using the CHG cloth, vigorously scrub the part of your body where you will be having surgery. Scrub using a back-and-forth motion  for 3 minutes. The area on your body should be completely wet with CHG when you are done scrubbing. Do not rinse. Discard the cloth and let the area air-dry. Do not put any substances on the area afterward, such as powder, lotion, or perfume. Put on clean clothes or pajamas. If it is the night before your surgery, sleep in clean sheets.  For general bathing Follow these steps when using CHG cloths for general bathing (unless your health care provider gives you different instructions). Use a separate CHG cloth for each area of your body. Make sure you wash between any folds of skin and between your fingers and toes. Wash your body in the following order, switching to a new cloth after each step: The front of your neck, shoulders, and chest. Both of your arms, under your arms, and your hands. Your stomach and groin area, avoiding the genitals. Your right leg and foot. Your left leg and foot. The back of your neck, your back, and your buttocks. Do not rinse. Discard the cloth and let the area air-dry. Do not put any substances on your body afterward--such as powder, lotion, or perfume--unless you are told to do so by your health care provider. Only use lotions that are recommended by the manufacturer. Put on clean clothes or pajamas. Contact a health care provider if: Your skin gets irritated after scrubbing. You  have questions about using your solution or cloth. Get help right away if: Your eyes become very red or swollen. Your eyes itch badly. Your skin itches badly and is red or swollen. Your hearing changes. You have trouble seeing. You have swelling or tingling in your mouth or throat. You have trouble breathing. You swallow any chlorhexidine. Summary Chlorhexidine gluconate (CHG) is a germ-killing (antiseptic) solution that is used to clean the skin. Cleaning your skin with CHG may help to lower your risk for infection. You may be given CHG to use for bathing. It may be in a bottle  or in a prepackaged cloth to use on your skin. Carefully follow your health care provider's instructions and the instructions on the product label. Do not use CHG if you have a chlorhexidine allergy. Contact your health care provider if your skin gets irritated after scrubbing. This information is not intended to replace advice given to you by your health care provider. Make sure you discuss any questions you have with your healthcare provider. Document Revised: 08/23/2019 Document Reviewed: 09/27/2019 Elsevier Patient Education  [place].

## 2020-10-13 NOTE — Telephone Encounter (Signed)
I called her to remind her to d/c the Plavix 5d before surgery, she has voiced understanding.

## 2020-10-13 NOTE — Telephone Encounter (Signed)
-----   Message from Carole Civil, MD sent at 10/13/2020  1:25 PM EDT ----- Regarding: RE: plavix Yes but remind her  ----- Message ----- From: Candice Camp, RT Sent: 10/13/2020  11:43 AM EDT To: Carole Civil, MD Subject: FW: plavix                                      ----- Message ----- From: Encarnacion Chu, RN Sent: 10/13/2020  11:20 AM EDT To: Candice Camp, RT Subject: plavix                                         Hey Santiago Graf! Is Peni Rupard to stop her plavix 5 days prior to surgery? IF so, Is she aware of this?

## 2020-10-15 ENCOUNTER — Other Ambulatory Visit: Payer: Self-pay | Admitting: Orthopedic Surgery

## 2020-10-15 ENCOUNTER — Telehealth: Payer: Self-pay | Admitting: Orthopedic Surgery

## 2020-10-15 DIAGNOSIS — T84038D Mechanical loosening of other internal prosthetic joint, subsequent encounter: Secondary | ICD-10-CM

## 2020-10-15 DIAGNOSIS — Z96659 Presence of unspecified artificial knee joint: Secondary | ICD-10-CM

## 2020-10-15 DIAGNOSIS — Z96652 Presence of left artificial knee joint: Secondary | ICD-10-CM

## 2020-10-15 NOTE — Telephone Encounter (Signed)
Patient has a question about her surgery and PT.   Please call her back

## 2020-10-16 ENCOUNTER — Encounter (HOSPITAL_COMMUNITY): Payer: Self-pay

## 2020-10-16 ENCOUNTER — Encounter (HOSPITAL_COMMUNITY)
Admission: RE | Admit: 2020-10-16 | Discharge: 2020-10-16 | Disposition: A | Discharge status: Home / Self Care | Payer: 59 | Source: Ambulatory Visit | Attending: Orthopedic Surgery | Admitting: Orthopedic Surgery

## 2020-10-16 ENCOUNTER — Other Ambulatory Visit (HOSPITAL_COMMUNITY)
Admission: RE | Admit: 2020-10-16 | Discharge: 2020-10-16 | Disposition: A | Discharge status: Home / Self Care | Payer: 59 | Source: Ambulatory Visit | Attending: Orthopedic Surgery | Admitting: Orthopedic Surgery

## 2020-10-16 ENCOUNTER — Other Ambulatory Visit: Payer: Self-pay

## 2020-10-16 DIAGNOSIS — Z20822 Contact with and (suspected) exposure to covid-19: Secondary | ICD-10-CM | POA: Insufficient documentation

## 2020-10-16 DIAGNOSIS — Z01812 Encounter for preprocedural laboratory examination: Secondary | ICD-10-CM | POA: Diagnosis present

## 2020-10-16 LAB — CBC WITH DIFFERENTIAL/PLATELET
Abs Immature Granulocytes: 0.05 10*3/uL (ref 0.00–0.07)
Basophils Absolute: 0.1 10*3/uL (ref 0.0–0.1)
Basophils Relative: 1 %
Eosinophils Absolute: 0.7 10*3/uL — ABNORMAL HIGH (ref 0.0–0.5)
Eosinophils Relative: 6 %
HCT: 43.8 % (ref 36.0–46.0)
Hemoglobin: 14.5 g/dL (ref 12.0–15.0)
Immature Granulocytes: 0 %
Lymphocytes Relative: 35 %
Lymphs Abs: 4.5 10*3/uL — ABNORMAL HIGH (ref 0.7–4.0)
MCH: 30.5 pg (ref 26.0–34.0)
MCHC: 33.1 g/dL (ref 30.0–36.0)
MCV: 92.2 fL (ref 80.0–100.0)
Monocytes Absolute: 0.7 10*3/uL (ref 0.1–1.0)
Monocytes Relative: 5 %
Neutro Abs: 7 10*3/uL (ref 1.7–7.7)
Neutrophils Relative %: 53 %
Platelets: 308 10*3/uL (ref 150–400)
RBC: 4.75 MIL/uL (ref 3.87–5.11)
RDW: 14.3 % (ref 11.5–15.5)
WBC: 13.1 10*3/uL — ABNORMAL HIGH (ref 4.0–10.5)
nRBC: 0 % (ref 0.0–0.2)

## 2020-10-16 LAB — BASIC METABOLIC PANEL
Anion gap: 7 (ref 5–15)
BUN: 19 mg/dL (ref 6–20)
CO2: 19 mmol/L — ABNORMAL LOW (ref 22–32)
Calcium: 8.8 mg/dL — ABNORMAL LOW (ref 8.9–10.3)
Chloride: 107 mmol/L (ref 98–111)
Creatinine, Ser: 0.52 mg/dL (ref 0.44–1.00)
GFR, Estimated: >60 mL/min (ref >60)
Glucose, Bld: 81 mg/dL (ref 70–99)
Potassium: 4.1 mmol/L (ref 3.5–5.1)
Sodium: 133 mmol/L — ABNORMAL LOW (ref 135–145)

## 2020-10-16 LAB — PREPARE RBC (CROSSMATCH)

## 2020-10-16 LAB — SARS CORONAVIRUS 2 (TAT 6-24 HRS): SARS Coronavirus 2: NEGATIVE

## 2020-10-17 LAB — HEMOGLOBIN A1C
Hgb A1c MFr Bld: 6.6 % — ABNORMAL HIGH (ref 4.8–5.6)
Mean Plasma Glucose: 143 mg/dL

## 2020-10-19 NOTE — Telephone Encounter (Signed)
Patient just called back again.  She asks that you call her ASAP  Thanks

## 2020-10-19 NOTE — H&P (Signed)
TOTAL KNEE REVISION ADMISSION H&P  Patient is being admitted for left revision total knee arthroplasty.  Subjective:  Chief Complaint:left knee pain.  HPI: [name], 50 y.o. 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  She has had pain in the knee since the surgery and pain was difficult to control after the total knee procedure  She came back in May of this year and had an infection work-up which was negative.  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    Chondral defect of condyle of right femur    S/P left knee arthroscopy 04/27/17 03/28/2017   S/P total knee replacement, 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    Abdominal pain 02/17/2015   Nausea without vomiting 02/17/2015   Diverticulitis of intestine with abscess 01/08/2015   Acute diverticulitis 01/08/2015   Nausea 12/31/2014   Diverticulitis 12/31/2014   Hyperglycemia 12/31/2014   Medication reaction 12/31/2014   Diverticulitis of large intestine without perforation or abscess without bleeding    Thrush, oral    Fatty liver 03/11/2014   Family hx of colon cancer requiring screening colonoscopy 03/11/2014   Rt flank pain  03/11/2014   Ulnar nerve compression 06/14/2011   Compartment syndrome, nontraumatic 06/08/2011   LOW BACK PAIN 11/07/2007   SCIATICA 11/07/2007   LUMBAR SPASM 11/07/2007   Past Medical History:  Diagnosis Date   Arthritis    Carpal tunnel syndrome, bilateral    Coronary artery disease    Diabetes mellitus without complication ([place])    no meds; diet controlled   Diverticulitis    Hyperlipidemia    Phreesia 08/10/2020   Hypertension    Hypertriglyceridemia    Myocardial infarction ([place])    Phreesia 08/10/2020   PONV (postoperative nausea and vomiting)    Sciatica     Past Surgical History:  Procedure Laterality Date   ablasion of uterus     CARDIAC CATHETERIZATION     CARPAL TUNNEL RELEASE  11/10/2010   Procedure: CARPAL TUNNEL RELEASE;  Surgeon: Arther Abbott, MD;  Location: [facility];  Service: Orthopedics;  Laterality: Right;   CHOLECYSTECTOMY     [facility]   COLON RESECTION N/A 04/08/2015   Procedure: LAPAROSCOPIC HAND ASSISTED PARTIAL COLECTOMY  CONVERTED TO OPEN AT 3244;  Surgeon: Aviva Signs, MD;  Location: [facility];  Service: General;  Laterality: N/A;   COLONOSCOPY N/A 03/28/2014   Procedure: COLONOSCOPY;  Surgeon: Rogene Houston, MD;  Location: [facility];  Service: Endoscopy;  Laterality: N/A;  730   CORONARY STENT INTERVENTION  04/07/2020   CORONARY STENT INTERVENTION N/A 04/07/2020   Procedure: CORONARY STENT INTERVENTION;  Surgeon:  Jettie Booze, MD;  Location: [place] [facility];  Service: Cardiovascular;  Laterality: N/A;   JOINT REPLACEMENT N/A    Phreesia 08/10/2020   KNEE ARTHROSCOPY WITH DRILLING/MICROFRACTURE Left 10/20/2016   Procedure: LEFT KNEE ARTHROSCOPY WITH MICROFRACTURE;  Surgeon: Carole Civil, MD;  Location: [facility];  Service: Orthopedics;  Laterality: Left;   KNEE ARTHROSCOPY WITH DRILLING/MICROFRACTURE Left 04/27/2017   Procedure: KNEE ARTHROSCOPY WITH DRILLING/MICROFRACTURE;  Surgeon: Carole Civil, MD;  Location: [facility];   Service: Orthopedics;  Laterality: Left;   KNEE ARTHROSCOPY WITH OSTEOCHONDRAL DEFECT REPAIR Left 04/27/2017   Procedure: KNEE ARTHROSCOPY WITH OSTEOCHONDRAL DEFECT REPAIR Autograft;  Surgeon: Carole Civil, MD;  Location: [facility];  Service: Orthopedics;  Laterality: Left;   KNEE SURGERY     LEFT HEART CATH AND CORONARY ANGIOGRAPHY N/A 04/07/2020   Procedure: LEFT HEART CATH AND CORONARY ANGIOGRAPHY;  Surgeon: Jettie Booze, MD;  Location: [place] [facility];  Service: Cardiovascular;  Laterality: N/A;   PARTIAL COLECTOMY N/A 04/08/2015   Procedure: PARTIAL COLECTOMY;  Surgeon: Aviva Signs, MD;  Location: [facility];  Service: General;  Laterality: N/A;   right knee arthroscopy     SMALL INTESTINE SURGERY N/A    Phreesia 08/10/2020   TOTAL KNEE ARTHROPLASTY Left 10/17/2017   Procedure: TOTAL KNEE ARTHROPLASTY;  Surgeon: Carole Civil, MD;  Location: [facility];  Service: Orthopedics;  Laterality: Left;  DePuy    TUBAL LIGATION      No current facility-administered medications for this encounter.   Current Outpatient Medications  Medication Sig Dispense Refill Last Dose   acetaminophen-codeine (TYLENOL #4) 300-60 MG tablet Take 1 tablet by mouth every 4 (four) hours as needed for moderate pain. (Patient taking differently: Take 1 tablet by mouth daily.) 30 tablet 0    aspirin EC 81 MG tablet Take 1 tablet (81 mg total) by mouth daily. Swallow whole. 30 tablet 11    atorvastatin (LIPITOR) 80 MG tablet Take 1 tablet (80 mg total) by mouth daily at 6 PM. 30 tablet 6    carvedilol (COREG) 12.5 MG tablet Take 1 tablet (12.5 mg total) by mouth 2 (two) times daily. 180 tablet 1    cholecalciferol (VITAMIN D) 25 MCG (1000 UNIT) tablet Take 1,000 Units by mouth daily.      clopidogrel (PLAVIX) 75 MG tablet Take 1 tablet (75 mg total) by mouth daily. Take 4 tablets on day one then 1 tablet daily thereafter. (Patient taking differently: Take 75 mg by mouth daily.) 94 tablet 3    glipiZIDE  (GLUCOTROL XL) 5 MG 24 hr tablet Take 1 tablet (5 mg total) by mouth daily with breakfast. 90 tablet 3    Krill Oil (OMEGA-3) 500 MG CAPS Take 1,000 mg by mouth daily.      losartan (COZAAR) 50 MG tablet Take 1 tablet (50 mg total) by mouth daily. 90 tablet 1    metFORMIN (GLUCOPHAGE-XR) 500 MG 24 hr tablet TAKE 2 TABLETS BY MOUTH ONCE DAILY WITH BREAKFAST (Patient taking differently: Take 1,000 mg by mouth.) 180 tablet 0    nitroGLYCERIN (NITROSTAT) 0.4 MG SL tablet PLACE 1 TABLET (0.4 MG TOTAL) UNDER THE TONGUE EVERY 5 (FIVE) MINUTES AS NEEDED FOR CHEST PAIN. (Patient taking differently: Place 0.4 mg under the tongue every 5 (five) minutes as needed for chest pain.) 25 tablet 12    promethazine (PHENERGAN) 12.5 MG tablet Take 1 tablet (12.5 mg total) by mouth every 6 (six) hours as needed for nausea or  vomiting. 30 tablet 0    glucose blood (ONETOUCH VERIO) test strip Use as instructed to monitor blood glucose once daily. 100 each 12    Lancets (ONETOUCH DELICA PLUS [id number]) MISC by Does not apply route.      Allergies  Allergen Reactions   Other    Flagyl [Metronidazole] Nausea Only   Hydrocodone Nausea And Vomiting and Other (See Comments)    Can take need to take with medication for nausea and vomitting    Social History   Tobacco Use   Smoking status: Every Day    Packs/day: 1.50    Years: 26.00    Pack years: 39.00    Types: Cigarettes    Last attempt to quit: 04/05/2020    Years since quitting: 0.5   Smokeless tobacco: Never  Substance Use Topics   Alcohol use: No    Alcohol/week: 0.0 standard drinks    Family History  Problem Relation Age of Onset   Diabetes type I Mother    Hyperlipidemia Mother    Diabetes type II Father    Hypertension Father    Hyperlipidemia Father    Diabetes type II Brother    Diabetes type II Brother    Cancer Other    Diabetes Other    Arthritis Other    Asthma Other    Anesthesia problems Neg Hx       Review of Systems   She  reports no evidence of fever chills nausea vomiting diarrhea chest pain or shortness of breath  Objective:  Physical Exam  Developmental abnormalities none nutrition normal body habitus medium deformities none grooming normal  Peripheral vascular system: No swelling or varicosities  Pulses and temperature normal without edema or tenderness in the lower extremities  Lymph nodes in the groin area are normal  The patient's gait is remarkable for heel-to-toe gait favoring the left lower extremity  The left knee has a well-healed incision without any erythema there is no malalignment.  There is no crepitance  Her range of motion is limited in flexion and extension with an overall range of motion of 3 to 110 degrees the knee feels stable in flexion as well as extension muscle strength and tone are normal her other extremities show no abnormalities  The skin and subcutaneous tissues are without scars rashes lesions or ulcers no erythema other than the surgical scar noted  There are no sensory deficits  She does have some mild tenderness in the lower back she is oriented x3 her mood and affect are normal   Vital signs in last 24 hours:    Labs:  Estimated body mass index is 34.95 kg/m as calculated from the following:   Height as of 10/16/20: 5\' 5"  (1.651 m).   Weight as of 10/16/20: 95.3 kg.  Imaging Review Plain radiographs show mild varus of the tibial component with hotspot noted on bone scan near the tibial component in the area of discomfort  Assessment/Plan:  Probable loose tibial component left knee after left total knee   The procedure has been fully reviewed with the patient; The risks and benefits of surgery have been discussed and explained and understood. Alternative treatment has also been reviewed, questions were encouraged and answered. The postoperative plan is also been reviewed.   Revision tibial component left total knee

## 2020-10-19 NOTE — Telephone Encounter (Signed)
Patient called back and asks that her surgery not be canceled yet.  She said she is waiting for a clinician from her insurance company to call her back.

## 2020-10-19 NOTE — Telephone Encounter (Signed)
She is waiting for someone to call her back from the insurance company

## 2020-10-19 NOTE — Telephone Encounter (Signed)
FYI I called patient told her may have to cancel surgery tomorrow, insurance has not approved it  She said she wants to try to call them

## 2020-10-19 NOTE — Addendum Note (Signed)
Addended byCandice Camp on: 10/19/2020 03:25 PM   Modules accepted: Orders

## 2020-10-19 NOTE — Telephone Encounter (Signed)
It is approved // they have approved the surgery and a 1 day stay

## 2020-10-19 NOTE — Telephone Encounter (Signed)
Still pending/ should I cancel case? Surgery tomorrow

## 2020-10-20 ENCOUNTER — Inpatient Hospital Stay (HOSPITAL_COMMUNITY)
Admission: RE | Admit: 2020-10-20 | Discharge: 2020-10-21 | DRG: 468 | Disposition: A | Discharge status: Home / Self Care | Payer: 59 | Attending: Orthopedic Surgery | Admitting: Orthopedic Surgery

## 2020-10-20 ENCOUNTER — Encounter (HOSPITAL_COMMUNITY)
Admission: RE | Disposition: A | Discharge status: Home / Self Care | Payer: Self-pay | Source: Home / Self Care | Attending: Orthopedic Surgery

## 2020-10-20 ENCOUNTER — Encounter (HOSPITAL_COMMUNITY): Payer: Self-pay | Admitting: Orthopedic Surgery

## 2020-10-20 ENCOUNTER — Other Ambulatory Visit: Payer: Self-pay

## 2020-10-20 ENCOUNTER — Inpatient Hospital Stay (HOSPITAL_COMMUNITY): Payer: 59 | Admitting: Anesthesiology

## 2020-10-20 ENCOUNTER — Inpatient Hospital Stay (HOSPITAL_COMMUNITY): Payer: 59

## 2020-10-20 DIAGNOSIS — Z7902 Long term (current) use of antithrombotics/antiplatelets: Secondary | ICD-10-CM | POA: Diagnosis not present

## 2020-10-20 DIAGNOSIS — E119 Type 2 diabetes mellitus without complications: Secondary | ICD-10-CM | POA: Diagnosis present

## 2020-10-20 DIAGNOSIS — Z79899 Other long term (current) drug therapy: Secondary | ICD-10-CM | POA: Diagnosis not present

## 2020-10-20 DIAGNOSIS — F17210 Nicotine dependence, cigarettes, uncomplicated: Secondary | ICD-10-CM | POA: Diagnosis present

## 2020-10-20 DIAGNOSIS — T84033D Mechanical loosening of internal left knee prosthetic joint, subsequent encounter: Secondary | ICD-10-CM

## 2020-10-20 DIAGNOSIS — T84033A Mechanical loosening of internal left knee prosthetic joint, initial encounter: Secondary | ICD-10-CM | POA: Diagnosis not present

## 2020-10-20 DIAGNOSIS — M5430 Sciatica, unspecified side: Secondary | ICD-10-CM | POA: Diagnosis present

## 2020-10-20 DIAGNOSIS — Z884 Allergy status to anesthetic agent status: Secondary | ICD-10-CM

## 2020-10-20 DIAGNOSIS — I10 Essential (primary) hypertension: Secondary | ICD-10-CM | POA: Diagnosis not present

## 2020-10-20 DIAGNOSIS — Z800 Family history of malignant neoplasm of digestive organs: Secondary | ICD-10-CM | POA: Diagnosis not present

## 2020-10-20 DIAGNOSIS — Z7984 Long term (current) use of oral hypoglycemic drugs: Secondary | ICD-10-CM | POA: Diagnosis not present

## 2020-10-20 DIAGNOSIS — E781 Pure hyperglyceridemia: Secondary | ICD-10-CM | POA: Diagnosis present

## 2020-10-20 DIAGNOSIS — Z96652 Presence of left artificial knee joint: Secondary | ICD-10-CM

## 2020-10-20 DIAGNOSIS — E785 Hyperlipidemia, unspecified: Secondary | ICD-10-CM | POA: Diagnosis present

## 2020-10-20 DIAGNOSIS — Z8249 Family history of ischemic heart disease and other diseases of the circulatory system: Secondary | ICD-10-CM | POA: Diagnosis not present

## 2020-10-20 DIAGNOSIS — Z7982 Long term (current) use of aspirin: Secondary | ICD-10-CM

## 2020-10-20 DIAGNOSIS — Z885 Allergy status to narcotic agent status: Secondary | ICD-10-CM

## 2020-10-20 DIAGNOSIS — Z825 Family history of asthma and other chronic lower respiratory diseases: Secondary | ICD-10-CM

## 2020-10-20 DIAGNOSIS — Z833 Family history of diabetes mellitus: Secondary | ICD-10-CM

## 2020-10-20 DIAGNOSIS — I252 Old myocardial infarction: Secondary | ICD-10-CM

## 2020-10-20 DIAGNOSIS — Y792 Prosthetic and other implants, materials and accessory orthopedic devices associated with adverse incidents: Secondary | ICD-10-CM | POA: Diagnosis present

## 2020-10-20 DIAGNOSIS — I2510 Atherosclerotic heart disease of native coronary artery without angina pectoris: Secondary | ICD-10-CM | POA: Diagnosis present

## 2020-10-20 DIAGNOSIS — Z9049 Acquired absence of other specified parts of digestive tract: Secondary | ICD-10-CM | POA: Diagnosis not present

## 2020-10-20 DIAGNOSIS — Z83438 Family history of other disorder of lipoprotein metabolism and other lipidemia: Secondary | ICD-10-CM | POA: Diagnosis not present

## 2020-10-20 HISTORY — PX: TOTAL KNEE REVISION: SHX996

## 2020-10-20 LAB — GLUCOSE, CAPILLARY
Glucose-Capillary: 156 mg/dL — ABNORMAL HIGH (ref 70–99)
Glucose-Capillary: 155 mg/dL — ABNORMAL HIGH (ref 70–99)
Glucose-Capillary: 198 mg/dL — ABNORMAL HIGH (ref 70–99)
Glucose-Capillary: 271 mg/dL — ABNORMAL HIGH (ref 70–99)

## 2020-10-20 SURGERY — TOTAL KNEE REVISION
Anesthesia: Spinal | Site: Knee | Laterality: Left

## 2020-10-20 MED ORDER — DEXAMETHASONE SODIUM PHOSPHATE 10 MG/ML IJ SOLN
INTRAMUSCULAR | Status: DC | PRN
  Administered 2020-10-20: 6 mg via INTRAVENOUS

## 2020-10-20 MED ORDER — METOCLOPRAMIDE HCL 10 MG PO TABS
5.0000 mg | ORAL_TABLET | Freq: Three times a day (TID) | ORAL | Status: DC | PRN
  Administered 2020-10-21: 10 mg via ORAL
  Filled 2020-10-20: qty 1

## 2020-10-20 MED ORDER — PROPOFOL 10 MG/ML IV BOLUS
INTRAVENOUS | Status: AC
  Filled 2020-10-20: qty 40

## 2020-10-20 MED ORDER — CHLORHEXIDINE GLUCONATE 0.12 % MT SOLN
15.0000 mL | Freq: Once | OROMUCOSAL | Status: AC
  Administered 2020-10-20: 15 mL via OROMUCOSAL

## 2020-10-20 MED ORDER — ONDANSETRON HCL 4 MG/2ML IJ SOLN
INTRAMUSCULAR | Status: AC
  Filled 2020-10-20: qty 2

## 2020-10-20 MED ORDER — ONDANSETRON HCL 4 MG PO TABS
4.0000 mg | ORAL_TABLET | Freq: Four times a day (QID) | ORAL | Status: DC | PRN
  Administered 2020-10-20 – 2020-10-21 (×2): 4 mg via ORAL
  Filled 2020-10-20 (×2): qty 1

## 2020-10-20 MED ORDER — DEXAMETHASONE SODIUM PHOSPHATE 4 MG/ML IJ SOLN
INTRAMUSCULAR | Status: DC | PRN
  Administered 2020-10-20: 8 mg via PERINEURAL

## 2020-10-20 MED ORDER — CEFAZOLIN SODIUM-DEXTROSE 2-4 GM/100ML-% IV SOLN
2.0000 g | Freq: Four times a day (QID) | INTRAVENOUS | Status: AC
  Administered 2020-10-20 (×2): 2 g via INTRAVENOUS
  Filled 2020-10-20 (×2): qty 100

## 2020-10-20 MED ORDER — ALUM & MAG HYDROXIDE-SIMETH 200-200-20 MG/5ML PO SUSP: 30.0000 mL | ORAL | Status: DC | PRN

## 2020-10-20 MED ORDER — BUPIVACAINE HCL (PF) 0.25 % IJ SOLN
INTRAMUSCULAR | Status: AC
  Filled 2020-10-20: qty 30

## 2020-10-20 MED ORDER — OXYCODONE HCL 5 MG PO TABS
5.0000 mg | ORAL_TABLET | Freq: Once | ORAL | Status: AC
  Administered 2020-10-20: 5 mg via ORAL
  Filled 2020-10-20: qty 1

## 2020-10-20 MED ORDER — MEPERIDINE HCL 50 MG/ML IJ SOLN: 6.2500 mg | INTRAMUSCULAR | Status: DC | PRN

## 2020-10-20 MED ORDER — METOCLOPRAMIDE HCL 5 MG/ML IJ SOLN
5.0000 mg | Freq: Three times a day (TID) | INTRAMUSCULAR | Status: DC | PRN
  Administered 2020-10-20: 10 mg via INTRAVENOUS
  Filled 2020-10-20: qty 2

## 2020-10-20 MED ORDER — MIDAZOLAM HCL 2 MG/2ML IJ SOLN
INTRAMUSCULAR | Status: AC
  Filled 2020-10-20: qty 2

## 2020-10-20 MED ORDER — ORAL CARE MOUTH RINSE: 15.0000 mL | Freq: Once | OROMUCOSAL | Status: AC

## 2020-10-20 MED ORDER — TRAMADOL HCL 50 MG PO TABS
50.0000 mg | ORAL_TABLET | Freq: Four times a day (QID) | ORAL | Status: DC
  Administered 2020-10-20 – 2020-10-21 (×4): 50 mg via ORAL
  Filled 2020-10-20 (×4): qty 1

## 2020-10-20 MED ORDER — CLOPIDOGREL BISULFATE 75 MG PO TABS
75.0000 mg | ORAL_TABLET | Freq: Every day | ORAL | Status: DC
  Administered 2020-10-21: 75 mg via ORAL
  Filled 2020-10-20 (×2): qty 1

## 2020-10-20 MED ORDER — ONDANSETRON HCL 4 MG/2ML IJ SOLN
4.0000 mg | Freq: Four times a day (QID) | INTRAMUSCULAR | Status: DC | PRN
  Administered 2020-10-20: 4 mg via INTRAVENOUS
  Filled 2020-10-20: qty 2

## 2020-10-20 MED ORDER — BUPIVACAINE HCL (PF) 0.5 % IJ SOLN
INTRAMUSCULAR | Status: AC
  Filled 2020-10-20: qty 30

## 2020-10-20 MED ORDER — DIPHENHYDRAMINE HCL 12.5 MG/5ML PO ELIX: 12.5000 mg | ORAL_SOLUTION | ORAL | Status: DC | PRN

## 2020-10-20 MED ORDER — BUPIVACAINE HCL (PF) 0.25 % IJ SOLN
INTRAMUSCULAR | Status: DC | PRN
  Administered 2020-10-20: 28 mL

## 2020-10-20 MED ORDER — LIDOCAINE HCL (PF) 1 % IJ SOLN
INTRAMUSCULAR | Status: DC | PRN
  Administered 2020-10-20: 3 mL

## 2020-10-20 MED ORDER — DOCUSATE SODIUM 100 MG PO CAPS
100.0000 mg | ORAL_CAPSULE | Freq: Two times a day (BID) | ORAL | Status: DC
  Administered 2020-10-20 – 2020-10-21 (×3): 100 mg via ORAL
  Filled 2020-10-20 (×3): qty 1

## 2020-10-20 MED ORDER — MIDAZOLAM HCL 2 MG/2ML IJ SOLN
2.0000 mg | Freq: Once | INTRAMUSCULAR | Status: AC
  Administered 2020-10-20 (×2): .5 mg via INTRAVENOUS
  Administered 2020-10-20: 1 mg via INTRAVENOUS
  Administered 2020-10-20: 2 mg via INTRAVENOUS
  Filled 2020-10-20: qty 2

## 2020-10-20 MED ORDER — LACTATED RINGERS IV SOLN
INTRAVENOUS | Status: DC
  Administered 2020-10-20: 1000 mL via INTRAVENOUS

## 2020-10-20 MED ORDER — TRANEXAMIC ACID-NACL 1000-0.7 MG/100ML-% IV SOLN
1000.0000 mg | INTRAVENOUS | Status: AC
  Administered 2020-10-20: 1000 mg via INTRAVENOUS
  Filled 2020-10-20: qty 100

## 2020-10-20 MED ORDER — HYDROMORPHONE HCL 1 MG/ML IJ SOLN: 0.2500 mg | INTRAMUSCULAR | Status: DC | PRN

## 2020-10-20 MED ORDER — CELECOXIB 100 MG PO CAPS
200.0000 mg | ORAL_CAPSULE | Freq: Two times a day (BID) | ORAL | Status: DC
  Administered 2020-10-20 – 2020-10-21 (×3): 200 mg via ORAL
  Filled 2020-10-20 (×3): qty 2

## 2020-10-20 MED ORDER — FENTANYL CITRATE (PF) 100 MCG/2ML IJ SOLN
INTRAMUSCULAR | Status: DC | PRN
  Administered 2020-10-20 (×2): 25 ug via INTRAVENOUS
  Administered 2020-10-20 (×2): 50 ug via INTRAVENOUS
  Administered 2020-10-20 (×2): 25 ug via INTRAVENOUS

## 2020-10-20 MED ORDER — FENTANYL CITRATE (PF) 100 MCG/2ML IJ SOLN
INTRAMUSCULAR | Status: AC
  Filled 2020-10-20: qty 2

## 2020-10-20 MED ORDER — LIDOCAINE HCL (CARDIAC) PF 50 MG/5ML IV SOSY
PREFILLED_SYRINGE | INTRAVENOUS | Status: DC | PRN
  Administered 2020-10-20: 60 mg via INTRAVENOUS

## 2020-10-20 MED ORDER — NITROGLYCERIN 0.4 MG SL SUBL: 0.4000 mg | SUBLINGUAL_TABLET | SUBLINGUAL | Status: DC | PRN

## 2020-10-20 MED ORDER — ATORVASTATIN CALCIUM 40 MG PO TABS
80.0000 mg | ORAL_TABLET | Freq: Every day | ORAL | Status: DC
  Administered 2020-10-20: 80 mg via ORAL
  Filled 2020-10-20: qty 2

## 2020-10-20 MED ORDER — OMEGA-3-ACID ETHYL ESTERS 1 G PO CAPS
1000.0000 mg | ORAL_CAPSULE | Freq: Every day | ORAL | Status: DC
  Administered 2020-10-20 – 2020-10-21 (×2): 1000 mg via ORAL
  Filled 2020-10-20 (×2): qty 1

## 2020-10-20 MED ORDER — BUPIVACAINE-EPINEPHRINE 0.5% -1:200000 IJ SOLN
INTRAMUSCULAR | Status: DC | PRN
  Administered 2020-10-20: 20 mL

## 2020-10-20 MED ORDER — LOSARTAN POTASSIUM 50 MG PO TABS
50.0000 mg | ORAL_TABLET | Freq: Every day | ORAL | Status: DC
  Administered 2020-10-20 – 2020-10-21 (×2): 50 mg via ORAL
  Filled 2020-10-20 (×2): qty 1

## 2020-10-20 MED ORDER — SODIUM CHLORIDE 0.9 % IV SOLN: INTRAVENOUS | Status: AC

## 2020-10-20 MED ORDER — SODIUM CHLORIDE 0.9 % IR SOLN
Status: DC | PRN
  Administered 2020-10-20: 3000 mL
  Administered 2020-10-20: 1000 mL

## 2020-10-20 MED ORDER — BUPIVACAINE LIPOSOME 1.3 % IJ SUSP
INTRAMUSCULAR | Status: AC
  Filled 2020-10-20: qty 20

## 2020-10-20 MED ORDER — GLIPIZIDE ER 5 MG PO TB24
5.0000 mg | ORAL_TABLET | Freq: Every day | ORAL | Status: DC
  Administered 2020-10-21: 5 mg via ORAL
  Filled 2020-10-20: qty 1

## 2020-10-20 MED ORDER — CEFAZOLIN SODIUM-DEXTROSE 2-4 GM/100ML-% IV SOLN
2.0000 g | INTRAVENOUS | Status: AC
  Administered 2020-10-20: 2 g via INTRAVENOUS
  Filled 2020-10-20: qty 100

## 2020-10-20 MED ORDER — MENTHOL 3 MG MT LOZG: 1.0000 | LOZENGE | OROMUCOSAL | Status: DC | PRN

## 2020-10-20 MED ORDER — DEXAMETHASONE SODIUM PHOSPHATE 4 MG/ML IJ SOLN
INTRAMUSCULAR | Status: AC
  Filled 2020-10-20: qty 2

## 2020-10-20 MED ORDER — ONDANSETRON HCL 4 MG/2ML IJ SOLN
4.0000 mg | Freq: Once | INTRAMUSCULAR | Status: AC | PRN
  Administered 2020-10-20: 4 mg via INTRAVENOUS
  Filled 2020-10-20: qty 2

## 2020-10-20 MED ORDER — CHLORHEXIDINE GLUCONATE 0.12 % MT SOLN
OROMUCOSAL | Status: AC
  Filled 2020-10-20: qty 15

## 2020-10-20 MED ORDER — DEXAMETHASONE SODIUM PHOSPHATE 10 MG/ML IJ SOLN
INTRAMUSCULAR | Status: AC
  Filled 2020-10-20: qty 1

## 2020-10-20 MED ORDER — PHENOL 1.4 % MT LIQD: 1.0000 | OROMUCOSAL | Status: DC | PRN

## 2020-10-20 MED ORDER — BUPIVACAINE-EPINEPHRINE (PF) 0.5% -1:200000 IJ SOLN
INTRAMUSCULAR | Status: AC
  Filled 2020-10-20: qty 30

## 2020-10-20 MED ORDER — PROPOFOL 10 MG/ML IV BOLUS
INTRAVENOUS | Status: DC | PRN
  Administered 2020-10-20: 30 mg via INTRAVENOUS
  Administered 2020-10-20 (×2): 20 mg via INTRAVENOUS

## 2020-10-20 MED ORDER — PROPOFOL 10 MG/ML IV BOLUS
INTRAVENOUS | Status: AC
  Filled 2020-10-20: qty 20

## 2020-10-20 MED ORDER — TRANEXAMIC ACID-NACL 1000-0.7 MG/100ML-% IV SOLN
1000.0000 mg | Freq: Once | INTRAVENOUS | Status: AC
  Administered 2020-10-20: 1000 mg via INTRAVENOUS
  Filled 2020-10-20: qty 100

## 2020-10-20 MED ORDER — BUPIVACAINE LIPOSOME 1.3 % IJ SUSP
INTRAMUSCULAR | Status: DC | PRN
  Administered 2020-10-20: 20 mL

## 2020-10-20 MED ORDER — METFORMIN HCL ER 500 MG PO TB24
1000.0000 mg | ORAL_TABLET | Freq: Every day | ORAL | Status: DC
  Administered 2020-10-21: 1000 mg via ORAL
  Filled 2020-10-20: qty 2

## 2020-10-20 MED ORDER — POVIDONE-IODINE 10 % EX SWAB: 2.0000 "application " | Freq: Once | CUTANEOUS | Status: DC

## 2020-10-20 MED ORDER — CARVEDILOL 12.5 MG PO TABS
12.5000 mg | ORAL_TABLET | Freq: Two times a day (BID) | ORAL | Status: DC
  Administered 2020-10-20 – 2020-10-21 (×2): 12.5 mg via ORAL
  Filled 2020-10-20 (×3): qty 1

## 2020-10-20 MED ORDER — ACETAMINOPHEN 325 MG PO TABS: 325.0000 mg | ORAL_TABLET | Freq: Four times a day (QID) | ORAL | Status: DC | PRN

## 2020-10-20 MED ORDER — HYDROMORPHONE HCL 1 MG/ML IJ SOLN: 0.5000 mg | INTRAMUSCULAR | Status: DC | PRN

## 2020-10-20 MED ORDER — VITAMIN D 25 MCG (1000 UNIT) PO TABS
1000.0000 [IU] | ORAL_TABLET | Freq: Every day | ORAL | Status: DC
  Administered 2020-10-20 – 2020-10-21 (×2): 1000 [IU] via ORAL
  Filled 2020-10-20 (×2): qty 1

## 2020-10-20 MED ORDER — METHOCARBAMOL 500 MG PO TABS
500.0000 mg | ORAL_TABLET | Freq: Four times a day (QID) | ORAL | Status: DC | PRN
  Administered 2020-10-21: 500 mg via ORAL
  Filled 2020-10-20: qty 1

## 2020-10-20 MED ORDER — PROPOFOL 500 MG/50ML IV EMUL
INTRAVENOUS | Status: DC | PRN
  Administered 2020-10-20: 85 ug/kg/min via INTRAVENOUS
  Administered 2020-10-20: 75 ug/kg/min via INTRAVENOUS
  Administered 2020-10-20 (×2): 85 ug/kg/min via INTRAVENOUS
  Administered 2020-10-20: 75 ug/kg/min via INTRAVENOUS

## 2020-10-20 MED ORDER — ONDANSETRON HCL 4 MG/2ML IJ SOLN
INTRAMUSCULAR | Status: DC | PRN
  Administered 2020-10-20: 4 mg via INTRAVENOUS

## 2020-10-20 MED ORDER — METHOCARBAMOL 1000 MG/10ML IJ SOLN: 500.0000 mg | Freq: Four times a day (QID) | INTRAVENOUS | Status: DC | PRN

## 2020-10-20 MED ORDER — LIDOCAINE HCL (PF) 2 % IJ SOLN
INTRAMUSCULAR | Status: AC
  Filled 2020-10-20: qty 5

## 2020-10-20 MED ORDER — OXYCODONE HCL 5 MG PO TABS
5.0000 mg | ORAL_TABLET | ORAL | Status: DC | PRN
  Administered 2020-10-20 – 2020-10-21 (×5): 10 mg via ORAL
  Filled 2020-10-20 (×5): qty 2

## 2020-10-20 MED ORDER — INSULIN ASPART 100 UNIT/ML IJ SOLN
0.0000 [IU] | Freq: Three times a day (TID) | INTRAMUSCULAR | Status: DC
  Administered 2020-10-20: 2 [IU] via SUBCUTANEOUS
  Administered 2020-10-20: 5 [IU] via SUBCUTANEOUS
  Filled 2020-10-20: qty 0.09

## 2020-10-20 MED ORDER — LIDOCAINE HCL (PF) 1 % IJ SOLN
INTRAMUSCULAR | Status: AC
  Filled 2020-10-20: qty 30

## 2020-10-20 MED ORDER — ASPIRIN 81 MG PO CHEW
81.0000 mg | CHEWABLE_TABLET | Freq: Two times a day (BID) | ORAL | Status: DC
  Administered 2020-10-20 – 2020-10-21 (×2): 81 mg via ORAL
  Filled 2020-10-20 (×2): qty 1

## 2020-10-20 SURGICAL SUPPLY — 68 items
BANDAGE ESMARK 6X9 LF (GAUZE/BANDAGES/DRESSINGS) ×1 IMPLANT
BNDG CMPR 9X6 STRL LF SNTH (GAUZE/BANDAGES/DRESSINGS) ×1
BNDG CMPR STD VLCR NS LF 5.8X4 (GAUZE/BANDAGES/DRESSINGS) ×2
BNDG CMPR STD VLCR NS LF 5.8X6 (GAUZE/BANDAGES/DRESSINGS) ×1
BNDG ELASTIC 4X5.8 VLCR NS LF (GAUZE/BANDAGES/DRESSINGS) ×4 IMPLANT
BNDG ELASTIC 6X5.8 VLCR NS LF (GAUZE/BANDAGES/DRESSINGS) ×2 IMPLANT
BNDG ESMARK 6X9 LF (GAUZE/BANDAGES/DRESSINGS) ×2
CEMENT HV SMART SET (Cement) ×4 IMPLANT
CLOTH BEACON ORANGE TIMEOUT ST (SAFETY) ×2 IMPLANT
COMPONENT TIBIAL PFC SIZE 3 (Knees) IMPLANT
COOLER ICEMAN CLASSIC (MISCELLANEOUS) ×2 IMPLANT
COVER LIGHT HANDLE STERIS (MISCELLANEOUS) ×4 IMPLANT
CUFF TOURN SGL QUICK 34 (TOURNIQUET CUFF) ×2
CUFF TRNQT CYL 34X4.125X (TOURNIQUET CUFF) ×1 IMPLANT
DECANTER SPIKE VIAL GLASS SM (MISCELLANEOUS) ×1 IMPLANT
DRAPE BACK TABLE (DRAPES) ×2 IMPLANT
DRAPE EXTREMITY T 121X128X90 (DISPOSABLE) ×2 IMPLANT
DRSG MEPILEX BORDER 4X12 (GAUZE/BANDAGES/DRESSINGS) ×2 IMPLANT
DURAPREP 26ML APPLICATOR (WOUND CARE) ×4 IMPLANT
ELECT REM PT RETURN 9FT ADLT (ELECTROSURGICAL) ×2
ELECTRODE REM PT RTRN 9FT ADLT (ELECTROSURGICAL) ×1 IMPLANT
GLOVE SKINSENSE NS SZ8.0 LF (GLOVE) ×1
GLOVE SKINSENSE STRL SZ8.0 LF (GLOVE) ×1 IMPLANT
GLOVE SS N UNI LF 8.5 STRL (GLOVE) ×2 IMPLANT
GLOVE SURG UNDER POLY LF SZ7 (GLOVE) ×10 IMPLANT
GOWN STRL REUS W/TWL LRG LVL3 (GOWN DISPOSABLE) ×6 IMPLANT
GOWN STRL REUS W/TWL XL LVL3 (GOWN DISPOSABLE) ×2 IMPLANT
HANDPIECE INTERPULSE COAX TIP (DISPOSABLE) ×2
HOOD W/PEELAWAY (MISCELLANEOUS) ×8 IMPLANT
INSERT STABILIZED 10MM (Knees) ×1 IMPLANT
INST SET MAJOR BONE (KITS) ×2 IMPLANT
IV NS IRRIG 3000ML ARTHROMATIC (IV SOLUTION) ×2 IMPLANT
KIT TURNOVER KIT A (KITS) ×2 IMPLANT
MANIFOLD NEPTUNE II (INSTRUMENTS) ×2 IMPLANT
MARKER SKIN DUAL TIP RULER LAB (MISCELLANEOUS) ×2 IMPLANT
NDL HYPO 18GX1.5 BLUNT FILL (NEEDLE) ×1 IMPLANT
NDL HYPO 21X1.5 SAFETY (NEEDLE) ×1 IMPLANT
NEEDLE HYPO 18GX1.5 BLUNT FILL (NEEDLE) ×2 IMPLANT
NEEDLE HYPO 21X1.5 SAFETY (NEEDLE) ×4 IMPLANT
NS IRRIG 1000ML POUR BTL (IV SOLUTION) ×2 IMPLANT
PACK TOTAL JOINT (CUSTOM PROCEDURE TRAY) ×2 IMPLANT
PAD ARMBOARD 7.5X6 YLW CONV (MISCELLANEOUS) ×3 IMPLANT
PAD COLD SHLDR WRAP-ON (PAD) ×2 IMPLANT
PENCIL SMOKE EVACUATOR (MISCELLANEOUS) ×2 IMPLANT
PILLOW KNEE EXTENSION 0 DEG (MISCELLANEOUS) ×2 IMPLANT
SAW OSC TIP CART 19.5X105X1.3 (SAW) ×1 IMPLANT
SET BASIN LINEN APH (SET/KITS/TRAYS/PACK) ×2 IMPLANT
SET HNDPC FAN SPRY TIP SCT (DISPOSABLE) ×1 IMPLANT
SPONGE T-LAP 18X18 ~~LOC~~+RFID (SPONGE) ×3 IMPLANT
STAPLER VISISTAT 35W (STAPLE) ×2 IMPLANT
STEM TIBIA PFC 13X30MM (Stem) ×1 IMPLANT
SUT BRALON NAB BRD #1 30IN (SUTURE) ×5 IMPLANT
SUT MNCRL 0 VIOLET CTX 36 (SUTURE) ×1 IMPLANT
SUT MON AB 2-0 CT1 36 (SUTURE) ×1 IMPLANT
SUT MONOCRYL 0 CTX 36 (SUTURE) ×4
SUT VIC AB 1 CT1 27 (SUTURE) ×4
SUT VIC AB 1 CT1 27XBRD ANTBC (SUTURE) IMPLANT
SYR 20ML LL LF (SYRINGE) ×3 IMPLANT
SYR 30ML LL (SYRINGE) ×2 IMPLANT
SYR BULB IRRIG 60ML STRL (SYRINGE) ×2 IMPLANT
TIBIAL  PFC SIZE 3 (Knees) ×2 IMPLANT
TIBIAL PFC SIZE 3 (Knees) ×1 IMPLANT
TOWEL OR 17X26 4PK STRL BLUE (TOWEL DISPOSABLE) ×3 IMPLANT
TOWER CARTRIDGE SMART MIX (DISPOSABLE) ×2 IMPLANT
TRAY FOLEY W/BAG SLVR 16FR (SET/KITS/TRAYS/PACK) ×2
TRAY FOLEY W/BAG SLVR 16FR ST (SET/KITS/TRAYS/PACK) ×1 IMPLANT
WATER STERILE IRR 1000ML POUR (IV SOLUTION) ×4 IMPLANT
YANKAUER SUCT 12FT TUBE ARGYLE (SUCTIONS) ×2 IMPLANT

## 2020-10-20 NOTE — Anesthesia Procedure Notes (Signed)
Anesthesia Regional Block: Adductor canal block   Pre-Anesthetic Checklist: , timeout performed,  Correct Patient, Correct Site, Correct Laterality,  Correct Procedure, Correct Position, site marked,  Risks and benefits discussed,  At surgeon's request and post-op pain management  Laterality: Left  Prep: Betadine, chloraprep       Needles:  Injection technique: Single-shot  Needle Type: Stimulator Needle - 80     Needle Length: 10cm  Needle Gauge: 20   Needle insertion depth: 7 cm   Additional Needles:   Procedures:, nerve stimulator,,, ultrasound used (permanent image in chart),,     Nerve Stimulator or Paresthesia:  Response: no Twitch elicited, 0.5 mA, 7 cm  Additional Responses:   Narrative:  Start time: 10/20/2020 7:20 AM End time: 10/20/2020 7:30 AM Injection made incrementally with aspirations every 5 mL.  Performed by: Personally  Anesthesiologist: Denese Killings, MD  Additional Notes: BP cuff, EKG monitors applied. Sedation begun. After nerve location anesthetic injected incrementally, slowly , and after neg aspirations. Tolerated well.

## 2020-10-20 NOTE — Brief Op Note (Signed)
10/20/2020  10:33 AM  PATIENT:  [name]  50 y.o. female  PRE-OPERATIVE DIAGNOSIS:  loosening left total knee replacement  POST-OPERATIVE DIAGNOSIS:  loosening left total knee replacement  PROCEDURE:  Procedure(s): REVISION TIBIAL COMPONENT LEFT TOTAL KNEE (Left)  DePuy Sigma size 3 base plate and 10 mm PS poly with 30 mm stem cemented   Assist Fulton Mole and [facility]  Anesthesia preop saphenous nerve block and spinal anesthetic  Findings the tibial tray was in varus and was easily removed from the cement mantle indicating micromotion  SURGEON:  Surgeon(s) and Role:    Carole Civil, MD - Primary  PHYSICIAN ASSISTANT:   EBL:  20 mL   BLOOD ADMINISTERED:none  DRAINS: none   LOCAL MEDICATIONS USED:  MARCAINE with epi 20 cc and OTHER exparel 20 full-strength  SPECIMEN:  No Specimen  DISPOSITION OF SPECIMEN:  N/A  COUNTS:  YES  TOURNIQUET:   Total Tourniquet Time Documented: Thigh (Left) - 115 minutes Total: Thigh (Left) - 115 minutes   DICTATION: .Viviann Spare Dictation  PLAN OF CARE: Admit to inpatient   PATIENT DISPOSITION:  PACU - hemodynamically stable.   Delay start of Pharmacological VTE agent (>24hrs) due to surgical blood loss or risk of bleeding: yes

## 2020-10-20 NOTE — Anesthesia Procedure Notes (Signed)
Date/Time: 10/20/2020 7:45 AM Performed by: Vista Deck, CRNA Pre-anesthesia Checklist: Patient identified, Emergency Drugs available, Suction available, Timeout performed and Patient being monitored Patient Re-evaluated:Patient Re-evaluated prior to induction Oxygen Delivery Method: Nasal Cannula

## 2020-10-20 NOTE — Anesthesia Postprocedure Evaluation (Signed)
Anesthesia Post Note  Patient: [name]  Procedure(s) Performed: REVISION TIBIAL COMPONENT LEFT TOTAL KNEE (Left: Knee)  Patient location during evaluation: PACU Anesthesia Type: Combined General/Spinal Level of consciousness: awake and alert and oriented Pain management: pain level controlled Vital Signs Assessment: post-procedure vital signs reviewed and stable Respiratory status: spontaneous breathing and respiratory function stable Cardiovascular status: blood pressure returned to baseline and stable Postop Assessment: no apparent nausea or vomiting Anesthetic complications: no   No notable events documented.   Last Vitals:  Vitals:   10/20/20 1145 10/20/20 1304  BP: 117/68 (!) 120/91  Pulse: 65 74  Resp: 14 18  Temp:  36.5 C  SpO2: 97% 97%    Last Pain:  Vitals:   10/20/20 1304  TempSrc: Oral  PainSc:                  Tsering Leaman C Jonnie Truxillo

## 2020-10-20 NOTE — Evaluation (Signed)
Physical Therapy Evaluation Patient Details Name: [name] MRN: [id number] DOB: May 12, 1970 Today's Date: 10/20/2020   History of Present Illness  Rosaura Carpenter, 50 y.o. female s/p revision tibial component L TKA. PMH: CAD, diabetes, HTN, MI, L TKA 2019  Clinical Impression  Pt admitted with above diagnosis. Pt independent at baseline, has had pain in L knee since TKA in 2019. Pt currently requiring min A with mobility for LLE assistance, limited to 3 sidesteps at EOB with RW and L KI on. Pt limited by pain and nausea, assisted back to supine and notified RN of pt's complaints. Pt will have significant other there to assist 24/7 and goal to return to OPPT for therapy. Pt currently with functional limitations due to the deficits listed below (see PT Problem List). Pt will benefit from skilled PT to increase their independence and safety with mobility to allow discharge to the venue listed below.       Follow Up Recommendations Outpatient PT;Supervision for mobility/OOB    Equipment Recommendations  None recommended by PT    Recommendations for Other Services       Precautions / Restrictions Precautions Precautions: Fall;Knee Required Braces or Orthoses: Knee Immobilizer - Left Knee Immobilizer - Left: On when out of bed or walking Restrictions Weight Bearing Restrictions: No      Mobility  Bed Mobility Overal bed mobility: Needs Assistance Bed Mobility: Supine to Sit;Sit to Supine  Supine to sit: Min assist Sit to supine: Min assist   General bed mobility comments: min A for LLE management for supine<>sit    Transfers Overall transfer level: Needs assistance Equipment used: Rolling walker (2 wheeled) Transfers: Sit to/from Stand Sit to Stand: Min assist;From elevated surface    General transfer comment: min A to steady with power to stand at EOB, L KI on  Ambulation/Gait Ambulation/Gait assistance: Min assist  Assistive device: Rolling walker (2 wheeled) Gait  Pattern/deviations: Step-to pattern;Decreased stride length Gait velocity: decreased  General Gait Details: limited to 3 sidesteps up to [facility] with RW, limited by nausea/pain  Stairs            Wheelchair Mobility    Modified Rankin (Stroke Patients Only)       Balance Overall balance assessment: Needs assistance Sitting-balance support: Feet supported Sitting balance-Leahy Scale: Fair Sitting balance - Comments: seated EOB   Standing balance support: During functional activity;Bilateral upper extremity supported Standing balance-Leahy Scale: Poor Standing balance comment: reliant on RW        Pertinent Vitals/Pain Pain Assessment: 0-10 Pain Score: 4  Pain Location: L knee Pain Descriptors / Indicators: Aching;Sore Pain Intervention(s): Limited activity within patient's tolerance;Monitored during session;Premedicated before session;Repositioned;Ice applied;Patient requesting pain meds-RN notified    Home Living Family/patient expects to be discharged to:: Private residence Living Arrangements: Spouse/significant other Available Help at Discharge: Family;Available 24 hours/day Type of Home: House Home Access: Stairs to enter   [employer] of Steps: 1 Home Layout: One level Home Equipment: Walker - 2 wheels;Cane - single point;Crutches;Bedside commode;Shower seat      Prior Function Level of Independence: Independent  Comments: Pt independent, pain since L TKA in 2019     Hand Dominance        Extremity/Trunk Assessment   Upper Extremity Assessment Upper Extremity Assessment: Overall WFL for tasks assessed    Lower Extremity Assessment Lower Extremity Assessment: LLE deficits/detail LLE Deficits / Details: ankle ankle pump WNL, quad set, unable to perform SLR, hip abd/add in supine and standing WNL,  denies numbness/tingling throughout leg but states numbness in buttock    Cervical / Trunk Assessment Cervical / Trunk Assessment: Normal   Communication   Communication: No difficulties  Cognition Arousal/Alertness: Awake/alert Behavior During Therapy: WFL for tasks assessed/performed Overall Cognitive Status: Within Functional Limits for tasks assessed       General Comments      Exercises     Assessment/Plan    PT Assessment Patient needs continued PT services  PT Problem List Decreased strength;Decreased range of motion;Decreased activity tolerance;Decreased balance;Decreased mobility;Impaired sensation;Pain       PT Treatment Interventions DME instruction;Gait training;Stair training;Functional mobility training;Therapeutic activities;Therapeutic exercise;Balance training;Neuromuscular re-education;Patient/family education    PT Goals (Current goals can be found in the Care Plan section)  Acute Rehab PT Goals Patient Stated Goal: OPPT PT Goal Formulation: With patient/family Time For Goal Achievement: 11/03/20 Potential to Achieve Goals: Good    Frequency Min 5X/week   Barriers to discharge        Co-evaluation               AM-PAC PT "6 Clicks" Mobility  Outcome Measure Help needed turning from your back to your side while in a flat bed without using bedrails?: A Little Help needed moving from lying on your back to sitting on the side of a flat bed without using bedrails?: A Little Help needed moving to and from a bed to a chair (including a wheelchair)?: A Little Help needed standing up from a chair using your arms (e.g., wheelchair or bedside chair)?: A Little Help needed to walk in hospital room?: A Little Help needed climbing 3-5 steps with a railing? : A Lot 6 Click Score: 17    End of Session Equipment Utilized During Treatment: Gait belt;Left knee immobilizer Activity Tolerance: Patient tolerated treatment well;Patient limited by pain;Other (comment) (limited by nausea) Patient left: in bed;with call bell/phone within reach;with bed alarm set;with family/visitor present Nurse  Communication: Mobility status;Patient requests pain meds PT Visit Diagnosis: Other abnormalities of gait and mobility (R26.89);Difficulty in walking, not elsewhere classified (R26.2);Pain Pain - Right/Left: Left Pain - part of body: Knee    Time: 0623-7628 PT Time Calculation (min) (ACUTE ONLY): 24 min   Charges:   PT Evaluation $PT Eval Low Complexity: 1 Low PT Treatments $Therapeutic Activity: 8-22 mins         Tori Nance Mccombs PT, DPT 10/20/20, 3:48 PM

## 2020-10-20 NOTE — Interval H&P Note (Signed)
History and Physical Interval Note:  10/20/2020 7:42 AM  [name]  has presented today for surgery, with the diagnosis of loosening left total knee replacement.  The various methods of treatment have been discussed with the patient and family. After consideration of risks, benefits and other options for treatment, the patient has consented to  Procedure(s): [place] (Left) as a surgical intervention.  The patient's history has been reviewed, patient examined, no change in status, stable for surgery.  I have reviewed the patient's chart and labs.  Questions were answered to the patient's satisfaction.     Arther Abbott

## 2020-10-20 NOTE — Plan of Care (Signed)
  Problem: Acute Rehab PT Goals(only PT should resolve) Goal: Pt Will Go Supine/Side To Sit Outcome: Progressing Flowsheets (Taken 10/20/2020 1551) Pt will go Supine/Side to Sit: with min guard assist Goal: Pt Will Go Sit To Supine/Side Outcome: Progressing Flowsheets (Taken 10/20/2020 1551) Pt will go Sit to Supine/Side: with min guard assist Goal: Patient Will Transfer Sit To/From Stand Outcome: Progressing Flowsheets (Taken 10/20/2020 1551) Patient will transfer sit to/from stand: with min guard assist Goal: Pt Will Transfer Bed To Chair/Chair To Bed Outcome: Progressing Flowsheets (Taken 10/20/2020 1551) Pt will Transfer Bed to Chair/Chair to Bed: min guard assist Goal: Pt Will Ambulate Outcome: Progressing Flowsheets (Taken 10/20/2020 1551) Pt will Ambulate:  50 feet  with min guard assist  with rolling walker   Tori Shane Badeaux PT, DPT 10/20/20, 3:51 PM

## 2020-10-20 NOTE — Progress Notes (Signed)
Patient ID: [name], female   DOB: [birth year], 50 y.o.   MRN: [id number]  POST OP:  Doing well, c/o burning at the incision;  Overall pain well controlled.  She s neurovascular intact

## 2020-10-20 NOTE — Op Note (Signed)
10/20/2020   10:33 AM  PATIENT:  [name]  50 y.o. female  PRE-OPERATIVE DIAGNOSIS:  loosening left total knee replacement  POST-OPERATIVE DIAGNOSIS:  loosening left total knee replacement  PROCEDURE:  Procedure(s): REVISION TIBIAL COMPONENT LEFT TOTAL KNEE (Left)  De[employer] Sigma size 3 base plate and 10 mm PS poly with 30 mm stem cemented   Assist [facility]  Anesthesia preop saphenous nerve block and spinal anesthetic  Findings the tibial tray was in varus and was easily removed from the cement mantle indicating micromotion  SURGEON:  Surgeon(s) and Role:    Carole Civil, MD - Primary  PHYSICIAN ASSISTANT:   EBL:  20 mL   BLOOD ADMINISTERED:none  DRAINS: none   LOCAL MEDICATIONS USED:  MARCAINE with epi 20 cc and OTHER exparel 20 full-strength  SPECIMEN:  No Specimen  DISPOSITION OF SPECIMEN:  N/A  COUNTS:  YES  TOURNIQUET:   Total Tourniquet Time Documented: Thigh (Left) - 115 minutes Total: Thigh (Left) - 115 minutes   DICTATION: .Dragon Dictation  50 year old female with painful tibial implant bone scan showed probable loosening of the implant infectious work-up negative.  Patient opted to have surgical revision versus continue with pain  Patient was seen in preop surgical site confirmed and marked implants and instruments checked and available  Patient given a preop saphenous nerve block and then taken to the operating room where she had a spinal anesthetic followed by insertion of a Foley catheter  After sterile prep and drape and timeout the limb was exsanguinated with a six-inch Esmarch the tourniquet was elevated to 280 mmHg.  The previous midline incision was used to open the skin it was extended proximally slightly.  After establishing the tissue planes again a medial arthrotomy was performed but the patella could not be everted  After elevation of the soft tissue sleeve and release of the proximal adhesions of the  patellar tendon to the tibia the patella could not be everted and therefore a quadriceps snip was performed at a 45 degree angle laterally marking the edges with a #1 Vicryl  After doing this we were able to evert the patella and then we released the patellofemoral ligament and got good excursion of the patella  The cement mantle was broken with osteotome until it was easy to remove the implant the tibia was subluxated forward and the implant was removed  Cement was then removed with various osteotomes.  We then irrigated the joint removed any pieces of cement and then did a cleanup cut with a slight angle towards valgus to neutral position  The tibial tray size 3 match the size 4 narrow femoral component and we could not go down to a size making slight overhang posteriorly  However this was definitely more advantageous than trying to revise the femur as well.  We did sequential reamings by hand up to a 15 we did a trial with the 3 trial baseplate tried a 61.6 and a 10 and the 10 seem to fit better  As far stability goes the knee came to full extension and at 90 degrees of flexion we had no subluxation of the tibia  After irrigating the knee again we placed the real implant which was a size 3 with a 30 mm stem trialed again with a 10 and 12.5 insert the 12.5 was definitely the too tight  After the cement dried we remove the excess cement and then placed a 10 polyethylene insert.  Final range of motion check was 0 degrees extension 120 degrees of flexion stable in extension with slight opening on each side and stable anterior posterior drawer test at 90 degrees  The knee was irrigated we injected the Marcaine with epinephrine in the exparel we closed the quadriceps snip with #1 Vicryl we closed the quadriceps and extensor mechanism with #1 Braylon in interrupted fashion and then used a running 0 Monocryl to close the skin closing the skin edges with staples  Sterile dressing was applied  tourniquet was released the leg had good viability  Cryo/Cuff was applied after dressing and it was activated  Patient was taken to recovery room in stable condition  Postop plan weight-bear as tolerated with knee immobilizer.  Otherwise routine total knee protocol  PLAN OF CARE: Admit to inpatient   PATIENT DISPOSITION:  PACU - hemodynamically stable.   Delay start of Pharmacological VTE agent (>24hrs) due to surgical blood loss or risk of bleeding: yes

## 2020-10-20 NOTE — Anesthesia Preprocedure Evaluation (Signed)
Anesthesia Evaluation  Patient identified by MRN, date of birth, ID band Patient awake    Reviewed: Allergy & Precautions, NPO status , Patient's Chart, lab work & pertinent test results, reviewed documented beta blocker date and time   History of Anesthesia Complications (+) PONV and history of anesthetic complications  Airway Mallampati: III  TM Distance: >3 FB Neck ROM: Full    Dental  (+) Dental Advisory Given   Pulmonary Current Smoker and Patient abstained from smoking.,    Pulmonary exam normal breath sounds clear to auscultation       Cardiovascular Exercise Tolerance: Good hypertension, Pt. on medications and Pt. on home beta blockers + CAD, + Past MI and + Cardiac Stents  Normal cardiovascular exam Rhythm:Regular Rate:Normal  08-Apr-2020 06:36:08 [facility] ROUTINE RECORD Sinus bradycardia Otherwise normal ECG   Neuro/Psych  Neuromuscular disease (sciatica, ulnar nerve compression) negative psych ROS   GI/Hepatic negative GI ROS, Neg liver ROS, Partial colectomy   Endo/Other  diabetes, Well Controlled, Type 2, Oral Hypoglycemic Agents  Renal/GU      Musculoskeletal  (+) Arthritis  (back pain, sciatica, left foot pain),   Abdominal   Peds  Hematology   Anesthesia Other Findings   Reproductive/Obstetrics                           Anesthesia Physical Anesthesia Plan  ASA: 3  Anesthesia Plan: General/Spinal   Post-op Pain Management:  Regional for Post-op pain   Induction: Intravenous  PONV Risk Score and Plan: 4 or greater and Ondansetron, Dexamethasone and Midazolam  Airway Management Planned: Nasal Cannula, Natural Airway and Simple Face Mask  Additional Equipment:   Intra-op Plan:   Post-operative Plan:   Informed Consent: I have reviewed the patients History and Physical, chart, labs and discussed the procedure including the risks, benefits and  alternatives for the proposed anesthesia with the patient or authorized representative who has indicated his/her understanding and acceptance.     Dental advisory given  Plan Discussed with: CRNA and Surgeon  Anesthesia Plan Comments:         Anesthesia Quick Evaluation

## 2020-10-20 NOTE — Transfer of Care (Signed)
Immediate Anesthesia Transfer of Care Note  Patient: [name]  Procedure(s) Performed: REVISION TIBIAL COMPONENT LEFT TOTAL KNEE (Left: Knee)  Patient Location: PACU  Anesthesia Type:General and Spinal  Level of Consciousness: awake and patient cooperative  Airway & Oxygen Therapy: Patient Spontanous Breathing and Patient connected to nasal cannula oxygen  Post-op Assessment: Report given to RN and Post -op Vital signs reviewed and stable  Post vital signs: Reviewed and stable  Last Vitals:  Vitals Value Taken Time  BP 112/72 10/20/20 1036  Temp 98   Pulse 72 10/20/20 1038  Resp 16 10/20/20 1038  SpO2 97 % 10/20/20 1038  Vitals shown include unvalidated device data.  Last Pain:  Vitals:   10/20/20 0706  TempSrc: Oral  PainSc: 3       Patients Stated Pain Goal: 7 (75/44/92 0100)  Complications: No notable events documented.

## 2020-10-21 ENCOUNTER — Encounter (HOSPITAL_COMMUNITY): Payer: Self-pay | Admitting: Orthopedic Surgery

## 2020-10-21 LAB — CBC
HCT: 39.6 % (ref 36.0–46.0)
Hemoglobin: 13 g/dL (ref 12.0–15.0)
MCH: 30 pg (ref 26.0–34.0)
MCHC: 32.8 g/dL (ref 30.0–36.0)
MCV: 91.5 fL (ref 80.0–100.0)
Platelets: 303 10*3/uL (ref 150–400)
RBC: 4.33 MIL/uL (ref 3.87–5.11)
RDW: 13.7 % (ref 11.5–15.5)
WBC: 18.2 10*3/uL — ABNORMAL HIGH (ref 4.0–10.5)
nRBC: 0 % (ref 0.0–0.2)

## 2020-10-21 LAB — BASIC METABOLIC PANEL
Anion gap: 7 (ref 5–15)
BUN: 20 mg/dL (ref 6–20)
CO2: 23 mmol/L (ref 22–32)
Calcium: 8.4 mg/dL — ABNORMAL LOW (ref 8.9–10.3)
Chloride: 105 mmol/L (ref 98–111)
Creatinine, Ser: 0.59 mg/dL (ref 0.44–1.00)
GFR, Estimated: >60 mL/min (ref >60)
Glucose, Bld: 197 mg/dL — ABNORMAL HIGH (ref 70–99)
Potassium: 3.9 mmol/L (ref 3.5–5.1)
Sodium: 135 mmol/L (ref 135–145)

## 2020-10-21 LAB — GLUCOSE, CAPILLARY: Glucose-Capillary: 229 mg/dL — ABNORMAL HIGH (ref 70–99)

## 2020-10-21 MED ORDER — TRAMADOL HCL 50 MG PO TABS: 50.0000 mg | ORAL_TABLET | Freq: Four times a day (QID) | ORAL | 0 refills | Status: DC

## 2020-10-21 MED ORDER — OXYCODONE-ACETAMINOPHEN 10-325 MG PO TABS: 1.0000 | ORAL_TABLET | ORAL | 0 refills | Status: AC | PRN

## 2020-10-21 MED ORDER — DOCUSATE SODIUM 100 MG PO CAPS: 100.0000 mg | ORAL_CAPSULE | Freq: Two times a day (BID) | ORAL | 0 refills | Status: DC

## 2020-10-21 MED ORDER — CELECOXIB 200 MG PO CAPS: 200.0000 mg | ORAL_CAPSULE | Freq: Two times a day (BID) | ORAL | 1 refills | Status: AC

## 2020-10-21 MED ORDER — METHOCARBAMOL 500 MG PO TABS: 500.0000 mg | ORAL_TABLET | Freq: Four times a day (QID) | ORAL | 1 refills | Status: AC | PRN

## 2020-10-21 MED ORDER — ASPIRIN 81 MG PO CHEW: 81.0000 mg | CHEWABLE_TABLET | Freq: Two times a day (BID) | ORAL | 0 refills | Status: AC

## 2020-10-21 MED ORDER — PROMETHAZINE HCL 12.5 MG PO TABS: 12.5000 mg | ORAL_TABLET | ORAL | 5 refills | Status: DC | PRN

## 2020-10-21 NOTE — Progress Notes (Signed)
Physical Therapy Treatment Patient Details Name: [name] MRN: [id number] DOB: [birth year] Today's Date: 10/21/2020    History of Present Illness [name], 50 y.o. female s/p revision tibial component L TKA. PMH: CAD, diabetes, HTN, MI, L TKA 2019    PT Comments    Patient demonstrates increased endurance/distance for gait training without loss of balance and for going up/down 1 step using one side rail while wearing left knee immobilizer with good return demonstrated and understanding acknowledged.  Patient will benefit from continued physical therapy in hospital and recommended venue below to increase strength, balance, endurance for safe ADLs and gait.    Follow Up Recommendations  Outpatient PT;Supervision for mobility/OOB     Equipment Recommendations  None recommended by PT    Recommendations for Other Services       Precautions / Restrictions Precautions Precautions: Fall Required Braces or Orthoses: Knee Immobilizer - Left Knee Immobilizer - Left: On when out of bed or walking Restrictions Weight Bearing Restrictions: No    Mobility  Bed Mobility Overal bed mobility: Modified Independent                  Transfers Overall transfer level: Modified independent   Transfers: Sit to/from [employer] Transfers Sit to Stand: Modified independent (Device/Increase time) Stand pivot transfers: Modified independent (Device/Increase time)       General transfer comment: demonstrates good return for completing sit to stands and transfers without assistance  Ambulation/Gait Ambulation/Gait assistance: Modified independent (Device/Increase time) Gait Distance (Feet): 120 Feet Assistive device: Rolling walker (2 wheeled) Gait Pattern/deviations: Decreased step length - left;Decreased stride length Gait velocity: decreased   General Gait Details: demonstrates increased endurance/distance for gait training without loss of balance with left knee pain  at baseline   Stairs Stairs: Yes Stairs assistance: Supervision Stair Management: One rail Right;One rail Left;Step to pattern Number of Stairs: 1 General stair comments: demonstrates good return for going up/down 1 step while wearing left knee immobilizer   Wheelchair Mobility    Modified Rankin (Stroke Patients Only)       Balance Overall balance assessment: Needs assistance Sitting-balance support: Feet supported;No upper extremity supported Sitting balance-Leahy Scale: Good Sitting balance - Comments: seated EOB   Standing balance support: During functional activity;Bilateral upper extremity supported Standing balance-Leahy Scale: Fair Standing balance comment: fair/good using RW                            Cognition Arousal/Alertness: Awake/alert Behavior During Therapy: WFL for tasks assessed/performed Overall Cognitive Status: Within Functional Limits for tasks assessed                                        Exercises      General Comments        Pertinent Vitals/Pain Pain Assessment: Faces Faces Pain Scale: Hurts a little bit Pain Location: L knee Pain Descriptors / Indicators: Aching;Sore Pain Intervention(s): Limited activity within patient's tolerance;Monitored during session    Home Living                      Prior Function            PT Goals (current goals can now be found in the care plan section) Acute Rehab PT Goals Patient Stated Goal: increase left knee strength for walking PT Goal  Formulation: With patient/family Time For Goal Achievement: 11/03/20 Potential to Achieve Goals: Good Progress towards PT goals: Progressing toward goals    Frequency    Min 5X/week      PT Plan Current plan remains appropriate    Co-evaluation              AM-PAC PT "6 Clicks" Mobility   Outcome Measure  Help needed turning from your back to your side while in a flat bed without using bedrails?:  None Help needed moving from lying on your back to sitting on the side of a flat bed without using bedrails?: None Help needed moving to and from a bed to a chair (including a wheelchair)?: None Help needed standing up from a chair using your arms (e.g., wheelchair or bedside chair)?: None Help needed to walk in hospital room?: None Help needed climbing 3-5 steps with a railing? : A Little 6 Click Score: 23    End of Session   Activity Tolerance: Patient tolerated treatment well Patient left: in bed;with call bell/phone within reach Nurse Communication: Mobility status PT Visit Diagnosis: Other abnormalities of gait and mobility (R26.89);Difficulty in walking, not elsewhere classified (R26.2);Pain Pain - Right/Left: Left Pain - part of body: Knee     Time: 9179-1505 PT Time Calculation (min) (ACUTE ONLY): 26 min  Charges:  $Gait Training: 8-22 mins $Therapeutic Activity: 8-22 mins                     11:33 AM, 10/21/20 Lonell Grandchild, MPT Physical Therapist with [facility] [phone] office [phone] mobile phone

## 2020-10-21 NOTE — Final Progress Note (Signed)
Patient discharged via w/c to personal vehicle . IV removed without incident. Medication instructions went over with patient in discharge summary , personal belongings sent with patient.

## 2020-10-21 NOTE — Progress Notes (Signed)
When this nurse entered room, the patient asked about removing foley. This nurse told patient it was scheduled to be removed 6/29 at 0600 but patient stated she felt that she could get up to [facility]. Foley was removed and patient got up to [facility] with this nurse's assistance and voided. Patient reminded to call for assistance when getting OOB, patient verbalized understanding. Will continue to monitor.

## 2020-10-21 NOTE — Discharge Summary (Signed)
Physician Discharge Summary  Patient ID: [name] MRN: [id number] DOB/AGE: 1970-09-06 50 y.o.  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  She had surgery with spinal anesthesia and preop's saphenous nerve block.  She tolerated this procedure well  On postop day 24 September 2027 she was awake alert and oriented x3 mood and affect were normal and she did not have a fever  She tolerated physical therapy and was discharged home  BP (!) 114/57 (BP Location: Left Arm)   Pulse (!) 53   Temp 98.2 F (36.8 C) (Oral)   Resp (!) 21   Ht 5\' 5"  (1.651 m)   Wt 210 lb 1.6 oz (95.3 kg)   SpO2 98%   BMI 34.96 kg/m    Disposition: Discharge disposition: 01-Home or Self Care      Discharge Instructions     Call MD / Call 911   Complete by: As directed    If you experience chest pain or shortness of breath, CALL 911 and be transported to the hospital emergency room.  If you develope a fever above 101 F, pus (white drainage) or increased drainage or redness at the wound, or calf pain, call your surgeon's office.   Constipation Prevention   Complete by: As directed    Drink plenty of fluids.  Prune juice may be helpful.  You may use a stool softener, such as Colace (over the counter) 100 mg twice a day.  Use MiraLax (over the counter) for constipation as needed.   Diet - low sodium heart healthy   Complete by: As directed    Discharge instructions   Complete by: As directed    Ice is your friend ice is much as possible with the Cryo/Cuff  Make sure you do your home exercises as instructed  You can change the dressing to a Mepilex dressing with the TED hose after 48  hours  Weight-bear as tolerated in the knee brace  Use the CPM for 6 hours/day divided as you see fit, start at 60 degrees increase 10 degrees/day as tolerated up to 120 degrees for 2 weeks and then call the company to pick the machine up  No showers no baths, bathe with washcloth  No driving for 2 weeks, when you start driving make sure you can bend the knee 90 degrees to get into the car easier   Increase activity slowly as tolerated   Complete by: As directed    Post-operative opioid taper instructions:   Complete by: As directed    POST-OPERATIVE OPIOID TAPER INSTRUCTIONS: It is important to wean off of your opioid medication as soon as possible. If you do not need pain medication after your surgery it is ok to stop day one. Opioids include: Codeine, Hydrocodone(Norco, Vicodin), Oxycodone(Percocet, oxycontin) and hydromorphone amongst others.  Long term and even short term use of opiods can cause: Increased pain response Dependence Constipation Depression Respiratory depression And more.  Withdrawal symptoms can include Flu like symptoms Nausea, vomiting And more Techniques to manage these symptoms Hydrate well Eat regular healthy meals Stay active Use relaxation techniques(deep breathing, meditating, yoga) Do Not substitute Alcohol to help with tapering If you have been on opioids for less  than two weeks and do not have pain than it is ok to stop all together.  Plan to wean off of opioids This plan should start within one week post op of your joint replacement. Maintain the same interval or time between taking each dose and first decrease the dose.  Cut the total daily intake of opioids by one tablet each day Next start to increase the time between doses. The last dose that should be eliminated is the evening dose.         Allergies as of 10/21/2020       Reactions   Other    Flagyl [metronidazole] Nausea Only   Hydrocodone Nausea And Vomiting, Other (See  Comments)   Can take need to take with medication for nausea and vomitting        Medication List     STOP taking these medications    acetaminophen-codeine 300-60 MG tablet Commonly known as: TYLENOL #4   aspirin EC 81 MG tablet Replaced by: aspirin 81 MG chewable tablet       TAKE these medications    aspirin 81 MG chewable tablet Chew 1 tablet (81 mg total) by mouth 2 (two) times daily. Replaces: aspirin EC 81 MG tablet   atorvastatin 80 MG tablet Commonly known as: LIPITOR Take 1 tablet (80 mg total) by mouth daily at 6 PM.   carvedilol 12.5 MG tablet Commonly known as: Coreg Take 1 tablet (12.5 mg total) by mouth 2 (two) times daily.   celecoxib 200 MG capsule Commonly known as: CELEBREX Take 1 capsule (200 mg total) by mouth 2 (two) times daily.   cholecalciferol 25 MCG (1000 UNIT) tablet Commonly known as: VITAMIN D Take 1,000 Units by mouth daily.   docusate sodium 100 MG capsule Commonly known as: COLACE Take 1 capsule (100 mg total) by mouth 2 (two) times daily.   glipiZIDE 5 MG 24 hr tablet Commonly known as: GLUCOTROL XL Take 1 tablet (5 mg total) by mouth daily with breakfast.   losartan 50 MG tablet Commonly known as: Cozaar Take 1 tablet (50 mg total) by mouth daily.   methocarbamol 500 MG tablet Commonly known as: ROBAXIN Take 1 tablet (500 mg total) by mouth every 6 (six) hours as needed for muscle spasms.   Omega-3 500 MG Caps Take 1,000 mg by mouth daily.   OneTouch Delica Plus [id number] Misc by Does not apply route.   OneTouch Verio test strip Generic drug: glucose blood Use as instructed to monitor blood glucose once daily.   oxyCODONE-acetaminophen 10-325 MG tablet Commonly known as: Percocet Take 1 tablet by mouth every 4 (four) hours as needed for up to 7 days for pain.   promethazine 12.5 MG tablet Commonly known as: PHENERGAN Take 1 tablet (12.5 mg total) by mouth every 4 (four) hours as needed for nausea or  vomiting. What changed: when to take this   traMADol 50 MG tablet Commonly known as: ULTRAM Take 1 tablet (50 mg total) by mouth every 6 (six) hours for 7 days.       ASK your doctor about these medications    clopidogrel 75 MG tablet Commonly known as: PLAVIX Take 1 tablet (75 mg total) by mouth daily. Take 4 tablets on day one then 1 tablet daily thereafter.   metFORMIN 500 MG 24 hr tablet Commonly known as: GLUCOPHAGE-XR TAKE 2 TABLETS BY MOUTH ONCE DAILY WITH BREAKFAST   nitroGLYCERIN 0.4 MG SL tablet Commonly known as: NITROSTAT PLACE 1 TABLET (  0.4 MG TOTAL) UNDER THE TONGUE EVERY 5 (FIVE) MINUTES AS NEEDED FOR CHEST PAIN.         Signed: Arther Abbott 10/21/2020, 12:32 PM

## 2020-10-27 LAB — TYPE AND SCREEN
ABO/RH(D): B POS
Antibody Screen: NEGATIVE
Unit division: 0
Unit division: 0

## 2020-10-27 LAB — BPAM RBC
Blood Product Expiration Date: 202207172359
Blood Product Expiration Date: 202207172359
Unit Type and Rh: 7300
Unit Type and Rh: 7300

## 2020-10-28 ENCOUNTER — Encounter: Payer: Self-pay | Admitting: Orthopedic Surgery

## 2020-10-28 ENCOUNTER — Other Ambulatory Visit: Payer: Self-pay

## 2020-10-28 ENCOUNTER — Ambulatory Visit (INDEPENDENT_AMBULATORY_CARE_PROVIDER_SITE_OTHER): Payer: 59 | Admitting: Orthopedic Surgery

## 2020-10-28 VITALS — Ht 65.0 in | Wt 210.0 lb

## 2020-10-28 DIAGNOSIS — Z96652 Presence of left artificial knee joint: Secondary | ICD-10-CM

## 2020-10-28 MED ORDER — PROMETHAZINE HCL 12.5 MG PO TABS: 12.5000 mg | ORAL_TABLET | ORAL | 5 refills | Status: DC | PRN

## 2020-10-28 MED ORDER — OXYCODONE HCL 15 MG PO TABS: 15.0000 mg | ORAL_TABLET | ORAL | 0 refills | Status: DC | PRN

## 2020-10-28 MED ORDER — TRAMADOL HCL 50 MG PO TABS: 50.0000 mg | ORAL_TABLET | Freq: Four times a day (QID) | ORAL | 0 refills | Status: DC

## 2020-10-28 NOTE — Progress Notes (Signed)
POST OP   Chief Complaint  Patient presents with   Routine Post Op    S/P Lt TK Revision DOS 10/20/20   Medication Refill    Tramadol and oxycodone    Encounter Diagnosis  Name Primary?   Status post revision of total replacement of left knee October 20, 2020 Yes    PROCEDURE: Revision of tibial component left knee  POV #1  Postop day 6  Meds related: Ultram, Percocet 10, Phenergan, Celebrex, Robaxin  [name] was able to remove her knee immobilizer which was protecting the quadriceps snip she can do a straight leg raise has about 80 degrees of flexion she says the Percocet is not holding her pain  Her wound looks good we placed a new dressing we will take the staples out next week  Organ to switch to oxycodone 15 mg continue Ultram and Phenergan and Celebrex follow-up in a week to take the staples out  Meds ordered this encounter  Medications   oxyCODONE (ROXICODONE) 15 MG immediate release tablet    Sig: Take 1 tablet (15 mg total) by mouth every 4 (four) hours as needed for up to 5 days for pain.    Dispense:  30 tablet    Refill:  0   promethazine (PHENERGAN) 12.5 MG tablet    Sig: Take 1 tablet (12.5 mg total) by mouth every 4 (four) hours as needed for nausea or vomiting.    Dispense:  42 tablet    Refill:  5   traMADol (ULTRAM) 50 MG tablet    Sig: Take 1 tablet (50 mg total) by mouth every 6 (six) hours for 7 days.    Dispense:  28 tablet    Refill:  0    Post op pain 7 days

## 2020-11-04 ENCOUNTER — Other Ambulatory Visit: Payer: Self-pay

## 2020-11-04 ENCOUNTER — Encounter: Payer: Self-pay | Admitting: Radiology

## 2020-11-04 ENCOUNTER — Encounter: Payer: Self-pay | Admitting: Orthopedic Surgery

## 2020-11-04 ENCOUNTER — Ambulatory Visit (INDEPENDENT_AMBULATORY_CARE_PROVIDER_SITE_OTHER): Payer: 59 | Admitting: Orthopedic Surgery

## 2020-11-04 DIAGNOSIS — Z96652 Presence of left artificial knee joint: Secondary | ICD-10-CM

## 2020-11-04 MED ORDER — OXYCODONE HCL 15 MG PO TABS: 15.0000 mg | ORAL_TABLET | ORAL | 0 refills | Status: AC | PRN

## 2020-11-04 MED ORDER — TRAMADOL HCL 50 MG PO TABS: 50.0000 mg | ORAL_TABLET | Freq: Four times a day (QID) | ORAL | 0 refills | Status: AC

## 2020-11-04 MED ORDER — PROMETHAZINE HCL 12.5 MG PO TABS: 12.5000 mg | ORAL_TABLET | ORAL | 5 refills | Status: DC | PRN

## 2020-11-04 NOTE — Patient Instructions (Signed)
Continue to ice

## 2020-11-04 NOTE — Progress Notes (Signed)
POST OP   Chief Complaint  Patient presents with   Post-op Follow-up    June 28th revision total knee left/ staples removed     Encounter Diagnosis  Name Primary?   Status post revision of total replacement of left knee October 20, 2020 Yes    PROCEDURE: Revision tibial component for aseptic loosening  POV #2, postop day 15  Meds related: tramadol, oxycodone 15, phenergan   Staples were extracted   The incision looks very good the patient has completed her home therapy she transitions to outpatient therapy the 15 mg of Roxicodone is working much better to control pain  Meds ordered this encounter  Medications   promethazine (PHENERGAN) 12.5 MG tablet    Sig: Take 1 tablet (12.5 mg total) by mouth every 4 (four) hours as needed for nausea or vomiting.    Dispense:  42 tablet    Refill:  5   oxyCODONE (ROXICODONE) 15 MG immediate release tablet    Sig: Take 1 tablet (15 mg total) by mouth every 4 (four) hours as needed for up to 5 days for pain.    Dispense:  30 tablet    Refill:  0   traMADol (ULTRAM) 50 MG tablet    Sig: Take 1 tablet (50 mg total) by mouth every 6 (six) hours for 7 days.    Dispense:  28 tablet    Refill:  0    Post op pain 7 days   F/u 4 weeks

## 2020-11-06 ENCOUNTER — Ambulatory Visit (HOSPITAL_COMMUNITY): Payer: 59 | Attending: Orthopedic Surgery | Admitting: Physical Therapy

## 2020-11-06 ENCOUNTER — Other Ambulatory Visit: Payer: Self-pay

## 2020-11-06 DIAGNOSIS — R2689 Other abnormalities of gait and mobility: Secondary | ICD-10-CM | POA: Insufficient documentation

## 2020-11-06 DIAGNOSIS — M6281 Muscle weakness (generalized): Secondary | ICD-10-CM | POA: Insufficient documentation

## 2020-11-06 DIAGNOSIS — M25662 Stiffness of left knee, not elsewhere classified: Secondary | ICD-10-CM | POA: Insufficient documentation

## 2020-11-06 DIAGNOSIS — M25562 Pain in left knee: Secondary | ICD-10-CM | POA: Diagnosis not present

## 2020-11-06 NOTE — Therapy (Signed)
[place], [place], [address] Phone: [phone]   Fax:  [phone]  Physical Therapy Evaluation  Patient Details  Name: [name] MRN: [id number] Date of Birth: 13-Feb-1971 Referring Provider (PT): Arther Abbott   Encounter Date: 11/06/2020   PT End of Session - 11/06/20 1113     Visit Number 1    Number of Visits 18    Date for PT Re-Evaluation 12/18/20    Authorization Type [employer]    Authorization - Visit Number 30    Authorization - Number of Visits 1    Progress Note Due on Visit 60    PT Start Time 8466    PT Stop Time 1130    PT Time Calculation (min) 45 min             Past Medical History:  Diagnosis Date   Arthritis    Carpal tunnel syndrome, bilateral    Coronary artery disease    Diabetes mellitus without complication ([place])    no meds; diet controlled   Diverticulitis    Hyperlipidemia    Phreesia 08/10/2020   Hypertension    Hypertriglyceridemia    Myocardial infarction ([place])    Phreesia 08/10/2020   PONV (postoperative nausea and vomiting)    Sciatica     Past Surgical History:  Procedure Laterality Date   ablasion of uterus     CARDIAC CATHETERIZATION     CARPAL TUNNEL RELEASE  11/10/2010   Procedure: CARPAL TUNNEL RELEASE;  Surgeon: Arther Abbott, MD;  Location: [facility];  Service: Orthopedics;  Laterality: Right;   CHOLECYSTECTOMY     [facility]   COLON RESECTION N/A 04/08/2015   Procedure: LAPAROSCOPIC HAND ASSISTED PARTIAL COLECTOMY  CONVERTED TO OPEN AT 5993;  Surgeon: Aviva Signs, MD;  Location: [facility];  Service: General;  Laterality: N/A;   COLONOSCOPY N/A 03/28/2014   Procedure: COLONOSCOPY;  Surgeon: Rogene Houston, MD;  Location: [facility];  Service: Endoscopy;  Laterality: N/A;  730   CORONARY STENT INTERVENTION  04/07/2020   CORONARY STENT INTERVENTION N/A 04/07/2020   Procedure: CORONARY STENT INTERVENTION;  Surgeon: Jettie Booze, MD;   Location: [facility];  Service: Cardiovascular;  Laterality: N/A;   JOINT REPLACEMENT N/A    Phreesia 08/10/2020   KNEE ARTHROSCOPY WITH DRILLING/MICROFRACTURE Left 10/20/2016   Procedure: LEFT KNEE ARTHROSCOPY WITH MICROFRACTURE;  Surgeon: Carole Civil, MD;  Location: [facility];  Service: Orthopedics;  Laterality: Left;   KNEE ARTHROSCOPY WITH DRILLING/MICROFRACTURE Left 04/27/2017   Procedure: KNEE ARTHROSCOPY WITH DRILLING/MICROFRACTURE;  Surgeon: Carole Civil, MD;  Location: [facility];  Service: Orthopedics;  Laterality: Left;   KNEE ARTHROSCOPY WITH OSTEOCHONDRAL DEFECT REPAIR Left 04/27/2017   Procedure: KNEE ARTHROSCOPY WITH OSTEOCHONDRAL DEFECT REPAIR Autograft;  Surgeon: Carole Civil, MD;  Location: [facility];  Service: Orthopedics;  Laterality: Left;   KNEE SURGERY     LEFT HEART CATH AND CORONARY ANGIOGRAPHY N/A 04/07/2020   Procedure: LEFT HEART CATH AND CORONARY ANGIOGRAPHY;  Surgeon: Jettie Booze, MD;  Location: [facility];  Service: Cardiovascular;  Laterality: N/A;   PARTIAL COLECTOMY N/A 04/08/2015   Procedure: PARTIAL COLECTOMY;  Surgeon: Aviva Signs, MD;  Location: [facility];  Service: General;  Laterality: N/A;   right knee arthroscopy     SMALL INTESTINE SURGERY N/A    Phreesia 08/10/2020   TOTAL KNEE ARTHROPLASTY Left 10/17/2017   Procedure: TOTAL KNEE ARTHROPLASTY;  Surgeon: Aline Brochure,  Tim Lair, MD;  Location: [facility];  Service: Orthopedics;  Laterality: Left;  [place]    TOTAL KNEE REVISION Left 10/20/2020   Procedure: REVISION TIBIAL COMPONENT LEFT TOTAL KNEE;  Surgeon: Carole Civil, MD;  Location: [facility];  Service: Orthopedics;  Laterality: Left;   TUBAL LIGATION      There were no vitals filed for this visit.    Subjective Assessment - 11/06/20 1058     Subjective [name] states that she had a Lt TKR in 2018; due to hardware becoming loose she had a revision on 10/20/2020.  Pt has been recieving HH therapy and stopped  Monday.    Pertinent History MI, LT TKR 2019; HTN    Limitations Sitting;Lifting;Standing;Walking;House hold activities    How long can you sit comfortably? 1-five minutes    How long can you stand comfortably? 10 minutes    How long can you walk comfortably? walking with a cane and a walker  she has not walked greater than a minute or two at a time on her own.  With therapy she was walking for about five minutes.    Patient Stated Goals less pain, move better, walk better.    Currently in Pain? Yes   worst has been a 10; best 3/10   Pain Score 3     Pain Location Knee    Pain Orientation Left    Pain Descriptors / Indicators Aching;Throbbing;Tightness    Pain Type Acute pain    Pain Onset 1 to 4 weeks ago    Pain Frequency Constant    Aggravating Factors  activity, bending    Pain Relieving Factors meds, and ice    Effect of Pain on Daily Activities limits                [facility] PT Assessment - 11/06/20 0001       Assessment   Medical Diagnosis Lt TKR revision    Referring Provider (PT) Arther Abbott    Onset Date/Surgical Date 10/20/20    Prior Therapy hospital      Precautions   Precautions None      Balance Screen   Has the patient fallen in the past 6 months No    Has the patient had a decrease in activity level because of a fear of falling?  Yes    Is the patient reluctant to leave their home because of a fear of falling?  No      Home Environment   Living Environment Private residence    Home Access Stairs to enter    Entrance Stairs-Number of Steps 1    [place] to live on main level with bedroom/bathroom    Additional Comments unable to complete steps in a reciprocal manner.      Prior Function   Level of Independence Independent    Vocation Full time employment    Vocation Requirements nuse; 8 hours    Leisure walk      Cognition   Overall Cognitive Status Within Functional Limits for tasks assessed      Observation/Other Assessments    Focus on Therapeutic Outcomes (FOTO)  32; 68% affected.      Functional Tests   Functional tests Single leg stance;Sit to Stand      Single Leg Stance   Comments unable do to pain      Sit to Stand   Comments unable to come sit to stand without using her arms  ROM / Strength   AROM / PROM / Strength AROM;Strength      AROM   AROM Assessment Site Knee    Right/Left Knee Left    Left Knee Extension 8    Left Knee Flexion 104      Strength   Strength Assessment Site Hip;Knee;Ankle    Right/Left Hip Right;Left    Right Hip Flexion 5/5    Right Hip Extension 5/5    Right Hip ABduction 5/5    Left Hip Flexion 3-/5   significant extension lag with SLR   Left Hip Extension 5/5    Left Hip ABduction 5/5    Right/Left Knee Right;Left    Right Knee Flexion 5/5    Right Knee Extension 5/5    Left Knee Flexion 5/5    Left Knee Extension 3/5    Right/Left Ankle Right;Left    Right Ankle Dorsiflexion 5/5    Left Ankle Dorsiflexion 5/5      Ambulation/Gait   Ambulation Distance (Feet) 148 Feet    Assistive device Straight cane    Gait Pattern Decreased arm swing - left;Decreased dorsiflexion - left    Gait Comments 2 minutes walk test                        Objective measurements completed on examination: See above findings.       [facility] Adult PT Treatment/Exercise - 11/06/20 0001       Exercises   Exercises Knee/Hip      Knee/Hip Exercises: Seated   Long Arc Quad Left;5 reps    Heel Slides Left;5 reps      Knee/Hip Exercises: Supine   Quad Sets Left;10 reps    Heel Slides Left;5 reps    Straight Leg Raises Left;5 reps                    PT Education - 11/06/20 1112     Education Details HEP    Person(s) Educated Patient    Methods Explanation;Tactile cues;Demonstration;Verbal cues;Handout    Comprehension Verbalized understanding;Returned demonstration              PT Short Term Goals - 11/06/20 1134       PT SHORT TERM  GOAL #1   Title Pt will be independent with HEP and perform consistently in order to decrease pain and maximize ROM.    Time 3    Period Weeks    Status New    Target Date 11/27/20      PT SHORT TERM GOAL #2   Title Pt will have improved L knee AROM from 4-120 deg to maximize gait.    Time 3    Period Weeks    Status New      PT SHORT TERM GOAL #3   Title PT pain in LT knee will be no greater than a 6/10 to allow pt to be waking only one time a night for better sleep.    Time 3    Period Weeks    Status New      PT SHORT TERM GOAL #4   Title Pt will have 1/2 grade improvement in MMT throughout in order to maximize gait and functional mobility.    Time 3    Period Weeks    Status New               PT Long Term Goals - 11/06/20 1136  PT LONG TERM GOAL #1   Title Pt will have improved L knee AROM from 0-125deg in order to further maximize gait as well as sitting tolerance and stair ambulation.    Time 6    Period Weeks    Status New    Target Date 12/18/20      PT LONG TERM GOAL #2   Title Pt will be able to perform 5xSTS in 10 sec or < with no UE and mechanics WFL to demo improved balance and functional strength.    Time 6    Period Weeks    Status New      PT LONG TERM GOAL #3   Title Pt will be able to perform L SLS for 30 sec or > to demo improved balance and functional strength in order to maximize gait and stair ambulation.    Time 6    Period Weeks    Status New      PT LONG TERM GOAL #4   Title Pt will be able to ambulate 649ft during 3MWT with LRAD and gait WFL in order to maximize return to PLOF.    Time 6    Period Weeks    Status New      PT LONG TERM GOAL #5   Title Pt will have 1 grade improvement throughout BLE MMT in order to be able to go and down  up 4-8 steps in a reciprocal manner with one hand on railing    Time 6    Period Weeks    Status New      Additional Long Term Goals   Additional Long Term Goals Yes      PT LONG TERM  GOAL #6   Title PT pain in Lt knee to be no greater than a 3/10 to allow pt to be able to sleep throughout the night    Time 6    Period Weeks    Status New                    Plan - 11/06/20 1115     Clinical Impression Statement PT is a 50 yo female who had a LT total knee revision on 10/20/20.  She had home health and is now being referred Out patient therapy.  Evaluation demonstrates abnormal gait, increased edema, increased pain, decreased balance, decreased ROM, deceased strength, and decreased activity tolerance.  [name] will benefit from skilled PT to address these deficits and improve her functional mobility.    Personal Factors and Comorbidities Past/Current Experience;Comorbidity 2    Comorbidities Lt TKR in 2018/ MI in December 2021    Examination-Activity Limitations Bathing;Bend;Carry;Caring for Others;Dressing;Hygiene/Grooming;Lift;Locomotion Level;Toileting;Stand;Stairs;Squat;Sleep;Sit    Examination-Participation Restrictions Church;Cleaning;Community Activity;Driving;Laundry;Meal Prep;Occupation;Shop;Yard Work    Stability/Clinical Decision Making Stable/Uncomplicated    Clinical Decision Making Moderate    Rehab Potential Good    PT Frequency 3x / week    PT Duration 6 weeks    PT Treatment/Interventions Therapeutic exercise;Therapeutic activities;Patient/family education;Passive range of motion;Balance training;Manual techniques    PT Next Visit Plan Manual for pain reduction and edema control, ROM and strengthening of Lt quad and hip flexors    PT Home Exercise Plan Eval:  sitting: LAQ, heelslide, supine quad set, heel slide and SLR             Patient will benefit from skilled therapeutic intervention in order to improve the following deficits and impairments:  Abnormal gait, Decreased activity tolerance, Decreased balance, Decreased mobility,  Difficulty walking, Increased edema, Increased fascial restricitons, Pain, Decreased range of motion,  Decreased strength  Visit Diagnosis: Acute pain of left knee - Plan: PT plan of care cert/re-cert  Stiffness of left knee, not elsewhere classified - Plan: PT plan of care cert/re-cert  Other abnormalities of gait and mobility - Plan: PT plan of care cert/re-cert  Muscle weakness (generalized) - Plan: PT plan of care cert/re-cert     Problem List Patient Active Problem List   Diagnosis Date Noted   S/P revision of total knee, 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[place])    Chronic pain in left foot 08/31/2020   Coronary artery disease    Diabetes mellitus without complication ([place])    Hypertension    Hypertriglyceridemia    Non-ST elevation (NSTEMI) myocardial infarction ([place])    Chest pain 04/06/2020   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    Chondral defect of condyle of right femur    S/P left knee arthroscopy 04/27/17 03/28/2017   S/P total knee replacement, 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    Abdominal pain 02/17/2015   Nausea without vomiting 02/17/2015   Diverticulitis of intestine with abscess 01/08/2015   Acute diverticulitis 01/08/2015   Nausea 12/31/2014   Diverticulitis 12/31/2014   Hyperglycemia 12/31/2014   Medication reaction 12/31/2014   Diverticulitis of large intestine without perforation or abscess without bleeding    Thrush, oral    Fatty liver 03/11/2014   Family hx of colon cancer requiring screening colonoscopy 03/11/2014   Rt flank pain 03/11/2014   Ulnar nerve compression 06/14/2011   Compartment syndrome, nontraumatic 06/08/2011   LOW BACK PAIN 11/07/2007   SCIATICA 11/07/2007   LUMBAR SPASM 11/07/2007    [name], PT CLT [phone]  11/06/2020, 11:41 AM  [place] [address] [place], [place], [address] Phone: [phone]   Fax:  [phone]  Name: TAHISHA HAKIM MRN: [id number] Date of Birth:  [birth year]

## 2020-11-09 ENCOUNTER — Ambulatory Visit (HOSPITAL_COMMUNITY): Payer: 59

## 2020-11-10 ENCOUNTER — Encounter (HOSPITAL_COMMUNITY): Payer: Self-pay | Admitting: Physical Therapy

## 2020-11-10 ENCOUNTER — Other Ambulatory Visit: Payer: Self-pay

## 2020-11-10 ENCOUNTER — Ambulatory Visit (HOSPITAL_COMMUNITY): Payer: 59 | Admitting: Physical Therapy

## 2020-11-10 DIAGNOSIS — R2689 Other abnormalities of gait and mobility: Secondary | ICD-10-CM

## 2020-11-10 DIAGNOSIS — M25562 Pain in left knee: Secondary | ICD-10-CM

## 2020-11-10 DIAGNOSIS — M25662 Stiffness of left knee, not elsewhere classified: Secondary | ICD-10-CM

## 2020-11-10 DIAGNOSIS — M6281 Muscle weakness (generalized): Secondary | ICD-10-CM

## 2020-11-10 NOTE — Patient Instructions (Signed)
Access Code: [email or web address] URL: [email or web address] Date: 11/10/2020 Prepared by: Josue Hector  Exercises Supine Bridge - 3 x daily - 7 x weekly - 2 sets - 10 reps Seated Hamstring Stretch - 3 x daily - 7 x weekly - 1 sets - 5 reps - 20 second hold

## 2020-11-10 NOTE — Therapy (Signed)
[place], [place], [address] Phone: [phone]   Fax:  [phone]  Physical Therapy Treatment  Patient Details  Name: [name] MRN: [id number] Date of Birth: 1971/02/01 Referring Provider (PT): Arther Abbott   Encounter Date: 11/10/2020   PT End of Session - 11/10/20 0903     Visit Number 2    Number of Visits 18    Date for PT Re-Evaluation 12/18/20    Authorization Type [employer]    Authorization - Visit Number 30    Authorization - Number of Visits 2    Progress Note Due on Visit 28    PT Start Time 6269    PT Stop Time 0942    PT Time Calculation (min) 43 min    Activity Tolerance Patient tolerated treatment well    Behavior During Therapy [facility] for tasks assessed/performed             Past Medical History:  Diagnosis Date   Arthritis    Carpal tunnel syndrome, bilateral    Coronary artery disease    Diabetes mellitus without complication ([place])    no meds; diet controlled   Diverticulitis    Hyperlipidemia    Phreesia 08/10/2020   Hypertension    Hypertriglyceridemia    Myocardial infarction ([place])    Phreesia 08/10/2020   PONV (postoperative nausea and vomiting)    Sciatica     Past Surgical History:  Procedure Laterality Date   ablasion of uterus     CARDIAC CATHETERIZATION     CARPAL TUNNEL RELEASE  11/10/2010   Procedure: CARPAL TUNNEL RELEASE;  Surgeon: Arther Abbott, MD;  Location: [facility];  Service: Orthopedics;  Laterality: Right;   CHOLECYSTECTOMY     [facility]   COLON RESECTION N/A 04/08/2015   Procedure: LAPAROSCOPIC HAND ASSISTED PARTIAL COLECTOMY  CONVERTED TO OPEN AT 4854;  Surgeon: Aviva Signs, MD;  Location: [facility];  Service: General;  Laterality: N/A;   COLONOSCOPY N/A 03/28/2014   Procedure: COLONOSCOPY;  Surgeon: Rogene Houston, MD;  Location: [facility];  Service: Endoscopy;  Laterality: N/A;  730   CORONARY STENT INTERVENTION  04/07/2020   CORONARY  STENT INTERVENTION N/A 04/07/2020   Procedure: CORONARY STENT INTERVENTION;  Surgeon: Jettie Booze, MD;  Location: [place] [facility];  Service: Cardiovascular;  Laterality: N/A;   JOINT REPLACEMENT N/A    Phreesia 08/10/2020   KNEE ARTHROSCOPY WITH DRILLING/MICROFRACTURE Left 10/20/2016   Procedure: LEFT KNEE ARTHROSCOPY WITH MICROFRACTURE;  Surgeon: Carole Civil, MD;  Location: [facility];  Service: Orthopedics;  Laterality: Left;   KNEE ARTHROSCOPY WITH DRILLING/MICROFRACTURE Left 04/27/2017   Procedure: KNEE ARTHROSCOPY WITH DRILLING/MICROFRACTURE;  Surgeon: Carole Civil, MD;  Location: [facility];  Service: Orthopedics;  Laterality: Left;   KNEE ARTHROSCOPY WITH OSTEOCHONDRAL DEFECT REPAIR Left 04/27/2017   Procedure: KNEE ARTHROSCOPY WITH OSTEOCHONDRAL DEFECT REPAIR Autograft;  Surgeon: Carole Civil, MD;  Location: [facility];  Service: Orthopedics;  Laterality: Left;   KNEE SURGERY     LEFT HEART CATH AND CORONARY ANGIOGRAPHY N/A 04/07/2020   Procedure: LEFT HEART CATH AND CORONARY ANGIOGRAPHY;  Surgeon: Jettie Booze, MD;  Location: [place] [facility];  Service: Cardiovascular;  Laterality: N/A;   PARTIAL COLECTOMY N/A 04/08/2015   Procedure: PARTIAL COLECTOMY;  Surgeon: Aviva Signs, MD;  Location: [facility];  Service: General;  Laterality: N/A;   right knee arthroscopy     SMALL INTESTINE SURGERY N/A  Phreesia 08/10/2020   TOTAL KNEE ARTHROPLASTY Left 10/17/2017   Procedure: TOTAL KNEE ARTHROPLASTY;  Surgeon: Carole Civil, MD;  Location: [facility];  Service: Orthopedics;  Laterality: Left;  DePuy    TOTAL KNEE REVISION Left 10/20/2020   Procedure: REVISION TIBIAL COMPONENT LEFT TOTAL KNEE;  Surgeon: Carole Civil, MD;  Location: [facility];  Service: Orthopedics;  Laterality: Left;   TUBAL LIGATION      There were no vitals filed for this visit.   Subjective Assessment - 11/10/20 0902     Subjective Pain is improving, "it is tolerable for the most  part".    Pertinent History MI, LT TKR 2019; HTN    Limitations Sitting;Lifting;Standing;Walking;House hold activities    How long can you sit comfortably? 1-five minutes    How long can you stand comfortably? 10 minutes    How long can you walk comfortably? walking with a cane and a walker  she has not walked greater than a minute or two at a time on her own.  With therapy she was walking for about five minutes.    Patient Stated Goals less pain, move better, walk better.    Currently in Pain? Yes    Pain Score 5     Pain Location Knee    Pain Orientation Left    Pain Descriptors / Indicators Aching;Tightness    Pain Type Acute pain    Pain Onset 1 to 4 weeks ago    Pain Frequency Constant                               [facility] Adult PT Treatment/Exercise - 11/10/20 0001       Knee/Hip Exercises: Stretches   Passive Hamstring Stretch 5 reps;20 seconds    Passive Hamstring Stretch Limitations seated EOB    Gastroc Stretch 3 reps;30 seconds    Gastroc Stretch Limitations slant board      Knee/Hip Exercises: Standing   Heel Raises 20 reps      Knee/Hip Exercises: Seated   Long Arc Quad 2 sets;10 reps    Heel Slides AAROM;Left;1 set;10 reps      Knee/Hip Exercises: Supine   Quad Sets Left;15 reps    Heel Slides Left;1 set;15 reps;AAROM    Bridges 2 sets;20 reps    Straight Leg Raises Left;2 sets;10 reps    Knee Extension AROM;Left    Knee Extension Limitations 3    Knee Flexion AROM;Left    Knee Flexion Limitations 105      Manual Therapy   Manual Therapy Soft tissue mobilization    Manual therapy comments done seperate from all other aspects of treatment    Soft tissue mobilization STM to LT knee adductors, quad, ITB with LE elevated                      PT Short Term Goals - 11/06/20 1134       PT SHORT TERM GOAL #1   Title Pt will be independent with HEP and perform consistently in order to decrease pain and maximize ROM.    Time 3     Period Weeks    Status New    Target Date 11/27/20      PT SHORT TERM GOAL #2   Title Pt will have improved L knee AROM from 4-120 deg to maximize gait.    Time 3    Period Weeks  Status New      PT SHORT TERM GOAL #3   Title PT pain in LT knee will be no greater than a 6/10 to allow pt to be waking only one time a night for better sleep.    Time 3    Period Weeks    Status New      PT SHORT TERM GOAL #4   Title Pt will have 1/2 grade improvement in MMT throughout in order to maximize gait and functional mobility.    Time 3    Period Weeks    Status New               PT Long Term Goals - 11/06/20 1136       PT LONG TERM GOAL #1   Title Pt will have improved L knee AROM from 0-125deg in order to further maximize gait as well as sitting tolerance and stair ambulation.    Time 6    Period Weeks    Status New    Target Date 12/18/20      PT LONG TERM GOAL #2   Title Pt will be able to perform 5xSTS in 10 sec or < with no UE and mechanics WFL to demo improved balance and functional strength.    Time 6    Period Weeks    Status New      PT LONG TERM GOAL #3   Title Pt will be able to perform L SLS for 30 sec or > to demo improved balance and functional strength in order to maximize gait and stair ambulation.    Time 6    Period Weeks    Status New      PT LONG TERM GOAL #4   Title Pt will be able to ambulate 629ft during 3MWT with LRAD and gait WFL in order to maximize return to PLOF.    Time 6    Period Weeks    Status New      PT LONG TERM GOAL #5   Title Pt will have 1 grade improvement throughout BLE MMT in order to be able to go and down  up 4-8 steps in a reciprocal manner with one hand on railing    Time 6    Period Weeks    Status New      Additional Long Term Goals   Additional Long Term Goals Yes      PT LONG TERM GOAL #6   Title PT pain in Lt knee to be no greater than a 3/10 to allow pt to be able to sleep throughout the night    Time 6     Period Weeks    Status New                   Plan - 11/10/20 2993     Clinical Impression Statement Reviewed goals and HEP. Initiated ther ex. Progressed LE strength and knee mobility activity. Patient tolerated session well overall. Educated patient on purpose and function of all added exercises. Patient will continue to benefit from skilled therapy services to reduce deficits and improve functional ability.    Personal Factors and Comorbidities Past/Current Experience;Comorbidity 2    Comorbidities Lt TKR in 2018/ MI in December 2021    Examination-Activity Limitations Bathing;Bend;Carry;Caring for Others;Dressing;Hygiene/Grooming;Lift;Locomotion Level;Toileting;Stand;Stairs;Squat;Sleep;Sit    Examination-Participation Restrictions Church;Cleaning;Community Activity;Driving;Laundry;Meal Prep;Occupation;Shop;Yard Work    Stability/Clinical Decision Making Stable/Uncomplicated    Rehab Potential Good    PT Frequency 3x / week  PT Duration 6 weeks    PT Treatment/Interventions Therapeutic exercise;Therapeutic activities;Patient/family education;Passive range of motion;Balance training;Manual techniques    PT Next Visit Plan Manual for pain reduction and edema control, ROM and strengthening of Lt quad and hip flexors. Try bike    PT Home Exercise Plan Eval:  sitting: LAQ, heelslide, supine quad set, heel slide and SLR             Patient will benefit from skilled therapeutic intervention in order to improve the following deficits and impairments:  Abnormal gait, Decreased activity tolerance, Decreased balance, Decreased mobility, Difficulty walking, Increased edema, Increased fascial restricitons, Pain, Decreased range of motion, Decreased strength  Visit Diagnosis: Stiffness of left knee, not elsewhere classified  Acute pain of left knee  Other abnormalities of gait and mobility  Muscle weakness (generalized)     Problem List Patient Active Problem List    Diagnosis Date Noted   S/P revision of total knee, 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)    Chronic pain in left foot 08/31/2020   Coronary artery disease    Diabetes mellitus without complication (HCC)    Hypertension    Hypertriglyceridemia    Non-ST elevation (NSTEMI) myocardial infarction ([place])    Chest pain 04/06/2020   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    Chondral defect of condyle of right femur    S/P left knee arthroscopy 04/27/17 03/28/2017   S/P total knee replacement, 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    Abdominal pain 02/17/2015   Nausea without vomiting 02/17/2015   Diverticulitis of intestine with abscess 01/08/2015   Acute diverticulitis 01/08/2015   Nausea 12/31/2014   Diverticulitis 12/31/2014   Hyperglycemia 12/31/2014   Medication reaction 12/31/2014   Diverticulitis of large intestine without perforation or abscess without bleeding    Thrush, oral    Fatty liver 03/11/2014   Family hx of colon cancer requiring screening colonoscopy 03/11/2014   Rt flank pain 03/11/2014   Ulnar nerve compression 06/14/2011   Compartment syndrome, nontraumatic 06/08/2011   LOW BACK PAIN 11/07/2007   SCIATICA 11/07/2007   LUMBAR SPASM 11/07/2007   9:44 AM, 11/10/20 Josue Hector PT DPT  Physical Therapist with [facility]  [phone] [place] [place], [place], [address] Phone: [phone]   Fax:  [phone]  Name: [name] MRN: [id number] Date of Birth: May 15, 1970

## 2020-11-11 ENCOUNTER — Telehealth: Payer: Self-pay | Admitting: Orthopedic Surgery

## 2020-11-11 NOTE — Telephone Encounter (Signed)
Patient of Dr Ruthe Mannan requests prescription for Tramadol 50 mgs.  Qty  28  Sig: Take 1 tablet (50 mg total) by mouth every 6 (six) hours for 7 days.  Patient uses [employer] in [place]

## 2020-11-11 NOTE — Telephone Encounter (Signed)
Dr Ruthe Mannan pt requests prescription for Oxycodone 15 mgs.  Qty  30  Sig: Take 1 tablet (15 mg total) by mouth every 4 (four) hours as needed for up to 5 days for pain.  She uses [employer] [facility]

## 2020-11-12 ENCOUNTER — Encounter (HOSPITAL_COMMUNITY): Payer: Self-pay | Admitting: Physical Therapy

## 2020-11-12 ENCOUNTER — Other Ambulatory Visit: Payer: Self-pay

## 2020-11-12 ENCOUNTER — Ambulatory Visit (HOSPITAL_COMMUNITY): Payer: 59 | Admitting: Physical Therapy

## 2020-11-12 ENCOUNTER — Other Ambulatory Visit: Payer: Self-pay | Admitting: Family Medicine

## 2020-11-12 DIAGNOSIS — R2689 Other abnormalities of gait and mobility: Secondary | ICD-10-CM

## 2020-11-12 DIAGNOSIS — M6281 Muscle weakness (generalized): Secondary | ICD-10-CM

## 2020-11-12 DIAGNOSIS — M25662 Stiffness of left knee, not elsewhere classified: Secondary | ICD-10-CM

## 2020-11-12 DIAGNOSIS — M25562 Pain in left knee: Secondary | ICD-10-CM | POA: Diagnosis not present

## 2020-11-12 MED ORDER — TRAMADOL HCL 50 MG PO TABS: 50.0000 mg | ORAL_TABLET | Freq: Four times a day (QID) | ORAL | 0 refills | Status: DC | PRN

## 2020-11-12 MED ORDER — OXYCODONE HCL ER 15 MG PO T12A: 15.0000 mg | EXTENDED_RELEASE_TABLET | Freq: Two times a day (BID) | ORAL | 0 refills | Status: DC

## 2020-11-12 NOTE — Therapy (Signed)
[place], [place], [address] Phone: [phone]   Fax:  [phone]  Physical Therapy Treatment  Patient Details  Name: [name] MRN: [id number] Date of Birth: 25-Jun-1970 Referring Provider (PT): Arther Abbott   Encounter Date: 11/12/2020   PT End of Session - 11/12/20 0832     Visit Number 3    Number of Visits 18    Date for PT Re-Evaluation 12/18/20    Authorization Type [employer]    Authorization - Visit Number 3    Authorization - Number of Visits 30    Progress Note Due on Visit 61    PT Start Time 0831    PT Stop Time 0910    PT Time Calculation (min) 39 min    Activity Tolerance Patient tolerated treatment well    Behavior During Therapy [facility] for tasks assessed/performed             Past Medical History:  Diagnosis Date   Arthritis    Carpal tunnel syndrome, bilateral    Coronary artery disease    Diabetes mellitus without complication ([place])    no meds; diet controlled   Diverticulitis    Hyperlipidemia    Phreesia 08/10/2020   Hypertension    Hypertriglyceridemia    Myocardial infarction ([place])    Phreesia 08/10/2020   PONV (postoperative nausea and vomiting)    Sciatica     Past Surgical History:  Procedure Laterality Date   ablasion of uterus     CARDIAC CATHETERIZATION     CARPAL TUNNEL RELEASE  11/10/2010   Procedure: CARPAL TUNNEL RELEASE;  Surgeon: Arther Abbott, MD;  Location: [facility];  Service: Orthopedics;  Laterality: Right;   CHOLECYSTECTOMY     [facility]   COLON RESECTION N/A 04/08/2015   Procedure: LAPAROSCOPIC HAND ASSISTED PARTIAL COLECTOMY  CONVERTED TO OPEN AT 0258;  Surgeon: Aviva Signs, MD;  Location: [facility];  Service: General;  Laterality: N/A;   COLONOSCOPY N/A 03/28/2014   Procedure: COLONOSCOPY;  Surgeon: Rogene Houston, MD;  [facility];  Service: Endoscopy;  Laterality: N/A;  730   CORONARY STENT INTERVENTION  04/07/2020   CORONARY  STENT INTERVENTION N/A 04/07/2020   Procedure: CORONARY STENT INTERVENTION;  Surgeon: Jettie Booze, MD;  Location: [place] [facility];  Service: Cardiovascular;  Laterality: N/A;   JOINT REPLACEMENT N/A    Phreesia 08/10/2020   KNEE ARTHROSCOPY WITH DRILLING/MICROFRACTURE Left 10/20/2016   Procedure: LEFT KNEE ARTHROSCOPY WITH MICROFRACTURE;  Surgeon: Carole Civil, MD;  Location: [facility];  Service: Orthopedics;  Laterality: Left;   KNEE ARTHROSCOPY WITH DRILLING/MICROFRACTURE Left 04/27/2017   Procedure: KNEE ARTHROSCOPY WITH DRILLING/MICROFRACTURE;  Surgeon: Carole Civil, MD;  Location: [facility];  Service: Orthopedics;  Laterality: Left;   KNEE ARTHROSCOPY WITH OSTEOCHONDRAL DEFECT REPAIR Left 04/27/2017   Procedure: KNEE ARTHROSCOPY WITH OSTEOCHONDRAL DEFECT REPAIR Autograft;  Surgeon: Carole Civil, MD;  Location: [facility];  Service: Orthopedics;  Laterality: Left;   KNEE SURGERY     LEFT HEART CATH AND CORONARY ANGIOGRAPHY N/A 04/07/2020   Procedure: LEFT HEART CATH AND CORONARY ANGIOGRAPHY;  Surgeon: Jettie Booze, MD;  Location: [place] [facility];  Service: Cardiovascular;  Laterality: N/A;   PARTIAL COLECTOMY N/A 04/08/2015   Procedure: PARTIAL COLECTOMY;  Surgeon: Aviva Signs, MD;  Location: [facility];  Service: General;  Laterality: N/A;   right knee arthroscopy     SMALL INTESTINE SURGERY N/A  Phreesia 08/10/2020   TOTAL KNEE ARTHROPLASTY Left 10/17/2017   Procedure: TOTAL KNEE ARTHROPLASTY;  Surgeon: Carole Civil, MD;  Location: [facility];  Service: Orthopedics;  Laterality: Left;  DePuy    TOTAL KNEE REVISION Left 10/20/2020   Procedure: REVISION TIBIAL COMPONENT LEFT TOTAL KNEE;  Surgeon: Carole Civil, MD;  Location: [facility];  Service: Orthopedics;  Laterality: Left;   TUBAL LIGATION      There were no vitals filed for this visit.   Subjective Assessment - 11/12/20 0836     Subjective States she currently has about 7/10. States she  took her pain meds around 730 this morning. Reports that it is a little early for her.    Pertinent History MI, LT TKR 2019; HTN    Limitations Sitting;Lifting;Standing;Walking;House hold activities    How long can you sit comfortably? 1-five minutes    How long can you stand comfortably? 10 minutes    How long can you walk comfortably? walking with a cane and a walker  she has not walked greater than a minute or two at a time on her own.  With therapy she was walking for about five minutes.    Patient Stated Goals less pain, move better, walk better.    Currently in Pain? Yes    Pain Score 7     Pain Orientation Left    Pain Descriptors / Indicators Throbbing;Tightness    Pain Onset 1 to 4 weeks ago                [facility] PT Assessment - 11/12/20 0001       Assessment   Medical Diagnosis Lt TKR revision    Referring Provider (PT) Arther Abbott    Onset Date/Surgical Date 10/20/20    Next MD Visit 12/02/20                           [facility] Adult PT Treatment/Exercise - 11/12/20 0001       Knee/Hip Exercises: Stretches   Passive Hamstring Stretch 3 reps;30 seconds;Left    Other Knee/Hip Stretches left knee flexion 30"  x3 holds seated      Knee/Hip Exercises: Aerobic   Nustep 4.5 minutes for mobility      Knee/Hip Exercises: Standing   Heel Raises 20 reps   2 second hold   Other Standing Knee Exercises wt shifts x10 10" holds L      Knee/Hip Exercises: Seated   Long Arc Quad 2 sets;10 reps;Left      Knee/Hip Exercises: Supine   Short Arc Quad Sets Left;2 sets;15 reps   5" holds - large bolster   Heel Slides Left;1 set;15 reps;AAROM    Heel Slides Limitations 10 second holds    Knee Extension AROM;Left    Knee Extension Limitations 2   lacking   Knee Flexion AROM;Left    Knee Flexion Limitations 106    Other Supine Knee/Hip Exercises tibial bouncing on ball 3 minutes; hamstring isometric on ball 2x10 5" holds L    Other Supine Knee/Hip Exercises  kne flexion/extension on ball 3 minutes      Manual Therapy   Manual Therapy Soft tissue mobilization;Joint mobilization    Manual therapy comments done seperate from all other aspects of treatment    Joint Mobilization patella mobilization L in all directions as tolerated    Soft tissue mobilization STM to LT knee adductors, quad, ITB with LE elevated  PT Short Term Goals - 11/06/20 1134       PT SHORT TERM GOAL #1   Title Pt will be independent with HEP and perform consistently in order to decrease pain and maximize ROM.    Time 3    Period Weeks    Status New    Target Date 11/27/20      PT SHORT TERM GOAL #2   Title Pt will have improved L knee AROM from 4-120 deg to maximize gait.    Time 3    Period Weeks    Status New      PT SHORT TERM GOAL #3   Title PT pain in LT knee will be no greater than a 6/10 to allow pt to be waking only one time a night for better sleep.    Time 3    Period Weeks    Status New      PT SHORT TERM GOAL #4   Title Pt will have 1/2 grade improvement in MMT throughout in order to maximize gait and functional mobility.    Time 3    Period Weeks    Status New               PT Long Term Goals - 11/06/20 1136       PT LONG TERM GOAL #1   Title Pt will have improved L knee AROM from 0-125deg in order to further maximize gait as well as sitting tolerance and stair ambulation.    Time 6    Period Weeks    Status New    Target Date 12/18/20      PT LONG TERM GOAL #2   Title Pt will be able to perform 5xSTS in 10 sec or < with no UE and mechanics WFL to demo improved balance and functional strength.    Time 6    Period Weeks    Status New      PT LONG TERM GOAL #3   Title Pt will be able to perform L SLS for 30 sec or > to demo improved balance and functional strength in order to maximize gait and stair ambulation.    Time 6    Period Weeks    Status New      PT LONG TERM GOAL #4   Title Pt will  be able to ambulate 665ft during 3MWT with LRAD and gait WFL in order to maximize return to PLOF.    Time 6    Period Weeks    Status New      PT LONG TERM GOAL #5   Title Pt will have 1 grade improvement throughout BLE MMT in order to be able to go and down  up 4-8 steps in a reciprocal manner with one hand on railing    Time 6    Period Weeks    Status New      Additional Long Term Goals   Additional Long Term Goals Yes      PT LONG TERM GOAL #6   Title PT pain in Lt knee to be no greater than a 3/10 to allow pt to be able to sleep throughout the night    Time 6    Period Weeks    Status New                   Plan - 11/12/20 0847     Clinical Impression Statement Continued with LE strengthening and mobility. Slight increase  in pain noted on this date but minimal heat and swelling noted in the knee. Tension noted along distal quad where pain present. Focused manual on this area. ROM continues to measure about the same and today measured 2-106. 7/10 pain noted end of session. Will continue with ROM and strengthening as tolerated by patient.    Personal Factors and Comorbidities Past/Current Experience;Comorbidity 2    Comorbidities Lt TKR in 2018/ MI in December 2021    Examination-Activity Limitations Bathing;Bend;Carry;Caring for Others;Dressing;Hygiene/Grooming;Lift;Locomotion Level;Toileting;Stand;Stairs;Squat;Sleep;Sit    Examination-Participation Restrictions Church;Cleaning;Community Activity;Driving;Laundry;Meal Prep;Occupation;Shop;Yard Work    Stability/Clinical Decision Making Stable/Uncomplicated    Rehab Potential Good    PT Frequency 3x / week    PT Duration 6 weeks    PT Treatment/Interventions Therapeutic exercise;Therapeutic activities;Patient/family education;Passive range of motion;Balance training;Manual techniques    PT Next Visit Plan Manual for pain reduction and edema control, ROM and strengthening of Lt quad and hip flexors. Try bike    PT Home  Exercise Plan Eval:  sitting: LAQ, heelslide, supine quad set, heel slide and SLR             Patient will benefit from skilled therapeutic intervention in order to improve the following deficits and impairments:  Abnormal gait, Decreased activity tolerance, Decreased balance, Decreased mobility, Difficulty walking, Increased edema, Increased fascial restricitons, Pain, Decreased range of motion, Decreased strength  Visit Diagnosis: Stiffness of left knee, not elsewhere classified  Acute pain of left knee  Other abnormalities of gait and mobility  Muscle weakness (generalized)     Problem List Patient Active Problem List   Diagnosis Date Noted   S/P revision of total knee, 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)    Chronic pain in left foot 08/31/2020   Coronary artery disease    Diabetes mellitus without complication (HCC)    Hypertension    Hypertriglyceridemia    Non-ST elevation (NSTEMI) myocardial infarction ([place])    Chest pain 04/06/2020   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    Chondral defect of condyle of right femur    S/P left knee arthroscopy 04/27/17 03/28/2017   S/P total knee replacement, 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    Abdominal pain 02/17/2015   Nausea without vomiting 02/17/2015   Diverticulitis of intestine with abscess 01/08/2015   Acute diverticulitis 01/08/2015   Nausea 12/31/2014   Diverticulitis 12/31/2014   Hyperglycemia 12/31/2014   Medication reaction 12/31/2014   Diverticulitis of large intestine without perforation or abscess without bleeding    Thrush, oral    Fatty liver 03/11/2014   Family hx of colon cancer requiring screening colonoscopy 03/11/2014   Rt flank pain 03/11/2014   Ulnar nerve compression 06/14/2011   Compartment syndrome, nontraumatic 06/08/2011   LOW BACK PAIN 11/07/2007   SCIATICA 11/07/2007   LUMBAR SPASM 11/07/2007  9:14 AM,  11/12/20 Jerene Pitch, DPT Physical Therapy with [facility]  [phone] office   [place], [place], [address] Phone: [phone]   Fax:  [phone]  Name: [name] MRN: [id number] Date of Birth: 21-Jan-1971

## 2020-11-12 NOTE — Telephone Encounter (Signed)
Done

## 2020-11-12 NOTE — Telephone Encounter (Signed)
Patient called to relay she went to pick up the prescription from [facility] and was told prior authorization is required. States she is now out of the medication.  oxyCODONE (OXYCONTIN) 15 mg 12 hr tablet 14 tablet   Patient said she was able to receive the Tramadol which was also requested.  I relayed Dr Luna Glasgow does not typically do the authorizations and will message our practice administrator to advise.

## 2020-11-12 NOTE — Telephone Encounter (Signed)
Called back to patient; left message to return call per response from [place]. Cc to Amy

## 2020-11-13 ENCOUNTER — Ambulatory Visit (HOSPITAL_COMMUNITY): Payer: 59 | Admitting: Physical Therapy

## 2020-11-13 ENCOUNTER — Telehealth: Payer: Self-pay

## 2020-11-13 NOTE — Telephone Encounter (Signed)
Patient returned call Friday; routed to clinic staff present in office today to speak with patient - [name].

## 2020-11-13 NOTE — Telephone Encounter (Signed)
I called to spoke with the patient about her medication needing PA and to explain that normally Dr. Luna Glasgow doesn't do PA's for medication. I told her that I never saw a PA request by pharmacy about it and that I was sorry. She asked "who the hell do I need to report this to" I told her she was more than welcome to speak with my manager on Monday when she returns to the office. I told her that I understood that she was in pain, but there wasn't anything that I could do and she told me "NO HELL YOU DON'T" then preceded to tell me that she had better here from [name] or [name] just as soon as their damn feet hit the floor here at the office on Monday. I told her she didn't need to cuss at me and she just hung up on me. I called just to check on her and let her know we had not forgotten her concerns. She was really rude to me over the phone.

## 2020-11-13 NOTE — Telephone Encounter (Signed)
Patient called and said [facility] does not have her pain medicine.  She wants to know if we can transfer it to another pharmacy.  I advised her we do not have a doctor in the office today and she will need to call back on Monday.   She said thank you and hung up.

## 2020-11-16 ENCOUNTER — Other Ambulatory Visit: Payer: Self-pay

## 2020-11-16 ENCOUNTER — Other Ambulatory Visit: Payer: Self-pay | Admitting: Orthopedic Surgery

## 2020-11-16 ENCOUNTER — Telehealth: Payer: Self-pay | Admitting: Orthopedic Surgery

## 2020-11-16 ENCOUNTER — Ambulatory Visit: Payer: 59 | Admitting: Family Medicine

## 2020-11-16 ENCOUNTER — Ambulatory Visit (HOSPITAL_COMMUNITY): Payer: 59

## 2020-11-16 DIAGNOSIS — M25562 Pain in left knee: Secondary | ICD-10-CM | POA: Diagnosis not present

## 2020-11-16 DIAGNOSIS — M25662 Stiffness of left knee, not elsewhere classified: Secondary | ICD-10-CM

## 2020-11-16 DIAGNOSIS — M6281 Muscle weakness (generalized): Secondary | ICD-10-CM

## 2020-11-16 DIAGNOSIS — R2689 Other abnormalities of gait and mobility: Secondary | ICD-10-CM

## 2020-11-16 DIAGNOSIS — Z96652 Presence of left artificial knee joint: Secondary | ICD-10-CM

## 2020-11-16 MED ORDER — OXYCODONE-ACETAMINOPHEN 10-325 MG PO TABS: 1.0000 | ORAL_TABLET | ORAL | 0 refills | Status: AC | PRN

## 2020-11-16 NOTE — Telephone Encounter (Signed)
I called her, does not look like she was called since nothing documented, but patient states she was called at 5pm and told no one can help her here, since I am out.  She is very upset I have started the authorization, but have been told on cover my meds she can get it without an authorization.   I have left message at [facility] to call me back  To Abigail Butts and Dr Aline Brochure she is upset she ran out of meds and no one could help with authorization

## 2020-11-16 NOTE — Telephone Encounter (Signed)
I called patient and she has a new Rx from Dr Aline Brochure today.  I called pharmacy and they will fill it, as she will switch to this one vs the oxycontin from last week.   I apologized for the mishap in [place] not taking care of the PA last week for her.  She will pick up today's Rx from [facility], and will call with any other issues.

## 2020-11-16 NOTE — Telephone Encounter (Signed)
Discussed with patient her inability to get her medication refilled

## 2020-11-16 NOTE — Therapy (Signed)
[place], [place], [address] Phone: [phone]   Fax:  [phone]  Physical Therapy Treatment  Patient Details  Name: [name] MRN: [id number] Date of Birth: 01/24/1971 Referring Provider (PT): Arther Abbott   Encounter Date: 11/16/2020   PT End of Session - 11/16/20 1600     Visit Number 4    Number of Visits 18    Date for PT Re-Evaluation 12/18/20    Authorization Type [employer]    Authorization - Visit Number 4    Authorization - Number of Visits 30    Progress Note Due on Visit 43    PT Start Time 1600    PT Stop Time [id number]    PT Time Calculation (min) 45 min    Activity Tolerance Patient tolerated treatment well    Behavior During Therapy [facility] for tasks assessed/performed             Past Medical History:  Diagnosis Date   Arthritis    Carpal tunnel syndrome, bilateral    Coronary artery disease    Diabetes mellitus without complication ([place])    no meds; diet controlled   Diverticulitis    Hyperlipidemia    Phreesia 08/10/2020   Hypertension    Hypertriglyceridemia    Myocardial infarction ([place])    Phreesia 08/10/2020   PONV (postoperative nausea and vomiting)    Sciatica     Past Surgical History:  Procedure Laterality Date   ablasion of uterus     CARDIAC CATHETERIZATION     CARPAL TUNNEL RELEASE  11/10/2010   Procedure: CARPAL TUNNEL RELEASE;  Surgeon: Arther Abbott, MD;  Location: [facility];  Service: Orthopedics;  Laterality: Right;   CHOLECYSTECTOMY     [facility]   COLON RESECTION N/A 04/08/2015   Procedure: LAPAROSCOPIC HAND ASSISTED PARTIAL COLECTOMY  CONVERTED TO OPEN AT [id number];  Surgeon: Aviva Signs, MD;  Location: [facility];  Service: General;  Laterality: N/A;   COLONOSCOPY N/A 03/28/2014   Procedure: COLONOSCOPY;  Surgeon: Rogene Houston, MD;  Location: [facility];  Service: Endoscopy;  Laterality: N/A;  730   CORONARY STENT INTERVENTION  04/07/2020   CORONARY  STENT INTERVENTION N/A 04/07/2020   Procedure: CORONARY STENT INTERVENTION;  Surgeon: Jettie Booze, MD;  Location: [facility];  Service: Cardiovascular;  Laterality: N/A;   JOINT REPLACEMENT N/A    Phreesia 08/10/2020   KNEE ARTHROSCOPY WITH DRILLING/MICROFRACTURE Left 10/20/2016   Procedure: LEFT KNEE ARTHROSCOPY WITH MICROFRACTURE;  Surgeon: Carole Civil, MD;  Location: [facility];  Service: Orthopedics;  Laterality: Left;   KNEE ARTHROSCOPY WITH DRILLING/MICROFRACTURE Left 04/27/2017   Procedure: KNEE ARTHROSCOPY WITH DRILLING/MICROFRACTURE;  Surgeon: Carole Civil, MD;  Location: [facility];  Service: Orthopedics;  Laterality: Left;   KNEE ARTHROSCOPY WITH OSTEOCHONDRAL DEFECT REPAIR Left 04/27/2017   Procedure: KNEE ARTHROSCOPY WITH OSTEOCHONDRAL DEFECT REPAIR Autograft;  Surgeon: Carole Civil, MD;  Location: [facility];  Service: Orthopedics;  Laterality: Left;   KNEE SURGERY     LEFT HEART CATH AND CORONARY ANGIOGRAPHY N/A 04/07/2020   Procedure: LEFT HEART CATH AND CORONARY ANGIOGRAPHY;  Surgeon: Jettie Booze, MD;  Location: [facility];  Service: Cardiovascular;  Laterality: N/A;   PARTIAL COLECTOMY N/A 04/08/2015   Procedure: PARTIAL COLECTOMY;  Surgeon: Aviva Signs, MD;  Location: [facility];  Service: General;  Laterality: N/A;   right knee arthroscopy     SMALL INTESTINE SURGERY N/A  Phreesia 08/10/2020   TOTAL KNEE ARTHROPLASTY Left 10/17/2017   Procedure: TOTAL KNEE ARTHROPLASTY;  Surgeon: Carole Civil, MD;  Location: [facility];  Service: Orthopedics;  Laterality: Left;  [place]    TOTAL KNEE REVISION Left 10/20/2020   Procedure: REVISION TIBIAL COMPONENT LEFT TOTAL KNEE;  Surgeon: Carole Civil, MD;  Location: [facility];  Service: Orthopedics;  Laterality: Left;   TUBAL LIGATION      There were no vitals filed for this visit.   Subjective Assessment - 11/16/20 1620     Subjective "Left knee gets stiff and sore. I exercise it  throughout the day"    Pertinent History MI, LT TKR 2019; HTN    Limitations Sitting;Lifting;Standing;Walking;House hold activities    How long can you sit comfortably? 1-five minutes    How long can you stand comfortably? 10 minutes    How long can you walk comfortably? walking with a cane and a walker  she has not walked greater than a minute or two at a time on her own.  With therapy she was walking for about five minutes.    Patient Stated Goals less pain, move better, walk better.    Currently in Pain? Yes    Pain Score 3     Pain Location Knee    Pain Orientation Left    Pain Descriptors / Indicators Aching;Sore    Pain Type Acute pain    Pain Onset 1 to 4 weeks ago                [facility] PT Assessment - 11/16/20 0001       Assessment   Medical Diagnosis Lt TKR revision    Referring Provider (PT) Arther Abbott    Onset Date/Surgical Date 10/20/20    Next MD Visit 12/02/20                           [facility] Adult PT Treatment/Exercise - 11/16/20 0001       Knee/Hip Exercises: Stretches   Quad Stretch Left;4 reps;30 seconds    Quad Stretch Limitations prone    Gastroc Stretch 3 reps;30 seconds    Gastroc Stretch Limitations slant board      Knee/Hip Exercises: Aerobic   Nustep 5 min level 1 for dynamic warm-up      Knee/Hip Exercises: Machines for Strengthening   Cybex Knee Flexion 4.5 plates 2x10      Knee/Hip Exercises: Standing   Heel Raises Both;2 sets;10 reps    Forward Lunges Left;2 sets;10 reps;3 seconds    Forward Lunges Limitations lunge stretch on 12" step    Gait Training resisted retro-forward walk 3 plates x 2 min      Knee/Hip Exercises: Supine   Quad Sets Strengthening;Left;3 sets;10 reps    Heel Slides AROM;Left;3 sets;10 reps    Knee Extension AROM;Left    Knee Extension Limitations 2   lacking   Knee Flexion AROM;Left    Knee Flexion Limitations 107                      PT Short Term Goals - 11/06/20 1134        PT SHORT TERM GOAL #1   Title Pt will be independent with HEP and perform consistently in order to decrease pain and maximize ROM.    Time 3    Period Weeks    Status New    Target Date 11/27/20  PT SHORT TERM GOAL #2   Title Pt will have improved L knee AROM from 4-120 deg to maximize gait.    Time 3    Period Weeks    Status New      PT SHORT TERM GOAL #3   Title PT pain in LT knee will be no greater than a 6/10 to allow pt to be waking only one time a night for better sleep.    Time 3    Period Weeks    Status New      PT SHORT TERM GOAL #4   Title Pt will have 1/2 grade improvement in MMT throughout in order to maximize gait and functional mobility.    Time 3    Period Weeks    Status New               PT Long Term Goals - 11/06/20 1136       PT LONG TERM GOAL #1   Title Pt will have improved L knee AROM from 0-125deg in order to further maximize gait as well as sitting tolerance and stair ambulation.    Time 6    Period Weeks    Status New    Target Date 12/18/20      PT LONG TERM GOAL #2   Title Pt will be able to perform 5xSTS in 10 sec or < with no UE and mechanics WFL to demo improved balance and functional strength.    Time 6    Period Weeks    Status New      PT LONG TERM GOAL #3   Title Pt will be able to perform L SLS for 30 sec or > to demo improved balance and functional strength in order to maximize gait and stair ambulation.    Time 6    Period Weeks    Status New      PT LONG TERM GOAL #4   Title Pt will be able to ambulate 629f during 3MWT with LRAD and gait WFL in order to maximize return to PLOF.    Time 6    Period Weeks    Status New      PT LONG TERM GOAL #5   Title Pt will have 1 grade improvement throughout BLE MMT in order to be able to go and down  up 4-8 steps in a reciprocal manner with one hand on railing    Time 6    Period Weeks    Status New      Additional Long Term Goals   Additional Long Term Goals  Yes      PT LONG TERM GOAL #6   Title PT pain in Lt knee to be no greater than a 3/10 to allow pt to be able to sleep throughout the night    Time 6    Period Weeks    Status New                   Plan - 11/16/20 1641     Clinical Impression Statement Tolerated tx session well without adverse effects. Able to progress resisted activities without incident and demonstrating improved left knee ROM with improved activity tolerance. Continued sessions indicated to improve left knee ROM/strength and normalize gait pattern to decrease need for AD.    Personal Factors and Comorbidities Past/Current Experience;Comorbidity 2    Comorbidities Lt TKR in 2018/ MI in December 2021    Examination-Activity Limitations Bathing;Bend;Carry;Caring for Others;Dressing;Hygiene/Grooming;Lift;Locomotion Level;Toileting;Stand;Stairs;Squat;Sleep;Sit  Examination-Participation Restrictions Church;Cleaning;Community Activity;Driving;Laundry;Meal Prep;Occupation;Shop;Yard Work    Stability/Clinical Decision Making Stable/Uncomplicated    Rehab Potential Good    PT Frequency 3x / week    PT Duration 6 weeks    PT Treatment/Interventions Therapeutic exercise;Therapeutic activities;Patient/family education;Passive range of motion;Balance training;Manual techniques    PT Next Visit Plan Manual for pain reduction and edema control, ROM and strengthening of Lt quad and hip flexors. Try bike    PT Home Exercise Plan Eval:  sitting: LAQ, heelslide, supine quad set, heel slide and SLR             Patient will benefit from skilled therapeutic intervention in order to improve the following deficits and impairments:  Abnormal gait, Decreased activity tolerance, Decreased balance, Decreased mobility, Difficulty walking, Increased edema, Increased fascial restricitons, Pain, Decreased range of motion, Decreased strength  Visit Diagnosis: Stiffness of left knee, not elsewhere classified  Acute pain of left  knee  Other abnormalities of gait and mobility  Muscle weakness (generalized)     Problem List Patient Active Problem List   Diagnosis Date Noted   S/P revision of total knee, 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)    Chronic pain in left foot 08/31/2020   Coronary artery disease    Diabetes mellitus without complication (HCC)    Hypertension    Hypertriglyceridemia    Non-ST elevation (NSTEMI) myocardial infarction ([place])    Chest pain 04/06/2020   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    Chondral defect of condyle of right femur    S/P left knee arthroscopy 04/27/17 03/28/2017   S/P total knee replacement, 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    Abdominal pain 02/17/2015   Nausea without vomiting 02/17/2015   Diverticulitis of intestine with abscess 01/08/2015   Acute diverticulitis 01/08/2015   Nausea 12/31/2014   Diverticulitis 12/31/2014   Hyperglycemia 12/31/2014   Medication reaction 12/31/2014   Diverticulitis of large intestine without perforation or abscess without bleeding    Thrush, oral    Fatty liver 03/11/2014   Family hx of colon cancer requiring screening colonoscopy 03/11/2014   Rt flank pain 03/11/2014   Ulnar nerve compression 06/14/2011   Compartment syndrome, nontraumatic 06/08/2011   LOW BACK PAIN 11/07/2007   SCIATICA 11/07/2007   LUMBAR SPASM 11/07/2007   4:44 PM, 11/16/20 M. Sherlyn Lees, PT, DPT Physical Therapist- [place] Office Number: [phone]   [facility] [address] [place], [place], [address] Phone: [phone]   Fax:  [phone]  Name: [name] MRN: [id number] Date of Birth: [birth year]

## 2020-11-16 NOTE — Telephone Encounter (Signed)
Apology for office handling of incident

## 2020-11-17 NOTE — Telephone Encounter (Signed)
Noted  

## 2020-11-19 ENCOUNTER — Ambulatory Visit (HOSPITAL_COMMUNITY): Payer: 59 | Admitting: Physical Therapy

## 2020-11-19 ENCOUNTER — Other Ambulatory Visit: Payer: Self-pay

## 2020-11-19 DIAGNOSIS — M25562 Pain in left knee: Secondary | ICD-10-CM | POA: Diagnosis not present

## 2020-11-19 DIAGNOSIS — M25662 Stiffness of left knee, not elsewhere classified: Secondary | ICD-10-CM

## 2020-11-19 DIAGNOSIS — M6281 Muscle weakness (generalized): Secondary | ICD-10-CM

## 2020-11-19 DIAGNOSIS — R2689 Other abnormalities of gait and mobility: Secondary | ICD-10-CM

## 2020-11-19 NOTE — Therapy (Signed)
[place], [place], [address] Phone: [phone]   Fax:  [phone]  Physical Therapy Treatment  Patient Details  Name: [name] MRN: [id number] Date of Birth: February 02, 1971 Referring Provider (PT): Arther Abbott   Encounter Date: 11/19/2020   PT End of Session - 11/19/20 1621     Visit Number 5    Number of Visits 18    Date for PT Re-Evaluation 12/18/20    Authorization Type [employer]    Authorization - Visit Number 5    Authorization - Number of Visits 30    Progress Note Due on Visit 91    PT Start Time 1534    PT Stop Time 1618    PT Time Calculation (min) 44 min    Activity Tolerance Patient tolerated treatment well    Behavior During Therapy [facility] for tasks assessed/performed             Past Medical History:  Diagnosis Date   Arthritis    Carpal tunnel syndrome, bilateral    Coronary artery disease    Diabetes mellitus without complication ([place])    no meds; diet controlled   Diverticulitis    Hyperlipidemia    Phreesia 08/10/2020   Hypertension    Hypertriglyceridemia    Myocardial infarction ([place])    Phreesia 08/10/2020   PONV (postoperative nausea and vomiting)    Sciatica     Past Surgical History:  Procedure Laterality Date   ablasion of uterus     CARDIAC CATHETERIZATION     CARPAL TUNNEL RELEASE  11/10/2010   Procedure: CARPAL TUNNEL RELEASE;  Surgeon: Arther Abbott, MD;  [facility]: [facility];  Service: Orthopedics;  Laterality: Right;   CHOLECYSTECTOMY     [facility]   COLON RESECTION N/A 04/08/2015   Procedure: LAPAROSCOPIC HAND ASSISTED PARTIAL COLECTOMY  CONVERTED TO OPEN AT [id number];  Surgeon: Aviva Signs, MD;  Location: [facility];  Service: General;  Laterality: N/A;   COLONOSCOPY N/A 03/28/2014   Procedure: COLONOSCOPY;  Surgeon: Rogene Houston, MD;  [facility];  Service: Endoscopy;  Laterality: N/A;  730   CORONARY STENT INTERVENTION  04/07/2020   CORONARY  STENT INTERVENTION N/A 04/07/2020   Procedure: CORONARY STENT INTERVENTION;  Surgeon: Jettie Booze, MD;  Location: [place] [facility];  Service: Cardiovascular;  Laterality: N/A;   JOINT REPLACEMENT N/A    Phreesia 08/10/2020   KNEE ARTHROSCOPY WITH DRILLING/MICROFRACTURE Left 10/20/2016   Procedure: LEFT KNEE ARTHROSCOPY WITH MICROFRACTURE;  Surgeon: Carole Civil, MD;  Location: [facility];  Service: Orthopedics;  Laterality: Left;   KNEE ARTHROSCOPY WITH DRILLING/MICROFRACTURE Left 04/27/2017   Procedure: KNEE ARTHROSCOPY WITH DRILLING/MICROFRACTURE;  Surgeon: Carole Civil, MD;  [facility];  Service: Orthopedics;  Laterality: Left;   KNEE ARTHROSCOPY WITH OSTEOCHONDRAL DEFECT REPAIR Left 04/27/2017   Procedure: KNEE ARTHROSCOPY WITH OSTEOCHONDRAL DEFECT REPAIR Autograft;  Surgeon: Carole Civil, MD;  [facility];  Service: Orthopedics;  Laterality: Left;   KNEE SURGERY     LEFT HEART CATH AND CORONARY ANGIOGRAPHY N/A 04/07/2020   Procedure: LEFT HEART CATH AND CORONARY ANGIOGRAPHY;  Surgeon: Jettie Booze, MD;  Location: [place] [facility];  Service: Cardiovascular;  Laterality: N/A;   PARTIAL COLECTOMY N/A 04/08/2015   Procedure: PARTIAL COLECTOMY;  Surgeon: Aviva Signs, MD;  [facility];  Service: General;  Laterality: N/A;   right knee arthroscopy     SMALL INTESTINE SURGERY N/A  Phreesia 08/10/2020   TOTAL KNEE ARTHROPLASTY Left 10/17/2017   Procedure: TOTAL KNEE ARTHROPLASTY;  Surgeon: Carole Civil, MD;  [facility];  Service: Orthopedics;  Laterality: Left;  DePuy    TOTAL KNEE REVISION Left 10/20/2020   Procedure: REVISION TIBIAL COMPONENT LEFT TOTAL KNEE;  Surgeon: Carole Civil, MD;  [facility];  Service: Orthopedics;  Laterality: Left;   TUBAL LIGATION      There were no vitals filed for this visit.   Subjective Assessment - 11/19/20 1538     Subjective pt reports 2/10 pain today.    Currently in Pain?  Yes    Pain Score 2     Pain Location Knee    Pain Orientation Left    Pain Descriptors / Indicators Aching                               [facility] Adult PT Treatment/Exercise - 11/19/20 0001       Knee/Hip Exercises: Stretches   Sports administrator Left;4 reps;30 seconds    Quad Stretch Limitations standing 12" step    Knee: Self-Stretch to increase Flexion Left;10 seconds    Knee: Self-Stretch Limitations 10 times onto 12" step    Gastroc Stretch 3 reps;30 seconds    Gastroc Stretch Limitations slant board      Knee/Hip Exercises: Aerobic   Stationary Bike 5 minutes seat at 10 full revolutions      Knee/Hip Exercises: Standing   Knee Flexion Left;15 reps    Lateral Step Up Left;10 reps;Hand Hold: 1;Step Height: 4"    Lateral Step Up Limitations eccentric control    Forward Step Up Left;10 reps;Hand Hold: 1;Step Height: 4"    Forward Step Up Limitations power ups      Knee/Hip Exercises: Seated   Long Arc Quad Left;15 reps    Long Arc Quad Limitations 5 second holds      Knee/Hip Exercises: Supine   Quad Sets Strengthening;Left;3 sets;10 reps    Heel Slides AROM;Left;3 sets;10 reps    Knee Extension AROM;Left    Knee Extension Limitations 2    Knee Flexion AROM;Left    Knee Flexion Limitations 118      Manual Therapy   Manual Therapy Soft tissue mobilization    Manual therapy comments done seperate from all other aspects of treatment    Soft tissue mobilization scar tissue mobilization                    PT Education - 11/19/20 1620     Education Details scar massage    Person(s) Educated Patient    Methods Explanation;Demonstration    Comprehension Verbalized understanding              PT Short Term Goals - 11/06/20 1134       PT SHORT TERM GOAL #1   Title Pt will be independent with HEP and perform consistently in order to decrease pain and maximize ROM.    Time 3    Period Weeks    Status New    Target Date 11/27/20      PT  SHORT TERM GOAL #2   Title Pt will have improved L knee AROM from 4-120 deg to maximize gait.    Time 3    Period Weeks    Status New      PT SHORT TERM GOAL #3   Title PT pain in LT  knee will be no greater than a 6/10 to allow pt to be waking only one time a night for better sleep.    Time 3    Period Weeks    Status New      PT SHORT TERM GOAL #4   Title Pt will have 1/2 grade improvement in MMT throughout in order to maximize gait and functional mobility.    Time 3    Period Weeks    Status New               PT Long Term Goals - 11/06/20 1136       PT LONG TERM GOAL #1   Title Pt will have improved L knee AROM from 0-125deg in order to further maximize gait as well as sitting tolerance and stair ambulation.    Time 6    Period Weeks    Status New    Target Date 12/18/20      PT LONG TERM GOAL #2   Title Pt will be able to perform 5xSTS in 10 sec or < with no UE and mechanics WFL to demo improved balance and functional strength.    Time 6    Period Weeks    Status New      PT LONG TERM GOAL #3   Title Pt will be able to perform L SLS for 30 sec or > to demo improved balance and functional strength in order to maximize gait and stair ambulation.    Time 6    Period Weeks    Status New      PT LONG TERM GOAL #4   Title Pt will be able to ambulate 669f during 3MWT with LRAD and gait WFL in order to maximize return to PLOF.    Time 6    Period Weeks    Status New      PT LONG TERM GOAL #5   Title Pt will have 1 grade improvement throughout BLE MMT in order to be able to go and down  up 4-8 steps in a reciprocal manner with one hand on railing    Time 6    Period Weeks    Status New      Additional Long Term Goals   Additional Long Term Goals Yes      PT LONG TERM GOAL #6   Title PT pain in Lt knee to be no greater than a 3/10 to allow pt to be able to sleep throughout the night    Time 6    Period Weeks    Status New                   Plan  - 11/19/20 1619     Clinical Impression Statement Began with bike to work on ROM/warm up.  Stretches completed for both knee flexion and extension.  Lt quad weak as noted with added step ups with poor eccentric control.  Focused on completing slowly and controlled with less assistance form Rt LE when lowering.  Educated on scar massage and encouraged to do this at home. Several areas where adhesions loosened with manual.   Still lacking 2 degrees from full extension and able to bend knee 118 degrees today following manual measured in supine.    Personal Factors and Comorbidities Past/Current Experience;Comorbidity 2    Comorbidities Lt TKR in 2018/ MI in December 2021    Examination-Activity Limitations Bathing;Bend;Carry;Caring for Others;Dressing;Hygiene/Grooming;Lift;Locomotion Level;Toileting;Stand;Stairs;Squat;Sleep;Sit    Examination-Participation Restrictions Church;Cleaning;Community Activity;Driving;Laundry;Meal  Prep;Occupation;Shop;Yard Work    Stability/Clinical Decision Making Stable/Uncomplicated    Rehab Potential Good    PT Frequency 3x / week    PT Duration 6 weeks    PT Treatment/Interventions Therapeutic exercise;Therapeutic activities;Patient/family education;Passive range of motion;Balance training;Manual techniques    PT Next Visit Plan Manual for scar reduction and quad strengthening working on eccentric control.   Progress to ambualtion without AD when quad stronger.    PT Home Exercise Plan Eval:  sitting: LAQ, heelslide, supine quad set, heel slide and SLR             Patient will benefit from skilled therapeutic intervention in order to improve the following deficits and impairments:  Abnormal gait, Decreased activity tolerance, Decreased balance, Decreased mobility, Difficulty walking, Increased edema, Increased fascial restricitons, Pain, Decreased range of motion, Decreased strength  Visit Diagnosis: Stiffness of left knee, not elsewhere classified  Acute pain  of left knee  Other abnormalities of gait and mobility  Muscle weakness (generalized)     Problem List Patient Active Problem List   Diagnosis Date Noted   S/P revision of total knee, 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)    Chronic pain in left foot 08/31/2020   Coronary artery disease    Diabetes mellitus without complication (HCC)    Hypertension    Hypertriglyceridemia    Non-ST elevation (NSTEMI) myocardial infarction ([place])    Chest pain 04/06/2020   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    Chondral defect of condyle of right femur    S/P left knee arthroscopy 04/27/17 03/28/2017   S/P total knee replacement, 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    Abdominal pain 02/17/2015   Nausea without vomiting 02/17/2015   Diverticulitis of intestine with abscess 01/08/2015   Acute diverticulitis 01/08/2015   Nausea 12/31/2014   Diverticulitis 12/31/2014   Hyperglycemia 12/31/2014   Medication reaction 12/31/2014   Diverticulitis of large intestine without perforation or abscess without bleeding    Thrush, oral    Fatty liver 03/11/2014   Family hx of colon cancer requiring screening colonoscopy 03/11/2014   Rt flank pain 03/11/2014   Ulnar nerve compression 06/14/2011   Compartment syndrome, nontraumatic 06/08/2011   LOW BACK PAIN 11/07/2007   SCIATICA 11/07/2007   LUMBAR SPASM 11/07/2007   [name], PTA/CLT [phone]  [name] 11/19/2020, 4:22 PM  [place] [place], [place], [address] Phone: [phone]   Fax:  [phone]  Name: [name] MRN: [id number] Date of Birth: October 08, 1970

## 2020-11-20 ENCOUNTER — Ambulatory Visit (HOSPITAL_COMMUNITY): Payer: 59

## 2020-11-20 DIAGNOSIS — R2689 Other abnormalities of gait and mobility: Secondary | ICD-10-CM

## 2020-11-20 DIAGNOSIS — M25662 Stiffness of left knee, not elsewhere classified: Secondary | ICD-10-CM

## 2020-11-20 DIAGNOSIS — M25562 Pain in left knee: Secondary | ICD-10-CM | POA: Diagnosis not present

## 2020-11-20 DIAGNOSIS — M6281 Muscle weakness (generalized): Secondary | ICD-10-CM

## 2020-11-20 NOTE — Therapy (Signed)
[place], [place], [address] Phone: [phone]   Fax:  [phone]  Physical Therapy Treatment  Patient Details  Name: [name] MRN: [id number] Date of Birth: 04-24-1971 Referring Provider (PT): Arther Abbott   Encounter Date: 11/20/2020   PT End of Session - 11/20/20 1531     Visit Number 6    Number of Visits 18    Date for PT Re-Evaluation 12/18/20    Authorization Type [employer]    Authorization - Visit Number 6    Authorization - Number of Visits 30    Progress Note Due on Visit 86    PT Start Time [id number]    PT Stop Time 1600    PT Time Calculation (min) 45 min    Activity Tolerance Patient tolerated treatment well    Behavior During Therapy [facility] for tasks assessed/performed             Past Medical History:  Diagnosis Date   Arthritis    Carpal tunnel syndrome, bilateral    Coronary artery disease    Diabetes mellitus without complication ([place])    no meds; diet controlled   Diverticulitis    Hyperlipidemia    Phreesia 08/10/2020   Hypertension    Hypertriglyceridemia    Myocardial infarction ([place])    Phreesia 08/10/2020   PONV (postoperative nausea and vomiting)    Sciatica     Past Surgical History:  Procedure Laterality Date   ablasion of uterus     CARDIAC CATHETERIZATION     CARPAL TUNNEL RELEASE  11/10/2010   Procedure: CARPAL TUNNEL RELEASE;  Surgeon: Arther Abbott, MD;  [facility];  Service: Orthopedics;  Laterality: Right;   CHOLECYSTECTOMY     [facility]   COLON RESECTION N/A 04/08/2015   Procedure: LAPAROSCOPIC HAND ASSISTED PARTIAL COLECTOMY  CONVERTED TO OPEN AT [id number];  Surgeon: Aviva Signs, MD;  Location: [facility];  Service: General;  Laterality: N/A;   COLONOSCOPY N/A 03/28/2014   Procedure: COLONOSCOPY;  Surgeon: Rogene Houston, MD;  [facility];  Service: Endoscopy;  Laterality: N/A;  730   CORONARY STENT INTERVENTION  04/07/2020   CORONARY  STENT INTERVENTION N/A 04/07/2020   Procedure: CORONARY STENT INTERVENTION;  Surgeon: Jettie Booze, MD;  Location: [place] [facility];  Service: Cardiovascular;  Laterality: N/A;   JOINT REPLACEMENT N/A    Phreesia 08/10/2020   KNEE ARTHROSCOPY WITH DRILLING/MICROFRACTURE Left 10/20/2016   Procedure: LEFT KNEE ARTHROSCOPY WITH MICROFRACTURE;  Surgeon: Carole Civil, MD;  [facility];  Service: Orthopedics;  Laterality: Left;   KNEE ARTHROSCOPY WITH DRILLING/MICROFRACTURE Left 04/27/2017   Procedure: KNEE ARTHROSCOPY WITH DRILLING/MICROFRACTURE;  Surgeon: Carole Civil, MD;  [facility]: [facility];  Service: Orthopedics;  Laterality: Left;   KNEE ARTHROSCOPY WITH OSTEOCHONDRAL DEFECT REPAIR Left 04/27/2017   Procedure: KNEE ARTHROSCOPY WITH OSTEOCHONDRAL DEFECT REPAIR Autograft;  Surgeon: Carole Civil, MD;  [facility]: [facility];  Service: Orthopedics;  Laterality: Left;   KNEE SURGERY     LEFT HEART CATH AND CORONARY ANGIOGRAPHY N/A 04/07/2020   Procedure: LEFT HEART CATH AND CORONARY ANGIOGRAPHY;  Surgeon: Jettie Booze, MD;  Location: [place] [facility];  Service: Cardiovascular;  Laterality: N/A;   PARTIAL COLECTOMY N/A 04/08/2015   Procedure: PARTIAL COLECTOMY;  Surgeon: Aviva Signs, MD;  [facility]: [facility];  Service: General;  Laterality: N/A;   right knee arthroscopy     SMALL INTESTINE SURGERY N/A  Phreesia 08/10/2020   TOTAL KNEE ARTHROPLASTY Left 10/17/2017   Procedure: TOTAL KNEE ARTHROPLASTY;  Surgeon: Carole Civil, MD;  [facility];  Service: Orthopedics;  Laterality: Left;  DePuy    TOTAL KNEE REVISION Left 10/20/2020   Procedure: REVISION TIBIAL COMPONENT LEFT TOTAL KNEE;  Surgeon: Carole Civil, MD;  [facility];  Service: Orthopedics;  Laterality: Left;   TUBAL LIGATION      There were no vitals filed for this visit.   Subjective Assessment - 11/20/20 1531     Subjective No issues.    Pertinent History MI, LT TKR 2019;  HTN    Limitations Sitting;Lifting;Standing;Walking;House hold activities    How long can you sit comfortably? 1-five minutes    How long can you stand comfortably? 10 minutes    How long can you walk comfortably? walking with a cane and a walker  she has not walked greater than a minute or two at a time on her own.  With therapy she was walking for about five minutes.    Patient Stated Goals less pain, move better, walk better.    Currently in Pain? Yes    Pain Score 2     Pain Location Knee    Pain Orientation Left    Pain Descriptors / Indicators Aching    Pain Type Acute pain                [facility] PT Assessment - 11/20/20 0001       Assessment   Medical Diagnosis Lt TKR revision    Referring Provider (PT) Arther Abbott    Onset Date/Surgical Date 10/20/20    Next MD Visit 12/02/20                           [place] Adult PT Treatment/Exercise - 11/20/20 0001       Knee/Hip Exercises: Aerobic   Stationary Bike 5 minutes seat at 10 full revolutions      Knee/Hip Exercises: Machines for Strengthening   Cybex Knee Flexion 5 plates 3x10      Knee/Hip Exercises: Standing   Knee Flexion Strengthening;Left;3 sets;10 reps    Knee Flexion Limitations 10#    Lateral Step Up Left;Hand Hold: 2;Step Height: 4";2 sets;10 reps    Lateral Step Up Limitations eccentric control    Forward Step Up Left;2 sets;10 reps;Step Height: 6"    Step Down Left;2 sets;10 reps;Step Height: 2";Hand Hold: 2      Knee/Hip Exercises: Seated   Long Arc Quad Strengthening;Left;3 sets;10 reps    Long Arc Quad Weight 10 lbs.    Long [employer] Limitations performed as eccentric                      PT Short Term Goals - 11/06/20 1134       PT SHORT TERM GOAL #1   Title Pt will be independent with HEP and perform consistently in order to decrease pain and maximize ROM.    Time 3    Period Weeks    Status New    Target Date 11/27/20      PT SHORT TERM GOAL #2    Title Pt will have improved L knee AROM from 4-120 deg to maximize gait.    Time 3    Period Weeks    Status New      PT SHORT TERM GOAL #3   Title PT pain  in LT knee will be no greater than a 6/10 to allow pt to be waking only one time a night for better sleep.    Time 3    Period Weeks    Status New      PT SHORT TERM GOAL #4   Title Pt will have 1/2 grade improvement in MMT throughout in order to maximize gait and functional mobility.    Time 3    Period Weeks    Status New               PT Long Term Goals - 11/06/20 1136       PT LONG TERM GOAL #1   Title Pt will have improved L knee AROM from 0-125deg in order to further maximize gait as well as sitting tolerance and stair ambulation.    Time 6    Period Weeks    Status New    Target Date 12/18/20      PT LONG TERM GOAL #2   Title Pt will be able to perform 5xSTS in 10 sec or < with no UE and mechanics WFL to demo improved balance and functional strength.    Time 6    Period Weeks    Status New      PT LONG TERM GOAL #3   Title Pt will be able to perform L SLS for 30 sec or > to demo improved balance and functional strength in order to maximize gait and stair ambulation.    Time 6    Period Weeks    Status New      PT LONG TERM GOAL #4   Title Pt will be able to ambulate 666f during 3MWT with LRAD and gait WFL in order to maximize return to PLOF.    Time 6    Period Weeks    Status New      PT LONG TERM GOAL #5   Title Pt will have 1 grade improvement throughout BLE MMT in order to be able to go and down  up 4-8 steps in a reciprocal manner with one hand on railing    Time 6    Period Weeks    Status New      Additional Long Term Goals   Additional Long Term Goals Yes      PT LONG TERM GOAL #6   Title PT pain in Lt knee to be no greater than a 3/10 to allow pt to be able to sleep throughout the night    Time 6    Period Weeks    Status New                   Plan - 11/20/20 1546      Clinical Impression Statement Demonstrates poor left quad eccentric control. Improved control with therapist performing manual counter-pressure for tibial advancement during lateral step down manuever to improve eccentric control. Continued sessions indicated to improve LLE ROM and strength to facilitate normalized gait and eccentric control for safe stair negotiation    Personal Factors and Comorbidities Past/Current Experience;Comorbidity 2    Comorbidities Lt TKR in 2018/ MI in December 2021    Examination-Activity Limitations Bathing;Bend;Carry;Caring for Others;Dressing;Hygiene/Grooming;Lift;Locomotion Level;Toileting;Stand;Stairs;Squat;Sleep;Sit    Examination-Participation Restrictions Church;Cleaning;Community Activity;Driving;Laundry;Meal Prep;Occupation;Shop;Yard Work    Stability/Clinical Decision Making Stable/Uncomplicated    Rehab Potential Good    PT Frequency 3x / week    PT Duration 6 weeks    PT Treatment/Interventions Therapeutic exercise;Therapeutic activities;Patient/family education;Passive range  of motion;Balance training;Manual techniques    PT Next Visit Plan Manual for scar reduction and quad strengthening working on eccentric control.   Progress to ambualtion without AD when quad stronger.    PT Home Exercise Plan Eval:  sitting: LAQ, heelslide, supine quad set, heel slide and SLR             Patient will benefit from skilled therapeutic intervention in order to improve the following deficits and impairments:  Abnormal gait, Decreased activity tolerance, Decreased balance, Decreased mobility, Difficulty walking, Increased edema, Increased fascial restricitons, Pain, Decreased range of motion, Decreased strength  Visit Diagnosis: Stiffness of left knee, not elsewhere classified  Acute pain of left knee  Other abnormalities of gait and mobility  Muscle weakness (generalized)     Problem List Patient Active Problem List   Diagnosis Date Noted   S/P revision  of total knee, 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)    Chronic pain in left foot 08/31/2020   Coronary artery disease    Diabetes mellitus without complication (HCC)    Hypertension    Hypertriglyceridemia    Non-ST elevation (NSTEMI) myocardial infarction ([place])    Chest pain 04/06/2020   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    Chondral defect of condyle of right femur    S/P left knee arthroscopy 04/27/17 03/28/2017   S/P total knee replacement, 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    Abdominal pain 02/17/2015   Nausea without vomiting 02/17/2015   Diverticulitis of intestine with abscess 01/08/2015   Acute diverticulitis 01/08/2015   Nausea 12/31/2014   Diverticulitis 12/31/2014   Hyperglycemia 12/31/2014   Medication reaction 12/31/2014   Diverticulitis of large intestine without perforation or abscess without bleeding    Thrush, oral    Fatty liver 03/11/2014   Family hx of colon cancer requiring screening colonoscopy 03/11/2014   Rt flank pain 03/11/2014   Ulnar nerve compression 06/14/2011   Compartment syndrome, nontraumatic 06/08/2011   LOW BACK PAIN 11/07/2007   SCIATICA 11/07/2007   LUMBAR SPASM 11/07/2007   4:06 PM, 11/20/20 M. Sherlyn Lees, PT, DPT Physical Therapist- [place] Office Number: [phone]   [place] [address] [place], [place], [address] Phone: [phone]   Fax:  [phone]  Name: [name] MRN: [id number] Date of Birth: [birth year]

## 2020-11-23 ENCOUNTER — Ambulatory Visit (HOSPITAL_COMMUNITY): Payer: 59 | Attending: Orthopedic Surgery | Admitting: Physical Therapy

## 2020-11-23 ENCOUNTER — Other Ambulatory Visit: Payer: Self-pay

## 2020-11-23 DIAGNOSIS — M25562 Pain in left knee: Secondary | ICD-10-CM | POA: Diagnosis present

## 2020-11-23 DIAGNOSIS — M25662 Stiffness of left knee, not elsewhere classified: Secondary | ICD-10-CM | POA: Diagnosis not present

## 2020-11-23 DIAGNOSIS — M6281 Muscle weakness (generalized): Secondary | ICD-10-CM | POA: Insufficient documentation

## 2020-11-23 DIAGNOSIS — R2689 Other abnormalities of gait and mobility: Secondary | ICD-10-CM | POA: Diagnosis present

## 2020-11-23 NOTE — Therapy (Signed)
[place], [place], [address] Phone: [phone]   Fax:  [phone]  Physical Therapy Treatment  Patient Details  Name: [name] MRN: [id number] Date of Birth: 1971-01-23 Referring Provider (PT): Arther Abbott   Encounter Date: 11/23/2020   PT End of Session - 11/23/20 1109     Visit Number 7    Number of Visits 18    Date for PT Re-Evaluation 12/18/20    Authorization Type [employer]    Authorization - Visit Number 7    Authorization - Number of Visits 30    Progress Note Due on Visit 57    PT Start Time 1048    PT Stop Time 1130    PT Time Calculation (min) 42 min    Activity Tolerance Patient tolerated treatment well    Behavior During Therapy [facility] for tasks assessed/performed             Past Medical History:  Diagnosis Date   Arthritis    Carpal tunnel syndrome, bilateral    Coronary artery disease    Diabetes mellitus without complication ([place])    no meds; diet controlled   Diverticulitis    Hyperlipidemia    Phreesia 08/10/2020   Hypertension    Hypertriglyceridemia    Myocardial infarction ([place])    Phreesia 08/10/2020   PONV (postoperative nausea and vomiting)    Sciatica     Past Surgical History:  Procedure Laterality Date   ablasion of uterus     CARDIAC CATHETERIZATION     CARPAL TUNNEL RELEASE  11/10/2010   Procedure: CARPAL TUNNEL RELEASE;  Surgeon: Arther Abbott, MD;  Location: [facility];  Service: Orthopedics;  Laterality: Right;   CHOLECYSTECTOMY     APH   COLON RESECTION N/A 04/08/2015   Procedure: LAPAROSCOPIC HAND ASSISTED PARTIAL COLECTOMY  CONVERTED TO OPEN AT [id number];  Surgeon: Aviva Signs, MD;  Location: [facility];  Service: General;  Laterality: N/A;   COLONOSCOPY N/A 03/28/2014   Procedure: COLONOSCOPY;  Surgeon: Rogene Houston, MD;  Location: [facility];  Service: Endoscopy;  Laterality: N/A;  730   CORONARY STENT INTERVENTION  04/07/2020   CORONARY  STENT INTERVENTION N/A 04/07/2020   Procedure: CORONARY STENT INTERVENTION;  Surgeon: Jettie Booze, MD;  Location: [place] CV LAB;  Service: Cardiovascular;  Laterality: N/A;   JOINT REPLACEMENT N/A    Phreesia 08/10/2020   KNEE ARTHROSCOPY WITH DRILLING/MICROFRACTURE Left 10/20/2016   Procedure: LEFT KNEE ARTHROSCOPY WITH MICROFRACTURE;  Surgeon: Carole Civil, MD;  Location: [facility];  Service: Orthopedics;  Laterality: Left;   KNEE ARTHROSCOPY WITH DRILLING/MICROFRACTURE Left 04/27/2017   Procedure: KNEE ARTHROSCOPY WITH DRILLING/MICROFRACTURE;  Surgeon: Carole Civil, MD;  Location: [facility];  Service: Orthopedics;  Laterality: Left;   KNEE ARTHROSCOPY WITH OSTEOCHONDRAL DEFECT REPAIR Left 04/27/2017   Procedure: KNEE ARTHROSCOPY WITH OSTEOCHONDRAL DEFECT REPAIR Autograft;  Surgeon: Carole Civil, MD;  Location: [facility];  Service: Orthopedics;  Laterality: Left;   KNEE SURGERY     LEFT HEART CATH AND CORONARY ANGIOGRAPHY N/A 04/07/2020   Procedure: LEFT HEART CATH AND CORONARY ANGIOGRAPHY;  Surgeon: Jettie Booze, MD;  Location: [place] [facility];  Service: Cardiovascular;  Laterality: N/A;   PARTIAL COLECTOMY N/A 04/08/2015   Procedure: PARTIAL COLECTOMY;  Surgeon: Aviva Signs, MD;  Location: [facility];  Service: General;  Laterality: N/A;   right knee arthroscopy     SMALL INTESTINE SURGERY N/A  Phreesia 08/10/2020   TOTAL KNEE ARTHROPLASTY Left 10/17/2017   Procedure: TOTAL KNEE ARTHROPLASTY;  Surgeon: Carole Civil, MD;  Location: [facility];  Service: Orthopedics;  Laterality: Left;  DePuy    TOTAL KNEE REVISION Left 10/20/2020   Procedure: REVISION TIBIAL COMPONENT LEFT TOTAL KNEE;  Surgeon: Carole Civil, MD;  Location: [facility];  Service: Orthopedics;  Laterality: Left;   TUBAL LIGATION      There were no vitals filed for this visit.   Subjective Assessment - 11/23/20 1048     Subjective PT states that her knee was hurting so bad  yesterday that she didn't do a thing.    Pertinent History MI, LT TKR 2019; HTN    Limitations Sitting;Lifting;Standing;Walking;House hold activities    How long can you sit comfortably? 1-five minutes    How long can you stand comfortably? 10 minutes    How long can you walk comfortably? walking with a cane and a walker  she has not walked greater than a minute or two at a time on her own.  With therapy she was walking for about five minutes.    Patient Stated Goals less pain, move better, walk better.    Currently in Pain? Yes    Pain Score 5     Pain Location Knee    Pain Orientation Left    Pain Descriptors / Indicators Aching    Pain Type Acute pain    Pain Onset 1 to 4 weeks ago    Pain Frequency Intermittent    Aggravating Factors  activity    Pain Relieving Factors meds and ice    Effect of Pain on Daily Activities limits                               [facility] Adult PT Treatment/Exercise - 11/23/20 0001       Knee/Hip Exercises: Stretches   Active Hamstring Stretch Left;3 reps;20 seconds      Knee/Hip Exercises: Aerobic   Stationary Bike 5 minutes seat at 10 full revolutions      Knee/Hip Exercises: Seated   Long Arc Quad Left;10 reps    Heel Slides Left;10 reps      Knee/Hip Exercises: Supine   Knee Extension Limitations 2    Knee Flexion Limitations 117      Manual Therapy   Manual Therapy Edema management;Soft tissue mobilization;Joint mobilization    Manual therapy comments done seperate from all other aspects of treatment    Edema Management with decongestive techniques    Joint Mobilization patella mobilization    Soft tissue mobilization scar tissue mobilization; petrissage to ITB band which is extremely tight.                      PT Short Term Goals - 11/06/20 1134       PT SHORT TERM GOAL #1   Title Pt will be independent with HEP and perform consistently in order to decrease pain and maximize ROM.    Time 3    Period  Weeks    Status New    Target Date 11/27/20      PT SHORT TERM GOAL #2   Title Pt will have improved L knee AROM from 4-120 deg to maximize gait.    Time 3    Period Weeks    Status New      PT SHORT TERM GOAL #3  Title PT pain in LT knee will be no greater than a 6/10 to allow pt to be waking only one time a night for better sleep.    Time 3    Period Weeks    Status New      PT SHORT TERM GOAL #4   Title Pt will have 1/2 grade improvement in MMT throughout in order to maximize gait and functional mobility.    Time 3    Period Weeks    Status New               PT Long Term Goals - 11/06/20 1136       PT LONG TERM GOAL #1   Title Pt will have improved L knee AROM from 0-125deg in order to further maximize gait as well as sitting tolerance and stair ambulation.    Time 6    Period Weeks    Status New    Target Date 12/18/20      PT LONG TERM GOAL #2   Title Pt will be able to perform 5xSTS in 10 sec or < with no UE and mechanics WFL to demo improved balance and functional strength.    Time 6    Period Weeks    Status New      PT LONG TERM GOAL #3   Title Pt will be able to perform L SLS for 30 sec or > to demo improved balance and functional strength in order to maximize gait and stair ambulation.    Time 6    Period Weeks    Status New      PT LONG TERM GOAL #4   Title Pt will be able to ambulate 646f during 3MWT with LRAD and gait WFL in order to maximize return to PLOF.    Time 6    Period Weeks    Status New      PT LONG TERM GOAL #5   Title Pt will have 1 grade improvement throughout BLE MMT in order to be able to go and down  up 4-8 steps in a reciprocal manner with one hand on railing    Time 6    Period Weeks    Status New      Additional Long Term Goals   Additional Long Term Goals Yes      PT LONG TERM GOAL #6   Title PT pain in Lt knee to be no greater than a 3/10 to allow pt to be able to sleep throughout the night    Time 6    Period  Weeks    Status New                   Plan - 11/23/20 1109     Clinical Impression Statement PT comes to departement with significant increased pain.  Therapist began with manual noting extreme tight ITB band, decreased patellar mobility and increased adhesions at scar area.  Manual completed to address all of the above.  PT ROM remains good at 2-117; Pain decreased to a 3 after treatment    Personal Factors and Comorbidities Past/Current Experience;Comorbidity 2    Comorbidities Lt TKR in 2018/ MI in December 2021    Examination-Activity Limitations Bathing;Bend;Carry;Caring for Others;Dressing;Hygiene/Grooming;Lift;Locomotion Level;Toileting;Stand;Stairs;Squat;Sleep;Sit    Examination-Participation Restrictions Church;Cleaning;Community Activity;Driving;Laundry;Meal Prep;Occupation;Shop;Yard Work    Stability/Clinical Decision Making Stable/Uncomplicated    Clinical Decision Making Moderate    Rehab Potential Good    PT Frequency 3x / week  PT Treatment/Interventions Therapeutic exercise;Therapeutic activities;Patient/family education;Passive range of motion;Balance training;Manual techniques    PT Next Visit Plan Manual for scar reduction and quad strengthening working on eccentric control.   Progress to ambualtion without AD when quad stronger.             Patient will benefit from skilled therapeutic intervention in order to improve the following deficits and impairments:  Abnormal gait, Decreased activity tolerance, Decreased balance, Decreased mobility, Difficulty walking, Increased edema, Increased fascial restricitons, Pain, Decreased range of motion, Decreased strength  Visit Diagnosis: Stiffness of left knee, not elsewhere classified  Acute pain of left knee  Other abnormalities of gait and mobility  Muscle weakness (generalized)     Problem List Patient Active Problem List   Diagnosis Date Noted   S/P revision of total knee, 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)    Chronic pain in left foot 08/31/2020   Coronary artery disease    Diabetes mellitus without complication ([place])    Hypertension    Hypertriglyceridemia    Non-ST elevation (NSTEMI) myocardial infarction ([place])    Chest pain 04/06/2020   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    Chondral defect of condyle of right femur    S/P left knee arthroscopy 04/27/17 03/28/2017   S/P total knee replacement, 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    Abdominal pain 02/17/2015   Nausea without vomiting 02/17/2015   Diverticulitis of intestine with abscess 01/08/2015   Acute diverticulitis 01/08/2015   Nausea 12/31/2014   Diverticulitis 12/31/2014   Hyperglycemia 12/31/2014   Medication reaction 12/31/2014   Diverticulitis of large intestine without perforation or abscess without bleeding    Thrush, oral    Fatty liver 03/11/2014   Family hx of colon cancer requiring screening colonoscopy 03/11/2014   Rt flank pain 03/11/2014   Ulnar nerve compression 06/14/2011   Compartment syndrome, nontraumatic 06/08/2011   LOW BACK PAIN 11/07/2007   SCIATICA 11/07/2007   LUMBAR SPASM 11/07/2007   Rayetta Humphrey, PT CLT [phone]  11/23/2020, 11:27 AM  [facility] [address] [place], [place], [address] Phone: [phone]   Fax:  [phone]  Name: [name] MRN: [id number] Date of Birth: December 08, 1970

## 2020-11-25 ENCOUNTER — Other Ambulatory Visit: Payer: Self-pay | Admitting: Orthopedic Surgery

## 2020-11-25 ENCOUNTER — Ambulatory Visit (HOSPITAL_COMMUNITY): Payer: 59 | Admitting: Physical Therapy

## 2020-11-25 ENCOUNTER — Other Ambulatory Visit: Payer: Self-pay

## 2020-11-25 DIAGNOSIS — M25662 Stiffness of left knee, not elsewhere classified: Secondary | ICD-10-CM | POA: Diagnosis not present

## 2020-11-25 DIAGNOSIS — R2689 Other abnormalities of gait and mobility: Secondary | ICD-10-CM

## 2020-11-25 DIAGNOSIS — M6281 Muscle weakness (generalized): Secondary | ICD-10-CM

## 2020-11-25 DIAGNOSIS — M25562 Pain in left knee: Secondary | ICD-10-CM

## 2020-11-25 MED ORDER — TRAMADOL HCL 50 MG PO TABS: 50.0000 mg | ORAL_TABLET | Freq: Four times a day (QID) | ORAL | 0 refills | Status: DC | PRN

## 2020-11-25 MED ORDER — OXYCODONE HCL 15 MG PO TABS: 15.0000 mg | ORAL_TABLET | ORAL | 0 refills | Status: DC | PRN

## 2020-11-25 NOTE — Telephone Encounter (Signed)
Patient needs 2 medicine refilled   oxyCODONE (OXYCONTIN) 15 mg 12 hr tablet  traMADol (ULTRAM) 50 MG tablet  Pharmacy: [facility] in [place]

## 2020-11-25 NOTE — Therapy (Signed)
[facility] [address] [place], [place], [address] Phone: [phone]   Fax:  [phone]  Physical Therapy Treatment  Patient Details  Name: [name] MRN: [id number] Date of Birth: 01/27/1971 Referring Provider (PT): Arther Abbott   Encounter Date: 11/25/2020   PT End of Session - 11/25/20 1524     Visit Number 8    Number of Visits 18    Date for PT Re-Evaluation 12/18/20    Authorization Type bright health    Authorization - Visit Number 8    Authorization - Number of Visits 30    Progress Note Due on Visit 73    PT Start Time 1315    PT Stop Time 1400    PT Time Calculation (min) 45 min    Activity Tolerance Patient tolerated treatment well    Behavior During Therapy [facility] for tasks assessed/performed             Past Medical History:  Diagnosis Date   Arthritis    Carpal tunnel syndrome, bilateral    Coronary artery disease    Diabetes mellitus without complication ([place])    no meds; diet controlled   Diverticulitis    Hyperlipidemia    Phreesia 08/10/2020   Hypertension    Hypertriglyceridemia    Myocardial infarction ([place])    Phreesia 08/10/2020   PONV (postoperative nausea and vomiting)    Sciatica     Past Surgical History:  Procedure Laterality Date   ablasion of uterus     CARDIAC CATHETERIZATION     CARPAL TUNNEL RELEASE  11/10/2010   Procedure: CARPAL TUNNEL RELEASE;  Surgeon: Arther Abbott, MD;  Location: [facility];  Service: Orthopedics;  Laterality: Right;   CHOLECYSTECTOMY     APH   COLON RESECTION N/A 04/08/2015   Procedure: LAPAROSCOPIC HAND ASSISTED PARTIAL COLECTOMY  CONVERTED TO OPEN AT [id number];  Surgeon: Aviva Signs, MD;  Location: [facility];  Service: General;  Laterality: N/A;   COLONOSCOPY N/A 03/28/2014   Procedure: COLONOSCOPY;  Surgeon: Rogene Houston, MD;  [facility];  Service: Endoscopy;  Laterality: N/A;  730   CORONARY STENT INTERVENTION  04/07/2020   CORONARY  STENT INTERVENTION N/A 04/07/2020   Procedure: CORONARY STENT INTERVENTION;  Surgeon: Jettie Booze, MD;  Location: [place] CV LAB;  Service: Cardiovascular;  Laterality: N/A;   JOINT REPLACEMENT N/A    Phreesia 08/10/2020   KNEE ARTHROSCOPY WITH DRILLING/MICROFRACTURE Left 10/20/2016   Procedure: LEFT KNEE ARTHROSCOPY WITH MICROFRACTURE;  Surgeon: Carole Civil, MD;  Location: [facility];  Service: Orthopedics;  Laterality: Left;   KNEE ARTHROSCOPY WITH DRILLING/MICROFRACTURE Left 04/27/2017   Procedure: KNEE ARTHROSCOPY WITH DRILLING/MICROFRACTURE;  Surgeon: Carole Civil, MD;  Location: [facility];  Service: Orthopedics;  Laterality: Left;   KNEE ARTHROSCOPY WITH OSTEOCHONDRAL DEFECT REPAIR Left 04/27/2017   Procedure: KNEE ARTHROSCOPY WITH OSTEOCHONDRAL DEFECT REPAIR Autograft;  Surgeon: Carole Civil, MD;  Location: [facility];  Service: Orthopedics;  Laterality: Left;   KNEE SURGERY     LEFT HEART CATH AND CORONARY ANGIOGRAPHY N/A 04/07/2020   Procedure: LEFT HEART CATH AND CORONARY ANGIOGRAPHY;  Surgeon: Jettie Booze, MD;  Location: [place] [facility];  Service: Cardiovascular;  Laterality: N/A;   PARTIAL COLECTOMY N/A 04/08/2015   Procedure: PARTIAL COLECTOMY;  Surgeon: Aviva Signs, MD;  Location: [facility];  Service: General;  Laterality: N/A;   right knee arthroscopy     SMALL INTESTINE SURGERY N/A  Phreesia 08/10/2020   TOTAL KNEE ARTHROPLASTY Left 10/17/2017   Procedure: TOTAL KNEE ARTHROPLASTY;  Surgeon: Carole Civil, MD;  Location: [facility];  Service: Orthopedics;  Laterality: Left;  DePuy    TOTAL KNEE REVISION Left 10/20/2020   Procedure: REVISION TIBIAL COMPONENT LEFT TOTAL KNEE;  Surgeon: Carole Civil, MD;  Location: [facility];  Service: Orthopedics;  Laterality: Left;   TUBAL LIGATION      There were no vitals filed for this visit.   Subjective Assessment - 11/25/20 1340     Subjective pt states her pain is improved today ot 2/10. Stlil  swollen but not as bad as last week.    Currently in Pain? Yes    Pain Score 2     Pain Location Knee    Pain Orientation Left    Pain Descriptors / Indicators Aching                               [facility] Adult PT Treatment/Exercise - 11/25/20 0001       Knee/Hip Exercises: Stretches   Active Hamstring Stretch Left;3 reps;20 seconds      Knee/Hip Exercises: Aerobic   Stationary Bike 5 minutes seat at 10 full revolutions      Knee/Hip Exercises: Machines for Strengthening   Cybex Knee Flexion hold      Knee/Hip Exercises: Standing   Knee Flexion Left;15 reps    Forward Lunges Left;20 reps    Forward Lunges Limitations 4" no UE's for functional strengthening/stablity.    Lateral Step Up Left;Hand Hold: 2;Step Height: 4";10 reps    Lateral Step Up Limitations eccentric control    Forward Step Up Left;10 reps;Step Height: 4"    Forward Step Up Limitations power ups    Step Down Left;10 reps;Step Height: 2";Hand Hold: 1      Knee/Hip Exercises: Seated   Long Arc Quad Left;10 reps    Long [employer] Limitations 10 second holds      Knee/Hip Exercises: Supine   Quad Sets Strengthening;Left;3 sets;10 reps    Short Arc [employer] Left;2 sets;15 reps    Knee Extension AROM;Left    Knee Extension Limitations 2    Knee Flexion AROM;Left    Knee Flexion Limitations 118      Manual Therapy   Manual Therapy Soft tissue mobilization    Manual therapy comments done seperate from all other aspects of treatment    Soft tissue mobilization scar tissue mobilization                      PT Short Term Goals - 11/06/20 1134       PT SHORT TERM GOAL #1   Title Pt will be independent with HEP and perform consistently in order to decrease pain and maximize ROM.    Time 3    Period Weeks    Status New    Target Date 11/27/20      PT SHORT TERM GOAL #2   Title Pt will have improved L knee AROM from 4-120 deg to maximize gait.    Time 3    Period Weeks     Status New      PT SHORT TERM GOAL #3   Title PT pain in LT knee will be no greater than a 6/10 to allow pt to be waking only one time a night for better sleep.    Time 3  Period Weeks    Status New      PT SHORT TERM GOAL #4   Title Pt will have 1/2 grade improvement in MMT throughout in order to maximize gait and functional mobility.    Time 3    Period Weeks    Status New               PT Long Term Goals - 11/06/20 1136       PT LONG TERM GOAL #1   Title Pt will have improved L knee AROM from 0-125deg in order to further maximize gait as well as sitting tolerance and stair ambulation.    Time 6    Period Weeks    Status New    Target Date 12/18/20      PT LONG TERM GOAL #2   Title Pt will be able to perform 5xSTS in 10 sec or < with no UE and mechanics WFL to demo improved balance and functional strength.    Time 6    Period Weeks    Status New      PT LONG TERM GOAL #3   Title Pt will be able to perform L SLS for 30 sec or > to demo improved balance and functional strength in order to maximize gait and stair ambulation.    Time 6    Period Weeks    Status New      PT LONG TERM GOAL #4   Title Pt will be able to ambulate 664f during 3MWT with LRAD and gait WFL in order to maximize return to PLOF.    Time 6    Period Weeks    Status New      PT LONG TERM GOAL #5   Title Pt will have 1 grade improvement throughout BLE MMT in order to be able to go and down  up 4-8 steps in a reciprocal manner with one hand on railing    Time 6    Period Weeks    Status New      Additional Long Term Goals   Additional Long Term Goals Yes      PT LONG TERM GOAL #6   Title PT pain in Lt knee to be no greater than a 3/10 to allow pt to be able to sleep throughout the night    Time 6    Period Weeks    Status New                   Plan - 11/25/20 1522     Clinical Impression Statement Pt with weakness as demonstrated with step and lunge activities.  Pt  reported set back due to increased soreness last week but feels last session really improved this.  Continued with LE strengthening and working on scar tissue reduction.  Pt unable to extend LE fully with SAQ due to quad weakness compounded with scar tissue.  Scar massage completed with most tightness superior knee.  Pt reported improvement in discomfort at end of session.    Personal Factors and Comorbidities Past/Current Experience;Comorbidity 2    Comorbidities Lt TKR in 2018/ MI in December 2021    Examination-Activity Limitations Bathing;Bend;Carry;Caring for Others;Dressing;Hygiene/Grooming;Lift;Locomotion Level;Toileting;Stand;Stairs;Squat;Sleep;Sit    Examination-Participation Restrictions Church;Cleaning;Community Activity;Driving;Laundry;Meal Prep;Occupation;Shop;Yard Work    Stability/Clinical Decision Making Stable/Uncomplicated    Rehab Potential Good    PT Frequency 3x / week    PT Treatment/Interventions Therapeutic exercise;Therapeutic activities;Patient/family education;Passive range of motion;Balance training;Manual techniques    PT Next  Visit Plan Manual for scar reduction and quad strengthening working on eccentric control.             Patient will benefit from skilled therapeutic intervention in order to improve the following deficits and impairments:  Abnormal gait, Decreased activity tolerance, Decreased balance, Decreased mobility, Difficulty walking, Increased edema, Increased fascial restricitons, Pain, Decreased range of motion, Decreased strength  Visit Diagnosis: Stiffness of left knee, not elsewhere classified  Acute pain of left knee  Other abnormalities of gait and mobility  Muscle weakness (generalized)     Problem List Patient Active Problem List   Diagnosis Date Noted   S/P revision of total knee, 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)    Chronic pain in left foot 08/31/2020   Coronary artery disease     Diabetes mellitus without complication ([place])    Hypertension    Hypertriglyceridemia    Non-ST elevation (NSTEMI) myocardial infarction ([place])    Chest pain 04/06/2020   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    Chondral defect of condyle of right femur    S/P left knee arthroscopy 04/27/17 03/28/2017   S/P total knee replacement, 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    Abdominal pain 02/17/2015   Nausea without vomiting 02/17/2015   Diverticulitis of intestine with abscess 01/08/2015   Acute diverticulitis 01/08/2015   Nausea 12/31/2014   Diverticulitis 12/31/2014   Hyperglycemia 12/31/2014   Medication reaction 12/31/2014   Diverticulitis of large intestine without perforation or abscess without bleeding    Thrush, oral    Fatty liver 03/11/2014   Family hx of colon cancer requiring screening colonoscopy 03/11/2014   Rt flank pain 03/11/2014   Ulnar nerve compression 06/14/2011   Compartment syndrome, nontraumatic 06/08/2011   LOW BACK PAIN 11/07/2007   SCIATICA 11/07/2007   LUMBAR SPASM 11/07/2007   Teena Irani, PTA/CLT [phone]  Teena Irani 11/25/2020, 3:26 PM  [facility] [address] [place], [place], [address] Phone: [phone]   Fax:  [phone]  Name: KAYDENSE SCIALDONE MRN: [id number] Date of Birth: 11-01-1970

## 2020-11-26 ENCOUNTER — Encounter (HOSPITAL_COMMUNITY): Payer: 59

## 2020-11-27 ENCOUNTER — Other Ambulatory Visit: Payer: Self-pay | Admitting: Nurse Practitioner

## 2020-11-27 ENCOUNTER — Ambulatory Visit (HOSPITAL_COMMUNITY): Payer: 59 | Admitting: Physical Therapy

## 2020-11-27 ENCOUNTER — Other Ambulatory Visit: Payer: Self-pay

## 2020-11-27 ENCOUNTER — Encounter (HOSPITAL_COMMUNITY): Payer: Self-pay | Admitting: Physical Therapy

## 2020-11-27 DIAGNOSIS — E1165 Type 2 diabetes mellitus with hyperglycemia: Secondary | ICD-10-CM

## 2020-11-27 DIAGNOSIS — M25562 Pain in left knee: Secondary | ICD-10-CM

## 2020-11-27 DIAGNOSIS — M25662 Stiffness of left knee, not elsewhere classified: Secondary | ICD-10-CM

## 2020-11-27 DIAGNOSIS — R2689 Other abnormalities of gait and mobility: Secondary | ICD-10-CM

## 2020-11-27 DIAGNOSIS — M6281 Muscle weakness (generalized): Secondary | ICD-10-CM

## 2020-11-27 NOTE — Therapy (Signed)
[place], [place], [address] Phone: [phone]   Fax:  [phone]  Physical Therapy Treatment  Patient Details  Name: [name] MRN: [id number] Date of Birth: 03/10/1971 Referring Provider (PT): Arther Abbott   Encounter Date: 11/27/2020   PT End of Session - 11/27/20 1525     Visit Number 9    Number of Visits 18    Date for PT Re-Evaluation 12/18/20    Authorization Type [employer]    Authorization - Visit Number 9    Authorization - Number of Visits 30    Progress Note Due on Visit 85    PT Start Time 1450    PT Stop Time [id number]    PT Time Calculation (min) 40 min    Activity Tolerance Patient tolerated treatment well    Behavior During Therapy [facility] for tasks assessed/performed             Past Medical History:  Diagnosis Date   Arthritis    Carpal tunnel syndrome, bilateral    Coronary artery disease    Diabetes mellitus without complication ([place])    no meds; diet controlled   Diverticulitis    Hyperlipidemia    Phreesia 08/10/2020   Hypertension    Hypertriglyceridemia    Myocardial infarction ([place])    Phreesia 08/10/2020   PONV (postoperative nausea and vomiting)    Sciatica     Past Surgical History:  Procedure Laterality Date   ablasion of uterus     CARDIAC CATHETERIZATION     CARPAL TUNNEL RELEASE  11/10/2010   Procedure: CARPAL TUNNEL RELEASE;  Surgeon: Arther Abbott, MD;  Location: [facility];  Service: Orthopedics;  Laterality: Right;   CHOLECYSTECTOMY     [facility]   COLON RESECTION N/A 04/08/2015   Procedure: LAPAROSCOPIC HAND ASSISTED PARTIAL COLECTOMY  CONVERTED TO OPEN AT [id number];  Surgeon: Aviva Signs, MD;  Location: [facility];  Service: General;  Laterality: N/A;   COLONOSCOPY N/A 03/28/2014   Procedure: COLONOSCOPY;  Surgeon: Rogene Houston, MD;  Location: [facility];  Service: Endoscopy;  Laterality: N/A;  730   CORONARY STENT INTERVENTION  04/07/2020   CORONARY  STENT INTERVENTION N/A 04/07/2020   Procedure: CORONARY STENT INTERVENTION;  Surgeon: Jettie Booze, MD;  Location: [place] [facility];  Service: Cardiovascular;  Laterality: N/A;   JOINT REPLACEMENT N/A    Phreesia 08/10/2020   KNEE ARTHROSCOPY WITH DRILLING/MICROFRACTURE Left 10/20/2016   Procedure: LEFT KNEE ARTHROSCOPY WITH MICROFRACTURE;  Surgeon: Carole Civil, MD;  Location: [facility];  Service: Orthopedics;  Laterality: Left;   KNEE ARTHROSCOPY WITH DRILLING/MICROFRACTURE Left 04/27/2017   Procedure: KNEE ARTHROSCOPY WITH DRILLING/MICROFRACTURE;  Surgeon: Carole Civil, MD;  Location: [facility];  Service: Orthopedics;  Laterality: Left;   KNEE ARTHROSCOPY WITH OSTEOCHONDRAL DEFECT REPAIR Left 04/27/2017   Procedure: KNEE ARTHROSCOPY WITH OSTEOCHONDRAL DEFECT REPAIR Autograft;  Surgeon: Carole Civil, MD;  Location: [facility];  Service: Orthopedics;  Laterality: Left;   KNEE SURGERY     LEFT HEART CATH AND CORONARY ANGIOGRAPHY N/A 04/07/2020   Procedure: LEFT HEART CATH AND CORONARY ANGIOGRAPHY;  Surgeon: Jettie Booze, MD;  Location: [place] [facility];  Service: Cardiovascular;  Laterality: N/A;   PARTIAL COLECTOMY N/A 04/08/2015   Procedure: PARTIAL COLECTOMY;  Surgeon: Aviva Signs, MD;  Location: [facility];  Service: General;  Laterality: N/A;   right knee arthroscopy     SMALL INTESTINE SURGERY N/A  Phreesia 08/10/2020   TOTAL KNEE ARTHROPLASTY Left 10/17/2017   Procedure: TOTAL KNEE ARTHROPLASTY;  Surgeon: Carole Civil, MD;  Location: [facility];  Service: Orthopedics;  Laterality: Left;  [place]    TOTAL KNEE REVISION Left 10/20/2020   Procedure: REVISION TIBIAL COMPONENT LEFT TOTAL KNEE;  Surgeon: Carole Civil, MD;  Location: [facility];  Service: Orthopedics;  Laterality: Left;   TUBAL LIGATION      There were no vitals filed for this visit.   Subjective Assessment - 11/27/20 1449     Subjective Pt states that her knee is feeling pretty good  today.  She is walking without her cane in her home. She has noted increased swelling in her knee. Pt to the MD on the 10th,  Pt states she is fearful of steps due to the fact that she doesn't have the strength in her knee.    Pertinent History MI, LT TKR 2019; HTN    Limitations Sitting;Lifting;Standing;Walking;House hold activities    How long can you sit comfortably? 1-five minutes    How long can you stand comfortably? 10 minutes    How long can you walk comfortably? walking with a cane and a walker  she has not walked greater than a minute or two at a time on her own.  With therapy she was walking for about five minutes.    Patient Stated Goals less pain, move better, walk better.    Currently in Pain? Yes    Pain Score 1     Pain Location Knee    Pain Orientation Left    Pain Descriptors / Indicators Aching    Pain Type Acute pain    Pain Onset 1 to 4 weeks ago    Pain Frequency Intermittent    Aggravating Factors  WB    Pain Relieving Factors meds and ice                [facility] PT Assessment - 11/27/20 0001       Assessment   Medical Diagnosis Lt TKR revision    Referring Provider (PT) Arther Abbott    Onset Date/Surgical Date 10/20/20    Prior Therapy hospital      Precautions   Precautions None      Home Environment   Living Environment Private residence    Home Access Stairs to enter    Entrance Stairs-Number of Steps [place] to live on main level with bedroom/bathroom    Additional Comments unable to complete steps in a reciprocal manner.      Prior Function   Level of Independence Independent    Vocation Full time employment    Vocation Requirements nuse; 8 hours    Leisure walk      Cognition   Overall Cognitive Status Within Functional Limits for tasks assessed      Functional Tests   Functional tests Single leg stance;Sit to Stand      Single Leg Stance   Comments unable do to pain      Sit to Stand   Comments unable to come  sit to stand without using her arms      AROM   Left Knee Extension 1    Left Knee Flexion 120      Strength   Right Hip Flexion 5/5    Right Hip Extension 5/5    Right Hip ABduction 5/5    Left Hip Flexion 4/5   significant extension lag  with SLR   Left Hip Extension 5/5    Left Hip ABduction 5/5    Right Knee Flexion 5/5    Right Knee Extension 5/5    Left Knee Flexion 5/5    Left Knee Extension 4-/5    Right Ankle Dorsiflexion 5/5    Left Ankle Dorsiflexion 5/5      Ambulation/Gait   Ambulation Distance (Feet) 148 Feet    Assistive device Straight cane    Gait Pattern Decreased arm swing - left;Decreased dorsiflexion - left    Gait Comments 2 minutes walk test                           [facility] Adult PT Treatment/Exercise - 11/27/20 0001       Knee/Hip Exercises: Stretches   Active Hamstring Stretch --    Passive Hamstring Stretch Left;3 reps;30 seconds    Knee: Self-Stretch to increase Flexion Left;3 reps;30 seconds    Gastroc Stretch 3 reps;30 seconds    Gastroc Stretch Limitations slant board      Knee/Hip Exercises: Aerobic   Stationary Bike 5 minutes seat at 10 full revolutions      Knee/Hip Exercises: Standing   Heel Raises Both;10 reps    Knee Flexion Left;10 reps    Terminal Knee Extension Left;10 reps    Forward Step Up Left;10 reps;Hand Hold: 1;Step Height: 4"    Step Down Left;10 reps;Hand Hold: 1;Step Height: 4"      Knee/Hip Exercises: Seated   Long Arc Quad Left;10 reps   hold for 10 seconds     Knee/Hip Exercises: Supine   Quad Sets Right;10 reps    Straight Leg Raises Left;10 reps    Knee Extension Limitations 1    Knee Flexion Limitations 120      Knee/Hip Exercises: Prone   Other Prone Exercises terminal extension x5      Manual Therapy   Manual Therapy Edema management;Soft tissue mobilization;Joint mobilization    Manual therapy comments done seperate from all other aspects of treatment    Edema Management with  decongestive techniques                      PT Short Term Goals - 11/06/20 1134       PT SHORT TERM GOAL #1   Title Pt will be independent with HEP and perform consistently in order to decrease pain and maximize ROM.    Time 3    Period Weeks    Status New    Target Date 11/27/20      PT SHORT TERM GOAL #2   Title Pt will have improved L knee AROM from 4-120 deg to maximize gait.    Time 3    Period Weeks    Status New      PT SHORT TERM GOAL #3   Title PT pain in LT knee will be no greater than a 6/10 to allow pt to be waking only one time a night for better sleep.    Time 3    Period Weeks    Status New      PT SHORT TERM GOAL #4   Title Pt will have 1/2 grade improvement in MMT throughout in order to maximize gait and functional mobility.    Time 3    Period Weeks    Status New               PT  Long Term Goals - 11/06/20 1136       PT LONG TERM GOAL #1   Title Pt will have improved L knee AROM from 0-125deg in order to further maximize gait as well as sitting tolerance and stair ambulation.    Time 6    Period Weeks    Status New    Target Date 12/18/20      PT LONG TERM GOAL #2   Title Pt will be able to perform 5xSTS in 10 sec or < with no UE and mechanics WFL to demo improved balance and functional strength.    Time 6    Period Weeks    Status New      PT LONG TERM GOAL #3   Title Pt will be able to perform L SLS for 30 sec or > to demo improved balance and functional strength in order to maximize gait and stair ambulation.    Time 6    Period Weeks    Status New      PT LONG TERM GOAL #4   Title Pt will be able to ambulate 657f during 3MWT with LRAD and gait WFL in order to maximize return to PLOF.    Time 6    Period Weeks    Status New      PT LONG TERM GOAL #5   Title Pt will have 1 grade improvement throughout BLE MMT in order to be able to go and down  up 4-8 steps in a reciprocal manner with one hand on railing    Time 6     Period Weeks    Status New      Additional Long Term Goals   Additional Long Term Goals Yes      PT LONG TERM GOAL #6   Title PT pain in Lt knee to be no greater than a 3/10 to allow pt to be able to sleep throughout the night    Time 6    Period Weeks    Status New                   Plan - 11/27/20 1526     Clinical Impression Statement PT with improved ROM and strength.  Patella is moving more freely as well.  PT continues to have decreased quadricep strength.  Therapist noted that when pt is going down steps she leads with her shoulders once this was corrected going down steps was easire.    Personal Factors and Comorbidities Past/Current Experience;Comorbidity 2    Comorbidities Lt TKR in 2018/ MI in December 2021    Examination-Activity Limitations Bathing;Bend;Carry;Caring for Others;Dressing;Hygiene/Grooming;Lift;Locomotion Level;Toileting;Stand;Stairs;Squat;Sleep;Sit    Examination-Participation Restrictions Church;Cleaning;Community Activity;Driving;Laundry;Meal Prep;Occupation;Shop;Yard Work    Stability/Clinical Decision Making Stable/Uncomplicated    Clinical Decision Making Moderate    Rehab Potential Good    PT Frequency 3x / week    PT Duration 6 weeks    PT Treatment/Interventions Therapeutic exercise;Therapeutic activities;Patient/family education;Passive range of motion;Balance training;Manual techniques    PT Next Visit Plan Reassess for MD visit next treatment. Manual for scar reduction and quad strengthening working on eccentric control.             Patient will benefit from skilled therapeutic intervention in order to improve the following deficits and impairments:  Abnormal gait, Decreased activity tolerance, Decreased balance, Decreased mobility, Difficulty walking, Increased edema, Increased fascial restricitons, Pain, Decreased range of motion, Decreased strength  Visit Diagnosis: Stiffness of left knee, not elsewhere classified  Acute  pain of left knee  Other abnormalities of gait and mobility  Muscle weakness (generalized)     Problem List Patient Active Problem List   Diagnosis Date Noted   S/P revision of total knee, 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)    Chronic pain in left foot 08/31/2020   Coronary artery disease    Diabetes mellitus without complication ([place])    Hypertension    Hypertriglyceridemia    Non-ST elevation (NSTEMI) myocardial infarction ([place])    Chest pain 04/06/2020   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    Chondral defect of condyle of right femur    S/P left knee arthroscopy 04/27/17 03/28/2017   S/P total knee replacement, 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    Abdominal pain 02/17/2015   Nausea without vomiting 02/17/2015   Diverticulitis of intestine with abscess 01/08/2015   Acute diverticulitis 01/08/2015   Nausea 12/31/2014   Diverticulitis 12/31/2014   Hyperglycemia 12/31/2014   Medication reaction 12/31/2014   Diverticulitis of large intestine without perforation or abscess without bleeding    Thrush, oral    Fatty liver 03/11/2014   Family hx of colon cancer requiring screening colonoscopy 03/11/2014   Rt flank pain 03/11/2014   Ulnar nerve compression 06/14/2011   Compartment syndrome, nontraumatic 06/08/2011   LOW BACK PAIN 11/07/2007   SCIATICA 11/07/2007   LUMBAR SPASM 11/07/2007   Rayetta Humphrey, PT CLT [phone]  11/27/2020, 3:34 PM  [place] [address] [place], [place], [address] Phone: [phone]   Fax:  [phone]  Name: [name] MRN: [id number] Date of Birth: [birth year]

## 2020-11-30 ENCOUNTER — Ambulatory Visit (HOSPITAL_COMMUNITY): Payer: 59 | Admitting: Physical Therapy

## 2020-11-30 ENCOUNTER — Encounter (HOSPITAL_COMMUNITY): Payer: Self-pay | Admitting: Physical Therapy

## 2020-11-30 ENCOUNTER — Other Ambulatory Visit: Payer: Self-pay

## 2020-11-30 DIAGNOSIS — M25662 Stiffness of left knee, not elsewhere classified: Secondary | ICD-10-CM | POA: Diagnosis not present

## 2020-11-30 DIAGNOSIS — M6281 Muscle weakness (generalized): Secondary | ICD-10-CM

## 2020-11-30 DIAGNOSIS — R2689 Other abnormalities of gait and mobility: Secondary | ICD-10-CM

## 2020-11-30 DIAGNOSIS — M25562 Pain in left knee: Secondary | ICD-10-CM

## 2020-11-30 NOTE — Therapy (Signed)
[facility] [address] [place], [place], [address] Phone: [phone]   Fax:  [phone]  Physical Therapy Treatment and Progress Note  Patient Details  Name: [name] MRN: [id number] Date of Birth: [birth year] Referring Provider (PT): Arther Abbott  KNEE ROM 1-120 Left Able to walk 426 feet without cane   Progress Note Reporting Period 11/06/20 to 11/30/20  See note below for Objective Data and Assessment of Progress/Goals.      Encounter Date: 11/30/2020   PT End of Session - 11/30/20 1619     Visit Number 10    Number of Visits 18    Date for PT Re-Evaluation 12/18/20    Authorization Type [employer] VL [id number]    Authorization - Visit Number 10    Authorization - Number of Visits 30    Progress Note Due on Visit 62    PT Start Time 1615    PT Stop Time 1655    PT Time Calculation (min) 40 min    Activity Tolerance Patient tolerated treatment well    Behavior During Therapy WFL for tasks assessed/performed             Past Medical History:  Diagnosis Date   Arthritis    Carpal tunnel syndrome, bilateral    Coronary artery disease    Diabetes mellitus without complication ([place])    no meds; diet controlled   Diverticulitis    Hyperlipidemia    Phreesia 08/10/2020   Hypertension    Hypertriglyceridemia    Myocardial infarction ([place])    Phreesia 08/10/2020   PONV (postoperative nausea and vomiting)    Sciatica     Past Surgical History:  Procedure Laterality Date   ablasion of uterus     CARDIAC CATHETERIZATION     CARPAL TUNNEL RELEASE  11/10/2010   Procedure: CARPAL TUNNEL RELEASE;  Surgeon: Arther Abbott, MD;  Location: [facility];  Service: Orthopedics;  Laterality: Right;   CHOLECYSTECTOMY     [facility]   COLON RESECTION N/A 04/08/2015   Procedure: LAPAROSCOPIC HAND ASSISTED PARTIAL COLECTOMY  CONVERTED TO OPEN AT 5732;  Surgeon: Aviva Signs, MD;  Location: [facility];  Service: General;  Laterality:  N/A;   COLONOSCOPY N/A 03/28/2014   Procedure: COLONOSCOPY;  Surgeon: Rogene Houston, MD;  Location: [facility];  Service: Endoscopy;  Laterality: N/A;  730   CORONARY STENT INTERVENTION  04/07/2020   CORONARY STENT INTERVENTION N/A 04/07/2020   Procedure: CORONARY STENT INTERVENTION;  Surgeon: Jettie Booze, MD;  Location: [facility];  Service: Cardiovascular;  Laterality: N/A;   JOINT REPLACEMENT N/A    Phreesia 08/10/2020   KNEE ARTHROSCOPY WITH DRILLING/MICROFRACTURE Left 10/20/2016   Procedure: LEFT KNEE ARTHROSCOPY WITH MICROFRACTURE;  Surgeon: Carole Civil, MD;  Location: [facility];  Service: Orthopedics;  Laterality: Left;   KNEE ARTHROSCOPY WITH DRILLING/MICROFRACTURE Left 04/27/2017   Procedure: KNEE ARTHROSCOPY WITH DRILLING/MICROFRACTURE;  Surgeon: Carole Civil, MD;  Location: [facility];  Service: Orthopedics;  Laterality: Left;   KNEE ARTHROSCOPY WITH OSTEOCHONDRAL DEFECT REPAIR Left 04/27/2017   Procedure: KNEE ARTHROSCOPY WITH OSTEOCHONDRAL DEFECT REPAIR Autograft;  Surgeon: Carole Civil, MD;  Location: [facility];  Service: Orthopedics;  Laterality: Left;   KNEE SURGERY     LEFT HEART CATH AND CORONARY ANGIOGRAPHY N/A 04/07/2020   Procedure: LEFT HEART CATH AND CORONARY ANGIOGRAPHY;  Surgeon: Jettie Booze, MD;  Location: [facility];  Service: Cardiovascular;  Laterality: N/A;  PARTIAL COLECTOMY N/A 04/08/2015   Procedure: PARTIAL COLECTOMY;  Surgeon: Aviva Signs, MD;  Location: [facility];  Service: General;  Laterality: N/A;   right knee arthroscopy     SMALL INTESTINE SURGERY N/A    Phreesia 08/10/2020   TOTAL KNEE ARTHROPLASTY Left 10/17/2017   Procedure: TOTAL KNEE ARTHROPLASTY;  Surgeon: Carole Civil, MD;  Location: [facility];  Service: Orthopedics;  Laterality: Left;  [place]    TOTAL KNEE REVISION Left 10/20/2020   Procedure: REVISION TIBIAL COMPONENT LEFT TOTAL KNEE;  Surgeon: Carole Civil, MD;  Location: [facility];   Service: Orthopedics;  Laterality: Left;   TUBAL LIGATION      There were no vitals filed for this visit.   Subjective Assessment - 11/30/20 1619     Subjective States she feels her medication is making her ill. States that otherwise she feels alright. Reprots her knee is sore and feels "jammed" but otherwise is fine. Reports she feels about 80% better    Pertinent History MI, LT TKR 2019; HTN    Limitations Sitting;Lifting;Standing;Walking;House hold activities    How long can you sit comfortably? 1-five minutes    How long can you stand comfortably? 10 minutes    How long can you walk comfortably? walking with a cane and a walker  she has not walked greater than a minute or two at a time on her own.  With therapy she was walking for about five minutes.    Patient Stated Goals less pain, move better, walk better.    Currently in Pain? Yes    Pain Score 3     Pain Location Knee    Pain Orientation Left    Pain Descriptors / Indicators Aching;Sore    Pain Type Surgical pain    Pain Onset 1 to 4 weeks ago                [facility] PT Assessment - 11/30/20 0001       Assessment   Medical Diagnosis Lt TKR revision    Referring Provider (PT) Arther Abbott    Onset Date/Surgical Date 10/20/20    Next MD Visit 12/02/20      Observation/Other Assessments   Focus on Therapeutic Outcomes (FOTO)  53% function   predicted 56% function     AROM   Left Knee Extension 1    Left Knee Flexion 120      Strength   Overall Strength Comments MMT taken from 11/27/20    Right Hip Flexion 5/5    Right Hip Extension 5/5    Right Hip ABduction 5/5    Left Hip Flexion 4/5    Left Hip ABduction 5/5    Right Knee Flexion 5/5    Right Knee Extension 5/5    Left Knee Flexion 5/5    Left Knee Extension 4-/5    Right Ankle Dorsiflexion 5/5    Left Ankle Dorsiflexion 5/5      Transfers   Five time sit to stand comments  9.13 seconds no UE support      Ambulation/Gait   Ambulation/Gait Yes     Ambulation Distance (Feet) 452 Feet    Assistive device Straight cane    Gait Pattern Decreased arm swing - left;Decreased dorsiflexion - left    Ambulation Surface Level;Indoor    Gait velocity decreased    Gait Comments 3MW, pain      Balance   Balance Assessed Yes      Static Standing Balance  Static Standing Balance -  Activities  Single Leg Stance - Right Leg;Single Leg Stance - Left Leg    Static Standing - Comment/# of Minutes R SLS 30 seconds, L 30 seconds increased sway on left compared to right                           [facility] Adult PT Treatment/Exercise - 11/30/20 0001       Knee/Hip Exercises: Aerobic   Stationary Bike 5 minutes seat at 10 full revolutions      Knee/Hip Exercises: Seated   Knee/Hip Flexion patella mobilizations inferior 2 mintues total                    PT Education - 11/30/20 1647     Education Details on current presentation, on HEP, on FOTO score, on reduction in visits. on swelling management    Person(s) Educated Patient    Methods Explanation    Comprehension Verbalized understanding              PT Short Term Goals - 11/30/20 1624       PT SHORT TERM GOAL #1   Title Pt will be independent with HEP and perform consistently in order to decrease pain and maximize ROM.    Time 3    Period Weeks    Status Achieved    Target Date 11/27/20      PT SHORT TERM GOAL #2   Title Pt will have improved L knee AROM from 4-120 deg to maximize gait.    Baseline 1-120    Time 3    Period Weeks    Status Achieved      PT SHORT TERM GOAL #3   Title PT pain in LT knee will be no greater than a 6/10 to allow pt to be waking only one time a night for better sleep.    Baseline reports occasional 5/10    Time 3    Period Weeks    Status Achieved      PT SHORT TERM GOAL #4   Title Pt will have 1/2 grade improvement in MMT throughout in order to maximize gait and functional mobility.    Time 3    Period Weeks     Status Achieved               PT Long Term Goals - 11/30/20 1624       PT LONG TERM GOAL #1   Title Pt will have improved L knee AROM from 0-125deg in order to further maximize gait as well as sitting tolerance and stair ambulation.    Baseline 1-120    Time 6    Period Weeks    Status On-going      PT LONG TERM GOAL #2   Title Pt will be able to perform 5xSTS in 10 sec or < with no UE and mechanics WFL to demo improved balance and functional strength.    Time 6    Period Weeks    Status Achieved      PT LONG TERM GOAL #3   Title Pt will be able to perform L SLS for 30 sec or > to demo improved balance and functional strength in order to maximize gait and stair ambulation.    Time 6    Period Weeks    Status Achieved      PT LONG TERM GOAL #4   Title Pt  will be able to ambulate 664ft during 3MWT with LRAD and gait WFL in order to maximize return to PLOF.    Baseline 452 ft no AD    Time 6    Period Weeks    Status On-going      PT LONG TERM GOAL #5   Title Pt will have 1 grade improvement throughout BLE MMT in order to be able to go and down  up 4-8 steps in a reciprocal manner with one hand on railing    Time 6    Period Weeks    Status On-going      PT LONG TERM GOAL #6   Title PT pain in Lt knee to be no greater than a 3/10 to allow pt to be able to sleep throughout the night    Baseline 5/10    Time 6    Period Weeks    Status On-going                   Plan - 11/30/20 1653     Clinical Impression Statement Patient present for a progress note on this date. She has made great progress and has met 4/4 short term goals and 2/6 long term goals. Reviewed HEP and reducing patient to 2/week secondary to progress and independence in HEP. Patient encouraged to wear her compression garments secondary to recent increase in swelling. Will continue with current POC as tolerated.    Personal Factors and Comorbidities Past/Current Experience;Comorbidity 2     Comorbidities Lt TKR in 2018/ MI in December 2021    Examination-Activity Limitations Bathing;Bend;Carry;Caring for Others;Dressing;Hygiene/Grooming;Lift;Locomotion Level;Toileting;Stand;Stairs;Squat;Sleep;Sit    Examination-Participation Restrictions Church;Cleaning;Community Activity;Driving;Laundry;Meal Prep;Occupation;Shop;Yard Work    Stability/Clinical Decision Making Stable/Uncomplicated    Rehab Potential Good    PT Frequency 3x / week    PT Duration 6 weeks    PT Treatment/Interventions Therapeutic exercise;Therapeutic activities;Patient/family education;Passive range of motion;Balance training;Manual techniques    PT Next Visit Plan quad strengthening working on eccentric control. and for walking endurance without hyperextension.             Patient will benefit from skilled therapeutic intervention in order to improve the following deficits and impairments:  Abnormal gait, Decreased activity tolerance, Decreased balance, Decreased mobility, Difficulty walking, Increased edema, Increased fascial restricitons, Pain, Decreased range of motion, Decreased strength  Visit Diagnosis: Stiffness of left knee, not elsewhere classified  Acute pain of left knee  Other abnormalities of gait and mobility  Muscle weakness (generalized)     Problem List Patient Active Problem List   Diagnosis Date Noted   S/P revision of total knee, 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)    Chronic pain in left foot 08/31/2020   Coronary artery disease    Diabetes mellitus without complication (HCC)    Hypertension    Hypertriglyceridemia    Non-ST elevation (NSTEMI) myocardial infarction [facility]    Chest pain 04/06/2020   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    Chondral defect of condyle of right femur    S/P left knee arthroscopy 04/27/17 03/28/2017   S/P total knee replacement, 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     Abdominal pain 02/17/2015   Nausea without vomiting 02/17/2015   Diverticulitis of intestine with abscess 01/08/2015   Acute diverticulitis 01/08/2015   Nausea 12/31/2014   Diverticulitis 12/31/2014   Hyperglycemia 12/31/2014   Medication reaction 12/31/2014   Diverticulitis  of large intestine without perforation or abscess without bleeding    Thrush, oral    Fatty liver 03/11/2014   Family hx of colon cancer requiring screening colonoscopy 03/11/2014   Rt flank pain 03/11/2014   Ulnar nerve compression 06/14/2011   Compartment syndrome, nontraumatic 06/08/2011   LOW BACK PAIN 11/07/2007   SCIATICA 11/07/2007   LUMBAR SPASM 11/07/2007   4:58 PM, 11/30/20 Jerene Pitch, DPT Physical Therapy with [facility]  [phone] office   [facility] [place] [address] [place], [place], [address] Phone: [phone]   Fax:  [phone]  Name: [name] MRN: [id number] Date of Birth: Oct 02, 1970

## 2020-12-02 ENCOUNTER — Ambulatory Visit (INDEPENDENT_AMBULATORY_CARE_PROVIDER_SITE_OTHER): Payer: 59 | Admitting: Orthopedic Surgery

## 2020-12-02 ENCOUNTER — Encounter: Payer: Self-pay | Admitting: Orthopedic Surgery

## 2020-12-02 ENCOUNTER — Ambulatory Visit (HOSPITAL_COMMUNITY): Payer: 59

## 2020-12-02 ENCOUNTER — Other Ambulatory Visit: Payer: Self-pay

## 2020-12-02 ENCOUNTER — Other Ambulatory Visit: Payer: Self-pay | Admitting: Orthopedic Surgery

## 2020-12-02 DIAGNOSIS — Z96652 Presence of left artificial knee joint: Secondary | ICD-10-CM

## 2020-12-02 MED ORDER — TRAMADOL HCL 50 MG PO TABS: 50.0000 mg | ORAL_TABLET | Freq: Four times a day (QID) | ORAL | 0 refills | Status: DC | PRN

## 2020-12-02 MED ORDER — OXYCODONE HCL 15 MG PO TABS: 15.0000 mg | ORAL_TABLET | ORAL | 0 refills | Status: DC | PRN

## 2020-12-02 NOTE — Progress Notes (Signed)
Chief Complaint  Patient presents with   Post-op Follow-up    Left knee total / revision 10/20/20 improving meeting goals in therapy needs med changes and Refills Oxycodone '10mg'$  and Tramadol     Status post tibial revision postop day 43  Abrol says she is much better she feels like it is much different than the first surgery  She does have some swelling in the knee more than usual  She is ambulating with a cane with a good gait she has 0 to 118 degree range of motion and swelling of the joint but no signs of infection  I recommended she ice it more often continue her therapy she wants to return to work on the 22nd half days  She will follow with me in 4 weeks  She will refill her oxycodone at 10 mg and tramadol at 50 mg  Meds ordered this encounter  Medications   oxyCODONE (ROXICODONE) 15 MG immediate release tablet    Sig: Take 1 tablet (15 mg total) by mouth every 4 (four) hours as needed for pain.    Dispense:  30 tablet    Refill:  0   traMADol (ULTRAM) 50 MG tablet    Sig: Take 1 tablet (50 mg total) by mouth every 6 (six) hours as needed.    Dispense:  28 tablet    Refill:  0

## 2020-12-03 ENCOUNTER — Encounter (HOSPITAL_COMMUNITY): Payer: 59 | Admitting: Physical Therapy

## 2020-12-03 ENCOUNTER — Telehealth: Payer: Self-pay | Admitting: Orthopedic Surgery

## 2020-12-03 LAB — COMPREHENSIVE METABOLIC PANEL
ALT: 56 IU/L — ABNORMAL HIGH (ref 0–32)
AST: 24 IU/L (ref 0–40)
Albumin/Globulin Ratio: 2 (ref 1.2–2.2)
Albumin: 4.6 g/dL (ref 3.8–4.8)
Alkaline Phosphatase: 96 IU/L (ref 44–121)
BUN/Creatinine Ratio: 36 — ABNORMAL HIGH (ref 9–23)
BUN: 22 mg/dL (ref 6–24)
Bilirubin Total: 0.4 mg/dL (ref 0.0–1.2)
CO2: 22 mmol/L (ref 20–29)
Calcium: 9.4 mg/dL (ref 8.7–10.2)
Chloride: 101 mmol/L (ref 96–106)
Creatinine, Ser: 0.61 mg/dL (ref 0.57–1.00)
Globulin, Total: 2.3 g/dL (ref 1.5–4.5)
Glucose: 188 mg/dL — ABNORMAL HIGH (ref 65–99)
Potassium: 4.9 mmol/L (ref 3.5–5.2)
Sodium: 136 mmol/L (ref 134–144)
Total Protein: 6.9 g/dL (ref 6.0–8.5)
eGFR: 110 mL/min/{1.73_m2} (ref >59)

## 2020-12-03 MED ORDER — OXYCODONE-ACETAMINOPHEN 10-325 MG PO TABS: 1.0000 | ORAL_TABLET | ORAL | 0 refills | Status: AC | PRN

## 2020-12-03 NOTE — Telephone Encounter (Signed)
Mikala from [facility] ph# [phone], called to ask if this new medication (Percocet 10) is a change in therapy, as states patient did pick up the other pain medication yesterday, both the Oxycodone release tab and the Tramadol. Please advise - please ask for pharmacist.

## 2020-12-03 NOTE — Telephone Encounter (Signed)
She wanted it decreased to 10 I wrote on paper

## 2020-12-03 NOTE — Addendum Note (Signed)
Addended by: Arther Abbott E on: 12/03/2020 11:51 AM   Modules accepted: Orders

## 2020-12-03 NOTE — Telephone Encounter (Signed)
Patient called to relay that she had discussed the 'strength' of the pain medication at time of visit yesterday, 12/02/20 - said it is too strong. Please advise.  (oxyCODONE (ROXICODONE) 15 MG immediate release tablet) 30 tablet

## 2020-12-03 NOTE — Telephone Encounter (Signed)
Done

## 2020-12-03 NOTE — Telephone Encounter (Signed)
I will just leave it like it is for now and pick up 10

## 2020-12-03 NOTE — Telephone Encounter (Signed)
Advised pharmacy ok to pick up

## 2020-12-04 ENCOUNTER — Other Ambulatory Visit: Payer: Self-pay

## 2020-12-04 ENCOUNTER — Ambulatory Visit (HOSPITAL_COMMUNITY): Payer: 59

## 2020-12-04 ENCOUNTER — Encounter (HOSPITAL_COMMUNITY): Payer: Self-pay

## 2020-12-04 DIAGNOSIS — M25662 Stiffness of left knee, not elsewhere classified: Secondary | ICD-10-CM

## 2020-12-04 DIAGNOSIS — R2689 Other abnormalities of gait and mobility: Secondary | ICD-10-CM

## 2020-12-04 DIAGNOSIS — M6281 Muscle weakness (generalized): Secondary | ICD-10-CM

## 2020-12-04 DIAGNOSIS — M25562 Pain in left knee: Secondary | ICD-10-CM

## 2020-12-04 NOTE — Progress Notes (Signed)
No orders of the defined types were placed in this encounter. No chief complaint on file.  Epic is asking for a note seems to already be in note for this visit on August 10 but in any event the patient came in for postop appointment she was doing well she is regaining her motion she wanted her pain medicine refilled which was done she is advised to continue therapy and follow-up in 4 weeks

## 2020-12-04 NOTE — Therapy (Signed)
[place], [place], [address] Phone: [phone]   Fax:  [phone]  Physical Therapy Treatment  Patient Details  Name: [name] MRN: [id number] Date of Birth: Feb 21, 1971 Referring Provider (PT): Arther Abbott   Encounter Date: 12/04/2020   PT End of Session - 12/04/20 1140     Visit Number 11    Number of Visits 18    Date for PT Re-Evaluation 12/18/20    Authorization Type [employer] VL [id number]    Authorization - Visit Number 11    Authorization - Number of Visits 30    Progress Note Due on Visit 73    PT Start Time 1135    PT Stop Time 1213    PT Time Calculation (min) 38 min    Activity Tolerance Patient tolerated treatment well    Behavior During Therapy [facility] for tasks assessed/performed             Past Medical History:  Diagnosis Date   Arthritis    Carpal tunnel syndrome, bilateral    Coronary artery disease    Diabetes mellitus without complication ([place])    no meds; diet controlled   Diverticulitis    Hyperlipidemia    Phreesia 08/10/2020   Hypertension    Hypertriglyceridemia    Myocardial infarction ([place])    Phreesia 08/10/2020   PONV (postoperative nausea and vomiting)    Sciatica     Past Surgical History:  Procedure Laterality Date   ablasion of uterus     CARDIAC CATHETERIZATION     CARPAL TUNNEL RELEASE  11/10/2010   Procedure: CARPAL TUNNEL RELEASE;  Surgeon: Arther Abbott, MD;  Location: [facility];  Service: Orthopedics;  Laterality: Right;   CHOLECYSTECTOMY     [facility]   COLON RESECTION N/A 04/08/2015   Procedure: LAPAROSCOPIC HAND ASSISTED PARTIAL COLECTOMY  CONVERTED TO OPEN AT [id number];  Surgeon: Aviva Signs, MD;  Location: [facility];  Service: General;  Laterality: N/A;   COLONOSCOPY N/A 03/28/2014   Procedure: COLONOSCOPY;  Surgeon: Rogene Houston, MD;  Location: [facility];  Service: Endoscopy;  Laterality: N/A;  730   CORONARY STENT INTERVENTION  04/07/2020    CORONARY STENT INTERVENTION N/A 04/07/2020   Procedure: CORONARY STENT INTERVENTION;  Surgeon: Jettie Booze, MD;  Location: [place] [facility];  Service: Cardiovascular;  Laterality: N/A;   JOINT REPLACEMENT N/A    Phreesia 08/10/2020   KNEE ARTHROSCOPY WITH DRILLING/MICROFRACTURE Left 10/20/2016   Procedure: LEFT KNEE ARTHROSCOPY WITH MICROFRACTURE;  Surgeon: Carole Civil, MD;  Location: [facility];  Service: Orthopedics;  Laterality: Left;   KNEE ARTHROSCOPY WITH DRILLING/MICROFRACTURE Left 04/27/2017   Procedure: KNEE ARTHROSCOPY WITH DRILLING/MICROFRACTURE;  Surgeon: Carole Civil, MD;  Location: [facility];  Service: Orthopedics;  Laterality: Left;   KNEE ARTHROSCOPY WITH OSTEOCHONDRAL DEFECT REPAIR Left 04/27/2017   Procedure: KNEE ARTHROSCOPY WITH OSTEOCHONDRAL DEFECT REPAIR Autograft;  Surgeon: Carole Civil, MD;  Location: [facility];  Service: Orthopedics;  Laterality: Left;   KNEE SURGERY     LEFT HEART CATH AND CORONARY ANGIOGRAPHY N/A 04/07/2020   Procedure: LEFT HEART CATH AND CORONARY ANGIOGRAPHY;  Surgeon: Jettie Booze, MD;  Location: [place] [facility];  Service: Cardiovascular;  Laterality: N/A;   PARTIAL COLECTOMY N/A 04/08/2015   Procedure: PARTIAL COLECTOMY;  Surgeon: Aviva Signs, MD;  Location: [facility];  Service: General;  Laterality: N/A;   right knee arthroscopy     SMALL INTESTINE SURGERY N/A  Phreesia 08/10/2020   TOTAL KNEE ARTHROPLASTY Left 10/17/2017   Procedure: TOTAL KNEE ARTHROPLASTY;  Surgeon: Carole Civil, MD;  Location: [facility];  Service: Orthopedics;  Laterality: Left;  [place]    TOTAL KNEE REVISION Left 10/20/2020   Procedure: REVISION TIBIAL COMPONENT LEFT TOTAL KNEE;  Surgeon: Carole Civil, MD;  Location: [facility];  Service: Orthopedics;  Laterality: Left;   TUBAL LIGATION      There were no vitals filed for this visit.   Subjective Assessment - 12/04/20 1140     Subjective Pt stated knee is feeling good today.   Reports she and MD happy wiht progress.    Pertinent History MI, LT TKR 2019; HTN    Patient Stated Goals less pain, move better, walk better.    Currently in Pain? No/denies                               [facility] Adult PT Treatment/Exercise - 12/04/20 0001       Ambulation/Gait   Ambulation Distance (Feet) 333 Feet    Assistive device None    Gait Pattern Decreased arm swing - left;Decreased dorsiflexion - left    Ambulation Surface Level;Indoor    Gait velocity decreased    Gait Comments 2MWT pain free      Knee/Hip Exercises: Stretches   Active Hamstring Stretch Left;3 reps;20 seconds    Active Hamstring Stretch Limitations 12in step height    Gastroc Stretch 3 reps;30 seconds    Gastroc Stretch Limitations slant board      Knee/Hip Exercises: Machines for Strengthening   Other Machine [employer] TKE static 3Pl 15x 5"; 2Pl retro gait toe to heel then heel to toe slow control forward      Knee/Hip Exercises: Standing   Heel Raises 20 reps    Heel Raises Limitations no HHA    Lateral Step Up Left;Hand Hold: 2;Step Height: 4";10 reps;2 sets    Forward Step Up Left;20 reps;Hand Hold: 1;Hand Hold: 0;Step Height: 4"    Step Down Left;10 reps;Hand Hold: 1;Step Height: 4"    Gait Training 2MWT      Knee/Hip Exercises: Seated   Long Arc Quad Left;10 reps    Long Arc Quad Weight 3 lbs.    Long [employer] Limitations 10 second holds    Sit to [employer] 2 sets;10 reps;without UE support   eccentric control                   PT Education - 12/04/20 1242     Education Details Educated on benefits with compression garments for edema control, measurements taken and paperwork given for [employer]    Person(s) Educated Patient    Methods Explanation;Handout    Comprehension Verbalized understanding;Returned demonstration              PT Short Term Goals - 11/30/20 1624       PT SHORT TERM GOAL #1   Title Pt will be independent with HEP and  perform consistently in order to decrease pain and maximize ROM.    Time 3    Period Weeks    Status Achieved    Target Date 11/27/20      PT SHORT TERM GOAL #2   Title Pt will have improved L knee AROM from 4-120 deg to maximize gait.    Baseline 1-120    Time 3    Period Weeks  Status Achieved      PT SHORT TERM GOAL #3   Title PT pain in LT knee will be no greater than a 6/10 to allow pt to be waking only one time a night for better sleep.    Baseline reports occasional 5/10    Time 3    Period Weeks    Status Achieved      PT SHORT TERM GOAL #4   Title Pt will have 1/2 grade improvement in MMT throughout in order to maximize gait and functional mobility.    Time 3    Period Weeks    Status Achieved               PT Long Term Goals - 11/30/20 1624       PT LONG TERM GOAL #1   Title Pt will have improved L knee AROM from 0-125deg in order to further maximize gait as well as sitting tolerance and stair ambulation.    Baseline 1-120    Time 6    Period Weeks    Status On-going      PT LONG TERM GOAL #2   Title Pt will be able to perform 5xSTS in 10 sec or < with no UE and mechanics WFL to demo improved balance and functional strength.    Time 6    Period Weeks    Status Achieved      PT LONG TERM GOAL #3   Title Pt will be able to perform L SLS for 30 sec or > to demo improved balance and functional strength in order to maximize gait and stair ambulation.    Time 6    Period Weeks    Status Achieved      PT LONG TERM GOAL #4   Title Pt will be able to ambulate 644f during 3MWT with LRAD and gait WFL in order to maximize return to PLOF.    Baseline 452 ft no AD    Time 6    Period Weeks    Status On-going      PT LONG TERM GOAL #5   Title Pt will have 1 grade improvement throughout BLE MMT in order to be able to go and down  up 4-8 steps in a reciprocal manner with one hand on railing    Time 6    Period Weeks    Status On-going      PT LONG TERM  GOAL #6   Title PT pain in Lt knee to be no greater than a 3/10 to allow pt to be able to sleep throughout the night    Baseline 5/10    Time 6    Period Weeks    Status On-going                   Plan - 12/04/20 1243     Clinical Impression Statement Pt progressing well towards POC.  Session focus with knee strengthening primarly quadriceps.  Added TKE based activities and continued with stair training for quad strengthening.  Difficulty wiht eccentric control due to quad weakness.  Pt reports swelling continues to cause pain, educated on benefits of compression garments for edema control.  Measurements taken and handout given for [employer]    Personal Factors and Comorbidities Past/Current Experience;Comorbidity 2    Comorbidities Lt TKR in 2018/ MI in December 2021    Examination-Activity Limitations Bathing;Bend;Carry;Caring for Others;Dressing;Hygiene/Grooming;Lift;Locomotion Level;Toileting;Stand;Stairs;Squat;Sleep;Sit    Examination-Participation Restrictions Church;Cleaning;Community Activity;Driving;Laundry;Meal Prep;Occupation;Shop;Yard Work  Stability/Clinical Decision Making Stable/Uncomplicated    Clinical Decision Making Moderate    Rehab Potential Good    PT Frequency 3x / week    PT Duration 6 weeks    PT Treatment/Interventions Therapeutic exercise;Therapeutic activities;Patient/family education;Passive range of motion;Balance training;Manual techniques    PT Next Visit Plan quad strengthening working on eccentric control. and for walking endurance without hyperextension.; 8/12: f/u with compression garment and answer any questions wiht donning.    PT Home Exercise Plan Eval:  sitting: LAQ, heelslide, supine quad set, heel slide and SLR    Consulted and Agree with Plan of Care Patient             Patient will benefit from skilled therapeutic intervention in order to improve the following deficits and impairments:  Abnormal gait, Decreased  activity tolerance, Decreased balance, Decreased mobility, Difficulty walking, Increased edema, Increased fascial restricitons, Pain, Decreased range of motion, Decreased strength  Visit Diagnosis: Stiffness of left knee, not elsewhere classified  Acute pain of left knee  Other abnormalities of gait and mobility  Muscle weakness (generalized)     Problem List Patient Active Problem List   Diagnosis Date Noted   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)    Chronic pain in left foot 08/31/2020   Coronary artery disease    Diabetes mellitus without complication (HCC)    Hypertension    Hypertriglyceridemia    Non-ST elevation (NSTEMI) myocardial infarction ([place])    Chest pain 04/06/2020   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    Chondral defect of condyle of right femur    S/P left knee arthroscopy 04/27/17 03/28/2017   S/P total knee replacement, 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    Abdominal pain 02/17/2015   Nausea without vomiting 02/17/2015   Diverticulitis of intestine with abscess 01/08/2015   Acute diverticulitis 01/08/2015   Nausea 12/31/2014   Diverticulitis 12/31/2014   Hyperglycemia 12/31/2014   Medication reaction 12/31/2014   Diverticulitis of large intestine without perforation or abscess without bleeding    Thrush, oral    Fatty liver 03/11/2014   Family hx of colon cancer requiring screening colonoscopy 03/11/2014   Rt flank pain 03/11/2014   Ulnar nerve compression 06/14/2011   Compartment syndrome, nontraumatic 06/08/2011   LOW BACK PAIN 11/07/2007   SCIATICA 11/07/2007   LUMBAR SPASM 11/07/2007   Ihor Austin, LPTA/CLT; CBIS [phone]  Aldona Lento 12/04/2020, 12:50 PM  [place], [place], [address] Phone: [phone]   Fax:   [phone]  Name: MELITZA LISIECKI MRN: [id number] Date of Birth: [birth year]

## 2020-12-07 ENCOUNTER — Encounter (HOSPITAL_COMMUNITY): Payer: 59 | Admitting: Physical Therapy

## 2020-12-07 NOTE — Patient Instructions (Signed)

## 2020-12-07 NOTE — Progress Notes (Addendum)
Cardiology Office Note  Date: 12/08/2020   ID: [name], DOB August 06, 1970, MRN [id number]  PCP:  Jani Gravel, MD  Cardiologist:  Carlyle Dolly, MD Electrophysiologist:  None   Chief Complaint: 36-monthfollow-up status post NSTEMI  History of Present Illness: Bridget TETTERis a 50y.o. female with a history of NSTEMI , chest pain,  CAD, DM2, HTN, Hypertriglyceridemia. Hx palpitations (SVT vs sinus tachycardia), current smoker.  She presented to [facility] 04/06/2020 for burning sensation long her throat and with burning / aching sensation along her left arm for the prior few days. Occurred with or without activity. Episodes lasted a few minutes up to 30 minutes.  Troponins  42>50>106>86>161. Echo EF 70% no RWMAs. Cardiac catheterization : Mid Lcx 99%, 70% pRCA with DES. Residual disease distal Cx 50%, 50% RPDA. DAPT therapy with ASA and Brillinta 1 year, then plavix as monotherapy. Lipitor was increased to 80 mg daily. To repeat Lipids in 6 -8 weeks. Consider fenofibrate or Lovasa. She was hypertensive on arrival  To hospital. She was not taking her metoprolol. She was started on Coreg 6.25 mg po bid and Losartan 50 mg daily. To have BMP at follow up.  She was here last visit May 19, 2019 for 1 month follow-up.  She had been having some pain in her back and lower legs since starting atorvastatin.  She was taking metformin only once a day but believes she needed to take it twice a day to better manage her blood sugars.  She was feeling somewhat tired.  She was concerned she may be on too much cholesterol medication.  We decreased atorvastatin to 40 mg daily which she has tolerated.  She is here today for 676-monthollow-up.  She denies any cardiac related problems.  She recently had a left knee replacement in the interim since last visit.  She states she has had a hard time with rehabilitation and has a fair amount of pain.  She states she is not been very active since surgery.  She is  concerned her blood sugar levels have increased.  She denies any anginal or exertional symptoms, orthostatic symptoms, CVA or TIA-like symptoms, PND, orthopnea.  Denies any bleeding issues.  Denies any lower extremity edema, or DVT/PE-like symptoms.    Past Medical History:  Diagnosis Date   Arthritis    Carpal tunnel syndrome, bilateral    Coronary artery disease    Diabetes mellitus without complication (HCCerritos   no meds; diet controlled   Diverticulitis    Hyperlipidemia    Phreesia 08/10/2020   Hypertension    Hypertriglyceridemia    Myocardial infarction (HCGem   Phreesia 08/10/2020   PONV (postoperative nausea and vomiting)    Sciatica     Past Surgical History:  Procedure Laterality Date   ablasion of uterus     CARDIAC CATHETERIZATION     CARPAL TUNNEL RELEASE  11/10/2010   Procedure: CARPAL TUNNEL RELEASE;  Surgeon: StArther AbbottMD;  Location: [facility];  Service: Orthopedics;  Laterality: Right;   CHOLECYSTECTOMY     [facility]   COLON RESECTION N/A 04/08/2015   Procedure: LAPAROSCOPIC HAND ASSISTED PARTIAL COLECTOMY  CONVERTED TO OPEN AT [id number] Surgeon: MaAviva SignsMD;  Location: [facility];  Service: General;  Laterality: N/A;   COLONOSCOPY N/A 03/28/2014   Procedure: COLONOSCOPY;  Surgeon: NaRogene HoustonMD;  Location: [facility];  Service: Endoscopy;  Laterality: N/A;  730   CORONARY STENT INTERVENTION  04/07/2020   CORONARY STENT INTERVENTION N/A 04/07/2020   Procedure: CORONARY STENT INTERVENTION;  Surgeon: Jettie Booze, MD;  Location: [facility];  Service: Cardiovascular;  Laterality: N/A;   JOINT REPLACEMENT N/A    Phreesia 08/10/2020   KNEE ARTHROSCOPY WITH DRILLING/MICROFRACTURE Left 10/20/2016   Procedure: LEFT KNEE ARTHROSCOPY WITH MICROFRACTURE;  Surgeon: Carole Civil, MD;  Location: [facility];  Service: Orthopedics;  Laterality: Left;   KNEE ARTHROSCOPY WITH DRILLING/MICROFRACTURE Left 04/27/2017   Procedure: KNEE ARTHROSCOPY WITH  DRILLING/MICROFRACTURE;  Surgeon: Carole Civil, MD;  Location: [facility];  Service: Orthopedics;  Laterality: Left;   KNEE ARTHROSCOPY WITH OSTEOCHONDRAL DEFECT REPAIR Left 04/27/2017   Procedure: KNEE ARTHROSCOPY WITH OSTEOCHONDRAL DEFECT REPAIR Autograft;  Surgeon: Carole Civil, MD;  Location: [facility];  Service: Orthopedics;  Laterality: Left;   KNEE SURGERY     LEFT HEART CATH AND CORONARY ANGIOGRAPHY N/A 04/07/2020   Procedure: LEFT HEART CATH AND CORONARY ANGIOGRAPHY;  Surgeon: Jettie Booze, MD;  Location: [facility];  Service: Cardiovascular;  Laterality: N/A;   PARTIAL COLECTOMY N/A 04/08/2015   Procedure: PARTIAL COLECTOMY;  Surgeon: Aviva Signs, MD;  Location: [facility];  Service: General;  Laterality: N/A;   right knee arthroscopy     SMALL INTESTINE SURGERY N/A    Phreesia 08/10/2020   TOTAL KNEE ARTHROPLASTY Left 10/17/2017   Procedure: TOTAL KNEE ARTHROPLASTY;  Surgeon: Carole Civil, MD;  Location: [facility];  Service: Orthopedics;  Laterality: Left;  [place]    TOTAL KNEE REVISION Left 10/20/2020   Procedure: REVISION TIBIAL COMPONENT LEFT TOTAL KNEE;  Surgeon: Carole Civil, MD;  Location: [facility];  Service: Orthopedics;  Laterality: Left;   TUBAL LIGATION      Current Outpatient Medications  Medication Sig Dispense Refill   atorvastatin (LIPITOR) 40 MG tablet Take 40 mg by mouth daily.     carvedilol (COREG) 12.5 MG tablet Take 1 tablet (12.5 mg total) by mouth 2 (two) times daily. 180 tablet 1   celecoxib (CELEBREX) 200 MG capsule Take 1 capsule (200 mg total) by mouth 2 (two) times daily. 60 capsule 1   cholecalciferol (VITAMIN D) 25 MCG (1000 UNIT) tablet Take 1,000 Units by mouth daily.     clopidogrel (PLAVIX) 75 MG tablet Take 1 tablet (75 mg total) by mouth daily. Take 4 tablets on day one then 1 tablet daily thereafter. (Patient taking differently: Take 75 mg by mouth daily.) 94 tablet 3   docusate sodium (COLACE) 100 MG capsule Take 1  capsule (100 mg total) by mouth 2 (two) times daily. 10 capsule 0   glipiZIDE (GLUCOTROL XL) 5 MG 24 hr tablet Take 1 tablet (5 mg total) by mouth daily with breakfast. 90 tablet 3   glucose blood (ONETOUCH VERIO) test strip Use as instructed to monitor blood glucose once daily. 100 each 12   Krill Oil (OMEGA-3) 500 MG CAPS Take 1,000 mg by mouth daily.     Lancets (ONETOUCH DELICA PLUS 123XX123) MISC by Does not apply route.     losartan (COZAAR) 50 MG tablet Take 1 tablet by mouth once daily 90 tablet 0   metFORMIN (GLUCOPHAGE-XR) 500 MG 24 hr tablet TAKE 2 TABLETS BY MOUTH ONCE DAILY WITH BREAKFAST 180 tablet 0   methocarbamol (ROBAXIN) 500 MG tablet Take 500 mg by mouth every 6 (six) hours as needed for muscle spasms.     nitroGLYCERIN (NITROSTAT) 0.4 MG SL tablet PLACE 1 TABLET (0.4 MG TOTAL)  UNDER THE TONGUE EVERY 5 (FIVE) MINUTES AS NEEDED FOR CHEST PAIN. (Patient taking differently: Place 0.4 mg under the tongue every 5 (five) minutes as needed for chest pain.) 25 tablet 12   oxyCODONE-acetaminophen (PERCOCET) 10-325 MG tablet Take 1 tablet by mouth every 4 (four) hours as needed for up to 7 days for pain. 42 tablet 0   promethazine (PHENERGAN) 12.5 MG tablet Take 1 tablet (12.5 mg total) by mouth every 4 (four) hours as needed for nausea or vomiting. 42 tablet 5   traMADol (ULTRAM) 50 MG tablet Take 1 tablet (50 mg total) by mouth every 6 (six) hours as needed. 28 tablet 0   No current facility-administered medications for this visit.   Allergies:  Other, Flagyl [metronidazole], and Hydrocodone   Social History: The patient  reports that she has been smoking cigarettes. She has a 39.00 pack-year smoking history. She has never used smokeless tobacco. She reports that she does not drink alcohol and does not use drugs.   Family History: The patient's family history includes Arthritis in an other family member; Asthma in an other family member; Cancer in an other family member; Diabetes in  an other family member; Diabetes type I in her mother; Diabetes type II in her brother, brother, and father; Hyperlipidemia in her father and mother; Hypertension in her father.   ROS:  Please see the history of present illness. Otherwise, complete review of systems is positive for none.  All other systems are reviewed and negative.   Physical Exam: VS:  BP 130/74   Pulse 76   Ht '5\' 5"'$  (1.651 m)   Wt 221 lb 9.6 oz (100.5 kg)   SpO2 94%   BMI 36.88 kg/m , BMI Body mass index is 36.88 kg/m.  Wt Readings from Last 3 Encounters:  12/08/20 221 lb 9.6 oz (100.5 kg)  10/28/20 210 lb (95.3 kg)  10/20/20 210 lb 1.6 oz (95.3 kg)    General: Obese patient appears comfortable at rest. Neck: Supple, no elevated JVP or carotid bruits, no thyromegaly. Lungs: Clear to auscultation, nonlabored breathing at rest. Cardiac: Regular rate and rhythm, no S3 or significant systolic murmur, no pericardial rub. Extremities: No pitting edema, distal pulses 2+. Skin: Warm and dry.  Right radial access site clean and dry without redness or swelling 2+ pulses Musculoskeletal: No kyphosis. Neuropsychiatric: Alert and oriented x3, affect grossly appropriate.  ECG:  EKG 04/07/2020 sinus bradycardia rate of 53.  Recent Labwork: 05/19/2020: Magnesium 1.9; TSH 0.905 10/21/2020: Hemoglobin 13.0; Platelets 303 12/02/2020: ALT 56; AST 24; BUN 22; Creatinine, Ser 0.61; Potassium 4.9; Sodium 136     Component Value Date/Time   CHOL 89 05/19/2020 0903   TRIG 125 05/19/2020 0903   HDL 23 (L) 05/19/2020 0903   CHOLHDL 3.9 05/19/2020 0903   VLDL 25 05/19/2020 0903   LDLCALC 41 05/19/2020 0903    Other Studies Reviewed Today:  04/07/2020  CORONARY STENT INTERVENTION  LEFT HEART CATH AND CORONARY ANGIOGRAPHY      Conclusion  Dist Cx lesion is 50% stenosed. Mid Cx lesion is 99% stenosed. This was the culprit lesion for presentation. A drug-eluting stent was successfully placed using a STENT RESOLUTE ONYX 3.0X22,  postdilated to 3.75 mm, and optimized by IVUS Post intervention, there is a 0% residual stenosis. Prox RCA lesion is 70% stenosed. Cross sectional area 3.9 mm2 by IVUS. A drug-eluting stent was successfully placed using a STENT RESOLUTE ONYX 3.5X12, postdilated to > 4 mm and optimized by IVUS.  RPDA lesion is 50% stenosed. Post intervention, there is a 0% residual stenosis. The left ventricular systolic function is normal. LV end diastolic pressure is mildly elevated. The left ventricular ejection fraction is 55-65% by visual estimate. There is no aortic valve stenosis.   Consider clopidogrel monotherapy after 12 months of DAPT given her diffuse distal vessel disease.    Continue aggressive secondary prevention.        Intervention        Echo 04/06/20  1. Left ventricular ejection fraction, by estimation, is 65 to 70%. The  left ventricle has normal function. The left ventricle has no regional  wall motion abnormalities. There is mild left ventricular hypertrophy.  Left ventricular diastolic parameters  were normal.   2. Right ventricular systolic function is normal. The right ventricular  size is normal.   3. Left atrial size was mildly dilated.   4. The mitral valve is normal in structure. No evidence of mitral valve  regurgitation. No evidence of mitral stenosis.   5. The aortic valve is tricuspid. Aortic valve regurgitation is not  visualized. No aortic stenosis is present.   6. The inferior vena cava is normal in size with greater than 50%  respiratory variability, suggesting right atrial pressure of 3 mmHg.    Assessment and Plan:   1.  CAD/Non-ST elevation (NSTEMI) myocardial infarction [facility]) Recent NSTEMI with PCI/DES to Mid circumflex and proximal RCA.  Had residual disease in distal circumflex of 50% and RPDA lesion 50%.  Currently denies any anginal symptoms.  Continue aspirin 81 mg daily.  Plavix 75 mg daily.  Continue sublingual nitroglycerin as needed.   Continue Coreg to 12.5 mg p.o. twice daily.     2. Mixed hyperlipidemia We have decreased atorvastatin back to 40 mg daily after complaints of myalgia on dose of 80 mg daily.  Her labs in January showed total cholesterol of 89, triglycerides 125, HDL 23, LDL 41.  Continue atorvastatin 40 mg daily.    3. Essential hypertension Blood pressure BP today 130/70 follow-up.  Continue carvedilol to 12.5 mg p.o. twice daily.  Continue losartan 50 mg p.o. daily.  4. Type 2 diabetes mellitus without complication, without long-term current use of insulin ([place]) Patient has diabetes and not on any antihyperglycemics medications.  Patient states she seen Dr. Dorris Fetch in [place] and was recently started on Metformin once a day but it appears not to be controlling her sugars in the evening.  Her metformin was increased to 1000 mg daily.   5.  Fatigue At last visit she had been feeling increased fatigue over the prior weeks weeks in addition to muscle-like pain in both her lower extremities.  Thyroid panel was normal.  Vitamin B-12 was normal.  CBC was normal except for slightly elevated WBC of 11.8.  Chemistry showed elevated glucose of 159 otherwise unremarkable.  Vitamin D was low at 25.99.  She was instructed to start taking vitamin D3.  Liver function test were normal, magnesium was normal   Medication Adjustments/Labs and Tests Ordered: Current medicines are reviewed at length with the patient today.  Concerns regarding medicines are outlined above.   Disposition: Follow-up with Dr. Harl Bowie or [facility] 6 months Signed, Levell July, NP 12/08/2020 11:44 AM    [facility] [place], [place], [place] [address] Phone: [phone]; Fax: [phone]

## 2020-12-08 ENCOUNTER — Ambulatory Visit (INDEPENDENT_AMBULATORY_CARE_PROVIDER_SITE_OTHER): Payer: 59 | Admitting: Family Medicine

## 2020-12-08 ENCOUNTER — Ambulatory Visit (INDEPENDENT_AMBULATORY_CARE_PROVIDER_SITE_OTHER): Payer: 59 | Admitting: Nurse Practitioner

## 2020-12-08 ENCOUNTER — Encounter: Payer: Self-pay | Admitting: Nurse Practitioner

## 2020-12-08 ENCOUNTER — Encounter: Payer: Self-pay | Admitting: Family Medicine

## 2020-12-08 VITALS — BP 132/71 | HR 69 | Ht 65.0 in | Wt 221.0 lb

## 2020-12-08 VITALS — BP 130/74 | HR 76 | Ht 65.0 in | Wt 221.6 lb

## 2020-12-08 DIAGNOSIS — E781 Pure hyperglyceridemia: Secondary | ICD-10-CM | POA: Diagnosis not present

## 2020-12-08 DIAGNOSIS — E782 Mixed hyperlipidemia: Secondary | ICD-10-CM

## 2020-12-08 DIAGNOSIS — E1165 Type 2 diabetes mellitus with hyperglycemia: Secondary | ICD-10-CM | POA: Diagnosis not present

## 2020-12-08 DIAGNOSIS — R5383 Other fatigue: Secondary | ICD-10-CM

## 2020-12-08 DIAGNOSIS — E119 Type 2 diabetes mellitus without complications: Secondary | ICD-10-CM | POA: Diagnosis not present

## 2020-12-08 DIAGNOSIS — I10 Essential (primary) hypertension: Secondary | ICD-10-CM | POA: Diagnosis not present

## 2020-12-08 DIAGNOSIS — I2510 Atherosclerotic heart disease of native coronary artery without angina pectoris: Secondary | ICD-10-CM | POA: Diagnosis not present

## 2020-12-08 NOTE — Progress Notes (Signed)
Endocrinology Follow Up Note       12/08/2020, 4:15 PM   Subjective:    Patient ID: [name], female    DOB: [birth year].  [name] is being seen in follow up after being seen in consultation for management of currently uncontrolled symptomatic diabetes requested by  Jani Gravel, MD.   Past Medical History:  Diagnosis Date   Arthritis    Carpal tunnel syndrome, bilateral    Coronary artery disease    Diabetes mellitus without complication ([facility])    no meds; diet controlled   Diverticulitis    Hyperlipidemia    Phreesia 08/10/2020   Hypertension    Hypertriglyceridemia    Myocardial infarction ([place])    Phreesia 08/10/2020   PONV (postoperative nausea and vomiting)    Sciatica     Past Surgical History:  Procedure Laterality Date   ablasion of uterus     CARDIAC CATHETERIZATION     CARPAL TUNNEL RELEASE  11/10/2010   Procedure: CARPAL TUNNEL RELEASE;  Surgeon: Arther Abbott, MD;  Location: [facility];  Service: Orthopedics;  Laterality: Right;   CHOLECYSTECTOMY     [facility]   COLON RESECTION N/A 04/08/2015   Procedure: LAPAROSCOPIC HAND ASSISTED PARTIAL COLECTOMY  CONVERTED TO OPEN AT [id number];  Surgeon: Aviva Signs, MD;  Location: [facility];  Service: General;  Laterality: N/A;   COLONOSCOPY N/A 03/28/2014   Procedure: COLONOSCOPY;  Surgeon: Rogene Houston, MD;  Location: [facility];  Service: Endoscopy;  Laterality: N/A;  730   CORONARY STENT INTERVENTION  04/07/2020   CORONARY STENT INTERVENTION N/A 04/07/2020   Procedure: CORONARY STENT INTERVENTION;  Surgeon: Jettie Booze, MD;  Location: [facility];  Service: Cardiovascular;  Laterality: N/A;   JOINT REPLACEMENT N/A    Phreesia 08/10/2020   KNEE ARTHROSCOPY WITH DRILLING/MICROFRACTURE Left 10/20/2016   Procedure: LEFT KNEE ARTHROSCOPY WITH MICROFRACTURE;  Surgeon: Carole Civil, MD;  Location: [facility];  Service: Orthopedics;   Laterality: Left;   KNEE ARTHROSCOPY WITH DRILLING/MICROFRACTURE Left 04/27/2017   Procedure: KNEE ARTHROSCOPY WITH DRILLING/MICROFRACTURE;  Surgeon: Carole Civil, MD;  Location: [facility];  Service: Orthopedics;  Laterality: Left;   KNEE ARTHROSCOPY WITH OSTEOCHONDRAL DEFECT REPAIR Left 04/27/2017   Procedure: KNEE ARTHROSCOPY WITH OSTEOCHONDRAL DEFECT REPAIR Autograft;  Surgeon: Carole Civil, MD;  Location: [facility];  Service: Orthopedics;  Laterality: Left;   KNEE SURGERY     LEFT HEART CATH AND CORONARY ANGIOGRAPHY N/A 04/07/2020   Procedure: LEFT HEART CATH AND CORONARY ANGIOGRAPHY;  Surgeon: Jettie Booze, MD;  Location: [facility];  Service: Cardiovascular;  Laterality: N/A;   PARTIAL COLECTOMY N/A 04/08/2015   Procedure: PARTIAL COLECTOMY;  Surgeon: Aviva Signs, MD;  Location: [facility];  Service: General;  Laterality: N/A;   right knee arthroscopy     SMALL INTESTINE SURGERY N/A    Phreesia 08/10/2020   TOTAL KNEE ARTHROPLASTY Left 10/17/2017   Procedure: TOTAL KNEE ARTHROPLASTY;  Surgeon: Carole Civil, MD;  Location: [facility];  Service: Orthopedics;  Laterality: Left;  [place]    TOTAL KNEE REVISION Left 10/20/2020   Procedure: REVISION TIBIAL COMPONENT LEFT TOTAL KNEE;  Surgeon: Aline Brochure,  Tim Lair, MD;  Location: [facility];  Service: Orthopedics;  Laterality: Left;   TUBAL LIGATION      Social History   Socioeconomic History   Marital status: Divorced    Spouse name: Not on file   Number of children: Not on file   Years of education: college   Highest education level: Not on file  Occupational History   Occupation: nurse    Employer: [employer]  Tobacco Use   Smoking status: Every Day    Packs/day: 1.50    Years: 26.00    Pack years: 39.00    Types: Cigarettes    Last attempt to quit: 04/05/2020    Years since quitting: 0.6   Smokeless tobacco: Never  Vaping Use   Vaping Use: Never used  Substance and Sexual Activity   Alcohol use: No     Alcohol/week: 0.0 standard drinks   Drug use: No   Sexual activity: Yes    Birth control/protection: Surgical  Other Topics Concern   Not on file  Social History Narrative   Not on file   Social Determinants of Health   Financial Resource Strain: Not on file  Food Insecurity: Not on file  Transportation Needs: Not on file  Physical Activity: Not on file  Stress: Not on file  Social Connections: Not on file    Family History  Problem Relation Age of Onset   Diabetes type I Mother    Hyperlipidemia Mother    Diabetes type II Father    Hypertension Father    Hyperlipidemia Father    Diabetes type II Brother    Diabetes type II Brother    Cancer Other    Diabetes Other    Arthritis Other    Asthma Other    Anesthesia problems Neg Hx     Outpatient Encounter Medications as of 12/08/2020  Medication Sig   atorvastatin (LIPITOR) 40 MG tablet Take 40 mg by mouth daily.   carvedilol (COREG) 12.5 MG tablet Take 1 tablet (12.5 mg total) by mouth 2 (two) times daily.   celecoxib (CELEBREX) 200 MG capsule Take 1 capsule (200 mg total) by mouth 2 (two) times daily.   cholecalciferol (VITAMIN D) 25 MCG (1000 UNIT) tablet Take 1,000 Units by mouth daily.   clopidogrel (PLAVIX) 75 MG tablet Take 1 tablet (75 mg total) by mouth daily. Take 4 tablets on day one then 1 tablet daily thereafter. (Patient taking differently: Take 75 mg by mouth daily.)   docusate sodium (COLACE) 100 MG capsule Take 1 capsule (100 mg total) by mouth 2 (two) times daily.   glipiZIDE (GLUCOTROL XL) 5 MG 24 hr tablet Take 1 tablet (5 mg total) by mouth daily with breakfast.   glucose blood (ONETOUCH VERIO) test strip Use as instructed to monitor blood glucose once daily.   Krill Oil (OMEGA-3) 500 MG CAPS Take 1,000 mg by mouth daily.   Lancets (ONETOUCH DELICA PLUS 123XX123) MISC by Does not apply route.   losartan (COZAAR) 50 MG tablet Take 1 tablet by mouth once daily   metFORMIN (GLUCOPHAGE-XR) 500 MG 24 hr  tablet TAKE 2 TABLETS BY MOUTH ONCE DAILY WITH BREAKFAST   methocarbamol (ROBAXIN) 500 MG tablet Take 500 mg by mouth every 6 (six) hours as needed for muscle spasms.   nitroGLYCERIN (NITROSTAT) 0.4 MG SL tablet PLACE 1 TABLET (0.4 MG TOTAL) UNDER THE TONGUE EVERY 5 (FIVE) MINUTES AS NEEDED FOR CHEST PAIN. (Patient taking differently: Place 0.4 mg under the tongue  every 5 (five) minutes as needed for chest pain.)   oxyCODONE-acetaminophen (PERCOCET) 10-325 MG tablet Take 1 tablet by mouth every 4 (four) hours as needed for up to 7 days for pain.   promethazine (PHENERGAN) 12.5 MG tablet Take 1 tablet (12.5 mg total) by mouth every 4 (four) hours as needed for nausea or vomiting.   traMADol (ULTRAM) 50 MG tablet Take 1 tablet (50 mg total) by mouth every 6 (six) hours as needed.   [DISCONTINUED] atorvastatin (LIPITOR) 80 MG tablet Take 1 tablet (80 mg total) by mouth daily at 6 PM.   [DISCONTINUED] pantoprazole (PROTONIX) 40 MG tablet Take 1 tablet (40 mg total) by mouth daily. Further refills by PCP (Patient not taking: Reported on 05/18/2020)   No facility-administered encounter medications on file as of 12/08/2020.    ALLERGIES: Allergies  Allergen Reactions   Other    Flagyl [Metronidazole] Nausea Only   Hydrocodone Nausea And Vomiting and Other (See Comments)    Can take need to take with medication for nausea and vomitting    VACCINATION STATUS: Immunization History  Administered Date(s) Administered   Moderna Sars-Covid-2 Vaccination 06/25/2019   Diabetes She presents for her follow-up diabetic visit. She has type 2 diabetes mellitus. Onset time: She was diagnosed at approx age of 22. Her disease course has been stable. There are no hypoglycemic associated symptoms. Pertinent negatives for diabetes include no blurred vision, no fatigue, no polydipsia, no polyphagia, no polyuria and no visual change. There are no hypoglycemic complications. Symptoms are stable. Diabetic complications  include heart disease. (Had MI recently) Risk factors for coronary artery disease include diabetes mellitus, dyslipidemia, hypertension, obesity, tobacco exposure, sedentary lifestyle and family history. Current diabetic treatment includes oral agent (dual therapy). She is compliant with treatment most of the time. Her weight is increasing steadily. She is following a generally unhealthy diet. Meal planning includes avoidance of concentrated sweets. She has not had a previous visit with a dietitian. She rarely participates in exercise. Her home blood glucose trend is fluctuating minimally. Her breakfast blood glucose range is generally 130-140 mg/dl. Her bedtime blood glucose range is generally 140-180 mg/dl. (She presents today with her logs, no meter, showing stable, slightly above target fasting and at goal postprandial glycemic profile.  She was not due for another a1c today and her last one was 6.6% on 6/24.  She recently had left knee replacement and is struggling to exercise optimally and eat healthy as she is relying on others to help her with meals.  She does have mild afternoon hypoglycemia, associated with meal timing.) An ACE inhibitor/angiotensin II receptor blocker is being taken. She does not see a podiatrist.Eye exam is current.  Hyperlipidemia This is a chronic problem. The current episode started more than 1 year ago. The problem is controlled. Recent lipid tests were reviewed and are variable. Exacerbating diseases include diabetes and obesity. Factors aggravating her hyperlipidemia include beta blockers. Current antihyperlipidemic treatment includes statins. The current treatment provides moderate improvement of lipids. Compliance problems include adherence to diet and adherence to exercise.  Risk factors for coronary artery disease include diabetes mellitus, dyslipidemia, obesity, hypertension, a sedentary lifestyle and family history.  Hypertension This is a chronic problem. The current  episode started more than 1 year ago. The problem has been gradually improving since onset. The problem is controlled. Pertinent negatives include no blurred vision. There are no associated agents to hypertension. Risk factors for coronary artery disease include diabetes mellitus, dyslipidemia, obesity, sedentary lifestyle, smoking/tobacco  exposure and family history. Past treatments include beta blockers and angiotensin blockers. The current treatment provides mild improvement. There are no compliance problems.  Hypertensive end-organ damage includes CAD/MI.    Review of systems  Constitutional: + steadily increasing body weight,  current Body mass index is 36.78 kg/m. , no fatigue, no subjective hyperthermia, no subjective hypothermia Eyes: no blurry vision, no xerophthalmia ENT: no sore throat, no nodules palpated in throat, no dysphagia/odynophagia, no hoarseness Cardiovascular: no chest pain, no shortness of breath, no palpitations, no leg swelling Respiratory: no cough, no shortness of breath Gastrointestinal: no nausea/vomiting/diarrhea Musculoskeletal: recently had left knee replaced, walks with cane Skin: no rashes, no hyperemia Neurological: no tremors, no numbness, no tingling, no dizziness Psychiatric: no depression, no anxiety   Objective:     BP 132/71   Pulse 69   Ht '5\' 5"'$  (1.651 m)   Wt 221 lb (100.2 kg)   BMI 36.78 kg/m   Wt Readings from Last 3 Encounters:  12/08/20 221 lb (100.2 kg)  12/08/20 221 lb 9.6 oz (100.5 kg)  10/28/20 210 lb (95.3 kg)    BP Readings from Last 3 Encounters:  12/08/20 132/71  12/08/20 130/74  10/21/20 (!) 114/57     Physical Exam- Limited  Constitutional:  Body mass index is 36.78 kg/m. , not in acute distress, normal state of mind Eyes:  EOMI, no exophthalmos Neck: Supple Cardiovascular: RRR, no murmurs, rubs, or gallops, no edema Respiratory: Adequate breathing efforts, no crackles, rales, rhonchi, or  wheezing Musculoskeletal: no gross deformities, strength intact in all four extremities, no gross restriction of joint movements Skin:  no rashes, no hyperemia Neurological: no tremor with outstretched handss   Diabetic Foot Exam - Simple   Simple Foot Form Diabetic Foot exam was performed with the following findings: Yes 12/08/2020  4:13 PM  Visual Inspection See comments: Yes Sensation Testing Intact to touch and monofilament testing bilaterally: Yes Pulse Check Posterior Tibialis and Dorsalis pulse intact bilaterally: Yes Comments Right great toenail is ingrown causing her pain       CMP ( most recent) CMP     Component Value Date/Time   NA 136 12/02/2020 1204   K 4.9 12/02/2020 1204   CL 101 12/02/2020 1204   CO2 22 12/02/2020 1204   GLUCOSE 188 (H) 12/02/2020 1204   GLUCOSE 197 (H) 10/21/2020 0619   BUN 22 12/02/2020 1204   CREATININE 0.61 12/02/2020 1204   CALCIUM 9.4 12/02/2020 1204   PROT 6.9 12/02/2020 1204   ALBUMIN 4.6 12/02/2020 1204   AST 24 12/02/2020 1204   ALT 56 (H) 12/02/2020 1204   ALKPHOS 96 12/02/2020 1204   BILITOT 0.4 12/02/2020 1204   GFRNONAA >60 10/21/2020 0619   GFRAA >60 06/21/2019 2103     Diabetic Labs (most recent): Lab Results  Component Value Date   HGBA1C 6.6 (H) 10/16/2020   HGBA1C 6.3 08/05/2020   HGBA1C 7.0 (H) 04/06/2020     Lipid Panel ( most recent) Lipid Panel     Component Value Date/Time   CHOL 89 05/19/2020 0903   TRIG 125 05/19/2020 0903   HDL 23 (L) 05/19/2020 0903   CHOLHDL 3.9 05/19/2020 0903   VLDL 25 05/19/2020 0903   LDLCALC 41 05/19/2020 0903      Lab Results  Component Value Date   TSH 0.905 05/19/2020   TSH 1.321 04/06/2020   TSH 1.152 06/21/2019   FREET4 0.92 05/19/2020      Assessment & Plan:   1)  Type 2 diabetes mellitus with hyperglycemia, without long-term current use of insulin ([place])  She presents today with her logs, no meter, showing stable, slightly above target fasting and at  goal postprandial glycemic profile.  She was not due for another a1c today and her last one was 6.6% on 6/24.  She recently had left knee replacement and is struggling to exercise optimally and eat healthy as she is relying on others to help her with meals.  She does have mild afternoon hypoglycemia, associated with meal timing.  - [name] has currently uncontrolled symptomatic type 2 DM since 50 years of age.   -Recent labs reviewed.  - I had a long discussion with her about the progressive nature of diabetes and the pathology behind its complications. -her diabetes is complicated by CAD (recentMI) and she remains at a high risk for more acute and chronic complications which include CAD, CVA, CKD, retinopathy, and neuropathy. These are all discussed in detail with her.  - Nutritional counseling repeated at each appointment due to patients tendency to fall back in to old habits.  - The patient admits there is a room for improvement in their diet and drink choices. -  Suggestion is made for the patient to avoid simple carbohydrates from their diet including Cakes, Sweet Desserts / Pastries, Ice Cream, Soda (diet and regular), Sweet Tea, Candies, Chips, Cookies, Sweet Pastries, Store Bought Juices, Alcohol in Excess of 1-2 drinks a day, Artificial Sweeteners, Coffee Creamer, and "Sugar-free" Products. This will help patient to have stable blood glucose profile and potentially avoid unintended weight gain.   - I encouraged the patient to switch to unprocessed or minimally processed complex starch and increased protein intake (animal or plant source), fruits, and vegetables.   - Patient is advised to stick to a routine mealtimes to eat 3 meals a day and avoid unnecessary snacks (to snack only to correct hypoglycemia).  - I have approached her with the following individualized plan to manage  her diabetes and patient agrees:   -She is advised to re-engage in healthy diet and increasing exercise  as she is able.  She is advised to continue Metformin 1000 mg ER to be taken daily with breakfast and Glipizide 5 mg XL daily with breakfast.  -She is encouraged to continue monitoring blood glucose twice daily, before breakfast and before bed, and to call the clinic if she has readings less than 70 or greater than 200 for 3 tests in a row.  - Adjustment parameters are given to her for hypo and hyperglycemia in writing.  - she is not a candidate for incretin therapy due to significantly elevated triglycerides and increased risk for pancreatitis.  - Specific targets for  A1c;  LDL, HDL,  and Triglycerides were discussed with the patient.  2) Blood Pressure /Hypertension:  her blood pressure is controlled to target.   she is advised to continue her current medications including Coreg 12.5 mg po twice daily and Losartan 50 mg p.o. daily with breakfast.  3) Lipids/Hyperlipidemia:    Review of her recent lipid panel from 05/19/20 showed controlled LDL at 25 and significantly improved triglycerides of 125.  she is advised to continue Lipitor 80 mg daily at bedtime and Omega 3 Krill oil caps po daily.  Side effects and precautions discussed with her.  4)  Weight/Diet:  her Body mass index is 36.78 kg/m.  -  clearly complicating her diabetes care.   she is a candidate for weight loss. I  discussed with her the fact that loss of 5 - 10% of her  current body weight will have the most impact on her diabetes management.  Exercise, and detailed carbohydrates information provided  -  detailed on discharge instructions.  5) Chronic Care/Health Maintenance: -she is on ACEI/ARB and Statin medications and is encouraged to initiate and continue to follow up with Ophthalmology, Dentist, Podiatrist at least yearly or according to recommendations, and advised to [place]. I have recommended yearly flu vaccine and pneumonia vaccine at least every 5 years; moderate intensity exercise for up to 150 minutes weekly; and  sleep for at least 7 hours a day.  - she is advised to maintain close follow up with Jani Gravel, MD for primary care needs, as well as her other providers for optimal and coordinated care.    -She is advised to establish care with podiatrist for care of her ingrown toenail.   I spent 30 minutes in the care of the patient today including review of labs from [facility], Lipids, Thyroid Function, Hematology (current and previous including abstractions from other facilities); face-to-face time discussing  her blood glucose readings/logs, discussing hypoglycemia and hyperglycemia episodes and symptoms, medications doses, her options of short and long term treatment based on the latest standards of care / guidelines;  discussion about incorporating lifestyle medicine;  and documenting the encounter.    Please refer to Patient Instructions for Blood Glucose Monitoring and Insulin/Medications Dosing Guide"  in media tab for additional information. Please  also refer to " Patient Self Inventory" in the Media  tab for reviewed elements of pertinent patient history.  [name] participated in the discussions, expressed understanding, and voiced agreement with the above plans.  All questions were answered to her satisfaction. she is encouraged to contact clinic should she have any questions or concerns prior to her return visit.   Follow up plan: - Return in about 4 months (around 04/09/2021) for Diabetes F/U with A1c in office, No previsit labs, Bring meter and logs.  [name], [facility] [address] [place],  [address] Phone: [phone] Fax: [phone]  12/08/2020, 4:15 PM

## 2020-12-08 NOTE — Patient Instructions (Addendum)
Medication Instructions:  Your physician recommends that you continue on your current medications as directed. Please refer to the Current Medication list given to you today.  Labwork: None  Testing/Procedures: None  Follow-Up: Your physician recommends that you schedule a follow-up appointment in: 6 month  Any Other Special Instructions Will Be Listed Below (If Applicable).  If you need a refill on your cardiac medications before your next appointment, please call your pharmacy.

## 2020-12-09 ENCOUNTER — Encounter (HOSPITAL_COMMUNITY): Payer: 59 | Admitting: Physical Therapy

## 2020-12-10 ENCOUNTER — Encounter (HOSPITAL_COMMUNITY): Payer: 59

## 2020-12-10 ENCOUNTER — Other Ambulatory Visit: Payer: Self-pay | Admitting: Orthopedic Surgery

## 2020-12-11 ENCOUNTER — Ambulatory Visit (HOSPITAL_COMMUNITY): Payer: 59 | Admitting: Physical Therapy

## 2020-12-11 ENCOUNTER — Other Ambulatory Visit: Payer: Self-pay

## 2020-12-11 DIAGNOSIS — M25562 Pain in left knee: Secondary | ICD-10-CM

## 2020-12-11 DIAGNOSIS — M25662 Stiffness of left knee, not elsewhere classified: Secondary | ICD-10-CM

## 2020-12-11 DIAGNOSIS — M6281 Muscle weakness (generalized): Secondary | ICD-10-CM

## 2020-12-11 DIAGNOSIS — R2689 Other abnormalities of gait and mobility: Secondary | ICD-10-CM

## 2020-12-11 NOTE — Therapy (Signed)
[place], [place], [address] Phone: [phone]   Fax:  [phone]  Physical Therapy Treatment  Patient Details  Name: [name] MRN: [id number] Date of Birth: Mar 25, 1971 Referring Provider (PT): Arther Abbott   Encounter Date: 12/11/2020   PT End of Session - 12/11/20 1447     Visit Number 12    Number of Visits 18    Date for PT Re-Evaluation 12/18/20    Authorization Type [employer] VL 30    Authorization - Visit Number 12    Authorization - Number of Visits 30    Progress Note Due on Visit 3    PT Start Time 1406    PT Stop Time [id number]    PT Time Calculation (min) 39 min    Activity Tolerance Patient tolerated treatment well    Behavior During Therapy [facility] for tasks assessed/performed             Past Medical History:  Diagnosis Date   Arthritis    Carpal tunnel syndrome, bilateral    Coronary artery disease    Diabetes mellitus without complication ([place])    no meds; diet controlled   Diverticulitis    Hyperlipidemia    Phreesia 08/10/2020   Hypertension    Hypertriglyceridemia    Myocardial infarction ([place])    Phreesia 08/10/2020   PONV (postoperative nausea and vomiting)    Sciatica     Past Surgical History:  Procedure Laterality Date   ablasion of uterus     CARDIAC CATHETERIZATION     CARPAL TUNNEL RELEASE  11/10/2010   Procedure: CARPAL TUNNEL RELEASE;  Surgeon: Arther Abbott, MD;  [facility]: [facility];  Service: Orthopedics;  Laterality: Right;   CHOLECYSTECTOMY     [facility]   COLON RESECTION N/A 04/08/2015   Procedure: LAPAROSCOPIC HAND ASSISTED PARTIAL COLECTOMY  CONVERTED TO OPEN AT [id number];  Surgeon: Aviva Signs, MD;  Location: [facility];  Service: General;  Laterality: N/A;   COLONOSCOPY N/A 03/28/2014   Procedure: COLONOSCOPY;  Surgeon: Rogene Houston, MD;  [facility];  Service: Endoscopy;  Laterality: N/A;  730   CORONARY STENT INTERVENTION  04/07/2020    CORONARY STENT INTERVENTION N/A 04/07/2020   Procedure: CORONARY STENT INTERVENTION;  Surgeon: Jettie Booze, MD;  Location: [place] [facility];  Service: Cardiovascular;  Laterality: N/A;   JOINT REPLACEMENT N/A    Phreesia 08/10/2020   KNEE ARTHROSCOPY WITH DRILLING/MICROFRACTURE Left 10/20/2016   Procedure: LEFT KNEE ARTHROSCOPY WITH MICROFRACTURE;  Surgeon: Carole Civil, MD;  Location: [facility];  Service: Orthopedics;  Laterality: Left;   KNEE ARTHROSCOPY WITH DRILLING/MICROFRACTURE Left 04/27/2017   Procedure: KNEE ARTHROSCOPY WITH DRILLING/MICROFRACTURE;  Surgeon: Carole Civil, MD;  Location: [facility];  Service: Orthopedics;  Laterality: Left;   KNEE ARTHROSCOPY WITH OSTEOCHONDRAL DEFECT REPAIR Left 04/27/2017   Procedure: KNEE ARTHROSCOPY WITH OSTEOCHONDRAL DEFECT REPAIR Autograft;  Surgeon: Carole Civil, MD;  Location: [facility];  Service: Orthopedics;  Laterality: Left;   KNEE SURGERY     LEFT HEART CATH AND CORONARY ANGIOGRAPHY N/A 04/07/2020   Procedure: LEFT HEART CATH AND CORONARY ANGIOGRAPHY;  Surgeon: Jettie Booze, MD;  Location: [place] [facility];  Service: Cardiovascular;  Laterality: N/A;   PARTIAL COLECTOMY N/A 04/08/2015   Procedure: PARTIAL COLECTOMY;  Surgeon: Aviva Signs, MD;  Location: [facility];  Service: General;  Laterality: N/A;   right knee arthroscopy     SMALL INTESTINE SURGERY N/A  Phreesia 08/10/2020   TOTAL KNEE ARTHROPLASTY Left 10/17/2017   Procedure: TOTAL KNEE ARTHROPLASTY;  Surgeon: Carole Civil, MD;  Location: [facility];  Service: Orthopedics;  Laterality: Left;  DePuy    TOTAL KNEE REVISION Left 10/20/2020   Procedure: REVISION TIBIAL COMPONENT LEFT TOTAL KNEE;  Surgeon: Carole Civil, MD;  Location: [facility];  Service: Orthopedics;  Laterality: Left;   TUBAL LIGATION      There were no vitals filed for this visit.   Subjective Assessment - 12/11/20 1408     Subjective Pt states she is doing really good.  No  real problems.    Pertinent History MI, LT TKR 2019; HTN    Limitations Sitting;Lifting;Standing;Walking;House hold activities    How long can you sit comfortably? 1-five minutes    How long can you stand comfortably? 10 minutes    How long can you walk comfortably? walking with a cane and a walker  she has not walked greater than a minute or two at a time on her own.  With therapy she was walking for about five minutes.    Patient Stated Goals less pain, move better, walk better.    Currently in Pain? Yes    Pain Location Knee    Pain Orientation Left    Pain Descriptors / Indicators Sore    Pain Onset 1 to 4 weeks ago    Pain Frequency Intermittent    Aggravating Factors  activity    Pain Relieving Factors ice                               [facility] Adult PT Treatment/Exercise - 12/11/20 0001       Ambulation/Gait   Stairs Yes    Stairs Assistance 6: Modified independent (Device/Increase time)    Number of Stairs 12    Height of Stairs 7      Exercises   Exercises Knee/Hip      Knee/Hip Exercises: Stretches   Active Hamstring Stretch Left;3 reps;20 seconds    Active Hamstring Stretch Limitations 12in step height    Knee: Self-Stretch to increase Flexion Left;3 reps;30 seconds    Gastroc Stretch 3 reps;30 seconds    Gastroc Stretch Limitations slant board      Knee/Hip Exercises: Aerobic   Stationary Bike 6'      Knee/Hip Exercises: Standing   Heel Raises 20 reps    Knee Flexion Left;10 reps    Knee Flexion Limitations 4    Terminal Knee Extension Left;10 reps    Rocker Board 2 minutes    SLS x3      Knee/Hip Exercises: Seated   Long Arc Quad Left;10 reps    Long Arc Quad Weight 4 lbs.    Sit to Sand 15 reps      Knee/Hip Exercises: Supine   Knee Extension Limitations 1    Knee Flexion Limitations 122      Manual Therapy   Manual Therapy Edema management;Soft tissue mobilization;Joint mobilization    Manual therapy comments done seperate  from all other aspects of treatment    Edema Management with decongestive techniques    Joint Mobilization patella mobilization                      PT Short Term Goals - 11/30/20 1624       PT SHORT TERM GOAL #1   Title Pt will be independent with  HEP and perform consistently in order to decrease pain and maximize ROM.    Time 3    Period Weeks    Status Achieved    Target Date 11/27/20      PT SHORT TERM GOAL #2   Title Pt will have improved L knee AROM from 4-120 deg to maximize gait.    Baseline 1-120    Time 3    Period Weeks    Status Achieved      PT SHORT TERM GOAL #3   Title PT pain in LT knee will be no greater than a 6/10 to allow pt to be waking only one time a night for better sleep.    Baseline reports occasional 5/10    Time 3    Period Weeks    Status Achieved      PT SHORT TERM GOAL #4   Title Pt will have 1/2 grade improvement in MMT throughout in order to maximize gait and functional mobility.    Time 3    Period Weeks    Status Achieved               PT Long Term Goals - 11/30/20 1624       PT LONG TERM GOAL #1   Title Pt will have improved L knee AROM from 0-125deg in order to further maximize gait as well as sitting tolerance and stair ambulation.    Baseline 1-120    Time 6    Period Weeks    Status On-going      PT LONG TERM GOAL #2   Title Pt will be able to perform 5xSTS in 10 sec or < with no UE and mechanics WFL to demo improved balance and functional strength.    Time 6    Period Weeks    Status Achieved      PT LONG TERM GOAL #3   Title Pt will be able to perform L SLS for 30 sec or > to demo improved balance and functional strength in order to maximize gait and stair ambulation.    Time 6    Period Weeks    Status Achieved      PT LONG TERM GOAL #4   Title Pt will be able to ambulate 637f during 3MWT with LRAD and gait WFL in order to maximize return to PLOF.    Baseline 452 ft no AD    Time 6    Period  Weeks    Status On-going      PT LONG TERM GOAL #5   Title Pt will have 1 grade improvement throughout BLE MMT in order to be able to go and down  up 4-8 steps in a reciprocal manner with one hand on railing    Time 6    Period Weeks    Status On-going      PT LONG TERM GOAL #6   Title PT pain in Lt knee to be no greater than a 3/10 to allow pt to be able to sleep throughout the night    Baseline 5/10    Time 6    Period Weeks    Status On-going                   Plan - 12/11/20 1448     Clinical Impression Statement Pt continues to progress well but has difficulty with steps especially controlling the eccentric desent.  Added rocker board, single leg stance and wt to hamstring  curl exercises/    Personal Factors and Comorbidities Past/Current Experience;Comorbidity 2    Comorbidities Lt TKR in 2018/ MI in December 2021    Examination-Activity Limitations Bathing;Bend;Carry;Caring for Others;Dressing;Hygiene/Grooming;Lift;Locomotion Level;Toileting;Stand;Stairs;Squat;Sleep;Sit    Examination-Participation Restrictions Church;Cleaning;Community Activity;Driving;Laundry;Meal Prep;Occupation;Shop;Yard Work    Stability/Clinical Decision Making Stable/Uncomplicated    Rehab Potential Good    PT Frequency 3x / week    PT Duration 6 weeks    PT Treatment/Interventions Therapeutic exercise;Therapeutic activities;Patient/family education;Passive range of motion;Balance training;Manual techniques    PT Next Visit Plan quad strengthening working on eccentric control. and for walking endurance without hyperextension.; 8/12: f/u with compression garment and answer any questions wiht donning.    PT Home Exercise Plan Eval:  sitting: LAQ, heelslide, supine quad set, heel slide and SLR    Consulted and Agree with Plan of Care Patient             Patient will benefit from skilled therapeutic intervention in order to improve the following deficits and impairments:  Abnormal gait,  Decreased activity tolerance, Decreased balance, Decreased mobility, Difficulty walking, Increased edema, Increased fascial restricitons, Pain, Decreased range of motion, Decreased strength  Visit Diagnosis: Stiffness of left knee, not elsewhere classified  Acute pain of left knee  Other abnormalities of gait and mobility  Muscle weakness (generalized)     Problem List Patient Active Problem List   Diagnosis Date Noted   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)    Chronic pain in left foot 08/31/2020   Coronary artery disease    Diabetes mellitus without complication (HCC)    Hypertension    Hypertriglyceridemia    Non-ST elevation (NSTEMI) myocardial infarction ([place])    Chest pain 04/06/2020   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    Chondral defect of condyle of right femur    S/P left knee arthroscopy 04/27/17 03/28/2017   S/P total knee replacement, 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    Abdominal pain 02/17/2015   Nausea without vomiting 02/17/2015   Diverticulitis of intestine with abscess 01/08/2015   Acute diverticulitis 01/08/2015   Nausea 12/31/2014   Diverticulitis 12/31/2014   Hyperglycemia 12/31/2014   Medication reaction 12/31/2014   Diverticulitis of large intestine without perforation or abscess without bleeding    Thrush, oral    Fatty liver 03/11/2014   Family hx of colon cancer requiring screening colonoscopy 03/11/2014   Rt flank pain 03/11/2014   Ulnar nerve compression 06/14/2011   Compartment syndrome, nontraumatic 06/08/2011   LOW BACK PAIN 11/07/2007   SCIATICA 11/07/2007   LUMBAR SPASM 11/07/2007   Rayetta Humphrey, PT CLT [phone] 12/11/2020, 2:50 PM  [place], [place], [address] Phone: [phone]   Fax:  [phone]  Name: FATME FILLINGHAM MRN:  [id number] Date of Birth: 03/11/1971

## 2020-12-14 ENCOUNTER — Ambulatory Visit (HOSPITAL_COMMUNITY): Payer: 59 | Admitting: Physical Therapy

## 2020-12-14 ENCOUNTER — Other Ambulatory Visit: Payer: Self-pay

## 2020-12-14 DIAGNOSIS — M25662 Stiffness of left knee, not elsewhere classified: Secondary | ICD-10-CM

## 2020-12-14 DIAGNOSIS — M6281 Muscle weakness (generalized): Secondary | ICD-10-CM

## 2020-12-14 DIAGNOSIS — M25562 Pain in left knee: Secondary | ICD-10-CM

## 2020-12-14 DIAGNOSIS — R2689 Other abnormalities of gait and mobility: Secondary | ICD-10-CM

## 2020-12-14 NOTE — Therapy (Signed)
[place], [place], [address] Phone: [phone]   Fax:  [phone]  Physical Therapy Treatment  Patient Details  Name: [name] MRN: [id number] Date of Birth: 1970-07-27 Referring Provider (PT): Arther Abbott   Encounter Date: 12/14/2020   PT End of Session - 12/14/20 1622     Visit Number 13    Number of Visits 18    Date for PT Re-Evaluation 12/18/20    Authorization Type [employer] VL 30    Authorization - Visit Number 13    Authorization - Number of Visits 30    Progress Note Due on Visit 15    PT Start Time [id number]    PT Stop Time [id number]    PT Time Calculation (min) 38 min    Activity Tolerance Patient tolerated treatment well    Behavior During Therapy [facility] for tasks assessed/performed             Past Medical History:  Diagnosis Date   Arthritis    Carpal tunnel syndrome, bilateral    Coronary artery disease    Diabetes mellitus without complication ([place])    no meds; diet controlled   Diverticulitis    Hyperlipidemia    Phreesia 08/10/2020   Hypertension    Hypertriglyceridemia    Myocardial infarction ([place])    Phreesia 08/10/2020   PONV (postoperative nausea and vomiting)    Sciatica     Past Surgical History:  Procedure Laterality Date   ablasion of uterus     CARDIAC CATHETERIZATION     CARPAL TUNNEL RELEASE  11/10/2010   Procedure: CARPAL TUNNEL RELEASE;  Surgeon: Arther Abbott, MD;  [facility];  Service: Orthopedics;  Laterality: Right;   CHOLECYSTECTOMY     [facility]   COLON RESECTION N/A 04/08/2015   Procedure: LAPAROSCOPIC HAND ASSISTED PARTIAL COLECTOMY  CONVERTED TO OPEN AT [id number];  Surgeon: Aviva Signs, MD;  Location: [facility];  Service: General;  Laterality: N/A;   COLONOSCOPY N/A 03/28/2014   Procedure: COLONOSCOPY;  Surgeon: Rogene Houston, MD;  [facility];  Service: Endoscopy;  Laterality: N/A;  730   CORONARY STENT INTERVENTION  04/07/2020    CORONARY STENT INTERVENTION N/A 04/07/2020   Procedure: CORONARY STENT INTERVENTION;  Surgeon: Jettie Booze, MD;  Location: [place] [facility];  Service: Cardiovascular;  Laterality: N/A;   JOINT REPLACEMENT N/A    Phreesia 08/10/2020   KNEE ARTHROSCOPY WITH DRILLING/MICROFRACTURE Left 10/20/2016   Procedure: LEFT KNEE ARTHROSCOPY WITH MICROFRACTURE;  Surgeon: Carole Civil, MD;  [facility];  Service: Orthopedics;  Laterality: Left;   KNEE ARTHROSCOPY WITH DRILLING/MICROFRACTURE Left 04/27/2017   Procedure: KNEE ARTHROSCOPY WITH DRILLING/MICROFRACTURE;  Surgeon: Carole Civil, MD;  Location: [facility];  Service: Orthopedics;  Laterality: Left;   KNEE ARTHROSCOPY WITH OSTEOCHONDRAL DEFECT REPAIR Left 04/27/2017   Procedure: KNEE ARTHROSCOPY WITH OSTEOCHONDRAL DEFECT REPAIR Autograft;  Surgeon: Carole Civil, MD;  [facility]: [facility];  Service: Orthopedics;  Laterality: Left;   KNEE SURGERY     LEFT HEART CATH AND CORONARY ANGIOGRAPHY N/A 04/07/2020   Procedure: LEFT HEART CATH AND CORONARY ANGIOGRAPHY;  Surgeon: Jettie Booze, MD;  Location: [place] [facility];  Service: Cardiovascular;  Laterality: N/A;   PARTIAL COLECTOMY N/A 04/08/2015   Procedure: PARTIAL COLECTOMY;  Surgeon: Aviva Signs, MD;  Location: [facility];  Service: General;  Laterality: N/A;   right knee arthroscopy     SMALL INTESTINE SURGERY N/A  Phreesia 08/10/2020   TOTAL KNEE ARTHROPLASTY Left 10/17/2017   Procedure: TOTAL KNEE ARTHROPLASTY;  Surgeon: Carole Civil, MD;  Location: [facility];  Service: Orthopedics;  Laterality: Left;  DePuy    TOTAL KNEE REVISION Left 10/20/2020   Procedure: REVISION TIBIAL COMPONENT LEFT TOTAL KNEE;  Surgeon: Carole Civil, MD;  Location: [facility];  Service: Orthopedics;  Laterality: Left;   TUBAL LIGATION      There were no vitals filed for this visit.   Subjective Assessment - 12/14/20 1622     Subjective Patient reports she returned to work for 4  hrs and that she is in a bit of discomfort today from work.    Pertinent History MI, LT TKR 2019; HTN    Limitations Sitting;Lifting;Standing;Walking;House hold activities    How long can you sit comfortably? 1-five minutes    How long can you stand comfortably? 10 minutes    How long can you walk comfortably? walking with a cane and a walker  she has not walked greater than a minute or two at a time on her own.  With therapy she was walking for about five minutes.    Patient Stated Goals less pain, move better, walk better.    Currently in Pain? Yes    Pain Score 1     Pain Location Knee    Pain Orientation Left    Pain Descriptors / Indicators Discomfort    Pain Type Surgical pain    Pain Onset 1 to 4 weeks ago                [facility] PT Assessment - 12/14/20 0001       Assessment   Medical Diagnosis Lt TKR revision    Referring Provider (PT) Arther Abbott    Onset Date/Surgical Date 10/20/20    Next MD Visit 12/30/20      Observation/Other Assessments   Observations swelling in left calf/lower leg.      Ambulation/Gait   Ambulation/Gait Yes    Ambulation Distance (Feet) 574 Feet    Assistive device None    Gait Pattern Decreased arm swing - left;Decreased dorsiflexion - left    Ambulation Surface Level;Indoor    Gait velocity decreased                           [facility] Adult PT Treatment/Exercise - 12/14/20 0001       Ambulation/Gait   Stairs Yes    Stairs Assistance 6: Modified independent (Device/Increase time)    Number of Stairs 4    Height of Stairs 7    Gait Comments 3MW; shorter stairs 7 of them at 4" no hand rails x5, x10 sets on the larger steps      Knee/Hip Exercises: Stretches   Other Knee/Hip Stretches knee flexion and extension stretch on 12 inch step 20" holds x5 each R      Knee/Hip Exercises: Aerobic   Stationary Bike 5 minutes on bike      Knee/Hip Exercises: Seated   Other Seated Knee/Hip Exercises self mobilization to  left LE - 5 minutes      Knee/Hip Exercises: Supine   Bridges 2 sets;10 reps    Straight Leg Raises Left;5 reps;2 sets   focus on TKE   Knee Extension AROM;Left    Knee Extension Limitations 2    Knee Flexion AROM;Left    Knee Flexion Limitations 120    Other Supine Knee/Hip Exercises  hamstring curls on ball for knee ROM 3 minutes with 15" holds for stretch                    PT Education - 12/14/20 1627     Education Details on use of compression garments at work, on current presentation, on POC. on getting up during the day.    Person(s) Educated Patient    Methods Explanation    Comprehension Verbalized understanding              PT Short Term Goals - 11/30/20 1624       PT SHORT TERM GOAL #1   Title Pt will be independent with HEP and perform consistently in order to decrease pain and maximize ROM.    Time 3    Period Weeks    Status Achieved    Target Date 11/27/20      PT SHORT TERM GOAL #2   Title Pt will have improved L knee AROM from 4-120 deg to maximize gait.    Baseline 1-120    Time 3    Period Weeks    Status Achieved      PT SHORT TERM GOAL #3   Title PT pain in LT knee will be no greater than a 6/10 to allow pt to be waking only one time a night for better sleep.    Baseline reports occasional 5/10    Time 3    Period Weeks    Status Achieved      PT SHORT TERM GOAL #4   Title Pt will have 1/2 grade improvement in MMT throughout in order to maximize gait and functional mobility.    Time 3    Period Weeks    Status Achieved               PT Long Term Goals - 12/14/20 1637       PT LONG TERM GOAL #1   Title Pt will have improved L knee AROM from 0-125deg in order to further maximize gait as well as sitting tolerance and stair ambulation.    Baseline 1-120    Time 6    Period Weeks    Status On-going      PT LONG TERM GOAL #2   Title Pt will be able to perform 5xSTS in 10 sec or < with no UE and mechanics WFL to demo  improved balance and functional strength.    Time 6    Period Weeks    Status Achieved      PT LONG TERM GOAL #3   Title Pt will be able to perform L SLS for 30 sec or > to demo improved balance and functional strength in order to maximize gait and stair ambulation.    Time 6    Period Weeks    Status Achieved      PT LONG TERM GOAL #4   Title Pt will be able to ambulate 613f during 3MWT with LRAD and gait WFL in order to maximize return to PLOF.    Baseline 574 ft no AD    Time 6    Period Weeks    Status On-going      PT LONG TERM GOAL #5   Title Pt will have 1 grade improvement throughout BLE MMT in order to be able to go and down  up 4-8 steps in a reciprocal manner with one hand on railing    Baseline see MMT- 2 railings,  poor eccentric control    Time 6    Period Weeks    Status On-going      PT LONG TERM GOAL #6   Title PT pain in Lt knee to be no greater than a 3/10 to allow pt to be able to sleep throughout the night    Baseline 5/10 nagging pain, sometimes still wakes her up    Time 6    Period Weeks    Status On-going                   Plan - 12/14/20 1622     Clinical Impression Statement Patient with increased pain today with recent return to work. Patient primarily sits all day at work. Educated patient in standing multiple times during he day to help with pain and tightness as well as wearing compression garments during the day. Reviewed goals and patient with still dome functional difficulties but overall is independent in HEP. Anticipate next session to be last session pending patient presentation.    Personal Factors and Comorbidities Past/Current Experience;Comorbidity 2    Comorbidities Lt TKR in 2018/ MI in December 2021    Examination-Activity Limitations Bathing;Bend;Carry;Caring for Others;Dressing;Hygiene/Grooming;Lift;Locomotion Level;Toileting;Stand;Stairs;Squat;Sleep;Sit    Examination-Participation Restrictions Church;Cleaning;Community  Activity;Driving;Laundry;Meal Prep;Occupation;Shop;Yard Work    Stability/Clinical Decision Making Stable/Uncomplicated    Rehab Potential Good    PT Frequency 3x / week    PT Duration 6 weeks    PT Treatment/Interventions Therapeutic exercise;Therapeutic activities;Patient/family education;Passive range of motion;Balance training;Manual techniques    PT Next Visit Plan anticipate DC, review HEP and any functional tasks patient would like to work on, eccentric strength.    PT Home Exercise Plan Eval:  sitting: LAQ, heelslide, supine quad set, heel slide and SLR    Consulted and Agree with Plan of Care Patient             Patient will benefit from skilled therapeutic intervention in order to improve the following deficits and impairments:  Abnormal gait, Decreased activity tolerance, Decreased balance, Decreased mobility, Difficulty walking, Increased edema, Increased fascial restricitons, Pain, Decreased range of motion, Decreased strength  Visit Diagnosis: Stiffness of left knee, not elsewhere classified  Acute pain of left knee  Other abnormalities of gait and mobility  Muscle weakness (generalized)     Problem List Patient Active Problem List   Diagnosis Date Noted   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)    Chronic pain in left foot 08/31/2020   Coronary artery disease    Diabetes mellitus without complication (HCC)    Hypertension    Hypertriglyceridemia    Non-ST elevation (NSTEMI) myocardial infarction ([place])    Chest pain 04/06/2020   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    Chondral defect of condyle of right femur    S/P left knee arthroscopy 04/27/17 03/28/2017   S/P total knee replacement, 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    Abdominal pain 02/17/2015   Nausea without vomiting 02/17/2015   Diverticulitis of intestine with abscess  01/08/2015   Acute diverticulitis 01/08/2015   Nausea 12/31/2014   Diverticulitis 12/31/2014   Hyperglycemia 12/31/2014   Medication reaction 12/31/2014   Diverticulitis of large intestine without perforation or abscess without bleeding    Thrush, oral    Fatty liver 03/11/2014   Family hx of colon cancer requiring screening colonoscopy 03/11/2014   Rt  flank pain 03/11/2014   Ulnar nerve compression 06/14/2011   Compartment syndrome, nontraumatic 06/08/2011   LOW BACK PAIN 11/07/2007   SCIATICA 11/07/2007   LUMBAR SPASM 11/07/2007   4:55 PM, 12/14/20 Jerene Pitch, DPT Physical Therapy with [facility]  [phone] office   [place] [address] [place], [place], [address] Phone: [phone]   Fax:  [phone]  Name: [name] MRN: [id number] Date of Birth: 1970/09/15

## 2020-12-16 ENCOUNTER — Encounter (HOSPITAL_COMMUNITY): Payer: 59 | Admitting: Physical Therapy

## 2020-12-17 ENCOUNTER — Encounter (HOSPITAL_COMMUNITY): Payer: 59

## 2020-12-18 ENCOUNTER — Encounter (HOSPITAL_COMMUNITY): Payer: Self-pay

## 2020-12-18 ENCOUNTER — Other Ambulatory Visit: Payer: Self-pay

## 2020-12-18 ENCOUNTER — Ambulatory Visit (HOSPITAL_COMMUNITY): Payer: 59

## 2020-12-18 DIAGNOSIS — M25662 Stiffness of left knee, not elsewhere classified: Secondary | ICD-10-CM | POA: Diagnosis not present

## 2020-12-18 DIAGNOSIS — M6281 Muscle weakness (generalized): Secondary | ICD-10-CM

## 2020-12-18 DIAGNOSIS — R2689 Other abnormalities of gait and mobility: Secondary | ICD-10-CM

## 2020-12-18 DIAGNOSIS — M25562 Pain in left knee: Secondary | ICD-10-CM

## 2020-12-18 NOTE — Therapy (Addendum)
[place], [place], [address] Phone: [phone]   Fax:  [phone]  Physical Therapy Treatment  Patient Details  Name: [name] MRN: [id number] Date of Birth: 1971-03-09 Referring Provider (PT): [facility] THERAPY DISCHARGE SUMMARY  Visits from Start of Care: 13  Current functional level related to goals / functional outcomes: Goals partially met   Remaining deficits: See below   Education / Equipment: HEP   Patient agrees to discharge. Patient goals were met. Patient is being discharged due to being pleased with the current functional level.   Encounter Date: 12/18/2020  13/13 visits [id number]   Past Medical History:  Diagnosis Date   Arthritis    Carpal tunnel syndrome, bilateral    Coronary artery disease    Diabetes mellitus without complication ([place])    no meds; diet controlled   Diverticulitis    Hyperlipidemia    Phreesia 08/10/2020   Hypertension    Hypertriglyceridemia    Myocardial infarction ([place])    Phreesia 08/10/2020   PONV (postoperative nausea and vomiting)    Sciatica     Past Surgical History:  Procedure Laterality Date   ablasion of uterus     CARDIAC CATHETERIZATION     CARPAL TUNNEL RELEASE  11/10/2010   Procedure: CARPAL TUNNEL RELEASE;  Surgeon: Arther Abbott, MD;  Location: [facility];  Service: Orthopedics;  Laterality: Right;   CHOLECYSTECTOMY     [facility]   COLON RESECTION N/A 04/08/2015   Procedure: LAPAROSCOPIC HAND ASSISTED PARTIAL COLECTOMY  CONVERTED TO OPEN AT 2353;  Surgeon: Aviva Signs, MD;  Location: [facility];  Service: General;  Laterality: N/A;   COLONOSCOPY N/A 03/28/2014   Procedure: COLONOSCOPY;  Surgeon: Rogene Houston, MD;  Location: [facility];  Service: Endoscopy;  Laterality: N/A;  730   CORONARY STENT INTERVENTION  04/07/2020   CORONARY STENT INTERVENTION N/A 04/07/2020   Procedure: CORONARY STENT INTERVENTION;  Surgeon:  Jettie Booze, MD;  Location: [facility];  Service: Cardiovascular;  Laterality: N/A;   JOINT REPLACEMENT N/A    Phreesia 08/10/2020   KNEE ARTHROSCOPY WITH DRILLING/MICROFRACTURE Left 10/20/2016   Procedure: LEFT KNEE ARTHROSCOPY WITH MICROFRACTURE;  Surgeon: Carole Civil, MD;  Location: [facility];  Service: Orthopedics;  Laterality: Left;   KNEE ARTHROSCOPY WITH DRILLING/MICROFRACTURE Left 04/27/2017   Procedure: KNEE ARTHROSCOPY WITH DRILLING/MICROFRACTURE;  Surgeon: Carole Civil, MD;  Location: [facility];  Service: Orthopedics;  Laterality: Left;   KNEE ARTHROSCOPY WITH OSTEOCHONDRAL DEFECT REPAIR Left 04/27/2017   Procedure: KNEE ARTHROSCOPY WITH OSTEOCHONDRAL DEFECT REPAIR Autograft;  Surgeon: Carole Civil, MD;  Location: [facility];  Service: Orthopedics;  Laterality: Left;   KNEE SURGERY     LEFT HEART CATH AND CORONARY ANGIOGRAPHY N/A 04/07/2020   Procedure: LEFT HEART CATH AND CORONARY ANGIOGRAPHY;  Surgeon: Jettie Booze, MD;  Location: [facility];  Service: Cardiovascular;  Laterality: N/A;   PARTIAL COLECTOMY N/A 04/08/2015   Procedure: PARTIAL COLECTOMY;  Surgeon: Aviva Signs, MD;  Location: [facility];  Service: General;  Laterality: N/A;   right knee arthroscopy     SMALL INTESTINE SURGERY N/A    Phreesia 08/10/2020   TOTAL KNEE ARTHROPLASTY Left 10/17/2017   Procedure: TOTAL KNEE ARTHROPLASTY;  Surgeon: Carole Civil, MD;  Location: [facility];  Service: Orthopedics;  Laterality: Left;  [place]    TOTAL KNEE REVISION Left 10/20/2020   Procedure: REVISION TIBIAL COMPONENT LEFT TOTAL KNEE;  Surgeon:  Carole Civil, MD;  Location: [facility];  Service: Orthopedics;  Laterality: Left;   TUBAL LIGATION      There were no vitals filed for this visit.   Subjective Assessment - 12/18/20 1533     Subjective Pt reports knee is stiff at entrance, minimal discomfort today, pain distal knee.    Pertinent History MI, LT TKR 2019; HTN    Patient  Stated Goals less pain, move better, walk better.    Currently in Pain? Yes    Pain Score 1                 [facility] PT Assessment - 12/18/20 0001       Assessment   Medical Diagnosis Lt TKR revision    Referring Provider (PT) Arther Abbott    Onset Date/Surgical Date 10/20/20    Next MD Visit 12/30/20    Prior Therapy hospital      Observation/Other Assessments   Focus on Therapeutic Outcomes (FOTO)  72.5% function   was 53% functional     ROM / Strength   AROM / PROM / Strength AROM;Strength      AROM   AROM Assessment Site Knee    Right/Left Knee Left    Left Knee Extension --   was 8   Left Knee Flexion --   was 104     Strength   Strength Assessment Site Hip;Knee;Ankle    Right/Left Hip Right;Left    Right Hip Flexion 5/5   on 11/30/20 was 5/5   Right Hip Extension 5/5   on 11/30/20 was 5/5   Right Hip ABduction 5/5   on 11/30/20 was 5/5   Left Hip Flexion 4+/5   on 11/30/20 was 4/5   Left Hip Extension 4+/5   on 11/30/20 was 5/5   Left Hip ABduction 5/5   on 11/30/20 was 5/5   Right/Left Knee Right;Left    Right Knee Flexion 5/5   on 11/30/20 was 5/5   Right Knee Extension 5/5   on 11/30/20 was 5/5   Left Knee Flexion 5/5   on 11/30/20 was 5/5   Left Knee Extension 4+/5   on 11/30/20 was 4-/5   Right/Left Ankle Right;Left    Right Ankle Dorsiflexion 5/5   on 11/30/20 was 5/5   Left Ankle Dorsiflexion 5/5   on 11/30/20 was 5/5     Transfers   Five time sit to stand comments  8.10   was 9.13 seconds no UE support     Ambulation/Gait   Ambulation Distance (Feet) 418 Feet    Assistive device None    Gait Pattern Step-through pattern;Decreased dorsiflexion - left    Stairs Yes    Stairs Assistance 6: Modified independent (Device/Increase time)    Number of Stairs 20   5 sets   Height of Stairs 7    Gait Comments 2MWT      Static Standing Balance   Static Standing Balance -  Activities  Single Leg Stance - Right Leg;Single Leg Stance - Left Leg                                      PT Short Term Goals - 12/18/20 1557       PT SHORT TERM GOAL #1   Title Pt will be independent with HEP and perform consistently in order to decrease pain and maximize ROM.  Baseline 12/18/20: Reports compliance with advance HEP.  11/29/17: Patient reported regular compliance with HEP.    Status Achieved      PT SHORT TERM GOAL #2   Title Pt will have improved L knee AROM from 4-120 deg to maximize gait.    Baseline 12/18/20:  0-118, has been able to acheive 120 in past sessions.  1-120    Status Achieved      PT SHORT TERM GOAL #3   Title PT pain in LT knee will be no greater than a 6/10 to allow pt to be waking only one time a night for better sleep.    Baseline 8/26: Reports occasional ability to sleep at night without pain, does continues to have some discomfort with ability to sleep 3 hrs.  reports occasional 5/10    Status Partially Met      PT SHORT TERM GOAL #4   Title Pt will have 1/2 grade improvement in MMT throughout in order to maximize gait and functional mobility.    Baseline 12/18/20: see MMT    Status Achieved               PT Long Term Goals - 12/18/20 1600       PT LONG TERM GOAL #1   Title Pt will have improved L knee AROM from 0-125deg in order to further maximize gait as well as sitting tolerance and stair ambulation.    Baseline 0-120, ability to sit comfortably and ability to demonstrate reciprocal pattern on stairs.  1-120    Status Partially Met      PT LONG TERM GOAL #2   Title Pt will be able to perform 5xSTS in 10 sec or < with no UE and mechanics WFL to demo improved balance and functional strength.    Baseline 12/18/20: 8.10" 5 STS no HHA    Status Achieved      PT LONG TERM GOAL #3   Title Pt will be able to perform L SLS for 30 sec or > to demo improved balance and functional strength in order to maximize gait and stair ambulation.    Baseline 12/18/20: Lt SLS 60"+; 11/29/17: Patient performed  SLS on the left for more than 30 seconds    Status Achieved      PT LONG TERM GOAL #4   Title Pt will be able to ambulate 659f during 3MWT with LRAD and gait WFL in order to maximize return to PLOF.    Baseline 8/26: 2MWT 4139f ability to complete 574 ft no AD on 12/14/20    Status On-going      PT LONG TERM GOAL #5   Title Pt will have 1 grade improvement throughout BLE MMT in order to be able to go and down  up 4-8 steps in a reciprocal manner with one hand on railing    Baseline 12/18/20: see MMT, ability to complete reciprocal pattern with one HHA ascending, 2 descending    Status Partially Met      PT LONG TERM GOAL #6   Title PT pain in Lt knee to be no greater than a 3/10 to allow pt to be able to sleep throughout the night    Baseline 12/18/20: continues to have nagging pain at night, pain scale range from 1-5/10 throughout the night    Status On-going                   Plan - 12/18/20 1612     Clinical  Impression Statement Reviewed goals with objective testing including: 2MWT, MMT, ROM, and FOTO with the following findings.  Pt able to ambulate faster pace with good mechanics and no AD, MMT improved to 4+ or 5/5 for all of Lt LE, current AROM 0-118 though pt has acheived 120 in past sessions.  Improved self perceived functional abilities with FOTO score at 72.5% was 53% initially.  Pt continues to c/o discomfort at night with pain scale 1-5/10.  Demonstrates increased UE support ascending and descending stairs though quad strength at 4+/5.  Pt stated it has been 3 years since she has completed stairs and feels it's more mental than physical impairements.  Added stair training to HEP to improve confidence with task, encouraged to complete with HHA for safety.  Pt stated she feels 100% improvements since beginning therapy.  DC to HEP.    Personal Factors and Comorbidities Past/Current Experience;Comorbidity 2    Comorbidities Lt TKR in 2018/ MI in December 2021     Examination-Activity Limitations Bathing;Bend;Carry;Caring for Others;Dressing;Hygiene/Grooming;Lift;Locomotion Level;Toileting;Stand;Stairs;Squat;Sleep;Sit    Examination-Participation Restrictions Church;Cleaning;Community Activity;Driving;Laundry;Meal Prep;Occupation;Shop;Yard Work    Stability/Clinical Decision Making Stable/Uncomplicated    Clinical Decision Making Moderate    Rehab Potential Good    PT Frequency 3x / week    PT Duration 6 weeks    PT Treatment/Interventions Therapeutic exercise;Therapeutic activities;Patient/family education;Passive range of motion;Balance training;Manual techniques    PT Next Visit Plan DC to HEP.    PT Home Exercise Plan Eval:  sitting: LAQ, heelslide, supine quad set, heel slide and SLR    Consulted and Agree with Plan of Care Patient             Patient will benefit from skilled therapeutic intervention in order to improve the following deficits and impairments:  Abnormal gait, Decreased activity tolerance, Decreased balance, Decreased mobility, Difficulty walking, Increased edema, Increased fascial restricitons, Pain, Decreased range of motion, Decreased strength  Visit Diagnosis: Stiffness of left knee, not elsewhere classified  Other abnormalities of gait and mobility  Acute pain of left knee  Muscle weakness (generalized)     Problem List Patient Active Problem List   Diagnosis Date Noted   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)    Chronic pain in left foot 08/31/2020   Coronary artery disease    Diabetes mellitus without complication (HCC)    Hypertension    Hypertriglyceridemia    Non-ST elevation (NSTEMI) myocardial infarction ([place])    Chest pain 04/06/2020   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    Chondral defect of condyle of right femur    S/P left knee arthroscopy 04/27/17 03/28/2017   S/P total knee replacement,  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    Abdominal pain 02/17/2015   Nausea without vomiting 02/17/2015   Diverticulitis of intestine with abscess 01/08/2015   Acute diverticulitis 01/08/2015   Nausea 12/31/2014   Diverticulitis 12/31/2014   Hyperglycemia 12/31/2014   Medication reaction 12/31/2014   Diverticulitis of large intestine without perforation or abscess without bleeding    Thrush, oral    Fatty liver 03/11/2014   Family hx of colon cancer requiring screening colonoscopy 03/11/2014   Rt flank pain 03/11/2014   Ulnar nerve compression 06/14/2011   Compartment syndrome, nontraumatic 06/08/2011   LOW BACK PAIN 11/07/2007   SCIATICA 11/07/2007   LUMBAR SPASM 11/07/2007   Ihor Austin, LPTA/CLT; Delana Meyer [phone]  Rayetta Humphrey, PT CLT [phone]    12/18/2020, 4:30 PM  [facility] [address] [place], [place], [address] Phone: [phone]   Fax:  [phone]  Name: [name] MRN: [id number] Date of Birth: 30-Apr-1970

## 2020-12-22 ENCOUNTER — Other Ambulatory Visit: Payer: Self-pay

## 2020-12-22 ENCOUNTER — Ambulatory Visit (INDEPENDENT_AMBULATORY_CARE_PROVIDER_SITE_OTHER): Payer: 59 | Admitting: Podiatry

## 2020-12-22 DIAGNOSIS — L600 Ingrowing nail: Secondary | ICD-10-CM | POA: Diagnosis not present

## 2020-12-22 NOTE — Patient Instructions (Signed)

## 2020-12-23 NOTE — Progress Notes (Signed)
  Subjective:  Patient ID: [name], female    DOB: 12/30/1970,  MRN: [id number]  Chief Complaint  Patient presents with   Ingrown Toenail    Right hallux- had ingrown removed 8 years ago and its come back     50 y.o. female presents with the above complaint. History confirmed with patient.   Objective:  Physical Exam: warm, good capillary refill, no trophic changes or ulcerative lesions, normal DP and PT pulses, and normal sensory exam.  Ingrown right hallux  Assessment:  No diagnosis found.   Plan:  Patient was evaluated and treated and all questions answered.    Ingrown Nail, right -Patient elects to proceed with minor surgery to remove ingrown toenail today. Consent reviewed and signed by patient. -Ingrown nail excised. See procedure note. -Educated on post-procedure care including soaking. Written instructions provided and reviewed.  Procedure: Excision of Ingrown Toenail Location: Right 1st toe nail borders. Anesthesia: Lidocaine 1% plain; 1.5 mL and Marcaine 0.5% plain; 1.5 mL, digital block. Skin Prep: Betadine. Dressing: Silvadene; telfa; dry, sterile, compression dressing. Technique: Following skin prep, the toe was exsanguinated and a tourniquet was secured at the base of the toe. The affected nail border was freed, split with a nail splitter, and excised. The tourniquet was then removed and sterile dressing applied. Disposition: Patient tolerated procedure well. Patient to return in 2 weeks for follow-up.    Return if symptoms worsen or fail to improve.

## 2020-12-24 ENCOUNTER — Telehealth: Payer: Self-pay | Admitting: Orthopedic Surgery

## 2020-12-24 MED ORDER — OXYCODONE-ACETAMINOPHEN 10-325 MG PO TABS: 1.0000 | ORAL_TABLET | Freq: Four times a day (QID) | ORAL | 0 refills | Status: DC | PRN

## 2020-12-24 NOTE — Telephone Encounter (Signed)
Patient called requesting refill for her pain medicine.    oxyCODONE-acetaminophen (PERCOCET) 10-325 MG     Pharmacy  [facility]

## 2020-12-30 ENCOUNTER — Other Ambulatory Visit: Payer: Self-pay

## 2020-12-30 ENCOUNTER — Ambulatory Visit (INDEPENDENT_AMBULATORY_CARE_PROVIDER_SITE_OTHER): Payer: 59 | Admitting: Orthopedic Surgery

## 2020-12-30 ENCOUNTER — Encounter: Payer: Self-pay | Admitting: Orthopedic Surgery

## 2020-12-30 DIAGNOSIS — Z96652 Presence of left artificial knee joint: Secondary | ICD-10-CM

## 2020-12-30 MED ORDER — TRAMADOL HCL 50 MG PO TABS: 50.0000 mg | ORAL_TABLET | Freq: Four times a day (QID) | ORAL | 0 refills | Status: DC | PRN

## 2020-12-30 MED ORDER — OXYCODONE-ACETAMINOPHEN 10-325 MG PO TABS: 1.0000 | ORAL_TABLET | ORAL | 0 refills | Status: DC | PRN

## 2020-12-30 NOTE — Progress Notes (Signed)
Chief Complaint  Patient presents with   Routine Post Op    DOS 10/20/20   The patient is status post revision of tibial component  10 weeks (71 days)  She still complains of some soreness and pain in her knee especially at night [place] she will take an oxycodone she is tried relied on the tramadol  She does have full extension 115 degrees of flexion good stability in all planes tenderness in the quadriceps and pes tendons  Recommend she continue current treatment and come back in 6 weeks  Medications were refilled Meds ordered this encounter  Medications   oxyCODONE-acetaminophen (PERCOCET) 10-325 MG tablet    Sig: Take 1 tablet by mouth every 4 (four) hours as needed for pain.    Dispense:  30 tablet    Refill:  0   traMADol (ULTRAM) 50 MG tablet    Sig: Take 1 tablet (50 mg total) by mouth every 6 (six) hours as needed.    Dispense:  28 tablet    Refill:  0

## 2021-01-13 ENCOUNTER — Other Ambulatory Visit: Payer: Self-pay | Admitting: Orthopedic Surgery

## 2021-01-13 DIAGNOSIS — Z96652 Presence of left artificial knee joint: Secondary | ICD-10-CM

## 2021-01-13 MED ORDER — TRAMADOL HCL 50 MG PO TABS: 50.0000 mg | ORAL_TABLET | Freq: Four times a day (QID) | ORAL | 0 refills | Status: DC | PRN

## 2021-01-13 MED ORDER — OXYCODONE-ACETAMINOPHEN 10-325 MG PO TABS: 1.0000 | ORAL_TABLET | ORAL | 0 refills | Status: DC | PRN

## 2021-01-13 NOTE — Telephone Encounter (Signed)
Patient called requesting refill for her pain medicine.     oxyCODONE-acetaminophen (PERCOCET) 10-325 MG tablet  traMADol (ULTRAM) 50 MG tablet  Pharmacy: [facility] in [place]

## 2021-01-25 ENCOUNTER — Other Ambulatory Visit: Payer: Self-pay | Admitting: Orthopedic Surgery

## 2021-01-25 ENCOUNTER — Other Ambulatory Visit: Payer: Self-pay | Admitting: Radiology

## 2021-01-25 DIAGNOSIS — Z96652 Presence of left artificial knee joint: Secondary | ICD-10-CM

## 2021-01-25 MED ORDER — OXYCODONE-ACETAMINOPHEN 7.5-325 MG PO TABS: 1.0000 | ORAL_TABLET | ORAL | 0 refills | Status: DC | PRN

## 2021-01-25 NOTE — Progress Notes (Signed)
Meds ordered this encounter  Medications   oxyCODONE-acetaminophen (PERCOCET) 7.5-325 MG tablet    Sig: Take 1 tablet by mouth every 4 (four) hours as needed for severe pain.    Dispense:  30 tablet    Refill:  0

## 2021-02-07 ENCOUNTER — Other Ambulatory Visit: Payer: Self-pay | Admitting: Family Medicine

## 2021-02-08 ENCOUNTER — Other Ambulatory Visit: Payer: Self-pay

## 2021-02-08 MED ORDER — LOSARTAN POTASSIUM 50 MG PO TABS: 50.0000 mg | ORAL_TABLET | Freq: Every day | ORAL | 1 refills | Status: DC

## 2021-02-08 NOTE — Telephone Encounter (Signed)
Losartan 50 mg qd #90 refilled

## 2021-02-10 ENCOUNTER — Encounter: Payer: Self-pay | Admitting: Orthopedic Surgery

## 2021-02-10 ENCOUNTER — Ambulatory Visit (INDEPENDENT_AMBULATORY_CARE_PROVIDER_SITE_OTHER): Payer: 59 | Admitting: Orthopedic Surgery

## 2021-02-10 ENCOUNTER — Telehealth: Payer: Self-pay | Admitting: Orthopedic Surgery

## 2021-02-10 ENCOUNTER — Telehealth: Payer: Self-pay | Admitting: Cardiology

## 2021-02-10 ENCOUNTER — Other Ambulatory Visit: Payer: Self-pay

## 2021-02-10 VITALS — BP 151/83 | HR 89 | Ht 65.0 in | Wt 225.0 lb

## 2021-02-10 DIAGNOSIS — Z96652 Presence of left artificial knee joint: Secondary | ICD-10-CM

## 2021-02-10 DIAGNOSIS — M7052 Other bursitis of knee, left knee: Secondary | ICD-10-CM | POA: Diagnosis not present

## 2021-02-10 MED ORDER — OXYCODONE-ACETAMINOPHEN 7.5-325 MG PO TABS: 1.0000 | ORAL_TABLET | Freq: Four times a day (QID) | ORAL | 0 refills | Status: AC | PRN

## 2021-02-10 MED ORDER — ATORVASTATIN CALCIUM 40 MG PO TABS: 40.0000 mg | ORAL_TABLET | Freq: Every day | ORAL | 2 refills | Status: DC

## 2021-02-10 NOTE — Telephone Encounter (Signed)
Patient called back after today's office visit and relayed that her Oxycodone/Percocet prescription was received at her pharmacy, but said her second medication, Tramadol, was not ordered/  - [facility] in [place]

## 2021-02-10 NOTE — Progress Notes (Signed)
Chief Complaint  Patient presents with   Post-op Follow-up    Left knee 10/20/20 has pain constant. Painful with any activity     The patient is status post revision of tibial component    Unfortunately [name] still having quite a bit of difficulty with her left knee status post revision of the tibial component.  She still has pain in the proximal tibia especially on the medial side but also complains of diffuse aching of the knee especially with increased activity.  She says she is always aware that there is discomfort in the knee.  It feels like an aching sensation  She denies fever chills swelling  She takes 1-2 Percocet per day along with her tramadol and Robaxin  Her exam shows tenderness over the pes tendons no joint effusion she seems to be stable in extension and flexion with a little bit of trace laxity there.  I do not see any signs of infection.  The knee seems to be balanced.  Unclear at this time why she is continue to have trouble  I would like to offer her an injection for the pes tendinitis.  Procedure note  Injection  Verbal consent was obtained to inject the left knee pes tendon bursa  Timeout procedure was completed to confirm injection site  Diagnosis pes tendinitis left knee  Medications used Celestone Lidocaine 1% plain 3 cc  Anesthesia was provided by ethyl chloride spray  Prep was performed with alcohol  Technique of injection pes tendon bursa near the tibial tubercle injected with direct technique  No complications were noted  Meds ordered this encounter  Medications   oxyCODONE-acetaminophen (PERCOCET) 7.5-325 MG tablet    Sig: Take 1 tablet by mouth every 6 (six) hours as needed for up to 5 days for severe pain.    Dispense:  20 tablet    Refill:  0   Encounter Diagnoses  Name Primary?   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/20/20 Yes   Pes anserinus bursitis of left knee

## 2021-02-10 NOTE — Telephone Encounter (Signed)
Needing new Rx for atorvastatin (LIPITOR) 40 MG tablet [[id number]] sent to [facility], 90 day supply    they have the old Rx on file for 80mg 

## 2021-02-11 ENCOUNTER — Other Ambulatory Visit: Payer: Self-pay | Admitting: Orthopedic Surgery

## 2021-02-11 DIAGNOSIS — Z96652 Presence of left artificial knee joint: Secondary | ICD-10-CM

## 2021-02-11 MED ORDER — TRAMADOL HCL 50 MG PO TABS: 50.0000 mg | ORAL_TABLET | Freq: Four times a day (QID) | ORAL | 0 refills | Status: DC | PRN

## 2021-02-11 NOTE — Progress Notes (Signed)
Meds ordered this encounter  Medications   traMADol (ULTRAM) 50 MG tablet    Sig: Take 1 tablet (50 mg total) by mouth every 6 (six) hours as needed.    Dispense:  28 tablet    Refill:  0

## 2021-02-15 ENCOUNTER — Ambulatory Visit (INDEPENDENT_AMBULATORY_CARE_PROVIDER_SITE_OTHER): Payer: 59 | Admitting: Podiatry

## 2021-02-15 ENCOUNTER — Other Ambulatory Visit: Payer: Self-pay

## 2021-02-15 DIAGNOSIS — L600 Ingrowing nail: Secondary | ICD-10-CM

## 2021-02-15 NOTE — Progress Notes (Signed)
  Subjective:  Patient ID: [name], female    DOB: July 30, 1970,  MRN: [id number]  Chief Complaint  Patient presents with   Ingrown Toenail    left foot ingrown    50 y.o. female presents with the above complaint. History confirmed with patient. The right side is doing well, the left side is now painful and has an elevated ridge in the middle   Objective:  Physical Exam: warm, good capillary refill, no trophic changes or ulcerative lesions, normal DP and PT pulses, and normal sensory exam.  Ingrown left  hallux with dystrophy Assessment:   1. Ingrowing left great toenail      Plan:  Patient was evaluated and treated and all questions answered.    Ingrown Nail, right -Patient elects to proceed with minor surgery to remove ingrown toenail today. Consent reviewed and signed by patient. -Ingrown nail excised. See procedure note. -Educated on post-procedure care including soaking. Written instructions provided and reviewed.  Procedure: Excision of Ingrown Toenail Location: Right 1st toe nail borders. Anesthesia: Lidocaine 1% plain; 1.5 mL and Marcaine 0.5% plain; 1.5 mL, digital block. Skin Prep: Betadine. Dressing: Silvadene; telfa; dry, sterile, compression dressing. Technique: Following skin prep, the toe was exsanguinated and a tourniquet was secured at the base of the toe. The affected nail was freed, and excised. The tourniquet was then removed and sterile dressing applied. Disposition: Patient tolerated procedure well.

## 2021-02-15 NOTE — Patient Instructions (Signed)

## 2021-02-17 ENCOUNTER — Other Ambulatory Visit: Payer: Self-pay | Admitting: Orthopedic Surgery

## 2021-02-17 NOTE — Telephone Encounter (Signed)
Patient requests refill:  oxyCODONE-acetaminophen (PERCOCET) 7.5-325 MG tablet [name]

## 2021-02-18 MED ORDER — OXYCODONE-ACETAMINOPHEN 7.5-325 MG PO TABS: 1.0000 | ORAL_TABLET | ORAL | 0 refills | Status: DC | PRN

## 2021-02-18 NOTE — Telephone Encounter (Signed)
Patient called wondering about her prescription.  The pharmacy doesn't have it yet they said.

## 2021-02-21 ENCOUNTER — Other Ambulatory Visit: Payer: Self-pay | Admitting: Orthopedic Surgery

## 2021-02-24 ENCOUNTER — Other Ambulatory Visit: Payer: Self-pay | Admitting: Orthopedic Surgery

## 2021-02-24 MED ORDER — OXYCODONE-ACETAMINOPHEN 7.5-325 MG PO TABS: 1.0000 | ORAL_TABLET | ORAL | 0 refills | Status: DC | PRN

## 2021-02-24 NOTE — Telephone Encounter (Signed)
Patient requests refill oxyCODONE-acetaminophen (PERCOCET) 7.5-325 MG tablet 30 tablet     [facility]

## 2021-02-27 ENCOUNTER — Other Ambulatory Visit: Payer: Self-pay | Admitting: Nurse Practitioner

## 2021-02-27 DIAGNOSIS — E1165 Type 2 diabetes mellitus with hyperglycemia: Secondary | ICD-10-CM

## 2021-03-10 ENCOUNTER — Other Ambulatory Visit: Payer: Self-pay

## 2021-03-10 ENCOUNTER — Ambulatory Visit (INDEPENDENT_AMBULATORY_CARE_PROVIDER_SITE_OTHER): Payer: 59 | Admitting: Orthopedic Surgery

## 2021-03-10 ENCOUNTER — Ambulatory Visit: Payer: 59

## 2021-03-10 ENCOUNTER — Ambulatory Visit (INDEPENDENT_AMBULATORY_CARE_PROVIDER_SITE_OTHER): Payer: 59

## 2021-03-10 ENCOUNTER — Ambulatory Visit
Admission: EM | Admit: 2021-03-10 | Discharge: 2021-03-10 | Disposition: A | Discharge status: Home / Self Care | Payer: 59 | Attending: Family Medicine | Admitting: Family Medicine

## 2021-03-10 DIAGNOSIS — G8929 Other chronic pain: Secondary | ICD-10-CM | POA: Diagnosis not present

## 2021-03-10 DIAGNOSIS — R0602 Shortness of breath: Secondary | ICD-10-CM

## 2021-03-10 DIAGNOSIS — R002 Palpitations: Secondary | ICD-10-CM | POA: Diagnosis not present

## 2021-03-10 DIAGNOSIS — M25562 Pain in left knee: Secondary | ICD-10-CM | POA: Diagnosis not present

## 2021-03-10 DIAGNOSIS — R051 Acute cough: Secondary | ICD-10-CM | POA: Diagnosis not present

## 2021-03-10 DIAGNOSIS — Z96652 Presence of left artificial knee joint: Secondary | ICD-10-CM

## 2021-03-10 DIAGNOSIS — R062 Wheezing: Secondary | ICD-10-CM

## 2021-03-10 MED ORDER — TRAMADOL HCL 50 MG PO TABS: 50.0000 mg | ORAL_TABLET | Freq: Four times a day (QID) | ORAL | 0 refills | Status: DC | PRN

## 2021-03-10 MED ORDER — AZITHROMYCIN 250 MG PO TABS: 250.0000 mg | ORAL_TABLET | Freq: Every day | ORAL | 0 refills | Status: DC

## 2021-03-10 MED ORDER — OXYCODONE-ACETAMINOPHEN 5-325 MG PO TABS: 1.0000 | ORAL_TABLET | ORAL | 0 refills | Status: AC | PRN

## 2021-03-10 MED ORDER — PREDNISONE 20 MG PO TABS: 40.0000 mg | ORAL_TABLET | Freq: Every day | ORAL | 0 refills | Status: DC

## 2021-03-10 NOTE — ED Triage Notes (Signed)
Pt reports shortness of breath and palpitations since this morning. Pt reports cough x 1 week. Pt taking Dayquil, last dose around 7 am today. Pt is concern as she had a heart attack 1 year ago.  Denies chest pain, dizziness, vision changes, headache, nausea, vomiting.

## 2021-03-10 NOTE — ED Provider Notes (Signed)
[place]   [id number] 03/10/21 Arrival Time: 4259  ASSESSMENT & PLAN:  1. Acute cough   2. SOB (shortness of breath)   3. Wheezing    ECG: NSR. No STEMI. I have personally viewed the imaging studies ordered this visit. No signs of PNA on CXR. Smoker with wheezing. Begin: Meds ordered this encounter  Medications   azithromycin (ZITHROMAX) 250 MG tablet    Sig: Take 1 tablet (250 mg total) by mouth daily. Take first 2 tablets together, then 1 every day until finished.    Dispense:  6 tablet    Refill:  0   predniSONE (DELTASONE) 20 MG tablet    Sig: Take 2 tablets (40 mg total) by mouth daily.    Dispense:  10 tablet    Refill:  0     Follow-up Information     Jani Gravel, MD.   Specialty: Internal Medicine Why: As needed. Contact information: [address] [place] [address] [phone]                 Reviewed expectations re: course of current medical issues. Questions answered. Outlined signs and symptoms indicating need for more acute intervention. Understanding verbalized. After Visit Summary given.   SUBJECTIVE: History from: patient. [name] is a 50 y.o. female who reports: SOB and chest cong; occas palpitations; past week. No fever reported. Denies: headache. Normal PO intake without n/v/d.   OBJECTIVE:  Vitals:   03/10/21 1053  BP: (!) 158/85  Pulse: 93  Resp: (!) 21  Temp: 99.1 F (37.3 C)  TempSrc: Oral  SpO2: 96%    Recheck RR: 18 General appearance: alert; no distress Eyes: PERRLA; EOMI; conjunctiva normal HENT: [place]; AT; with nasal congestion Neck: supple  Lungs: speaks full sentences without difficulty; unlabored; mild bilat wheezing Extremities: no edema Skin: warm and dry Neurologic: normal gait Psychological: alert and cooperative; normal mood and affect   Imaging: DG Chest 2 View  Result Date: 03/10/2021 CLINICAL DATA:  Shortness of breath and palpitations today. Cough for 1  week. EXAM: CHEST - 2 VIEW COMPARISON:  Radiographs 04/06/2020 and 06/21/2019.  CT 06/21/2019. FINDINGS: The heart size and mediastinal contours are stable. There is probable mild central airway thickening, but no edema, confluent airspace opacity, pleural effusion or pneumothorax. Stable mild degenerative changes in the spine. No acute osseous findings. IMPRESSION: Probable mild central airway thickening, suggesting bronchitis. No evidence of pneumonia. Electronically Signed   By: Richardean Sale M.D.   On: 03/10/2021 11:11    Allergies  Allergen Reactions   Other    Flagyl [Metronidazole] Nausea Only   Hydrocodone Nausea And Vomiting and Other (See Comments)    Can take need to take with medication for nausea and vomitting    Past Medical History:  Diagnosis Date   Arthritis    Carpal tunnel syndrome, bilateral    Coronary artery disease    Diabetes mellitus without complication (HCC)    no meds; diet controlled   Diverticulitis    Hyperlipidemia    Phreesia 08/10/2020   Hypertension    Hypertriglyceridemia    Myocardial infarction [facility])    Phreesia 08/10/2020   PONV (postoperative nausea and vomiting)    Sciatica    Social History   Socioeconomic History   Marital status: Divorced    Spouse name: Not on file   Number of children: Not on file   Years of education: college   Highest education level: Not on file  Occupational History   Occupation: Optician, dispensing: [employer]  Tobacco Use   Smoking status: Every Day    Packs/day: 1.50    Years: 26.00    Pack years: 39.00    Types: Cigarettes    Last attempt to quit: 04/05/2020    Years since quitting: 0.9   Smokeless tobacco: Never  Vaping Use   Vaping Use: Never used  Substance and Sexual Activity   Alcohol use: No    Alcohol/week: 0.0 standard drinks   Drug use: No   Sexual activity: Yes    Birth control/protection: Surgical  Other Topics Concern   Not on file  Social History Narrative   Not on file    Social Determinants of Health   Financial Resource Strain: Not on file  Food Insecurity: Not on file  Transportation Needs: Not on file  Physical Activity: Not on file  Stress: Not on file  Social Connections: Not on file  Intimate Partner Violence: Not on file   Family History  Problem Relation Age of Onset   Diabetes type I Mother    Hyperlipidemia Mother    Diabetes type II Father    Hypertension Father    Hyperlipidemia Father    Diabetes type II Brother    Diabetes type II Brother    Cancer Other    Diabetes Other    Arthritis Other    Asthma Other    Anesthesia problems Neg Hx    Past Surgical History:  Procedure Laterality Date   ablasion of uterus     CARDIAC CATHETERIZATION     CARPAL TUNNEL RELEASE  11/10/2010   Procedure: CARPAL TUNNEL RELEASE;  Surgeon: Arther Abbott, MD;  Location: [facility];  Service: Orthopedics;  Laterality: Right;   CHOLECYSTECTOMY     APH   COLON RESECTION N/A 04/08/2015   Procedure: LAPAROSCOPIC HAND ASSISTED PARTIAL COLECTOMY  CONVERTED TO OPEN AT 0960;  Surgeon: Aviva Signs, MD;  Location: [facility];  Service: General;  Laterality: N/A;   COLONOSCOPY N/A 03/28/2014   Procedure: COLONOSCOPY;  Surgeon: Rogene Houston, MD;  Location: [facility];  Service: Endoscopy;  Laterality: N/A;  730   CORONARY STENT INTERVENTION  04/07/2020   CORONARY STENT INTERVENTION N/A 04/07/2020   Procedure: CORONARY STENT INTERVENTION;  Surgeon: Jettie Booze, MD;  Location: [place] [facility];  Service: Cardiovascular;  Laterality: N/A;   JOINT REPLACEMENT N/A    Phreesia 08/10/2020   KNEE ARTHROSCOPY WITH DRILLING/MICROFRACTURE Left 10/20/2016   Procedure: LEFT KNEE ARTHROSCOPY WITH MICROFRACTURE;  Surgeon: Carole Civil, MD;  Location: [facility];  Service: Orthopedics;  Laterality: Left;   KNEE ARTHROSCOPY WITH DRILLING/MICROFRACTURE Left 04/27/2017   Procedure: KNEE ARTHROSCOPY WITH DRILLING/MICROFRACTURE;  Surgeon: Carole Civil,  MD;  Location: [facility];  Service: Orthopedics;  Laterality: Left;   KNEE ARTHROSCOPY WITH OSTEOCHONDRAL DEFECT REPAIR Left 04/27/2017   Procedure: KNEE ARTHROSCOPY WITH OSTEOCHONDRAL DEFECT REPAIR Autograft;  Surgeon: Carole Civil, MD;  Location: [facility];  Service: Orthopedics;  Laterality: Left;   KNEE SURGERY     LEFT HEART CATH AND CORONARY ANGIOGRAPHY N/A 04/07/2020   Procedure: LEFT HEART CATH AND CORONARY ANGIOGRAPHY;  Surgeon: Jettie Booze, MD;  Location: [place] [facility];  Service: Cardiovascular;  Laterality: N/A;   PARTIAL COLECTOMY N/A 04/08/2015   Procedure: PARTIAL COLECTOMY;  Surgeon: Aviva Signs, MD;  Location: [facility];  Service: General;  Laterality: N/A;   right knee arthroscopy  SMALL INTESTINE SURGERY N/A    Phreesia 08/10/2020   TOTAL KNEE ARTHROPLASTY Left 10/17/2017   Procedure: TOTAL KNEE ARTHROPLASTY;  Surgeon: Carole Civil, MD;  Location: [facility];  Service: Orthopedics;  Laterality: Left;  DePuy    TOTAL KNEE REVISION Left 10/20/2020   Procedure: REVISION TIBIAL COMPONENT LEFT TOTAL KNEE;  Surgeon: Carole Civil, MD;  Location: [facility];  Service: Orthopedics;  Laterality: Left;   TUBAL LIGATION       Vanessa Kick, MD 03/10/21 [phone]

## 2021-03-10 NOTE — Progress Notes (Signed)
Chief Complaint  Patient presents with   Knee Problem    S/p rev left ka tibial stem 09/30/20   Encounter Diagnoses  Name Primary?   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 Loyer is 50 years old she had a revision of her tibial component left total knee with a cemented longer stem 5 months ago  We did inject the pes bursa which gave her some relief.  She has been less active and has noticed less pain in the knee seems to hurt her more when she walks for a long time  She would like to decrease her Percocet from 7.5 mg to 5 mg  She would like a refill on her tramadol  X-rays were not obtained of the knee and its shows that she has stable stem good femoral offset balanced patella no evidence of loosening  No evidence this tibial tendon impingement on the tibia  Assessment and plan  Status post tibial revision of a left total knee 5 months out Pain is less with less activity Decrease and taper opioids Follow-up in 6 weeks  Meds ordered this encounter  Medications   oxyCODONE-acetaminophen (PERCOCET/ROXICET) 5-325 MG tablet    Sig: Take 1 tablet by mouth every 4 (four) hours as needed for up to 5 days for severe pain.    Dispense:  30 tablet    Refill:  0   traMADol (ULTRAM) 50 MG tablet    Sig: Take 1 tablet (50 mg total) by mouth every 6 (six) hours as needed.    Dispense:  28 tablet    Refill:  0

## 2021-03-10 NOTE — Discharge Instructions (Addendum)
You have been seen at the [facility] today for shortness of breath. Your evaluation today was not suggestive of any condition requiring emergent medical intervention at this time. Your chest x-ray and  ECG (heart tracing) did not show any worrisome changes. However, some medical problems make take more time to appear. Therefore, it's very important that you pay attention to any new symptoms or worsening of your current condition.  Please proceed directly to the Emergency Department immediately should you feel worse in any way or have any of the following symptoms: increasing or different shortness of breath, chest pain that spreads to your arm, neck, jaw, back or abdomen, worsening shortness of breath, or nausea/vomiting.

## 2021-03-14 ENCOUNTER — Telehealth: Payer: 59 | Admitting: Family

## 2021-03-14 NOTE — Progress Notes (Signed)
Attempted to connect with patient. Called patient and she is having difficulties connecting. She is downloading her app and will reschedule her appointment later today.   Evelina Dun, FNP

## 2021-03-15 ENCOUNTER — Ambulatory Visit (INDEPENDENT_AMBULATORY_CARE_PROVIDER_SITE_OTHER): Payer: 59

## 2021-03-15 ENCOUNTER — Encounter: Payer: Self-pay | Admitting: Emergency Medicine

## 2021-03-15 ENCOUNTER — Other Ambulatory Visit: Payer: Self-pay

## 2021-03-15 ENCOUNTER — Ambulatory Visit
Admission: EM | Admit: 2021-03-15 | Discharge: 2021-03-15 | Disposition: A | Discharge status: Home / Self Care | Payer: 59 | Attending: Internal Medicine | Admitting: Internal Medicine

## 2021-03-15 DIAGNOSIS — R0602 Shortness of breath: Secondary | ICD-10-CM | POA: Diagnosis not present

## 2021-03-15 DIAGNOSIS — R059 Cough, unspecified: Secondary | ICD-10-CM

## 2021-03-15 DIAGNOSIS — J209 Acute bronchitis, unspecified: Secondary | ICD-10-CM

## 2021-03-15 MED ORDER — METHYLPREDNISOLONE SODIUM SUCC 125 MG IJ SOLR: 60.0000 mg | Freq: Once | INTRAMUSCULAR | Status: DC

## 2021-03-15 MED ORDER — PREDNISONE 10 MG (21) PO TBPK: ORAL_TABLET | Freq: Every day | ORAL | 0 refills | Status: DC

## 2021-03-15 MED ORDER — ALBUTEROL SULFATE HFA 108 (90 BASE) MCG/ACT IN AERS: 1.0000 | INHALATION_SPRAY | Freq: Four times a day (QID) | RESPIRATORY_TRACT | 0 refills | Status: DC | PRN

## 2021-03-15 NOTE — Discharge Instructions (Addendum)
Please take medications as prescribed Please avoid smoking Your chest x-ray is negative for pneumonia If symptoms worsen please return to urgent care to be reevaluated.

## 2021-03-15 NOTE — ED Triage Notes (Signed)
Was here 11/16 for same, has completed z pack and prednisone, has not improved. Reports cough and SHOB

## 2021-03-15 NOTE — ED Provider Notes (Signed)
[facility]    CSN: [id number] Arrival date & time: 03/15/21  1006      History   Chief Complaint Chief Complaint  Patient presents with   Cough    HPI [name] is a 50 y.o. female comes to the urgent care with persistent cough, shortness of breath and wheezing.  Patient was seen here 5 days ago for similar symptoms.  She completed a course of Z-Pak and prednisone with no significant improvement in her symptoms.  Chest x-ray showed some bronchitic changes during that visit.  No fever or chills.  She has chest tightness and wheezing.  She denies any inhaler use.  Patient has a 30-pack-year history of smoking.  No chest pain or chest pressure.  Cough is minimally productive.  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[place])    Chronic pain in left foot 08/31/2020   Coronary artery disease    Diabetes mellitus without complication ([place])    Hypertension    Hypertriglyceridemia    Non-ST elevation (NSTEMI) myocardial infarction ([place])    Chest pain 04/06/2020   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    Chondral defect of condyle of right femur    S/P left knee arthroscopy 04/27/17 03/28/2017   S/P total knee replacement, 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    Abdominal pain 02/17/2015   Nausea without vomiting 02/17/2015   Diverticulitis of intestine with  abscess 01/08/2015   Acute diverticulitis 01/08/2015   Nausea 12/31/2014   Diverticulitis 12/31/2014   Hyperglycemia 12/31/2014   Medication reaction 12/31/2014   Diverticulitis of large intestine without perforation or abscess without bleeding    Thrush, oral    Fatty liver 03/11/2014   Family hx of colon cancer requiring screening colonoscopy 03/11/2014   Rt flank pain 03/11/2014   Ulnar nerve compression 06/14/2011   Compartment syndrome, nontraumatic 06/08/2011   LOW BACK PAIN 11/07/2007   SCIATICA 11/07/2007   LUMBAR SPASM 11/07/2007    Past Surgical History:  Procedure Laterality Date   ablasion of uterus     CARDIAC CATHETERIZATION     CARPAL TUNNEL RELEASE  11/10/2010   Procedure: CARPAL TUNNEL RELEASE;  Surgeon: Arther Abbott, MD;  [facility];  Service: Orthopedics;  Laterality: Right;   CHOLECYSTECTOMY     APH   COLON RESECTION N/A 04/08/2015   Procedure: LAPAROSCOPIC HAND ASSISTED PARTIAL COLECTOMY  CONVERTED TO OPEN AT 3710;  Surgeon: Aviva Signs, MD;  Location: [facility];  Service: General;  Laterality: N/A;   COLONOSCOPY N/A 03/28/2014   Procedure: COLONOSCOPY;  Surgeon: Rogene Houston, MD;  [facility];  Service: Endoscopy;  Laterality: N/A;  730   CORONARY STENT INTERVENTION  04/07/2020   CORONARY STENT INTERVENTION N/A 04/07/2020   Procedure: CORONARY STENT INTERVENTION;  Surgeon: Jettie Booze, MD;  Location: [place] [facility];  Service: Cardiovascular;  Laterality: N/A;   JOINT REPLACEMENT N/A    Phreesia 08/10/2020   KNEE ARTHROSCOPY WITH DRILLING/MICROFRACTURE Left 10/20/2016   Procedure: LEFT KNEE ARTHROSCOPY WITH MICROFRACTURE;  Surgeon: Carole Civil, MD;  Location: [facility];  Service: Orthopedics;  Laterality: Left;   KNEE ARTHROSCOPY WITH DRILLING/MICROFRACTURE Left 04/27/2017   Procedure: KNEE ARTHROSCOPY WITH DRILLING/MICROFRACTURE;  Surgeon: Carole Civil, MD;  Location: [facility];  Service: Orthopedics;   Laterality: Left;   KNEE ARTHROSCOPY WITH OSTEOCHONDRAL DEFECT REPAIR Left 04/27/2017   Procedure: KNEE ARTHROSCOPY WITH OSTEOCHONDRAL DEFECT REPAIR Autograft;  Surgeon: Carole Civil, MD;  Location: [facility];  Service: Orthopedics;  Laterality: Left;   KNEE SURGERY     LEFT HEART CATH AND CORONARY ANGIOGRAPHY N/A 04/07/2020   Procedure: LEFT HEART CATH AND CORONARY ANGIOGRAPHY;  Surgeon: Jettie Booze, MD;  Location: [place] [facility];  Service: Cardiovascular;  Laterality: N/A;   PARTIAL COLECTOMY N/A 04/08/2015   Procedure: PARTIAL COLECTOMY;  Surgeon: Aviva Signs, MD;  Location: [facility];  Service: General;  Laterality: N/A;   right knee arthroscopy     SMALL INTESTINE SURGERY N/A    Phreesia 08/10/2020   TOTAL KNEE ARTHROPLASTY Left 10/17/2017   Procedure: TOTAL KNEE ARTHROPLASTY;  Surgeon: Carole Civil, MD;  Location: [facility];  Service: Orthopedics;  Laterality: Left;  DePuy    TOTAL KNEE REVISION Left 10/20/2020   Procedure: REVISION TIBIAL COMPONENT LEFT TOTAL KNEE;  Surgeon: Carole Civil, MD;  Location: [facility];  Service: Orthopedics;  Laterality: Left;   TUBAL LIGATION      OB History   No obstetric history on file.      Home Medications    Prior to Admission medications   Medication Sig Start Date End Date Taking? Authorizing Provider  albuterol (VENTOLIN HFA) 108 (90 Base) MCG/ACT inhaler Inhale 1-2 puffs into the lungs every 6 (six) hours as needed for wheezing or shortness of breath. 03/15/21  Yes Tzirel Leonor, Myrene Galas, MD  predniSONE (STERAPRED UNI-PAK 21 TAB) 10 MG (21) TBPK tablet Take by mouth daily. Take 6 tabs by mouth daily  for 2 days, then 5 tabs for 2 days, then 4 tabs for 2 days, then 3 tabs for 2 days, 2 tabs for 2 days, then 1 tab by mouth daily for 2 days 03/15/21  Yes Kyliah Deanda, Myrene Galas, MD  aspirin EC 81 MG tablet Take 81 mg by mouth daily. Swallow whole.    [provider]  atorvastatin (LIPITOR) 40 MG tablet Take 1 tablet (40 mg  total) by mouth daily. 02/10/21   Verta Ellen., NP  carvedilol (COREG) 12.5 MG tablet Take 1 tablet (12.5 mg total) by mouth 2 (two) times daily. 05/04/20 05/04/21  Verta Ellen., NP  cholecalciferol (VITAMIN D) 25 MCG (1000 UNIT) tablet Take 1,000 Units by mouth daily.    [provider]  clopidogrel (PLAVIX) 75 MG tablet Take 1 tablet (75 mg total) by mouth daily. Take 4 tablets on day one then 1 tablet daily thereafter. Patient taking differently: Take 75 mg by mouth daily. 06/30/20   Arnoldo Lenis, MD  docusate sodium (COLACE) 100 MG capsule Take 1 capsule (100 mg total) by mouth 2 (two) times daily. 10/21/20   Carole Civil, MD  glipiZIDE (GLUCOTROL XL) 5 MG 24 hr tablet Take 1 tablet (5 mg total) by mouth daily with breakfast. 08/05/20  Brita Romp, NP  glucose blood (ONETOUCH VERIO) test strip Use as instructed to monitor blood glucose once daily. 05/07/20   Brita Romp, NP  Javier Docker Oil (OMEGA-3) 500 MG CAPS Take 1,000 mg by mouth daily.    [provider]  Lancets (ONETOUCH DELICA PLUS DTOIZT24P) [place] by Does not apply route.    [provider]  losartan (COZAAR) 50 MG tablet Take 1 tablet (50 mg total) by mouth daily. 02/08/21   Arnoldo Lenis, MD  metFORMIN (GLUCOPHAGE-XR) 500 MG 24 hr tablet TAKE 2 TABLETS BY MOUTH ONCE DAILY WITH BREAKFAST 03/01/21   Brita Romp, NP  methocarbamol (ROBAXIN) 500 MG tablet TAKE 1 TABLET BY MOUTH EVERY 6 HOURS AS NEEDED FOR MUSCLE SPASM 02/22/21   Carole Civil, MD  nitroGLYCERIN (NITROSTAT) 0.4 MG SL tablet PLACE 1 TABLET (0.4 MG TOTAL) UNDER THE TONGUE EVERY 5 (FIVE) MINUTES AS NEEDED FOR CHEST PAIN. Patient taking differently: Place 0.4 mg under the tongue every 5 (five) minutes as needed for chest pain. 04/08/20 04/08/21  Leanor Kail, PA  oxyCODONE-acetaminophen (PERCOCET/ROXICET) 5-325 MG tablet Take 1 tablet by mouth every 4 (four) hours as needed for up to 5 days for  severe pain. 03/10/21 03/15/21  Carole Civil, MD  promethazine (PHENERGAN) 12.5 MG tablet Take 1 tablet (12.5 mg total) by mouth every 4 (four) hours as needed for nausea or vomiting. 11/04/20   Carole Civil, MD  traMADol (ULTRAM) 50 MG tablet Take 1 tablet (50 mg total) by mouth every 6 (six) hours as needed. 03/10/21   Carole Civil, MD  pantoprazole (PROTONIX) 40 MG tablet Take 1 tablet (40 mg total) by mouth daily. Further refills by PCP Patient not taking: Reported on 05/18/2020 04/08/20 05/18/20  Leanor Kail, PA    Family History Family History  Problem Relation Age of Onset   Diabetes type I Mother    Hyperlipidemia Mother    Diabetes type II Father    Hypertension Father    Hyperlipidemia Father    Diabetes type II Brother    Diabetes type II Brother    Cancer Other    Diabetes Other    Arthritis Other    Asthma Other    Anesthesia problems Neg Hx     Social History Social History   Tobacco Use   Smoking status: Every Day    Packs/day: 1.50    Years: 26.00    Pack years: 39.00    Types: Cigarettes    Last attempt to quit: 04/05/2020    Years since quitting: 0.9   Smokeless tobacco: Never  Vaping Use   Vaping Use: Never used  Substance Use Topics   Alcohol use: No    Alcohol/week: 0.0 standard drinks   Drug use: No     Allergies   Other, Flagyl [metronidazole], and Hydrocodone   Review of Systems Review of Systems  Constitutional: Negative.   HENT:  Positive for congestion and postnasal drip.   Respiratory:  Positive for cough, chest tightness, shortness of breath and wheezing. Negative for stridor.   Cardiovascular:  Positive for chest pain. Negative for palpitations.  Gastrointestinal: Negative.  Negative for abdominal pain, constipation and diarrhea.  Neurological: Negative.     Physical Exam Triage Vital Signs ED Triage Vitals  Enc Vitals Group     BP 03/15/21 1306 (!) 158/93     Pulse Rate 03/15/21 1306 93     Resp  03/15/21 1306 (!) 22  Temp 03/15/21 1306 98.9 F (37.2 C)     Temp Source 03/15/21 1306 Oral     SpO2 03/15/21 1306 96 %     Weight --      Height --      Head Circumference --      Peak Flow --      Pain Score 03/15/21 1305 4     Pain Loc --      Pain Edu? --      Excl. in [place]? --    No data found.  Updated Vital Signs BP (!) 158/93   Pulse 93   Temp 98.9 F (37.2 C) (Oral)   Resp (!) 22   SpO2 96%   Visual Acuity Right Eye Distance:   Left Eye Distance:   Bilateral Distance:    Right Eye Near:   Left Eye Near:    Bilateral Near:     Physical Exam Vitals and nursing note reviewed.  Constitutional:      General: She is not in acute distress.    Appearance: She is ill-appearing.  Cardiovascular:     Rate and Rhythm: Normal rate and regular rhythm.  Pulmonary:     Effort: Respiratory distress present.     Comments: Decreased air entry in the lung bases bilaterally.  Expiratory wheezing bilaterally.  No rhonchi or rails noted. Abdominal:     General: Bowel sounds are normal.     Palpations: Abdomen is soft.  Skin:    General: Skin is warm.  Neurological:     Mental Status: She is alert.     UC Treatments / Results  Labs (all labs ordered are listed, but only abnormal results are displayed) Labs Reviewed - No data to display  EKG   Radiology DG Chest 2 View  Result Date: 03/15/2021 CLINICAL DATA:  Shortness of breath with cough. EXAM: CHEST - 2 VIEW COMPARISON:  03/10/2021 FINDINGS: The cardiomediastinal silhouette is unchanged with normal heart size. Peribronchial thickening is unchanged. No confluent airspace opacity, overt pulmonary edema, pleural effusion, or pneumothorax is identified. No acute osseous abnormality is seen. IMPRESSION: Stable bronchitic changes. Electronically Signed   By: Logan Bores M.D.   On: 03/15/2021 14:31    Procedures Procedures (including critical care time)  Medications Ordered in UC Medications  methylPREDNISolone  sodium succinate (SOLU-MEDROL) 125 mg/2 mL injection 60 mg (has no administration in time range)    Initial Impression / Assessment and Plan / UC Course  I have reviewed the triage vital signs and the nursing notes.  Pertinent labs & imaging results that were available during my care of the patient were reviewed by me and considered in my medical decision making (see chart for details).     1.  Acute bronchitis with bronchospasm: Chest x-ray shows bronchitic changes.  No signs of pneumonia Prednisone taper Albuterol inhaler Solu-Medrol 60 mg IM x1 dose No indication for antibiotics at this time If symptoms worsen please return to urgent care to be reevaluated Smoke cessation advice given.  Patient is in the precontemplative state of smoke cessation. Final Clinical Impressions(s) / UC Diagnoses   Final diagnoses:  Acute bronchitis with bronchospasm     Discharge Instructions      Please take medications as prescribed Please avoid smoking Your chest x-ray is negative for pneumonia If symptoms worsen please return to urgent care to be reevaluated.    ED Prescriptions     Medication Sig Dispense Auth. Provider   predniSONE (STERAPRED UNI-PAK 21  TAB) 10 MG (21) TBPK tablet Take by mouth daily. Take 6 tabs by mouth daily  for 2 days, then 5 tabs for 2 days, then 4 tabs for 2 days, then 3 tabs for 2 days, 2 tabs for 2 days, then 1 tab by mouth daily for 2 days 42 tablet Sreeja Spies, Myrene Galas, MD   albuterol (VENTOLIN HFA) 108 (90 Base) MCG/ACT inhaler Inhale 1-2 puffs into the lungs every 6 (six) hours as needed for wheezing or shortness of breath. 18 g Comer Devins, Myrene Galas, MD      PDMP not reviewed this encounter.   Chase Picket, MD 03/15/21 [id number]

## 2021-04-06 ENCOUNTER — Encounter: Payer: Self-pay | Admitting: Orthopedic Surgery

## 2021-04-06 DIAGNOSIS — Z96652 Presence of left artificial knee joint: Secondary | ICD-10-CM

## 2021-04-06 MED ORDER — TRAMADOL HCL 50 MG PO TABS: 50.0000 mg | ORAL_TABLET | Freq: Four times a day (QID) | ORAL | 0 refills | Status: DC | PRN

## 2021-04-06 MED ORDER — OXYCODONE-ACETAMINOPHEN 5-325 MG PO TABS: 1.0000 | ORAL_TABLET | ORAL | 0 refills | Status: DC | PRN

## 2021-04-07 ENCOUNTER — Other Ambulatory Visit: Payer: Self-pay | Admitting: Orthopedic Surgery

## 2021-04-07 MED ORDER — OXYCODONE HCL 5 MG PO TABS: 5.0000 mg | ORAL_TABLET | ORAL | 0 refills | Status: DC | PRN

## 2021-04-07 NOTE — Progress Notes (Signed)
Meds ordered this encounter  Medications   oxyCODONE (OXY IR/ROXICODONE) 5 MG immediate release tablet    Sig: Take 1 tablet (5 mg total) by mouth every 4 (four) hours as needed for up to 5 days for severe pain.    Dispense:  30 tablet    Refill:  0    

## 2021-04-12 ENCOUNTER — Ambulatory Visit: Payer: 59 | Admitting: Nurse Practitioner

## 2021-04-21 ENCOUNTER — Other Ambulatory Visit: Payer: Self-pay

## 2021-04-21 ENCOUNTER — Encounter: Payer: Self-pay | Admitting: Orthopedic Surgery

## 2021-04-21 ENCOUNTER — Ambulatory Visit (INDEPENDENT_AMBULATORY_CARE_PROVIDER_SITE_OTHER): Payer: 59 | Admitting: Orthopedic Surgery

## 2021-04-21 DIAGNOSIS — Z96652 Presence of left artificial knee joint: Secondary | ICD-10-CM | POA: Diagnosis not present

## 2021-04-21 DIAGNOSIS — Z96659 Presence of unspecified artificial knee joint: Secondary | ICD-10-CM

## 2021-04-21 DIAGNOSIS — T84038D Mechanical loosening of other internal prosthetic joint, subsequent encounter: Secondary | ICD-10-CM | POA: Diagnosis not present

## 2021-04-21 MED ORDER — OXYCODONE HCL 5 MG PO TABS: 5.0000 mg | ORAL_TABLET | ORAL | 0 refills | Status: AC | PRN

## 2021-04-21 NOTE — Progress Notes (Signed)
Chief Complaint  Patient presents with   Knee Pain    Left / painful s/p revision TKR 10/20/20    Encounter Diagnoses  Name Primary?   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  She takes 5 to 10 mg of oxycodone per day she does not feel that the tramadol helps  It seems most of her pain is activity related the left she does the less pain she has  I suspect despite the cement in the tibia that may be the stem is finding its home  Recommend continue with current regiment but she can stop the tramadol  Meds ordered this encounter  Medications   oxyCODONE (OXY IR/ROXICODONE) 5 MG immediate release tablet    Sig: Take 1 tablet (5 mg total) by mouth every 4 (four) hours as needed for up to 5 days for severe pain.    Dispense:  30 tablet    Refill:  0

## 2021-04-28 NOTE — Patient Instructions (Signed)

## 2021-04-29 ENCOUNTER — Encounter: Payer: Self-pay | Admitting: Nurse Practitioner

## 2021-04-29 ENCOUNTER — Other Ambulatory Visit: Payer: Self-pay

## 2021-04-29 ENCOUNTER — Ambulatory Visit (INDEPENDENT_AMBULATORY_CARE_PROVIDER_SITE_OTHER): Payer: 59 | Admitting: Nurse Practitioner

## 2021-04-29 VITALS — BP 124/76 | HR 78 | Ht 65.0 in | Wt 225.8 lb

## 2021-04-29 DIAGNOSIS — I10 Essential (primary) hypertension: Secondary | ICD-10-CM | POA: Diagnosis not present

## 2021-04-29 DIAGNOSIS — E1165 Type 2 diabetes mellitus with hyperglycemia: Secondary | ICD-10-CM

## 2021-04-29 DIAGNOSIS — E782 Mixed hyperlipidemia: Secondary | ICD-10-CM | POA: Diagnosis not present

## 2021-04-29 LAB — POCT GLYCOSYLATED HEMOGLOBIN (HGB A1C): HbA1c, POC (controlled diabetic range): 8.1 % — AB (ref 0.0–7.0)

## 2021-04-29 NOTE — Progress Notes (Signed)
Endocrinology Follow Up Note       04/29/2021, 4:23 PM   Subjective:    Patient ID: [name], female    DOB: 1970-08-09.  [name] is being seen in follow up after being seen in consultation for management of currently uncontrolled symptomatic diabetes requested by  Jani Gravel, MD.   Past Medical History:  Diagnosis Date   Arthritis    Carpal tunnel syndrome, bilateral    Coronary artery disease    Diabetes mellitus without complication ([place])    no meds; diet controlled   Diverticulitis    Hyperlipidemia    Phreesia 08/10/2020   Hypertension    Hypertriglyceridemia    Myocardial infarction ([place])    Phreesia 08/10/2020   PONV (postoperative nausea and vomiting)    Sciatica     Past Surgical History:  Procedure Laterality Date   ablasion of uterus     CARDIAC CATHETERIZATION     CARPAL TUNNEL RELEASE  11/10/2010   Procedure: CARPAL TUNNEL RELEASE;  Surgeon: Arther Abbott, MD;  Location: [facility];  Service: Orthopedics;  Laterality: Right;   CHOLECYSTECTOMY     [facility]   COLON RESECTION N/A 04/08/2015   Procedure: LAPAROSCOPIC HAND ASSISTED PARTIAL COLECTOMY  CONVERTED TO OPEN AT 8366;  Surgeon: Aviva Signs, MD;  Location: [facility];  Service: General;  Laterality: N/A;   COLONOSCOPY N/A 03/28/2014   Procedure: COLONOSCOPY;  Surgeon: Rogene Houston, MD;  Location: [facility];  Service: Endoscopy;  Laterality: N/A;  730   CORONARY STENT INTERVENTION  04/07/2020   CORONARY STENT INTERVENTION N/A 04/07/2020   Procedure: CORONARY STENT INTERVENTION;  Surgeon: Jettie Booze, MD;  Location: [facility];  Service: Cardiovascular;  Laterality: N/A;   JOINT REPLACEMENT N/A    Phreesia 08/10/2020   KNEE ARTHROSCOPY WITH DRILLING/MICROFRACTURE Left 10/20/2016   Procedure: LEFT KNEE ARTHROSCOPY WITH MICROFRACTURE;  Surgeon: Carole Civil, MD;  Location: [facility];  Service: Orthopedics;   Laterality: Left;   KNEE ARTHROSCOPY WITH DRILLING/MICROFRACTURE Left 04/27/2017   Procedure: KNEE ARTHROSCOPY WITH DRILLING/MICROFRACTURE;  Surgeon: Carole Civil, MD;  Location: [facility];  Service: Orthopedics;  Laterality: Left;   KNEE ARTHROSCOPY WITH OSTEOCHONDRAL DEFECT REPAIR Left 04/27/2017   Procedure: KNEE ARTHROSCOPY WITH OSTEOCHONDRAL DEFECT REPAIR Autograft;  Surgeon: Carole Civil, MD;  Location: [facility];  Service: Orthopedics;  Laterality: Left;   KNEE SURGERY     LEFT HEART CATH AND CORONARY ANGIOGRAPHY N/A 04/07/2020   Procedure: LEFT HEART CATH AND CORONARY ANGIOGRAPHY;  Surgeon: Jettie Booze, MD;  Location: [facility];  Service: Cardiovascular;  Laterality: N/A;   PARTIAL COLECTOMY N/A 04/08/2015   Procedure: PARTIAL COLECTOMY;  Surgeon: Aviva Signs, MD;  Location: [facility];  Service: General;  Laterality: N/A;   right knee arthroscopy     SMALL INTESTINE SURGERY N/A    Phreesia 08/10/2020   TOTAL KNEE ARTHROPLASTY Left 10/17/2017   Procedure: TOTAL KNEE ARTHROPLASTY;  Surgeon: Carole Civil, MD;  Location: [facility];  Service: Orthopedics;  Laterality: Left;  [place]    TOTAL KNEE REVISION Left 10/20/2020   Procedure: REVISION TIBIAL COMPONENT LEFT TOTAL KNEE;  Surgeon: Aline Brochure,  Tim Lair, MD;  Location: [facility];  Service: Orthopedics;  Laterality: Left;   TUBAL LIGATION      Social History   Socioeconomic History   Marital status: Divorced    Spouse name: Not on file   Number of children: Not on file   Years of education: college   Highest education level: Not on file  Occupational History   Occupation: nurse    Employer: [employer]  Tobacco Use   Smoking status: Every Day    Packs/day: 1.50    Years: 26.00    Pack years: 39.00    Types: Cigarettes    Last attempt to quit: 04/05/2020    Years since quitting: 1.0   Smokeless tobacco: Never  Vaping Use   Vaping Use: Never used  Substance and Sexual Activity   Alcohol use: No     Alcohol/week: 0.0 standard drinks   Drug use: No   Sexual activity: Yes    Birth control/protection: Surgical  Other Topics Concern   Not on file  Social History Narrative   Not on file   Social Determinants of Health   Financial Resource Strain: Not on file  Food Insecurity: Not on file  Transportation Needs: Not on file  Physical Activity: Not on file  Stress: Not on file  Social Connections: Not on file    Family History  Problem Relation Age of Onset   Diabetes type I Mother    Hyperlipidemia Mother    Diabetes type II Father    Hypertension Father    Hyperlipidemia Father    Diabetes type II Brother    Diabetes type II Brother    Cancer Other    Diabetes Other    Arthritis Other    Asthma Other    Anesthesia problems Neg Hx     Outpatient Encounter Medications as of 04/29/2021  Medication Sig   aspirin EC 81 MG tablet Take 81 mg by mouth daily. Swallow whole.   atorvastatin (LIPITOR) 40 MG tablet Take 1 tablet (40 mg total) by mouth daily.   carvedilol (COREG) 12.5 MG tablet Take 1 tablet (12.5 mg total) by mouth 2 (two) times daily.   cholecalciferol (VITAMIN D) 25 MCG (1000 UNIT) tablet Take 1,000 Units by mouth daily.   clopidogrel (PLAVIX) 75 MG tablet Take 1 tablet (75 mg total) by mouth daily. Take 4 tablets on day one then 1 tablet daily thereafter. (Patient taking differently: Take 75 mg by mouth daily.)   docusate sodium (COLACE) 100 MG capsule Take 1 capsule (100 mg total) by mouth 2 (two) times daily.   glipiZIDE (GLUCOTROL XL) 5 MG 24 hr tablet Take 1 tablet (5 mg total) by mouth daily with breakfast.   glucose blood (ONETOUCH VERIO) test strip Use as instructed to monitor blood glucose once daily.   Krill Oil (OMEGA-3) 500 MG CAPS Take 1,000 mg by mouth daily.   Lancets (ONETOUCH DELICA PLUS [id number]) MISC by Does not apply route.   losartan (COZAAR) 50 MG tablet Take 1 tablet (50 mg total) by mouth daily.   metFORMIN (GLUCOPHAGE-XR) 500 MG 24 hr  tablet TAKE 2 TABLETS BY MOUTH ONCE DAILY WITH BREAKFAST   methocarbamol (ROBAXIN) 500 MG tablet TAKE 1 TABLET BY MOUTH EVERY 6 HOURS AS NEEDED FOR MUSCLE SPASM   OxyCODONE HCl, Abuse Deter, (OXAYDO) 5 MG TABA Take 1 tablet by mouth every 4 (four) hours as needed.   promethazine (PHENERGAN) 12.5 MG tablet Take 1 tablet (12.5 mg total) by  mouth every 4 (four) hours as needed for nausea or vomiting.   albuterol (VENTOLIN HFA) 108 (90 Base) MCG/ACT inhaler Inhale 1-2 puffs into the lungs every 6 (six) hours as needed for wheezing or shortness of breath. (Patient not taking: Reported on 04/21/2021)   nitroGLYCERIN (NITROSTAT) 0.4 MG SL tablet PLACE 1 TABLET (0.4 MG TOTAL) UNDER THE TONGUE EVERY 5 (FIVE) MINUTES AS NEEDED FOR CHEST PAIN. (Patient taking differently: Place 0.4 mg under the tongue every 5 (five) minutes as needed for chest pain.)   traMADol (ULTRAM) 50 MG tablet Take 1 tablet (50 mg total) by mouth every 6 (six) hours as needed. (Patient not taking: Reported on 04/29/2021)   [DISCONTINUED] pantoprazole (PROTONIX) 40 MG tablet Take 1 tablet (40 mg total) by mouth daily. Further refills by PCP (Patient not taking: Reported on 05/18/2020)   No facility-administered encounter medications on file as of 04/29/2021.    ALLERGIES: Allergies  Allergen Reactions   Other    Flagyl [Metronidazole] Nausea Only   Hydrocodone Nausea And Vomiting and Other (See Comments)    Can take need to take with medication for nausea and vomitting    VACCINATION STATUS: Immunization History  Administered Date(s) Administered   Moderna Sars-Covid-2 Vaccination 06/25/2019   Diabetes She presents for her follow-up diabetic visit. She has type 2 diabetes mellitus. Onset time: She was diagnosed at approx age of 83. Her disease course has been worsening. There are no hypoglycemic associated symptoms. Associated symptoms include fatigue. Pertinent negatives for diabetes include no blurred vision, no polydipsia, no  polyphagia, no polyuria and no visual change. There are no hypoglycemic complications. Symptoms are stable. Diabetic complications include heart disease. (Had MI recently) Risk factors for coronary artery disease include diabetes mellitus, dyslipidemia, hypertension, obesity, tobacco exposure, sedentary lifestyle and family history. Current diabetic treatment includes oral agent (dual therapy). She is compliant with treatment most of the time. Her weight is fluctuating minimally. She is following a generally unhealthy diet. Meal planning includes avoidance of concentrated sweets. She has not had a previous visit with a dietitian. She rarely participates in exercise. (She presents today with no meter or logs to review.  She says she has not been monitoring her glucose as she should be.  Her POCT A1c today is 8.1%, increasing drastically from last visit of 6.6%.  She says she has not been active due to her bilateral knee pain and has eaten very poorly.  She is asking to see the dietician again.  ) An ACE inhibitor/angiotensin II receptor blocker is being taken. She does not see a podiatrist.Eye exam is current.  Hyperlipidemia This is a chronic problem. The current episode started more than 1 year ago. The problem is uncontrolled. Recent lipid tests were reviewed and are variable. Exacerbating diseases include diabetes and obesity. Factors aggravating her hyperlipidemia include beta blockers. Current antihyperlipidemic treatment includes statins. The current treatment provides moderate improvement of lipids. Compliance problems include adherence to diet and adherence to exercise.  Risk factors for coronary artery disease include diabetes mellitus, dyslipidemia, obesity, hypertension, a sedentary lifestyle and family history.  Hypertension This is a chronic problem. The current episode started more than 1 year ago. The problem has been resolved since onset. The problem is controlled. Pertinent negatives include no  blurred vision. There are no associated agents to hypertension. Risk factors for coronary artery disease include diabetes mellitus, dyslipidemia, obesity, sedentary lifestyle, smoking/tobacco exposure and family history. Past treatments include beta blockers and angiotensin blockers. The current treatment provides  mild improvement. There are no compliance problems.  Hypertensive end-organ damage includes CAD/MI.    Review of systems  Constitutional: + Minimally fluctuating body weight,  current Body mass index is 37.58 kg/m. , no fatigue, no subjective hyperthermia, no subjective hypothermia Eyes: no blurry vision, no xerophthalmia ENT: no sore throat, no nodules palpated in throat, no dysphagia/odynophagia, no hoarseness Cardiovascular: no chest pain, no shortness of breath, no palpitations, no leg swelling Respiratory: no cough, no shortness of breath Gastrointestinal: no nausea/vomiting/diarrhea Musculoskeletal: + bilateral knee pain- hx of TKR Skin: no rashes, no hyperemia Neurological: no tremors, no numbness, no tingling, no dizziness Psychiatric: no depression, no anxiety   Objective:     BP 124/76    Pulse 78    Ht 5\' 5"  (1.651 m)    Wt 225 lb 12.8 oz (102.4 kg)    SpO2 98%    BMI 37.58 kg/m   Wt Readings from Last 3 Encounters:  04/29/21 225 lb 12.8 oz (102.4 kg)  02/10/21 225 lb (102.1 kg)  12/08/20 221 lb (100.2 kg)    BP Readings from Last 3 Encounters:  04/29/21 124/76  03/15/21 (!) 158/93  03/10/21 (!) 158/85     Physical Exam- Limited  Constitutional:  Body mass index is 37.58 kg/m. , not in acute distress, normal state of mind Eyes:  EOMI, no exophthalmos Neck: Supple Cardiovascular: RRR, no murmurs, rubs, or gallops, no edema Respiratory: Adequate breathing efforts, no crackles, rales, rhonchi, or wheezing Musculoskeletal: no gross deformities, strength intact in all four extremities, no gross restriction of joint movements Skin:  no rashes, no  hyperemia Neurological: no tremor with outstretched hands    CMP ( most recent) CMP     Component Value Date/Time   NA 136 12/02/2020 1204   K 4.9 12/02/2020 1204   CL 101 12/02/2020 1204   CO2 22 12/02/2020 1204   GLUCOSE 188 (H) 12/02/2020 1204   GLUCOSE 197 (H) 10/21/2020 0619   BUN 22 12/02/2020 1204   CREATININE 0.61 12/02/2020 1204   CALCIUM 9.4 12/02/2020 1204   PROT 6.9 12/02/2020 1204   ALBUMIN 4.6 12/02/2020 1204   AST 24 12/02/2020 1204   ALT 56 (H) 12/02/2020 1204   ALKPHOS 96 12/02/2020 1204   BILITOT 0.4 12/02/2020 1204   GFRNONAA >60 10/21/2020 0619   GFRAA >60 06/21/2019 2103     Diabetic Labs (most recent): Lab Results  Component Value Date   HGBA1C 6.6 (H) 10/16/2020   HGBA1C 6.3 08/05/2020   HGBA1C 7.0 (H) 04/06/2020     Lipid Panel ( most recent) Lipid Panel     Component Value Date/Time   CHOL 89 05/19/2020 0903   TRIG 125 05/19/2020 0903   HDL 23 (L) 05/19/2020 0903   CHOLHDL 3.9 05/19/2020 0903   VLDL 25 05/19/2020 0903   LDLCALC 41 05/19/2020 0903      Lab Results  Component Value Date   TSH 0.905 05/19/2020   TSH 1.321 04/06/2020   TSH 1.152 06/21/2019   FREET4 0.92 05/19/2020      Assessment & Plan:   1) Type 2 diabetes mellitus with hyperglycemia, without long-term current use of insulin ([place])  She presents today with no meter or logs to review.  She says she has not been monitoring her glucose as she should be.  Her POCT A1c today is 8.1%, increasing drastically from last visit of 6.6%.  She says she has not been active due to her bilateral knee pain and has eaten  very poorly.  She is asking to see the dietician again.    - [name] has currently uncontrolled symptomatic type 2 DM since 51 years of age.   -Recent labs reviewed.  - I had a long discussion with her about the progressive nature of diabetes and the pathology behind its complications. -her diabetes is complicated by CAD (recentMI) and she remains at a  high risk for more acute and chronic complications which include CAD, CVA, CKD, retinopathy, and neuropathy. These are all discussed in detail with her.  - Nutritional counseling repeated at each appointment due to patients tendency to fall back in to old habits.  - The patient admits there is a room for improvement in their diet and drink choices. -  Suggestion is made for the patient to avoid simple carbohydrates from their diet including Cakes, Sweet Desserts / Pastries, Ice Cream, Soda (diet and regular), Sweet Tea, Candies, Chips, Cookies, Sweet Pastries, Store Bought Juices, Alcohol in Excess of 1-2 drinks a day, Artificial Sweeteners, Coffee Creamer, and "Sugar-free" Products. This will help patient to have stable blood glucose profile and potentially avoid unintended weight gain.   - I encouraged the patient to switch to unprocessed or minimally processed complex starch and increased protein intake (animal or plant source), fruits, and vegetables.   - Patient is advised to stick to a routine mealtimes to eat 3 meals a day and avoid unnecessary snacks (to snack only to correct hypoglycemia).  - I have approached her with the following individualized plan to manage  her diabetes and patient agrees:   -She is advised to re-engage in healthy diet and increasing exercise as she is able.  She is advised to continue Metformin 1000 mg ER to be taken daily with breakfast and Glipizide 5 mg XL daily with breakfast.  I discussed potentially needing to add insulin if she cannot regain control and she is motivated to stay away from this.  -She is encouraged to continue monitoring blood glucose twice daily, before breakfast and before bed, and to call the clinic if she has readings less than 70 or greater than 200 for 3 tests in a row.  - Adjustment parameters are given to her for hypo and hyperglycemia in writing.  - she is not a candidate for incretin therapy due to significantly elevated  triglycerides and increased risk for pancreatitis.  - Specific targets for  A1c;  LDL, HDL,  and Triglycerides were discussed with the patient.  2) Blood Pressure /Hypertension:  her blood pressure is controlled to target.   she is advised to continue her current medications including Coreg 12.5 mg po twice daily and Losartan 50 mg p.o. daily with breakfast.  3) Lipids/Hyperlipidemia:    Review of her recent lipid panel from 05/19/20 showed controlled LDL at 25 and significantly improved triglycerides of 125.  she is advised to continue Lipitor 80 mg daily at bedtime and Omega 3 Krill oil caps po daily.  Side effects and precautions discussed with her.  Will repeat lipid panel prior to next visit.  4)  Weight/Diet:  her Body mass index is 37.58 kg/m.  -  clearly complicating her diabetes care.   she is a candidate for weight loss. I discussed with her the fact that loss of 5 - 10% of her  current body weight will have the most impact on her diabetes management.  Exercise, and detailed carbohydrates information provided  -  detailed on discharge instructions.  5) Chronic Care/Health Maintenance: -  she is on ACEI/ARB and Statin medications and is encouraged to initiate and continue to follow up with Ophthalmology, Dentist, Podiatrist at least yearly or according to recommendations, and advised to [place]. I have recommended yearly flu vaccine and pneumonia vaccine at least every 5 years; moderate intensity exercise for up to 150 minutes weekly; and sleep for at least 7 hours a day.  - she is advised to maintain close follow up with Jani Gravel, MD for primary care needs, as well as her other providers for optimal and coordinated care.    I spent 40 minutes in the care of the patient today including review of labs from [place], Lipids, Thyroid Function, Hematology (current and previous including abstractions from other facilities); face-to-face time discussing  her blood glucose readings/logs,  discussing hypoglycemia and hyperglycemia episodes and symptoms, medications doses, her options of short and long term treatment based on the latest standards of care / guidelines;  discussion about incorporating lifestyle medicine;  and documenting the encounter.    Please refer to Patient Instructions for Blood Glucose Monitoring and Insulin/Medications Dosing Guide"  in media tab for additional information. Please  also refer to " Patient Self Inventory" in the Media  tab for reviewed elements of pertinent patient history.  Rosaura Carpenter participated in the discussions, expressed understanding, and voiced agreement with the above plans.  All questions were answered to her satisfaction. she is encouraged to contact clinic should she have any questions or concerns prior to her return visit.   Follow up plan: - No follow-ups on file.  Rayetta Pigg, [facility] [address] [place], [place] [address] Phone: [phone] Fax: [phone]  04/29/2021, 4:23 PM

## 2021-05-03 ENCOUNTER — Encounter: Payer: Self-pay | Admitting: Orthopedic Surgery

## 2021-05-03 ENCOUNTER — Other Ambulatory Visit: Payer: Self-pay | Admitting: *Deleted

## 2021-05-03 MED ORDER — CARVEDILOL 12.5 MG PO TABS: 12.5000 mg | ORAL_TABLET | Freq: Two times a day (BID) | ORAL | 0 refills | Status: DC

## 2021-05-03 NOTE — Addendum Note (Signed)
Addended byCandice Camp on: 05/03/2021 09:24 AM   Modules accepted: Orders

## 2021-05-04 ENCOUNTER — Other Ambulatory Visit: Payer: Self-pay | Admitting: Orthopedic Surgery

## 2021-05-04 MED ORDER — OXYCODONE HCL 5 MG PO TABS: ORAL_TABLET | ORAL | 0 refills | Status: DC

## 2021-05-04 NOTE — Progress Notes (Signed)
Meds ordered this encounter  Medications   oxyCODONE (OXY IR/ROXICODONE) 5 MG immediate release tablet    Sig: 2 tabs every 4hrs as needed    Dispense:  60 tablet    Refill:  0

## 2021-05-19 ENCOUNTER — Encounter: Payer: Self-pay | Admitting: Orthopedic Surgery

## 2021-05-19 MED ORDER — OXYCODONE HCL 5 MG PO TABS: ORAL_TABLET | ORAL | 0 refills | Status: DC

## 2021-06-01 ENCOUNTER — Encounter: Payer: Self-pay | Admitting: Orthopedic Surgery

## 2021-06-01 MED ORDER — OXYCODONE HCL 5 MG PO TABS: ORAL_TABLET | ORAL | 0 refills | Status: DC

## 2021-06-02 ENCOUNTER — Ambulatory Visit: Payer: Self-pay | Admitting: Nutrition

## 2021-06-14 ENCOUNTER — Encounter: Payer: Self-pay | Admitting: Orthopedic Surgery

## 2021-06-14 MED ORDER — OXYCODONE HCL 5 MG PO TABS: ORAL_TABLET | ORAL | 0 refills | Status: DC

## 2021-06-16 ENCOUNTER — Encounter: Payer: Self-pay | Admitting: Cardiology

## 2021-06-16 ENCOUNTER — Ambulatory Visit (INDEPENDENT_AMBULATORY_CARE_PROVIDER_SITE_OTHER): Payer: 59 | Admitting: Cardiology

## 2021-06-16 VITALS — BP 140/70 | HR 82 | Ht 65.0 in | Wt 227.4 lb

## 2021-06-16 DIAGNOSIS — I10 Essential (primary) hypertension: Secondary | ICD-10-CM

## 2021-06-16 DIAGNOSIS — I2510 Atherosclerotic heart disease of native coronary artery without angina pectoris: Secondary | ICD-10-CM

## 2021-06-16 DIAGNOSIS — E782 Mixed hyperlipidemia: Secondary | ICD-10-CM

## 2021-06-16 NOTE — Patient Instructions (Signed)
Medication Instructions:  Your physician has recommended you make the following change in your medication:  Stop taking Aspirin  Labwork: None  Testing/Procedures: None  Follow-Up: Your physician recommends that you schedule a follow-up appointment in: 6 months  Any Other Special Instructions Will Be Listed Below (If Applicable).  If you need a refill on your cardiac medications before your next appointment, please call your pharmacy.

## 2021-06-16 NOTE — Progress Notes (Signed)
Clinical Summary [name] is a 51 y.o.female seen today for follow up of the following medical problems.   1.CAD/History of NSTEMI - 03/2020 cath: LAD mild diffuse disease, LCX mid 99% and distal 50%, RCA prox 70%. Received DES to LCX, DES to RCA 03/2020 echo: LVEF 65-70%, no WMAs - interventional had suggested plavix monotherapy after 1 year due to diffuse disease  - burning pain at time upper left chest.No specific pattern. No relation to food. Lasts a few minutes. Occurs about 1-2 times month. - compliant with meds   2.Hyperlipidemia -muscle aches on atorva 80 - she is on atorvastatin 40mg  daily.  Jan 2022 TC 89 TG 125 HDL 23 LDL 41   3.HTN - compliant with meds - at recent endo 124/76   4. DM2 - Hgb A1c 8.1   Works as Marine scientist at [employer].    Past Medical History:  Diagnosis Date   Arthritis    Carpal tunnel syndrome, bilateral    Coronary artery disease    Diabetes mellitus without complication ([place])    no meds; diet controlled   Diverticulitis    Hyperlipidemia    Phreesia 08/10/2020   Hypertension    Hypertriglyceridemia    Myocardial infarction [facility])    Phreesia 08/10/2020   PONV (postoperative nausea and vomiting)    Sciatica      Allergies  Allergen Reactions   Other    Flagyl [Metronidazole] Nausea Only   Hydrocodone Nausea And Vomiting and Other (See Comments)    Can take need to take with medication for nausea and vomitting     Current Outpatient Medications  Medication Sig Dispense Refill   albuterol (VENTOLIN HFA) 108 (90 Base) MCG/ACT inhaler Inhale 1-2 puffs into the lungs every 6 (six) hours as needed for wheezing or shortness of breath. (Patient not taking: Reported on 04/21/2021) 18 g 0   aspirin EC 81 MG tablet Take 81 mg by mouth daily. Swallow whole.     atorvastatin (LIPITOR) 40 MG tablet Take 1 tablet (40 mg total) by mouth daily. 90 tablet 2   carvedilol (COREG) 12.5 MG tablet Take 1 tablet (12.5 mg total) by mouth 2  (two) times daily. 180 tablet 0   cholecalciferol (VITAMIN D) 25 MCG (1000 UNIT) tablet Take 1,000 Units by mouth daily.     clopidogrel (PLAVIX) 75 MG tablet Take 1 tablet (75 mg total) by mouth daily. Take 4 tablets on day one then 1 tablet daily thereafter. (Patient taking differently: Take 75 mg by mouth daily.) 94 tablet 3   docusate sodium (COLACE) 100 MG capsule Take 1 capsule (100 mg total) by mouth 2 (two) times daily. 10 capsule 0   glipiZIDE (GLUCOTROL XL) 5 MG 24 hr tablet Take 1 tablet (5 mg total) by mouth daily with breakfast. 90 tablet 3   glucose blood (ONETOUCH VERIO) test strip Use as instructed to monitor blood glucose once daily. 100 each 12   Krill Oil (OMEGA-3) 500 MG CAPS Take 1,000 mg by mouth daily.     Lancets (ONETOUCH DELICA PLUS ERDEYC14G) MISC by Does not apply route.     losartan (COZAAR) 50 MG tablet Take 1 tablet (50 mg total) by mouth daily. 90 tablet 1   metFORMIN (GLUCOPHAGE-XR) 500 MG 24 hr tablet TAKE 2 TABLETS BY MOUTH ONCE DAILY WITH BREAKFAST 180 tablet 0   methocarbamol (ROBAXIN) 500 MG tablet TAKE 1 TABLET BY MOUTH EVERY 6 HOURS AS NEEDED FOR MUSCLE SPASM 56 tablet 0  nitroGLYCERIN (NITROSTAT) 0.4 MG SL tablet PLACE 1 TABLET (0.4 MG TOTAL) UNDER THE TONGUE EVERY 5 (FIVE) MINUTES AS NEEDED FOR CHEST PAIN. (Patient taking differently: Place 0.4 mg under the tongue every 5 (five) minutes as needed for chest pain.) 25 tablet 12   oxyCODONE (OXY IR/ROXICODONE) 5 MG immediate release tablet 2 tabs every 4hrs as needed 60 tablet 0   promethazine (PHENERGAN) 12.5 MG tablet Take 1 tablet (12.5 mg total) by mouth every 4 (four) hours as needed for nausea or vomiting. 42 tablet 5   No current facility-administered medications for this visit.     Past Surgical History:  Procedure Laterality Date   ablasion of uterus     CARDIAC CATHETERIZATION     CARPAL TUNNEL RELEASE  11/10/2010   Procedure: CARPAL TUNNEL RELEASE;  Surgeon: Arther Abbott, MD;  Location:  [facility];  Service: Orthopedics;  Laterality: Right;   CHOLECYSTECTOMY     [facility]   COLON RESECTION N/A 04/08/2015   Procedure: LAPAROSCOPIC HAND ASSISTED PARTIAL COLECTOMY  CONVERTED TO OPEN AT 1610;  Surgeon: Aviva Signs, MD;  Location: [facility];  Service: General;  Laterality: N/A;   COLONOSCOPY N/A 03/28/2014   Procedure: COLONOSCOPY;  Surgeon: Rogene Houston, MD;  Location: [facility];  Service: Endoscopy;  Laterality: N/A;  730   CORONARY STENT INTERVENTION  04/07/2020   CORONARY STENT INTERVENTION N/A 04/07/2020   Procedure: CORONARY STENT INTERVENTION;  Surgeon: Jettie Booze, MD;  Location: [place] [facility];  Service: Cardiovascular;  Laterality: N/A;   JOINT REPLACEMENT N/A    Phreesia 08/10/2020   KNEE ARTHROSCOPY WITH DRILLING/MICROFRACTURE Left 10/20/2016   Procedure: LEFT KNEE ARTHROSCOPY WITH MICROFRACTURE;  Surgeon: Carole Civil, MD;  Location: [facility];  Service: Orthopedics;  Laterality: Left;   KNEE ARTHROSCOPY WITH DRILLING/MICROFRACTURE Left 04/27/2017   Procedure: KNEE ARTHROSCOPY WITH DRILLING/MICROFRACTURE;  Surgeon: Carole Civil, MD;  Location: [facility];  Service: Orthopedics;  Laterality: Left;   KNEE ARTHROSCOPY WITH OSTEOCHONDRAL DEFECT REPAIR Left 04/27/2017   Procedure: KNEE ARTHROSCOPY WITH OSTEOCHONDRAL DEFECT REPAIR Autograft;  Surgeon: Carole Civil, MD;  Location: [facility];  Service: Orthopedics;  Laterality: Left;   KNEE SURGERY     LEFT HEART CATH AND CORONARY ANGIOGRAPHY N/A 04/07/2020   Procedure: LEFT HEART CATH AND CORONARY ANGIOGRAPHY;  Surgeon: Jettie Booze, MD;  Location: [place] [facility];  Service: Cardiovascular;  Laterality: N/A;   PARTIAL COLECTOMY N/A 04/08/2015   Procedure: PARTIAL COLECTOMY;  Surgeon: Aviva Signs, MD;  Location: [facility];  Service: General;  Laterality: N/A;   right knee arthroscopy     SMALL INTESTINE SURGERY N/A    Phreesia 08/10/2020   TOTAL KNEE ARTHROPLASTY Left 10/17/2017   Procedure:  TOTAL KNEE ARTHROPLASTY;  Surgeon: Carole Civil, MD;  Location: [facility];  Service: Orthopedics;  Laterality: Left;  [place]    TOTAL KNEE REVISION Left 10/20/2020   Procedure: REVISION TIBIAL COMPONENT LEFT TOTAL KNEE;  Surgeon: Carole Civil, MD;  Location: [facility];  Service: Orthopedics;  Laterality: Left;   TUBAL LIGATION       Allergies  Allergen Reactions   Other    Flagyl [Metronidazole] Nausea Only   Hydrocodone Nausea And Vomiting and Other (See Comments)    Can take need to take with medication for nausea and vomitting      Family History  Problem Relation Age of Onset   Diabetes type I Mother    Hyperlipidemia Mother    Diabetes  type II Father    Hypertension Father    Hyperlipidemia Father    Diabetes type II Brother    Diabetes type II Brother    Cancer Other    Diabetes Other    Arthritis Other    Asthma Other    Anesthesia problems Neg Hx      Social History Ms. Weins reports that she has been smoking cigarettes. She has a 39.00 pack-year smoking history. She has never used smokeless tobacco. Ms. Mitnick reports no history of alcohol use.   Review of Systems CONSTITUTIONAL: No weight loss, fever, chills, weakness or fatigue.  HEENT: Eyes: No visual loss, blurred vision, double vision or yellow sclerae.No hearing loss, sneezing, congestion, runny nose or sore throat.  SKIN: No rash or itching.  CARDIOVASCULAR: per hpi RESPIRATORY: No shortness of breath, cough or sputum.  GASTROINTESTINAL: No anorexia, nausea, vomiting or diarrhea. No abdominal pain or blood.  GENITOURINARY: No burning on urination, no polyuria NEUROLOGICAL: No headache, dizziness, syncope, paralysis, ataxia, numbness or tingling in the extremities. No change in bowel or bladder control.  MUSCULOSKELETAL: No muscle, back pain, joint pain or stiffness.  LYMPHATICS: No enlarged nodes. No history of splenectomy.  PSYCHIATRIC: No history of depression or anxiety.  ENDOCRINOLOGIC: No  reports of sweating, cold or heat intolerance. No polyuria or polydipsia.  Marland Kitchen   Physical Examination Today's Vitals   06/16/21 0815  BP: 140/70  Pulse: 82  SpO2: 97%  Weight: 227 lb 6.4 oz (103.1 kg)  Height: 5\' 5"  (1.651 m)   Body mass index is 37.84 kg/m.  Gen: resting comfortably, no acute distress HEENT: no scleral icterus, pupils equal round and reactive, no palptable cervical adenopathy,  CV: RRR, no m/r/g no jvd Resp: Clear to auscultation bilaterally GI: abdomen is soft, non-tender, non-distended, normal bowel sounds, no hepatosplenomegaly MSK: extremities are warm, no edema.  Skin: warm, no rash Neuro:  no focal deficits Psych: appropriate affect     Assessment and Plan  1.CAD - atypical burning pain overall infrequent, monitor at this time -over 1 year since her stent, interventional had recommended plavix as monotherapy given diffuse disease, will d/c aspirin and continue plavix  2. HTN - elevated here, has been at goal at other recent provider visits. Just took meds this AM, will monitor at this time  3. Hyperlipidemia - muscle aches on atorva 80, tolerating 40mg  daily.  - last LDL at goal, upcoming repeat with endocrinology - continue current meds   F/u 6 months       Arnoldo Lenis, M.D.

## 2021-06-23 ENCOUNTER — Other Ambulatory Visit: Payer: Self-pay

## 2021-06-23 ENCOUNTER — Ambulatory Visit (INDEPENDENT_AMBULATORY_CARE_PROVIDER_SITE_OTHER): Payer: 59 | Admitting: Orthopedic Surgery

## 2021-06-23 ENCOUNTER — Encounter: Payer: Self-pay | Admitting: Orthopedic Surgery

## 2021-06-23 DIAGNOSIS — M25562 Pain in left knee: Secondary | ICD-10-CM

## 2021-06-23 DIAGNOSIS — Z96652 Presence of left artificial knee joint: Secondary | ICD-10-CM

## 2021-06-23 DIAGNOSIS — G8929 Other chronic pain: Secondary | ICD-10-CM

## 2021-06-23 DIAGNOSIS — T84038D Mechanical loosening of other internal prosthetic joint, subsequent encounter: Secondary | ICD-10-CM | POA: Diagnosis not present

## 2021-06-23 DIAGNOSIS — Z96659 Presence of unspecified artificial knee joint: Secondary | ICD-10-CM

## 2021-06-23 MED ORDER — OXYCODONE HCL 5 MG PO TABS: ORAL_TABLET | ORAL | 0 refills | Status: DC

## 2021-06-23 MED ORDER — GABAPENTIN 100 MG PO CAPS
ORAL_CAPSULE | ORAL | 2 refills | Status: DC
Start: 2021-06-23 — End: 2021-11-22

## 2021-06-23 NOTE — Progress Notes (Signed)
Chief Complaint  ?Patient presents with  ? Follow-up  ?  DOS 10/20/20 ?S/p revision of TKR left knee ?Pt c/o pain  ? ?Encounter Diagnoses  ?Name Primary?  ? Mechanical loosening of prosthetic knee, subsequent encounter   ? Status post revision of total replacement of left knee Yes  ? S/P total knee replacement, left   ? Chronic pain of left knee   ? ?[name] is here for follow-up visit she had a painful left total knee which was revised on October 20, 2020 ? ?She is having persistent tibial pain running down to the ankle but no hip pain or thigh pain.  She has pain across the front of the knee across the patellar tendon and proximal tibia ? ?She is on oxycodone she takes 2 tablets twice a day in the morning and evening she still having trouble sleeping ? ?It is unclear at this time what is the cause of her symptoms.  She complains of a dull ache and says the opioids really do not help her. ? ?However she cannot take anti-inflammatories because she is on Plavix from a heart attack ? ?I think we will get much better relief of this if she can take anti-inflammatory but we cannot ? ?We discussed this at length.  We are going to try some gabapentin. ? ?When she comes back in June and I would like to get a bone scan ?Lab work-up ?4 views of her femur and tibia and work-up for prosthetic loosening versus infection although clinically she does not appear to have infection as the knee is not warm red and she does not have pain with passive range of motion which maintains 0 to 115 degrees ? ?Meds ordered this encounter  ?Medications  ? gabapentin (NEURONTIN) 100 MG capsule  ?  Sig: 100 mg in the morning 100 mg in the afternoon and 300 mg at night  ?  Dispense:  90 capsule  ?  Refill:  2  ? oxyCODONE (OXY IR/ROXICODONE) 5 MG immediate release tablet  ?  Sig: 2 tabs every 4hrs as needed  ?  Dispense:  60 tablet  ?  Refill:  0  ? ? ?

## 2021-07-04 ENCOUNTER — Other Ambulatory Visit: Payer: Self-pay | Admitting: Cardiology

## 2021-07-05 NOTE — Telephone Encounter (Signed)
Refilled plavix 75 mg qd, #30,REF:3 to [facility]  ?

## 2021-07-08 ENCOUNTER — Other Ambulatory Visit: Payer: Self-pay | Admitting: Nurse Practitioner

## 2021-07-14 ENCOUNTER — Other Ambulatory Visit: Payer: Self-pay | Admitting: Orthopedic Surgery

## 2021-07-14 ENCOUNTER — Encounter: Payer: Self-pay | Admitting: Orthopedic Surgery

## 2021-07-14 DIAGNOSIS — Z96652 Presence of left artificial knee joint: Secondary | ICD-10-CM

## 2021-07-14 DIAGNOSIS — Z96659 Presence of unspecified artificial knee joint: Secondary | ICD-10-CM

## 2021-07-14 DIAGNOSIS — G8929 Other chronic pain: Secondary | ICD-10-CM

## 2021-07-14 MED ORDER — OXYCODONE HCL 5 MG PO TABS: ORAL_TABLET | ORAL | 0 refills | Status: DC

## 2021-07-14 NOTE — Progress Notes (Signed)
Patient sent a message via MyChart asking for a refill of oxycodone.   ?

## 2021-07-29 ENCOUNTER — Ambulatory Visit: Payer: 59 | Admitting: Nurse Practitioner

## 2021-07-29 ENCOUNTER — Encounter: Payer: Self-pay | Admitting: Orthopedic Surgery

## 2021-07-29 ENCOUNTER — Other Ambulatory Visit: Payer: Self-pay | Admitting: Orthopedic Surgery

## 2021-07-29 DIAGNOSIS — G8929 Other chronic pain: Secondary | ICD-10-CM

## 2021-07-29 DIAGNOSIS — Z96659 Presence of unspecified artificial knee joint: Secondary | ICD-10-CM

## 2021-07-29 DIAGNOSIS — Z96652 Presence of left artificial knee joint: Secondary | ICD-10-CM

## 2021-07-29 MED ORDER — OXYCODONE HCL 5 MG PO TABS: ORAL_TABLET | ORAL | 0 refills | Status: DC

## 2021-07-29 NOTE — Progress Notes (Signed)
Patient sent a mychart message asking for refill medication.  ?

## 2021-08-01 ENCOUNTER — Other Ambulatory Visit: Payer: Self-pay | Admitting: Cardiology

## 2021-08-01 ENCOUNTER — Other Ambulatory Visit: Payer: Self-pay | Admitting: Nurse Practitioner

## 2021-08-08 ENCOUNTER — Other Ambulatory Visit: Payer: Self-pay | Admitting: Cardiology

## 2021-08-12 ENCOUNTER — Other Ambulatory Visit: Payer: Self-pay

## 2021-08-12 ENCOUNTER — Encounter: Payer: Self-pay | Admitting: Orthopedic Surgery

## 2021-08-12 DIAGNOSIS — G8929 Other chronic pain: Secondary | ICD-10-CM

## 2021-08-12 DIAGNOSIS — Z96652 Presence of left artificial knee joint: Secondary | ICD-10-CM

## 2021-08-12 DIAGNOSIS — T84038D Mechanical loosening of other internal prosthetic joint, subsequent encounter: Secondary | ICD-10-CM

## 2021-08-12 MED ORDER — OXYCODONE HCL 5 MG PO TABS: ORAL_TABLET | ORAL | 0 refills | Status: DC

## 2021-08-12 NOTE — Telephone Encounter (Signed)
Sent to provider 

## 2021-08-26 ENCOUNTER — Other Ambulatory Visit: Payer: Self-pay

## 2021-08-26 ENCOUNTER — Encounter: Payer: Self-pay | Admitting: Orthopedic Surgery

## 2021-08-26 DIAGNOSIS — G8929 Other chronic pain: Secondary | ICD-10-CM

## 2021-08-26 DIAGNOSIS — Z96659 Presence of unspecified artificial knee joint: Secondary | ICD-10-CM

## 2021-08-26 DIAGNOSIS — Z96652 Presence of left artificial knee joint: Secondary | ICD-10-CM

## 2021-08-26 MED ORDER — OXYCODONE HCL 5 MG PO TABS: ORAL_TABLET | ORAL | 0 refills | Status: DC

## 2021-08-26 NOTE — Telephone Encounter (Signed)
Request sent to provider.

## 2021-09-10 ENCOUNTER — Encounter: Payer: Self-pay | Admitting: Orthopedic Surgery

## 2021-09-10 ENCOUNTER — Other Ambulatory Visit: Payer: Self-pay

## 2021-09-10 DIAGNOSIS — G8929 Other chronic pain: Secondary | ICD-10-CM

## 2021-09-10 DIAGNOSIS — Z96652 Presence of left artificial knee joint: Secondary | ICD-10-CM

## 2021-09-10 DIAGNOSIS — Z96659 Presence of unspecified artificial knee joint: Secondary | ICD-10-CM

## 2021-09-10 MED ORDER — OXYCODONE HCL 5 MG PO TABS: ORAL_TABLET | ORAL | 0 refills | Status: DC

## 2021-09-10 NOTE — Telephone Encounter (Signed)
Request sent to provider.

## 2021-09-13 ENCOUNTER — Other Ambulatory Visit: Payer: Self-pay | Admitting: Orthopedic Surgery

## 2021-09-13 ENCOUNTER — Encounter: Payer: Self-pay | Admitting: Orthopedic Surgery

## 2021-09-13 DIAGNOSIS — T84038D Mechanical loosening of other internal prosthetic joint, subsequent encounter: Secondary | ICD-10-CM

## 2021-09-13 DIAGNOSIS — Z96652 Presence of left artificial knee joint: Secondary | ICD-10-CM

## 2021-09-13 DIAGNOSIS — G8929 Other chronic pain: Secondary | ICD-10-CM

## 2021-09-13 MED ORDER — OXYCODONE-ACETAMINOPHEN 5-325 MG PO TABS: 2.0000 | ORAL_TABLET | Freq: Three times a day (TID) | ORAL | 0 refills | Status: AC | PRN

## 2021-09-13 NOTE — Progress Notes (Signed)
Meds ordered this encounter  Medications   oxyCODONE-acetaminophen (PERCOCET/ROXICET) 5-325 MG tablet    Sig: Take 2 tablets by mouth every 8 (eight) hours as needed for up to 7 days for severe pain.    Dispense:  42 tablet    Refill:  0

## 2021-09-14 ENCOUNTER — Ambulatory Visit
Admission: EM | Admit: 2021-09-14 | Discharge: 2021-09-14 | Disposition: A | Discharge status: Home / Self Care | Payer: 59 | Attending: Nurse Practitioner | Admitting: Nurse Practitioner

## 2021-09-14 ENCOUNTER — Encounter: Payer: Self-pay | Admitting: Emergency Medicine

## 2021-09-14 ENCOUNTER — Other Ambulatory Visit: Payer: Self-pay

## 2021-09-14 DIAGNOSIS — J029 Acute pharyngitis, unspecified: Secondary | ICD-10-CM | POA: Diagnosis not present

## 2021-09-14 DIAGNOSIS — J069 Acute upper respiratory infection, unspecified: Secondary | ICD-10-CM | POA: Diagnosis not present

## 2021-09-14 LAB — POCT RAPID STREP A (OFFICE): Rapid Strep A Screen: NEGATIVE

## 2021-09-14 MED ORDER — LIDOCAINE VISCOUS HCL 2 % MT SOLN: 10.0000 mL | Freq: Four times a day (QID) | OROMUCOSAL | 0 refills | Status: DC | PRN

## 2021-09-14 MED ORDER — PROMETHAZINE-DM 6.25-15 MG/5ML PO SYRP: 5.0000 mL | ORAL_SOLUTION | Freq: Four times a day (QID) | ORAL | 0 refills | Status: DC | PRN

## 2021-09-14 NOTE — ED Provider Notes (Signed)
[facility]    CSN: [id number] Arrival date & time: 09/14/21  1436      History   Chief Complaint Chief Complaint  Patient presents with   Sore Throat    HPI [name] is a 51 y.o. female.    Sore Throat  Sore throat that started 1 day ago.  Patient also reports cough.  She denies headache, ear pain, shortness of breath, wheezing, difficulty breathing, runny nose, or ear pain.  Patient has been taking over-the-counter DayQuil/NyQuil for her symptoms.  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[place])    Chronic pain in left foot 08/31/2020   Coronary artery disease    Diabetes mellitus without complication ([place])    Hypertension    Hypertriglyceridemia    Non-ST elevation (NSTEMI) myocardial infarction ([place])    Chest pain 04/06/2020   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    Chondral defect of condyle of right femur    S/P left knee arthroscopy 04/27/17 03/28/2017   S/P total knee replacement, 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    Abdominal pain 02/17/2015   Nausea without vomiting 02/17/2015   Diverticulitis of intestine with abscess 01/08/2015   Acute diverticulitis 01/08/2015   Nausea 12/31/2014   Diverticulitis 12/31/2014   Hyperglycemia 12/31/2014   Medication reaction 12/31/2014   Diverticulitis of large intestine without perforation or abscess without bleeding    Thrush,  oral    Fatty liver 03/11/2014   Family hx of colon cancer requiring screening colonoscopy 03/11/2014   Rt flank pain 03/11/2014   Ulnar nerve compression 06/14/2011   Compartment syndrome, nontraumatic 06/08/2011   LOW BACK PAIN 11/07/2007   SCIATICA 11/07/2007   LUMBAR SPASM 11/07/2007    Past Surgical History:  Procedure Laterality Date   ablasion of uterus     CARDIAC CATHETERIZATION     CARPAL TUNNEL RELEASE  11/10/2010   Procedure: CARPAL TUNNEL RELEASE;  Surgeon: Arther Abbott, MD;  Location: [facility];  Service: Orthopedics;  Laterality: Right;   CHOLECYSTECTOMY     [facility]   COLON RESECTION N/A 04/08/2015   Procedure: LAPAROSCOPIC HAND ASSISTED PARTIAL COLECTOMY  CONVERTED TO OPEN AT 1829;  Surgeon: Aviva Signs, MD;  Location: [facility];  Service: General;  Laterality: N/A;   COLONOSCOPY N/A 03/28/2014   Procedure: COLONOSCOPY;  Surgeon: Rogene Houston, MD;  Location: [facility];  Service: Endoscopy;  Laterality: N/A;  730   CORONARY STENT INTERVENTION  04/07/2020   CORONARY STENT INTERVENTION N/A 04/07/2020   Procedure: CORONARY STENT INTERVENTION;  Surgeon: Jettie Booze, MD;  Location: [place];  Service: Cardiovascular;  Laterality: N/A;   JOINT REPLACEMENT N/A    Phreesia 08/10/2020   KNEE ARTHROSCOPY WITH DRILLING/MICROFRACTURE Left 10/20/2016   Procedure: LEFT KNEE ARTHROSCOPY WITH MICROFRACTURE;  Surgeon: Carole Civil, MD;  Location: [facility];  Service: Orthopedics;  Laterality: Left;   KNEE ARTHROSCOPY WITH DRILLING/MICROFRACTURE Left 04/27/2017   Procedure: KNEE ARTHROSCOPY WITH DRILLING/MICROFRACTURE;  Surgeon: Carole Civil, MD;  Location: [facility];  Service: Orthopedics;  Laterality: Left;   KNEE ARTHROSCOPY WITH OSTEOCHONDRAL DEFECT REPAIR Left 04/27/2017   Procedure: KNEE ARTHROSCOPY WITH OSTEOCHONDRAL DEFECT REPAIR Autograft;  Surgeon: Carole Civil, MD;  Location: [facility];  Service: Orthopedics;  Laterality: Left;   KNEE SURGERY      LEFT HEART CATH AND CORONARY ANGIOGRAPHY N/A 04/07/2020   Procedure: LEFT HEART CATH AND CORONARY ANGIOGRAPHY;  Surgeon: Jettie Booze, MD;  Location: [facility];  Service: Cardiovascular;  Laterality: N/A;   PARTIAL COLECTOMY N/A 04/08/2015   Procedure: PARTIAL COLECTOMY;  Surgeon: Aviva Signs, MD;  Location: [facility];  Service: General;  Laterality: N/A;   right knee arthroscopy     SMALL INTESTINE SURGERY N/A    Phreesia 08/10/2020   TOTAL KNEE ARTHROPLASTY Left 10/17/2017   Procedure: TOTAL KNEE ARTHROPLASTY;  Surgeon: Carole Civil, MD;  Location: [facility];  Service: Orthopedics;  Laterality: Left;  [place]    TOTAL KNEE REVISION Left 10/20/2020   Procedure: REVISION TIBIAL COMPONENT LEFT TOTAL KNEE;  Surgeon: Carole Civil, MD;  Location: [facility];  Service: Orthopedics;  Laterality: Left;   TUBAL LIGATION      OB History   No obstetric history on file.      Home Medications    Prior to Admission medications   Medication Sig Start Date End Date Taking? Authorizing Provider  lidocaine (XYLOCAINE) 2 % solution Use as directed 10 mLs in the mouth or throat every 6 (six) hours as needed for mouth pain. 09/14/21  Yes Delray Reza-Warren, Alda Lea, NP  promethazine-dextromethorphan (PROMETHAZINE-DM) 6.25-15 MG/5ML syrup Take 5 mLs by mouth 4 (four) times daily as needed for cough. 09/14/21  Yes Fox Salminen-Warren, Alda Lea, NP  albuterol (VENTOLIN HFA) 108 (90 Base) MCG/ACT inhaler Inhale 1-2 puffs into the lungs every 6 (six) hours as needed for wheezing or shortness of breath. 03/15/21   Chase Picket, MD  aspirin EC 81 MG tablet Take 81 mg by mouth daily. Swallow whole.    [provider]  atorvastatin (LIPITOR) 40 MG tablet Take 1 tablet (40 mg total) by mouth daily. 02/10/21   Verta Ellen., NP  carvedilol (COREG) 12.5 MG tablet Take 1 tablet by mouth twice daily 08/09/21   Arnoldo Lenis, MD  cholecalciferol (VITAMIN D) 25 MCG (1000 UNIT) tablet  Take 1,000 Units by mouth daily.    [provider]  clopidogrel (PLAVIX) 75 MG tablet Take 1 tablet (75 mg total) by mouth daily. 07/05/21   Arnoldo Lenis, MD  docusate sodium (COLACE) 100 MG capsule Take 1 capsule (100 mg total) by mouth 2 (two) times daily. 10/21/20   Carole Civil, MD  gabapentin (NEURONTIN) 100 MG capsule 100 mg in the morning 100 mg in the afternoon and 300 mg at night 06/23/21   Carole Civil, MD  glipiZIDE (GLUCOTROL XL) 5 MG 24 hr tablet Take 1 tablet by mouth once daily with breakfast 08/02/21   Brita Romp, NP  glucose blood (ONETOUCH VERIO) test strip Use as instructed to monitor blood glucose once daily. 05/07/20   Brita Romp, NP  Javier Docker Oil (OMEGA-3) 500 MG CAPS Take 1,000 mg by mouth daily.    [provider]  Lancets [facility] DELICA PLUS [id number]) [place] by Does  not apply route.    [provider]  losartan (COZAAR) 50 MG tablet Take 1 tablet by mouth once daily 08/02/21   Arnoldo Lenis, MD  metFORMIN (GLUCOPHAGE-XR) 500 MG 24 hr tablet TAKE 2 TABLETS BY MOUTH ONCE DAILY WITH BREAKFAST 07/09/21   Brita Romp, NP  methocarbamol (ROBAXIN) 500 MG tablet TAKE 1 TABLET BY MOUTH EVERY 6 HOURS AS NEEDED FOR MUSCLE SPASM 02/22/21   Carole Civil, MD  nitroGLYCERIN (NITROSTAT) 0.4 MG SL tablet PLACE 1 TABLET (0.4 MG TOTAL) UNDER THE TONGUE EVERY 5 (FIVE) MINUTES AS NEEDED FOR CHEST PAIN. Patient taking differently: Place 0.4 mg under the tongue every 5 (five) minutes as needed for chest pain. 04/08/20 06/16/38  Leanor Kail, PA  oxyCODONE (OXY IR/ROXICODONE) 5 MG immediate release tablet 2 tabs every 4hrs as needed 09/10/21   Carole Civil, MD  oxyCODONE-acetaminophen (PERCOCET/ROXICET) 5-325 MG tablet Take 2 tablets by mouth every 8 (eight) hours as needed for up to 7 days for severe pain. 09/13/21 09/20/21  Carole Civil, MD  promethazine (PHENERGAN) 12.5 MG tablet Take 1 tablet (12.5 mg total)  by mouth every 4 (four) hours as needed for nausea or vomiting. 11/04/20   Carole Civil, MD  pantoprazole (PROTONIX) 40 MG tablet Take 1 tablet (40 mg total) by mouth daily. Further refills by PCP Patient not taking: Reported on 05/18/2020 04/08/20 05/18/20  Leanor Kail, PA    Family History Family History  Problem Relation Age of Onset   Diabetes type I Mother    Hyperlipidemia Mother    Diabetes type II Father    Hypertension Father    Hyperlipidemia Father    Diabetes type II Brother    Diabetes type II Brother    Cancer Other    Diabetes Other    Arthritis Other    Asthma Other    Anesthesia problems Neg Hx     Social History Social History   Tobacco Use   Smoking status: Every Day    Packs/day: 1.50    Years: 26.00    Pack years: 39.00    Types: Cigarettes    Last attempt to quit: 04/05/2020    Years since quitting: 1.4   Smokeless tobacco: Never  Vaping Use   Vaping Use: Never used  Substance Use Topics   Alcohol use: No    Alcohol/week: 0.0 standard drinks   Drug use: No     Allergies   Other, Flagyl [metronidazole], and Hydrocodone   Review of Systems Review of Systems PER HPI  Physical Exam Triage Vital Signs ED Triage Vitals [09/14/21 1603]  Enc Vitals Group     BP (!) 144/89     Pulse Rate 81     Resp 18     Temp 99.7 F (37.6 C)     Temp Source Oral     SpO2 96 %     Weight 208 lb (94.3 kg)     Height '5\' 5"'$  (1.651 m)     Head Circumference      Peak Flow      Pain Score 8     Pain Loc      Pain Edu?      Excl. in [place]?    No data found.  Updated Vital Signs BP (!) 144/89 (BP Location: Right Arm)   Pulse 81   Temp 99.7 F (37.6 C) (Oral)   Resp 18   Ht '5\' 5"'$  (1.651 m)   Wt 208  lb (94.3 kg)   SpO2 96%   BMI 34.61 kg/m   Visual Acuity Right Eye Distance:   Left Eye Distance:   Bilateral Distance:    Right Eye Near:   Left Eye Near:    Bilateral Near:     Physical Exam Vitals and nursing note reviewed.   Constitutional:      Appearance: She is well-developed.  HENT:     Head: Normocephalic.     Right Ear: Tympanic membrane and ear canal normal.     Left Ear: Tympanic membrane normal.     Nose: Congestion and rhinorrhea present.     Mouth/Throat:     Mouth: Mucous membranes are moist.     Pharynx: Uvula midline. Pharyngeal swelling and posterior oropharyngeal erythema present.     Tonsils: No tonsillar exudate. 1+ on the right. 1+ on the left.  Eyes:     Conjunctiva/sclera: Conjunctivae normal.     Pupils: Pupils are equal, round, and reactive to light.  Cardiovascular:     Rate and Rhythm: Normal rate and regular rhythm.     Heart sounds: Normal heart sounds.  Abdominal:     General: Bowel sounds are normal.     Palpations: Abdomen is soft.     Tenderness: There is no abdominal tenderness.  Musculoskeletal:     Cervical back: Normal range of motion.  Lymphadenopathy:     Cervical: No cervical adenopathy.  Skin:    General: Skin is warm and dry.  Neurological:     General: No focal deficit present.     Mental Status: She is alert and oriented to person, place, and time.  Psychiatric:        Mood and Affect: Mood normal.        Behavior: Behavior normal.     UC Treatments / Results  Labs (all labs ordered are listed, but only abnormal results are displayed) Labs Reviewed  CULTURE, GROUP A STREP [facility])  POCT RAPID STREP A (OFFICE)    EKG   Radiology No results found.  Procedures Procedures (including critical care time)  Medications Ordered in UC Medications - No data to display  Initial Impression / Assessment and Plan / UC Course  I have reviewed the triage vital signs and the nursing notes.  Pertinent labs & imaging results that were available during my care of the patient were reviewed by me and considered in my medical decision making (see chart for details).  Symptoms of cough and sore throat started 1 day ago.  Patient has not had fever, chills,  runny nose.  Otherwise vital signs and exam are reassuring.  Rapid strep negative, throat culture pending.  Treat with viscous lidocaine and Promethazine DM, over-the-counter fever reducers, supportive care. Strict return precautions were provided, patient's mother advised to follow-up as needed.  Final Clinical Impressions(s) / UC Diagnoses   Final diagnoses:  Sore throat  Viral upper respiratory infection     Discharge Instructions      Your rapid strep test is negative today.  A throat culture has been ordered for confirmatory testing.  If the results are positive, you will be contacted and provided treatment. Increase fluids and allow for plenty of rest. Recommend over-the-counter ibuprofen to help with throat pain and inflammation.  May take 3 to 4 tablets every 8 hours needed, take medication with food and water. Sleep elevated on 2 pillows to help with cough.  Also recommend using a humidifier. Warm salt water gargles 3-4 times daily  to help with throat pain or discomfort. Recommend a soft diet until your throat symptoms improve to include soups, broths, yogurts, Jell-O, puddings, popsicles, warm or cool liquids. Follow-up if your symptoms do not improve within the next 7 to 10 days.     ED Prescriptions     Medication Sig Dispense Auth. Provider   promethazine-dextromethorphan (PROMETHAZINE-DM) 6.25-15 MG/5ML syrup Take 5 mLs by mouth 4 (four) times daily as needed for cough. 140 mL Allia Wiltsey-Warren, Alda Lea, NP   lidocaine (XYLOCAINE) 2 % solution Use as directed 10 mLs in the mouth or throat every 6 (six) hours as needed for mouth pain. 100 mL Linde Wilensky-Warren, Alda Lea, NP      PDMP not reviewed this encounter.   Tish Men, NP 09/14/21 1644

## 2021-09-14 NOTE — ED Triage Notes (Signed)
Pt reports cough, sore throat since last night. Pt denies any known fevers, body aches, gi/gu symptoms.

## 2021-09-14 NOTE — Discharge Instructions (Addendum)
Your rapid strep test is negative today.  A throat culture has been ordered for confirmatory testing.  If the results are positive, you will be contacted and provided treatment. Increase fluids and allow for plenty of rest. Recommend over-the-counter ibuprofen to help with throat pain and inflammation.  May take 3 to 4 tablets every 8 hours needed, take medication with food and water. Sleep elevated on 2 pillows to help with cough.  Also recommend using a humidifier. Warm salt water gargles 3-4 times daily to help with throat pain or discomfort. Recommend a soft diet until your throat symptoms improve to include soups, broths, yogurts, Jell-O, puddings, popsicles, warm or cool liquids. Follow-up if your symptoms do not improve within the next 7 to 10 days.

## 2021-09-17 LAB — CULTURE, GROUP A STREP (THRC)

## 2021-09-23 ENCOUNTER — Ambulatory Visit (INDEPENDENT_AMBULATORY_CARE_PROVIDER_SITE_OTHER): Payer: 59

## 2021-09-23 ENCOUNTER — Ambulatory Visit (INDEPENDENT_AMBULATORY_CARE_PROVIDER_SITE_OTHER): Payer: 59 | Admitting: Orthopedic Surgery

## 2021-09-23 DIAGNOSIS — T84038D Mechanical loosening of other internal prosthetic joint, subsequent encounter: Secondary | ICD-10-CM

## 2021-09-23 DIAGNOSIS — M25562 Pain in left knee: Secondary | ICD-10-CM

## 2021-09-23 DIAGNOSIS — Z96659 Presence of unspecified artificial knee joint: Secondary | ICD-10-CM

## 2021-09-23 DIAGNOSIS — G8929 Other chronic pain: Secondary | ICD-10-CM

## 2021-09-23 DIAGNOSIS — Z96652 Presence of left artificial knee joint: Secondary | ICD-10-CM

## 2021-09-23 MED ORDER — OXYCODONE HCL 5 MG PO TABS: ORAL_TABLET | ORAL | 0 refills | Status: DC

## 2021-09-23 NOTE — Progress Notes (Signed)
FOLLOW UP   Encounter Diagnoses  Name Primary?   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/20/20    Chronic pain of left knee    Mechanical loosening of prosthetic knee, subsequent encounter Yes   Status post revision of total replacement of left knee    S/P total knee replacement, left      Chief Complaint  Patient presents with   s/p TKR LT revision    DOS 10/20/20 Knee still hurting all the time   [name] is 51 years old she had an index procedure left total knee in June 2019 this was revised for loosening confirmed by bone scan on June 2022  She still complains of aching in the left knee across the proximal tibia and somewhat into the left tibia  She complains of pain at rest and pain at night  Her range of motion is still good there is no effusion her range of motion is 0-1 15 with weakness of extension  Were going to order a bone scan R/o aseptic loosening     Taking oxycodone 10 mg Q6  Refill   Meds ordered this encounter  Medications   oxyCODONE (OXY IR/ROXICODONE) 5 MG immediate release tablet    Sig: 2 tabs every 4hrs as needed    Dispense:  60 tablet    Refill:  0

## 2021-09-29 ENCOUNTER — Telehealth: Payer: Self-pay | Admitting: Radiology

## 2021-09-29 NOTE — Telephone Encounter (Signed)
Left message for patient to see what her new insurance is to see if we need authorization for the bone scan  She will call back, just need to know what it is and if we can get cards

## 2021-09-30 ENCOUNTER — Other Ambulatory Visit: Payer: Self-pay | Admitting: Nurse Practitioner

## 2021-09-30 ENCOUNTER — Telehealth: Payer: Self-pay | Admitting: Radiology

## 2021-09-30 DIAGNOSIS — E1165 Type 2 diabetes mellitus with hyperglycemia: Secondary | ICD-10-CM

## 2021-09-30 NOTE — Telephone Encounter (Signed)
Noted  

## 2021-09-30 NOTE — Telephone Encounter (Signed)
-----   Message from [place] sent at 09/30/2021 11:33 AM EDT ----- Regarding: bone scan Patient does not have insurance right now. She will call central scheduling and schedule her appt when she gets her cards. May be July 1st. Told her to bring cards in to have them scanned.    -Abby

## 2021-09-30 NOTE — Telephone Encounter (Signed)
-----   Message from [place] sent at 09/30/2021 11:33 AM EDT ----- Regarding: bone scan Patient does not have insurance right now. She will call central scheduling and schedule her appt when she gets her cards. May be July 1st. Told her to bring cards in to have them scanned.    -Abby

## 2021-09-30 NOTE — Telephone Encounter (Signed)
Noted thanks °

## 2021-10-04 ENCOUNTER — Ambulatory Visit: Payer: 59 | Admitting: Nurse Practitioner

## 2021-10-04 ENCOUNTER — Encounter: Payer: Self-pay | Admitting: Orthopedic Surgery

## 2021-10-04 DIAGNOSIS — Z96652 Presence of left artificial knee joint: Secondary | ICD-10-CM

## 2021-10-04 DIAGNOSIS — T84038D Mechanical loosening of other internal prosthetic joint, subsequent encounter: Secondary | ICD-10-CM

## 2021-10-04 DIAGNOSIS — G8929 Other chronic pain: Secondary | ICD-10-CM

## 2021-10-04 MED ORDER — OXYCODONE HCL 5 MG PO TABS: ORAL_TABLET | ORAL | 0 refills | Status: DC

## 2021-10-05 ENCOUNTER — Other Ambulatory Visit: Payer: Self-pay

## 2021-10-05 MED ORDER — CLOPIDOGREL BISULFATE 75 MG PO TABS: 75.0000 mg | ORAL_TABLET | Freq: Every day | ORAL | 3 refills | Status: DC

## 2021-10-07 ENCOUNTER — Ambulatory Visit: Payer: 59 | Admitting: Orthopedic Surgery

## 2021-10-13 ENCOUNTER — Encounter: Payer: Self-pay | Admitting: Orthopedic Surgery

## 2021-10-14 ENCOUNTER — Other Ambulatory Visit: Payer: Self-pay

## 2021-10-14 DIAGNOSIS — T84038D Mechanical loosening of other internal prosthetic joint, subsequent encounter: Secondary | ICD-10-CM

## 2021-10-14 DIAGNOSIS — Z96659 Presence of unspecified artificial knee joint: Secondary | ICD-10-CM

## 2021-10-14 DIAGNOSIS — G8929 Other chronic pain: Secondary | ICD-10-CM

## 2021-10-14 DIAGNOSIS — Z96652 Presence of left artificial knee joint: Secondary | ICD-10-CM

## 2021-10-14 MED ORDER — OXYCODONE HCL 5 MG PO TABS: ORAL_TABLET | ORAL | 0 refills | Status: DC

## 2021-10-18 ENCOUNTER — Telehealth: Payer: Self-pay | Admitting: Radiology

## 2021-10-21 ENCOUNTER — Other Ambulatory Visit: Payer: Self-pay | Admitting: Nurse Practitioner

## 2021-10-25 ENCOUNTER — Encounter: Payer: Self-pay | Admitting: Orthopedic Surgery

## 2021-10-25 ENCOUNTER — Other Ambulatory Visit: Payer: Self-pay | Admitting: Radiology

## 2021-10-25 DIAGNOSIS — T84038D Mechanical loosening of other internal prosthetic joint, subsequent encounter: Secondary | ICD-10-CM

## 2021-10-25 DIAGNOSIS — Z96652 Presence of left artificial knee joint: Secondary | ICD-10-CM

## 2021-10-25 DIAGNOSIS — G8929 Other chronic pain: Secondary | ICD-10-CM

## 2021-10-25 MED ORDER — OXYCODONE HCL 5 MG PO TABS: ORAL_TABLET | ORAL | 0 refills | Status: DC

## 2021-10-28 ENCOUNTER — Encounter (HOSPITAL_COMMUNITY)
Admission: RE | Admit: 2021-10-28 | Discharge: 2021-10-28 | Disposition: A | Discharge status: Home / Self Care | Payer: 59 | Source: Ambulatory Visit | Attending: Orthopedic Surgery | Admitting: Orthopedic Surgery

## 2021-10-28 DIAGNOSIS — Z9889 Other specified postprocedural states: Secondary | ICD-10-CM | POA: Diagnosis not present

## 2021-10-28 DIAGNOSIS — Z471 Aftercare following joint replacement surgery: Secondary | ICD-10-CM | POA: Diagnosis not present

## 2021-10-28 DIAGNOSIS — Z96652 Presence of left artificial knee joint: Secondary | ICD-10-CM | POA: Insufficient documentation

## 2021-10-28 DIAGNOSIS — M25562 Pain in left knee: Secondary | ICD-10-CM | POA: Diagnosis not present

## 2021-10-28 DIAGNOSIS — G8929 Other chronic pain: Secondary | ICD-10-CM | POA: Diagnosis not present

## 2021-10-28 MED ORDER — TECHNETIUM TC 99M MEDRONATE IV KIT
20.0000 | PACK | Freq: Once | INTRAVENOUS | Status: AC | PRN
Start: 2021-10-28 — End: 2021-10-28
  Administered 2021-10-28: 19.4 via INTRAVENOUS

## 2021-10-29 ENCOUNTER — Other Ambulatory Visit: Payer: Self-pay | Admitting: Nurse Practitioner

## 2021-10-29 DIAGNOSIS — E1165 Type 2 diabetes mellitus with hyperglycemia: Secondary | ICD-10-CM

## 2021-10-31 ENCOUNTER — Other Ambulatory Visit: Payer: Self-pay | Admitting: Nurse Practitioner

## 2021-11-03 ENCOUNTER — Telehealth: Payer: Self-pay | Admitting: Radiology

## 2021-11-03 ENCOUNTER — Encounter: Payer: Self-pay | Admitting: Orthopedic Surgery

## 2021-11-03 NOTE — Telephone Encounter (Signed)
Bone scan results in system  IMPRESSION: Expected postoperative changes of the LEFT knee without scintigraphic evidence of aseptic loosening or infection of the LEFT knee prosthesis.  She has gotten these results in mychart States knee feels worse than before 2nd surgery, can you call her with results/ discuss next steps or I can call her.  She has appointment scheduled but has a work conflict

## 2021-11-04 ENCOUNTER — Encounter: Payer: Self-pay | Admitting: Nurse Practitioner

## 2021-11-04 ENCOUNTER — Encounter: Payer: Self-pay | Admitting: Orthopedic Surgery

## 2021-11-04 DIAGNOSIS — G8929 Other chronic pain: Secondary | ICD-10-CM

## 2021-11-04 DIAGNOSIS — Z96652 Presence of left artificial knee joint: Secondary | ICD-10-CM

## 2021-11-04 DIAGNOSIS — T84038D Mechanical loosening of other internal prosthetic joint, subsequent encounter: Secondary | ICD-10-CM

## 2021-11-04 MED ORDER — OXYCODONE HCL 5 MG PO TABS: ORAL_TABLET | ORAL | 0 refills | Status: DC

## 2021-11-04 MED ORDER — GLIPIZIDE ER 5 MG PO TB24
5.0000 mg | ORAL_TABLET | Freq: Every day | ORAL | 0 refills | Status: DC
Start: 2021-11-04 — End: 2021-11-22

## 2021-11-09 ENCOUNTER — Telehealth: Payer: Self-pay | Admitting: Orthopedic Surgery

## 2021-11-09 NOTE — Telephone Encounter (Signed)
Phone call placed at 3:47 PM on November 09, 2021  Patient advised to call me on my personal number or discuss her current situation on the appointment scheduled for July 20

## 2021-11-11 ENCOUNTER — Ambulatory Visit (INDEPENDENT_AMBULATORY_CARE_PROVIDER_SITE_OTHER): Payer: 59 | Admitting: Orthopedic Surgery

## 2021-11-11 ENCOUNTER — Encounter: Payer: Self-pay | Admitting: Nurse Practitioner

## 2021-11-11 ENCOUNTER — Encounter: Payer: Self-pay | Admitting: Orthopedic Surgery

## 2021-11-11 ENCOUNTER — Ambulatory Visit (INDEPENDENT_AMBULATORY_CARE_PROVIDER_SITE_OTHER): Payer: 59

## 2021-11-11 DIAGNOSIS — M79605 Pain in left leg: Secondary | ICD-10-CM

## 2021-11-11 DIAGNOSIS — M4306 Spondylolysis, lumbar region: Secondary | ICD-10-CM | POA: Diagnosis not present

## 2021-11-11 DIAGNOSIS — M545 Low back pain, unspecified: Secondary | ICD-10-CM

## 2021-11-11 DIAGNOSIS — Z96652 Presence of left artificial knee joint: Secondary | ICD-10-CM | POA: Diagnosis not present

## 2021-11-11 DIAGNOSIS — E1165 Type 2 diabetes mellitus with hyperglycemia: Secondary | ICD-10-CM | POA: Diagnosis not present

## 2021-11-11 DIAGNOSIS — M25552 Pain in left hip: Secondary | ICD-10-CM | POA: Diagnosis not present

## 2021-11-11 MED ORDER — METFORMIN HCL ER 500 MG PO TB24
1000.0000 mg | ORAL_TABLET | Freq: Every day | ORAL | 0 refills | Status: DC
Start: 2021-11-11 — End: 2021-11-22

## 2021-11-11 NOTE — Patient Instructions (Signed)
While we are working on your approval for MRI please go ahead and call to schedule your appointment with [facility] within at least one (1) week.   Central Scheduling [phone]  

## 2021-11-11 NOTE — Progress Notes (Signed)
Chief Complaint  Patient presents with   Results    Review bone scan   Knee Pain    Left over one year out from revision of left total knee replacement 10/20/20 very painful     [name] is really struggling.  She is 51 years old she had a left total knee and then had it revised but still continue to complain of pain  Prior to the knee replacement surgery she had left knee arthroscopy in 2019 she had a chondral defect had microfracture surgery and also had oats type procedure  We ordered a bone scan because she was still having so much pain primarily in the tibia.  She is on oxycodone she takes it as ordered she says she cannot function without the medication.  She never really gets complete relief even with the oxycodone  The bone scan showed no evidence of loosening or infection.  She is also had a work-up for infection with lab work and never had any positive values  As I discussed with her and her husband we have to work-up for another source of pain.  She has a history of sciatica lumbar spasm and low back pain although this was years ago  I ordered x-rays of her hip and spine her hip x-rays look normal her spine x-rays (see report) show some spondylosis some scoliotic list of the left some flattening of the lumbar curvature and perhaps an L5-S1 spondylolysis  I cannot imagine with a normal bone scan normal x-rays that she has anything wrong with her knee prosthesis  We will do an MRI of her spine to look for nerve root impingement

## 2021-11-12 LAB — COMPREHENSIVE METABOLIC PANEL
ALT: 36 IU/L — ABNORMAL HIGH (ref 0–32)
AST: 19 IU/L (ref 0–40)
Albumin/Globulin Ratio: 1.9 (ref 1.2–2.2)
Albumin: 4.3 g/dL (ref 3.9–4.9)
Alkaline Phosphatase: 95 IU/L (ref 44–121)
BUN/Creatinine Ratio: 22 (ref 9–23)
BUN: 12 mg/dL (ref 6–24)
Bilirubin Total: 0.5 mg/dL (ref 0.0–1.2)
CO2: 19 mmol/L — ABNORMAL LOW (ref 20–29)
Calcium: 9.4 mg/dL (ref 8.7–10.2)
Chloride: 101 mmol/L (ref 96–106)
Creatinine, Ser: 0.55 mg/dL — ABNORMAL LOW (ref 0.57–1.00)
Globulin, Total: 2.3 g/dL (ref 1.5–4.5)
Glucose: 220 mg/dL — ABNORMAL HIGH (ref 70–99)
Potassium: 4.5 mmol/L (ref 3.5–5.2)
Sodium: 137 mmol/L (ref 134–144)
Total Protein: 6.6 g/dL (ref 6.0–8.5)
eGFR: 112 mL/min/{1.73_m2} (ref >59)

## 2021-11-12 LAB — LIPID PANEL
Chol/HDL Ratio: 4.2 ratio (ref 0.0–4.4)
Cholesterol, Total: 118 mg/dL (ref 100–199)
HDL: 28 mg/dL — ABNORMAL LOW (ref >39)
LDL Chol Calc (NIH): 67 mg/dL (ref 0–99)
Triglycerides: 128 mg/dL (ref 0–149)
VLDL Cholesterol Cal: 23 mg/dL (ref 5–40)

## 2021-11-12 LAB — T4, FREE: Free T4: 1.34 ng/dL (ref 0.82–1.77)

## 2021-11-12 LAB — TSH: TSH: 1.72 u[IU]/mL (ref 0.450–4.500)

## 2021-11-12 LAB — VITAMIN D 25 HYDROXY (VIT D DEFICIENCY, FRACTURES): Vit D, 25-Hydroxy: 16 ng/mL — ABNORMAL LOW (ref 30.0–100.0)

## 2021-11-16 ENCOUNTER — Encounter: Payer: Self-pay | Admitting: Orthopedic Surgery

## 2021-11-16 DIAGNOSIS — Z96652 Presence of left artificial knee joint: Secondary | ICD-10-CM

## 2021-11-16 DIAGNOSIS — Z96659 Presence of unspecified artificial knee joint: Secondary | ICD-10-CM

## 2021-11-16 DIAGNOSIS — G8929 Other chronic pain: Secondary | ICD-10-CM

## 2021-11-17 MED ORDER — OXYCODONE HCL 5 MG PO TABS: ORAL_TABLET | ORAL | 0 refills | Status: DC

## 2021-11-18 ENCOUNTER — Telehealth: Payer: Self-pay

## 2021-11-18 NOTE — Telephone Encounter (Signed)
Patient called asking if you would please give her a call at your earliest convenience.  [phone]

## 2021-11-19 NOTE — Telephone Encounter (Signed)
Spoke with patient. She will go to GI for MRI

## 2021-11-22 ENCOUNTER — Ambulatory Visit (INDEPENDENT_AMBULATORY_CARE_PROVIDER_SITE_OTHER): Payer: 59 | Admitting: Nurse Practitioner

## 2021-11-22 ENCOUNTER — Encounter: Payer: Self-pay | Admitting: Nurse Practitioner

## 2021-11-22 VITALS — BP 124/84 | HR 66 | Ht 65.0 in | Wt 220.0 lb

## 2021-11-22 DIAGNOSIS — E1165 Type 2 diabetes mellitus with hyperglycemia: Secondary | ICD-10-CM

## 2021-11-22 DIAGNOSIS — I10 Essential (primary) hypertension: Secondary | ICD-10-CM | POA: Diagnosis not present

## 2021-11-22 DIAGNOSIS — E781 Pure hyperglyceridemia: Secondary | ICD-10-CM

## 2021-11-22 DIAGNOSIS — E782 Mixed hyperlipidemia: Secondary | ICD-10-CM | POA: Diagnosis not present

## 2021-11-22 LAB — POCT GLYCOSYLATED HEMOGLOBIN (HGB A1C): Hemoglobin A1C: 8.1 % — AB (ref 4.0–5.6)

## 2021-11-22 MED ORDER — VITAMIN D (ERGOCALCIFEROL) 1.25 MG (50000 UNIT) PO CAPS: 50000.0000 [IU] | ORAL_CAPSULE | ORAL | 1 refills | Status: DC

## 2021-11-22 MED ORDER — METFORMIN HCL ER 500 MG PO TB24: 1000.0000 mg | ORAL_TABLET | Freq: Every day | ORAL | 3 refills | Status: DC

## 2021-11-22 MED ORDER — GLIPIZIDE ER 5 MG PO TB24
5.0000 mg | ORAL_TABLET | Freq: Every day | ORAL | 3 refills | Status: DC
Start: 2021-11-22 — End: 2022-07-12

## 2021-11-22 NOTE — Progress Notes (Signed)
Endocrinology Follow Up Note       11/22/2021, 3:44 PM   Subjective:    Patient ID: [name], female    DOB: 10-26-1970.  [name] is being seen in follow up after being seen in consultation for management of currently uncontrolled symptomatic diabetes requested by  Jani Gravel, MD.   Past Medical History:  Diagnosis Date   Arthritis    Carpal tunnel syndrome, bilateral    Coronary artery disease    Diabetes mellitus without complication ([place])    no meds; diet controlled   Diverticulitis    Hyperlipidemia    Phreesia 08/10/2020   Hypertension    Hypertriglyceridemia    Myocardial infarction ([place])    Phreesia 08/10/2020   PONV (postoperative nausea and vomiting)    Sciatica     Past Surgical History:  Procedure Laterality Date   ablasion of uterus     CARDIAC CATHETERIZATION     CARPAL TUNNEL RELEASE  11/10/2010   Procedure: CARPAL TUNNEL RELEASE;  Surgeon: Arther Abbott, MD;  Location: [facility];  Service: Orthopedics;  Laterality: Right;   CHOLECYSTECTOMY     [facility]   COLON RESECTION N/A 04/08/2015   Procedure: LAPAROSCOPIC HAND ASSISTED PARTIAL COLECTOMY  CONVERTED TO OPEN AT 3419;  Surgeon: Aviva Signs, MD;  Location: [facility];  Service: General;  Laterality: N/A;   COLONOSCOPY N/A 03/28/2014   Procedure: COLONOSCOPY;  Surgeon: Rogene Houston, MD;  Location[facility];  Service: Endoscopy;  Laterality: N/A;  730   CORONARY STENT INTERVENTION  04/07/2020   CORONARY STENT INTERVENTION N/A 04/07/2020   Procedure: CORONARY STENT INTERVENTION;  Surgeon: Jettie Booze, MD;  Location: [place] [facility];  Service: Cardiovascular;  Laterality: N/A;   JOINT REPLACEMENT N/A    Phreesia 08/10/2020   KNEE ARTHROSCOPY WITH DRILLING/MICROFRACTURE Left 10/20/2016   Procedure: LEFT KNEE ARTHROSCOPY WITH MICROFRACTURE;  Surgeon: Carole Civil, MD;  Location: [facility];  Service: Orthopedics;   Laterality: Left;   KNEE ARTHROSCOPY WITH DRILLING/MICROFRACTURE Left 04/27/2017   Procedure: KNEE ARTHROSCOPY WITH DRILLING/MICROFRACTURE;  Surgeon: Carole Civil, MD;  Location: [facility];  Service: Orthopedics;  Laterality: Left;   KNEE ARTHROSCOPY WITH OSTEOCHONDRAL DEFECT REPAIR Left 04/27/2017   Procedure: KNEE ARTHROSCOPY WITH OSTEOCHONDRAL DEFECT REPAIR Autograft;  Surgeon: Carole Civil, MD;  Location: [facility];  Service: Orthopedics;  Laterality: Left;   KNEE SURGERY     LEFT HEART CATH AND CORONARY ANGIOGRAPHY N/A 04/07/2020   Procedure: LEFT HEART CATH AND CORONARY ANGIOGRAPHY;  Surgeon: Jettie Booze, MD;  Location: [place] [facility];  Service: Cardiovascular;  Laterality: N/A;   PARTIAL COLECTOMY N/A 04/08/2015   Procedure: PARTIAL COLECTOMY;  Surgeon: Aviva Signs, MD;  Location: [facility];  Service: General;  Laterality: N/A;   right knee arthroscopy     SMALL INTESTINE SURGERY N/A    Phreesia 08/10/2020   TOTAL KNEE ARTHROPLASTY Left 10/17/2017   Procedure: TOTAL KNEE ARTHROPLASTY;  Surgeon: Carole Civil, MD;  Location: [facility];  Service: Orthopedics;  Laterality: Left;  [place]    TOTAL KNEE REVISION Left 10/20/2020   Procedure: REVISION TIBIAL COMPONENT LEFT TOTAL KNEE;  Surgeon: Aline Brochure,  Tim Lair, MD;  Location: [facility];  Service: Orthopedics;  Laterality: Left;   TUBAL LIGATION      Social History   Socioeconomic History   Marital status: Divorced    Spouse name: Not on file   Number of children: Not on file   Years of education: college   Highest education level: Not on file  Occupational History   Occupation: nurse    Employer: [employer]  Tobacco Use   Smoking status: Every Day    Packs/day: 1.50    Years: 26.00    Total pack years: 39.00    Types: Cigarettes    Last attempt to quit: 04/05/2020    Years since quitting: 1.6   Smokeless tobacco: Never  Vaping Use   Vaping Use: Never used  Substance and Sexual Activity   Alcohol use: No     Alcohol/week: 0.0 standard drinks of alcohol   Drug use: No   Sexual activity: Yes    Birth control/protection: Surgical  Other Topics Concern   Not on file  Social History Narrative   Not on file   Social Determinants of Health   Financial Resource Strain: Not on file  Food Insecurity: Not on file  Transportation Needs: Not on file  Physical Activity: Not on file  Stress: Not on file  Social Connections: Not on file    Family History  Problem Relation Age of Onset   Diabetes type I Mother    Hyperlipidemia Mother    Diabetes type II Father    Hypertension Father    Hyperlipidemia Father    Diabetes type II Brother    Diabetes type II Brother    Cancer Other    Diabetes Other    Arthritis Other    Asthma Other    Anesthesia problems Neg Hx     Outpatient Encounter Medications as of 11/22/2021  Medication Sig   Vitamin D, Ergocalciferol, (DRISDOL) 1.25 MG (50000 UNIT) CAPS capsule Take 1 capsule (50,000 Units total) by mouth every 7 (seven) days.   atorvastatin (LIPITOR) 40 MG tablet Take 1 tablet (40 mg total) by mouth daily.   carvedilol (COREG) 12.5 MG tablet Take 1 tablet by mouth twice daily   cholecalciferol (VITAMIN D) 25 MCG (1000 UNIT) tablet Take 1,000 Units by mouth daily.   clopidogrel (PLAVIX) 75 MG tablet Take 1 tablet (75 mg total) by mouth daily.   docusate sodium (COLACE) 100 MG capsule Take 1 capsule (100 mg total) by mouth 2 (two) times daily.   glipiZIDE (GLUCOTROL XL) 5 MG 24 hr tablet Take 1 tablet (5 mg total) by mouth daily with breakfast.   glucose blood (ONETOUCH VERIO) test strip Use as instructed to monitor blood glucose once daily.   Krill Oil (OMEGA-3) 500 MG CAPS Take 1,000 mg by mouth daily.   Lancets (ONETOUCH DELICA PLUS [id number]) MISC by Does not apply route.   losartan (COZAAR) 50 MG tablet Take 1 tablet by mouth once daily   metFORMIN (GLUCOPHAGE-XR) 500 MG 24 hr tablet Take 2 tablets (1,000 mg total) by mouth daily with  breakfast.   nitroGLYCERIN (NITROSTAT) 0.4 MG SL tablet PLACE 1 TABLET (0.4 MG TOTAL) UNDER THE TONGUE EVERY 5 (FIVE) MINUTES AS NEEDED FOR CHEST PAIN. (Patient taking differently: Place 0.4 mg under the tongue every 5 (five) minutes as needed for chest pain.)   oxyCODONE (OXY IR/ROXICODONE) 5 MG immediate release tablet 2 tabs every 4hrs as needed   promethazine (PHENERGAN) 12.5 MG tablet Take  1 tablet (12.5 mg total) by mouth every 4 (four) hours as needed for nausea or vomiting.   [DISCONTINUED] albuterol (VENTOLIN HFA) 108 (90 Base) MCG/ACT inhaler Inhale 1-2 puffs into the lungs every 6 (six) hours as needed for wheezing or shortness of breath.   [DISCONTINUED] aspirin EC 81 MG tablet Take 81 mg by mouth daily. Swallow whole.   [DISCONTINUED] gabapentin (NEURONTIN) 100 MG capsule 100 mg in the morning 100 mg in the afternoon and 300 mg at night   [DISCONTINUED] glipiZIDE (GLUCOTROL XL) 5 MG 24 hr tablet Take 1 tablet (5 mg total) by mouth daily with breakfast.   [DISCONTINUED] lidocaine (XYLOCAINE) 2 % solution Use as directed 10 mLs in the mouth or throat every 6 (six) hours as needed for mouth pain.   [DISCONTINUED] metFORMIN (GLUCOPHAGE-XR) 500 MG 24 hr tablet Take 2 tablets (1,000 mg total) by mouth daily with breakfast.   [DISCONTINUED] methocarbamol (ROBAXIN) 500 MG tablet TAKE 1 TABLET BY MOUTH EVERY 6 HOURS AS NEEDED FOR MUSCLE SPASM   [DISCONTINUED] pantoprazole (PROTONIX) 40 MG tablet Take 1 tablet (40 mg total) by mouth daily. Further refills by PCP (Patient not taking: Reported on 05/18/2020)   [DISCONTINUED] promethazine-dextromethorphan (PROMETHAZINE-DM) 6.25-15 MG/5ML syrup Take 5 mLs by mouth 4 (four) times daily as needed for cough.   No facility-administered encounter medications on file as of 11/22/2021.    ALLERGIES: Allergies  Allergen Reactions   Other    Flagyl [Metronidazole] Nausea Only   Hydrocodone Nausea And Vomiting and Other (See Comments)    Can take need to  take with medication for nausea and vomitting    VACCINATION STATUS: Immunization History  Administered Date(s) Administered   Moderna Sars-Covid-2 Vaccination 06/25/2019   Diabetes She presents for her follow-up diabetic visit. She has type 2 diabetes mellitus. Onset time: She was diagnosed at approx age of 63. Her disease course has been stable. There are no hypoglycemic associated symptoms. Associated symptoms include fatigue. Pertinent negatives for diabetes include no blurred vision, no polydipsia, no polyphagia, no polyuria and no visual change. There are no hypoglycemic complications. Symptoms are stable. Diabetic complications include heart disease. (Had MI recently) Risk factors for coronary artery disease include diabetes mellitus, dyslipidemia, hypertension, obesity, tobacco exposure, sedentary lifestyle and family history. Current diabetic treatment includes oral agent (dual therapy). She is compliant with treatment most of the time. Her weight is increasing rapidly. She is following a generally unhealthy diet. Meal planning includes avoidance of concentrated sweets. She has not had a previous visit with a dietitian. She rarely participates in exercise. Her breakfast blood glucose range is generally 140-180 mg/dl. Her bedtime blood glucose range is generally 180-200 mg/dl. (She presents today with her logs, no meter, showing slightly above target glycemic profile overall.  Her POCT A1c today is 8.1%, unchanged from previous visit.  She denies any hypoglycemia.  She says she knows she has not done well with her diet, has been eating more snacks recently.  She also notes chronic pain in bilateral knees has been worse lately as well.) An ACE inhibitor/angiotensin II receptor blocker is being taken. She does not see a podiatrist.Eye exam is current.  Hyperlipidemia This is a chronic problem. The current episode started more than 1 year ago. The problem is uncontrolled. Recent lipid tests were  reviewed and are variable. Exacerbating diseases include diabetes and obesity. Factors aggravating her hyperlipidemia include beta blockers. Current antihyperlipidemic treatment includes statins. The current treatment provides moderate improvement of lipids. Compliance problems include  adherence to diet and adherence to exercise.  Risk factors for coronary artery disease include diabetes mellitus, dyslipidemia, obesity, hypertension, a sedentary lifestyle and family history.  Hypertension This is a chronic problem. The current episode started more than 1 year ago. The problem has been resolved since onset. The problem is controlled. Pertinent negatives include no blurred vision. There are no associated agents to hypertension. Risk factors for coronary artery disease include diabetes mellitus, dyslipidemia, obesity, sedentary lifestyle, smoking/tobacco exposure and family history. Past treatments include beta blockers and angiotensin blockers. The current treatment provides mild improvement. There are no compliance problems.  Hypertensive end-organ damage includes CAD/MI.     Review of systems  Constitutional: + rapidly increasing body weight,  current Body mass index is 36.61 kg/m. , no fatigue, no subjective hyperthermia, no subjective hypothermia Eyes: no blurry vision, no xerophthalmia ENT: no sore throat, no nodules palpated in throat, no dysphagia/odynophagia, no hoarseness Cardiovascular: no chest pain, no shortness of breath, no palpitations, no leg swelling Respiratory: no cough, no shortness of breath Gastrointestinal: no nausea/vomiting/diarrhea Musculoskeletal: + bilateral knee pain- hx of TKR Skin: no rashes, no hyperemia Neurological: no tremors, no numbness, no tingling, no dizziness Psychiatric: no depression, no anxiety, increased stress   Objective:     BP 124/84   Pulse 66   Ht '5\' 5"'$  (1.651 m)   Wt 220 lb (99.8 kg)   BMI 36.61 kg/m   Wt Readings from Last 3 Encounters:   11/22/21 220 lb (99.8 kg)  09/14/21 208 lb (94.3 kg)  06/16/21 227 lb 6.4 oz (103.1 kg)    BP Readings from Last 3 Encounters:  11/22/21 124/84  09/14/21 (!) 144/89  06/16/21 140/70     Physical Exam- Limited  Constitutional:  Body mass index is 36.61 kg/m. , not in acute distress, normal state of mind Eyes:  EOMI, no exophthalmos Neck: Supple Cardiovascular: RRR, no murmurs, rubs, or gallops, no edema Respiratory: Adequate breathing efforts, no crackles, rales, rhonchi, or wheezing Musculoskeletal: no gross deformities, strength intact in all four extremities, no gross restriction of joint movements Skin:  no rashes, no hyperemia Neurological: no tremor with outstretched hands    CMP ( most recent) CMP     Component Value Date/Time   NA 137 11/11/2021 0825   K 4.5 11/11/2021 0825   CL 101 11/11/2021 0825   CO2 19 (L) 11/11/2021 0825   GLUCOSE 220 (H) 11/11/2021 0825   GLUCOSE 197 (H) 10/21/2020 0619   BUN 12 11/11/2021 0825   CREATININE 0.55 (L) 11/11/2021 0825   CALCIUM 9.4 11/11/2021 0825   PROT 6.6 11/11/2021 0825   ALBUMIN 4.3 11/11/2021 0825   AST 19 11/11/2021 0825   ALT 36 (H) 11/11/2021 0825   ALKPHOS 95 11/11/2021 0825   BILITOT 0.5 11/11/2021 0825   GFRNONAA >60 10/21/2020 0619   GFRAA >60 06/21/2019 2103     Diabetic Labs (most recent): Lab Results  Component Value Date   HGBA1C 8.1 (A) 04/29/2021   HGBA1C 6.6 (H) 10/16/2020   HGBA1C 6.3 08/05/2020   MICROALBUR 10 08/05/2020     Lipid Panel ( most recent) Lipid Panel     Component Value Date/Time   CHOL 118 11/11/2021 0825   TRIG 128 11/11/2021 0825   HDL 28 (L) 11/11/2021 0825   CHOLHDL 4.2 11/11/2021 0825   CHOLHDL 3.9 05/19/2020 0903   VLDL 25 05/19/2020 0903   LDLCALC 67 11/11/2021 0825   LABVLDL 23 11/11/2021 0825  Lab Results  Component Value Date   TSH 1.720 11/11/2021   TSH 0.905 05/19/2020   TSH 1.321 04/06/2020   TSH 1.152 06/21/2019   FREET4 1.34 11/11/2021    FREET4 0.92 05/19/2020      Assessment & Plan:   1) Type 2 diabetes mellitus with hyperglycemia, without long-term current use of insulin ([place])  She presents today with her logs, no meter, showing slightly above target glycemic profile overall.  Her POCT A1c today is 8.1%, unchanged from previous visit.  She denies any hypoglycemia.  She says she knows she has not done well with her diet, has been eating more snacks recently.  She also notes chronic pain in bilateral knees has been worse lately as well.  - [name] has currently uncontrolled symptomatic type 2 DM since 51 years of age.   -Recent labs reviewed.  - I had a long discussion with her about the progressive nature of diabetes and the pathology behind its complications. -her diabetes is complicated by CAD (recentMI) and she remains at a high risk for more acute and chronic complications which include CAD, CVA, CKD, retinopathy, and neuropathy. These are all discussed in detail with her.  - Nutritional counseling repeated at each appointment due to patients tendency to fall back in to old habits.  - The patient admits there is a room for improvement in their diet and drink choices. -  Suggestion is made for the patient to avoid simple carbohydrates from their diet including Cakes, Sweet Desserts / Pastries, Ice Cream, Soda (diet and regular), Sweet Tea, Candies, Chips, Cookies, Sweet Pastries, Store Bought Juices, Alcohol in Excess of 1-2 drinks a day, Artificial Sweeteners, Coffee Creamer, and "Sugar-free" Products. This will help patient to have stable blood glucose profile and potentially avoid unintended weight gain.   - I encouraged the patient to switch to unprocessed or minimally processed complex starch and increased protein intake (animal or plant source), fruits, and vegetables.   - Patient is advised to stick to a routine mealtimes to eat 3 meals a day and avoid unnecessary snacks (to snack only to correct  hypoglycemia).  - I have approached her with the following individualized plan to manage  her diabetes and patient agrees:   -She is advised to re-engage in healthy diet and increasing exercise as she is able.  She is advised to continue Metformin 1000 mg ER to be taken daily with breakfast and Glipizide 5 mg XL daily with breakfast.  I discussed potentially needing to add insulin vs Januvia if she cannot regain control and she is motivated to stay away from this.  -She is encouraged to continue monitoring blood glucose twice daily, before breakfast and before bed, and to call the clinic if she has readings less than 70 or greater than 200 for 3 tests in a row.  - Adjustment parameters are given to her for hypo and hyperglycemia in writing.  - she is not a candidate for incretin therapy due to heavy smoking history and increased risk for pancreatitis.  - Specific targets for  A1c;  LDL, HDL,  and Triglycerides were discussed with the patient.  2) Blood Pressure /Hypertension:  her blood pressure is controlled to target.   she is advised to continue her current medications including Coreg 12.5 mg po twice daily and Losartan 50 mg p.o. daily with breakfast.  3) Lipids/Hyperlipidemia:    Review of her recent lipid panel from 11/11/21 showed controlled LDL at 67-improved.  she is  advised to continue Lipitor 80 mg daily at bedtime and Omega 3 Krill oil caps po daily.  Side effects and precautions discussed with her.    4)  Weight/Diet:  her Body mass index is 36.61 kg/m.  -  clearly complicating her diabetes care.   she is a candidate for weight loss. I discussed with her the fact that loss of 5 - 10% of her  current body weight will have the most impact on her diabetes management.  Exercise, and detailed carbohydrates information provided  -  detailed on discharge instructions.  5) Vitamin D deficiency Her recent vitamin D level on 11/11/21 was 16.  She is not currently on any supplementation.  I  discussed and initiated Ergocalciferol 50000 units weekly x 24 weeks.  6) Chronic Care/Health Maintenance: -she is on ACEI/ARB and Statin medications and is encouraged to initiate and continue to follow up with Ophthalmology, Dentist, Podiatrist at least yearly or according to recommendations, and advised to [place]. I have recommended yearly flu vaccine and pneumonia vaccine at least every 5 years; moderate intensity exercise for up to 150 minutes weekly; and sleep for at least 7 hours a day.  - she is advised to maintain close follow up with Jani Gravel, MD for primary care needs, as well as her other providers for optimal and coordinated care.     I spent 42 minutes in the care of the patient today including review of labs from [facility], Lipids, Thyroid Function, Hematology (current and previous including abstractions from other facilities); face-to-face time discussing  her blood glucose readings/logs, discussing hypoglycemia and hyperglycemia episodes and symptoms, medications doses, her options of short and long term treatment based on the latest standards of care / guidelines;  discussion about incorporating lifestyle medicine;  and documenting the encounter. Risk reduction counseling performed per USPSTF guidelines to reduce obesity and cardiovascular risk factors.     Please refer to Patient Instructions for Blood Glucose Monitoring and Insulin/Medications Dosing Guide"  in media tab for additional information. Please  also refer to " Patient Self Inventory" in the Media  tab for reviewed elements of pertinent patient history.  [name] participated in the discussions, expressed understanding, and voiced agreement with the above plans.  All questions were answered to her satisfaction. she is encouraged to contact clinic should she have any questions or concerns prior to her return visit.   Follow up plan: - Return in about 3 months (around 02/22/2022) for Diabetes F/U with A1c in  office, No previsit labs, Bring meter and logs.  Rayetta Pigg, [facility] [address] [place], [place] [address] Phone: [phone] Fax: [phone]  11/22/2021, 3:44 PM

## 2021-11-26 ENCOUNTER — Other Ambulatory Visit (HOSPITAL_COMMUNITY): Payer: 59

## 2021-11-29 ENCOUNTER — Other Ambulatory Visit: Payer: Self-pay | Admitting: Orthopedic Surgery

## 2021-11-29 ENCOUNTER — Ambulatory Visit: Payer: 59 | Admitting: Orthopedic Surgery

## 2021-11-29 ENCOUNTER — Encounter: Payer: Self-pay | Admitting: Orthopedic Surgery

## 2021-11-29 DIAGNOSIS — Z96659 Presence of unspecified artificial knee joint: Secondary | ICD-10-CM

## 2021-11-29 DIAGNOSIS — Z96652 Presence of left artificial knee joint: Secondary | ICD-10-CM

## 2021-11-29 DIAGNOSIS — G8929 Other chronic pain: Secondary | ICD-10-CM

## 2021-11-29 MED ORDER — OXYCODONE HCL 5 MG PO TABS: ORAL_TABLET | ORAL | 0 refills | Status: DC

## 2021-11-30 ENCOUNTER — Ambulatory Visit
Admission: RE | Admit: 2021-11-30 | Discharge: 2021-11-30 | Disposition: A | Discharge status: Home / Self Care | Payer: 59 | Source: Ambulatory Visit | Attending: Orthopedic Surgery | Admitting: Orthopedic Surgery

## 2021-11-30 DIAGNOSIS — M5416 Radiculopathy, lumbar region: Secondary | ICD-10-CM | POA: Diagnosis not present

## 2021-11-30 DIAGNOSIS — M79605 Pain in left leg: Secondary | ICD-10-CM

## 2021-11-30 DIAGNOSIS — M4306 Spondylolysis, lumbar region: Secondary | ICD-10-CM

## 2021-11-30 DIAGNOSIS — M545 Low back pain, unspecified: Secondary | ICD-10-CM | POA: Diagnosis not present

## 2021-12-02 ENCOUNTER — Encounter: Payer: Self-pay | Admitting: Orthopedic Surgery

## 2021-12-06 ENCOUNTER — Ambulatory Visit (INDEPENDENT_AMBULATORY_CARE_PROVIDER_SITE_OTHER): Payer: 59 | Admitting: Orthopedic Surgery

## 2021-12-06 ENCOUNTER — Encounter: Payer: Self-pay | Admitting: Orthopedic Surgery

## 2021-12-06 DIAGNOSIS — M25562 Pain in left knee: Secondary | ICD-10-CM

## 2021-12-06 DIAGNOSIS — Z96652 Presence of left artificial knee joint: Secondary | ICD-10-CM | POA: Diagnosis not present

## 2021-12-06 DIAGNOSIS — G8929 Other chronic pain: Secondary | ICD-10-CM | POA: Diagnosis not present

## 2021-12-06 NOTE — Progress Notes (Signed)
Chief Complaint  Patient presents with   Results    Review MRI     Reevaluation of the left total knee revision surgery.  Patient still having significant pain in her left leg and knee  MRI did not show any significant lumbar spine disease  Patient still having severe left knee pain  Review  Bone scan was normal lab results have been negative x-rays of look good  Referral for second opinion regarding left total knee continue current pain management with oxycodone

## 2021-12-08 ENCOUNTER — Encounter: Payer: Self-pay | Admitting: Orthopedic Surgery

## 2021-12-08 ENCOUNTER — Other Ambulatory Visit: Payer: Self-pay | Admitting: Orthopedic Surgery

## 2021-12-08 DIAGNOSIS — T84038D Mechanical loosening of other internal prosthetic joint, subsequent encounter: Secondary | ICD-10-CM

## 2021-12-08 DIAGNOSIS — G8929 Other chronic pain: Secondary | ICD-10-CM

## 2021-12-08 DIAGNOSIS — Z96652 Presence of left artificial knee joint: Secondary | ICD-10-CM

## 2021-12-09 ENCOUNTER — Other Ambulatory Visit: Payer: Self-pay

## 2021-12-09 MED ORDER — ATORVASTATIN CALCIUM 40 MG PO TABS: 40.0000 mg | ORAL_TABLET | Freq: Every day | ORAL | 1 refills | Status: DC

## 2021-12-09 MED ORDER — OXYCODONE HCL 5 MG PO TABS: ORAL_TABLET | ORAL | 0 refills | Status: DC

## 2021-12-14 DIAGNOSIS — M25462 Effusion, left knee: Secondary | ICD-10-CM | POA: Diagnosis not present

## 2021-12-14 DIAGNOSIS — M25562 Pain in left knee: Secondary | ICD-10-CM | POA: Diagnosis not present

## 2021-12-14 DIAGNOSIS — Z96652 Presence of left artificial knee joint: Secondary | ICD-10-CM | POA: Diagnosis not present

## 2021-12-21 ENCOUNTER — Encounter: Payer: Self-pay | Admitting: Orthopedic Surgery

## 2021-12-21 DIAGNOSIS — Z09 Encounter for follow-up examination after completed treatment for conditions other than malignant neoplasm: Secondary | ICD-10-CM | POA: Diagnosis not present

## 2021-12-21 DIAGNOSIS — Z96652 Presence of left artificial knee joint: Secondary | ICD-10-CM

## 2021-12-21 DIAGNOSIS — T84038D Mechanical loosening of other internal prosthetic joint, subsequent encounter: Secondary | ICD-10-CM

## 2021-12-21 DIAGNOSIS — Z96659 Presence of unspecified artificial knee joint: Secondary | ICD-10-CM

## 2021-12-21 DIAGNOSIS — G8929 Other chronic pain: Secondary | ICD-10-CM

## 2021-12-22 ENCOUNTER — Ambulatory Visit: Payer: 59 | Admitting: Cardiology

## 2021-12-22 MED ORDER — OXYCODONE HCL 5 MG PO TABS: ORAL_TABLET | ORAL | 0 refills | Status: DC

## 2021-12-29 ENCOUNTER — Encounter: Payer: Self-pay | Admitting: Orthopedic Surgery

## 2022-01-04 ENCOUNTER — Encounter: Payer: Self-pay | Admitting: Orthopedic Surgery

## 2022-01-04 ENCOUNTER — Ambulatory Visit: Payer: 59 | Admitting: Podiatry

## 2022-01-04 ENCOUNTER — Other Ambulatory Visit: Payer: Self-pay

## 2022-01-04 DIAGNOSIS — T84038D Mechanical loosening of other internal prosthetic joint, subsequent encounter: Secondary | ICD-10-CM

## 2022-01-04 DIAGNOSIS — Z96659 Presence of unspecified artificial knee joint: Secondary | ICD-10-CM

## 2022-01-04 DIAGNOSIS — G8929 Other chronic pain: Secondary | ICD-10-CM

## 2022-01-04 DIAGNOSIS — Z96652 Presence of left artificial knee joint: Secondary | ICD-10-CM

## 2022-01-04 MED ORDER — OXYCODONE HCL 5 MG PO TABS: ORAL_TABLET | ORAL | 0 refills | Status: DC

## 2022-01-04 NOTE — Telephone Encounter (Signed)
Request sent to provider.

## 2022-01-07 ENCOUNTER — Ambulatory Visit (INDEPENDENT_AMBULATORY_CARE_PROVIDER_SITE_OTHER): Payer: 59 | Admitting: Podiatry

## 2022-01-07 ENCOUNTER — Encounter: Payer: Self-pay | Admitting: Podiatry

## 2022-01-07 DIAGNOSIS — L600 Ingrowing nail: Secondary | ICD-10-CM

## 2022-01-07 NOTE — Patient Instructions (Signed)

## 2022-01-10 NOTE — Progress Notes (Signed)
Subjective:   Patient ID: [name], female   DOB: 51 y.o.   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  Patient wants surgery and signed consent form scheduled for procedure and had each big toe anesthetized 60 mg like Marcaine mixture sterile prep done using sterile instrumentation remove the hallux nails exposed matrix applied phenol 5 applications 30 seconds followed by alcohol lavage sterile dressing gave instructions on soaks leave dressings on 24 hours take them off earlier if throbbing were to occur and encouraged her to call questions concerns

## 2022-01-12 ENCOUNTER — Encounter: Payer: Self-pay | Admitting: Orthopedic Surgery

## 2022-01-12 DIAGNOSIS — Z96652 Presence of left artificial knee joint: Secondary | ICD-10-CM

## 2022-01-12 DIAGNOSIS — G8929 Other chronic pain: Secondary | ICD-10-CM

## 2022-01-12 DIAGNOSIS — T84038D Mechanical loosening of other internal prosthetic joint, subsequent encounter: Secondary | ICD-10-CM

## 2022-01-13 MED ORDER — OXYCODONE HCL 5 MG PO TABS: ORAL_TABLET | ORAL | 0 refills | Status: DC

## 2022-01-19 ENCOUNTER — Encounter: Payer: Self-pay | Admitting: Nurse Practitioner

## 2022-01-19 DIAGNOSIS — E1165 Type 2 diabetes mellitus with hyperglycemia: Secondary | ICD-10-CM

## 2022-01-19 MED ORDER — METFORMIN HCL ER 500 MG PO TB24: 1000.0000 mg | ORAL_TABLET | Freq: Every day | ORAL | 3 refills | Status: DC

## 2022-01-20 ENCOUNTER — Encounter: Payer: Self-pay | Admitting: Orthopedic Surgery

## 2022-01-20 DIAGNOSIS — Z96659 Presence of unspecified artificial knee joint: Secondary | ICD-10-CM

## 2022-01-20 DIAGNOSIS — Z96652 Presence of left artificial knee joint: Secondary | ICD-10-CM

## 2022-01-20 DIAGNOSIS — G8929 Other chronic pain: Secondary | ICD-10-CM

## 2022-01-20 MED ORDER — OXYCODONE HCL 5 MG PO TABS: ORAL_TABLET | ORAL | 0 refills | Status: DC

## 2022-01-23 ENCOUNTER — Other Ambulatory Visit: Payer: Self-pay | Admitting: Cardiology

## 2022-01-27 ENCOUNTER — Encounter: Payer: Self-pay | Admitting: Orthopedic Surgery

## 2022-01-27 ENCOUNTER — Other Ambulatory Visit: Payer: Self-pay | Admitting: Radiology

## 2022-01-27 DIAGNOSIS — T84038D Mechanical loosening of other internal prosthetic joint, subsequent encounter: Secondary | ICD-10-CM

## 2022-01-27 DIAGNOSIS — Z96652 Presence of left artificial knee joint: Secondary | ICD-10-CM

## 2022-01-27 DIAGNOSIS — G8929 Other chronic pain: Secondary | ICD-10-CM

## 2022-01-27 MED ORDER — OXYCODONE HCL 5 MG PO TABS: ORAL_TABLET | ORAL | 0 refills | Status: DC

## 2022-01-27 NOTE — Telephone Encounter (Signed)
MyChart message- I need my Oxycodone '5mg'$  2 tabs po Q 4 hours PRN refilled.  Thanks.

## 2022-02-03 ENCOUNTER — Other Ambulatory Visit: Payer: Self-pay

## 2022-02-03 ENCOUNTER — Encounter: Payer: Self-pay | Admitting: Orthopedic Surgery

## 2022-02-03 DIAGNOSIS — G8929 Other chronic pain: Secondary | ICD-10-CM

## 2022-02-03 DIAGNOSIS — Z96659 Presence of unspecified artificial knee joint: Secondary | ICD-10-CM

## 2022-02-03 DIAGNOSIS — Z96652 Presence of left artificial knee joint: Secondary | ICD-10-CM

## 2022-02-03 MED ORDER — OXYCODONE HCL 5 MG PO TABS: ORAL_TABLET | ORAL | 0 refills | Status: DC

## 2022-02-07 ENCOUNTER — Ambulatory Visit: Payer: 59 | Attending: Cardiology | Admitting: Medical

## 2022-02-07 ENCOUNTER — Encounter: Payer: Self-pay | Admitting: Medical

## 2022-02-07 ENCOUNTER — Other Ambulatory Visit (HOSPITAL_COMMUNITY)
Admission: RE | Admit: 2022-02-07 | Discharge: 2022-02-07 | Disposition: A | Discharge status: Home / Self Care | Payer: 59 | Source: Ambulatory Visit | Attending: Medical | Admitting: Medical

## 2022-02-07 VITALS — BP 122/78 | HR 82 | Ht 65.0 in | Wt 214.4 lb

## 2022-02-07 DIAGNOSIS — E782 Mixed hyperlipidemia: Secondary | ICD-10-CM | POA: Diagnosis not present

## 2022-02-07 DIAGNOSIS — I10 Essential (primary) hypertension: Secondary | ICD-10-CM | POA: Diagnosis not present

## 2022-02-07 DIAGNOSIS — E119 Type 2 diabetes mellitus without complications: Secondary | ICD-10-CM

## 2022-02-07 DIAGNOSIS — R600 Localized edema: Secondary | ICD-10-CM | POA: Diagnosis not present

## 2022-02-07 DIAGNOSIS — I25118 Atherosclerotic heart disease of native coronary artery with other forms of angina pectoris: Secondary | ICD-10-CM | POA: Insufficient documentation

## 2022-02-07 LAB — CBC
HCT: 42.7 % (ref 36.0–46.0)
Hemoglobin: 14.1 g/dL (ref 12.0–15.0)
MCH: 29.7 pg (ref 26.0–34.0)
MCHC: 33 g/dL (ref 30.0–36.0)
MCV: 90.1 fL (ref 80.0–100.0)
Platelets: 334 10*3/uL (ref 150–400)
RBC: 4.74 MIL/uL (ref 3.87–5.11)
RDW: 13.6 % (ref 11.5–15.5)
WBC: 12.9 10*3/uL — ABNORMAL HIGH (ref 4.0–10.5)
nRBC: 0 % (ref 0.0–0.2)

## 2022-02-07 NOTE — Patient Instructions (Signed)
Medication Instructions:  Your physician recommends that you continue on your current medications as directed. Please refer to the Current Medication list given to you today.  *If you need a refill on your cardiac medications before your next appointment, please call your pharmacy*   Lab Work: Your physician recommends that you return for lab work in: Today ( CBC)   If you have labs (blood work) drawn today and your tests are completely normal, you will receive your results only by: MyChart Message (if you have MyChart) OR A paper copy in the mail If you have any lab test that is abnormal or we need to change your treatment, we will call you to review the results.   Testing/Procedures: Your physician has requested that you have an echocardiogram. Echocardiography is a painless test that uses sound waves to create images of your heart. It provides your doctor with information about the size and shape of your heart and how well your heart's chambers and valves are working. This procedure takes approximately one hour. There are no restrictions for this procedure. Please do NOT wear cologne, perfume, aftershave, or lotions (deodorant is allowed). Please arrive 15 minutes prior to your appointment time.  Follow-Up: At [facility], you and your health needs are our priority.  As part of our continuing mission to provide you with exceptional heart care, we have created designated Provider Care Teams.  These Care Teams include your primary Cardiologist (physician) and Advanced Practice Providers (APPs -  Physician Assistants and Nurse Practitioners) who all work together to provide you with the care you need, when you need it.  We recommend signing up for the patient portal called "MyChart".  Sign up information is provided on this After Visit Summary.  MyChart is used to connect with patients for Virtual Visits (Telemedicine).  Patients are able to view lab/test results, encounter notes,  upcoming appointments, etc.  Non-urgent messages can be sent to your provider as well.   To learn more about what you can do with MyChart, go to [email or web address].    Your next appointment:   1 month(s)  The format for your next appointment:   In Person  Provider:   You may see Carlyle Dolly, MD or one of the following Advanced Practice Providers on your designated Care Team:   Bernerd Pho, PA-C  Ermalinda Barrios, [place]     Other Instructions Thank you for choosing [facility]!    Important Information About Sugar

## 2022-02-07 NOTE — Progress Notes (Signed)
Cardiology Office Note:    Date:  02/07/2022   ID:  [name], DOB November 16, 1970, MRN [id number]  PCP:  No primary care provider on file.  [facility] Cardiologist:  Carlyle Dolly, MD  [facility] Electrophysiologist:  None   Referring MD: Jani Gravel, MD   Chief Complaint: 6 month follow-up  History of Present Illness:    [name] is a 51 y.o. female with a hx of CAD with prior stenting, HLD, HTN, DM2 who presents for 6 month follow-up.  H/o CAD with NSTEMI. Cath 03/2020 showed mild diffused LAD disease, LCX mid 99% and distal 50%, RCA prox 70%. Treated with DES to Lcx, DES to RCA. Recommended plavix as monotherapy after 1 year due to diffuse disease. Echo 03/2020 showed LVEF 65-70% with no WMA. H/o myalgia on Atorvastatin.   Last seen 06/16/21 and was overall doing well.  Today, the patient is doing well from a cardiac perspective. She has chronic pain in her leg for which she takes narcotics. 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 She occasionally has fleeting chest tightness, not worse with exertion. She denies SOB. She reports new lower leg edema that she noticed about a week ago. She reports it's worse at night and resolves by morning. She does not follow low salt diet. She denies palpitations, lightheadedness, or dizziness. She does not need refills at this time.   Past Medical History:  Diagnosis Date   Arthritis    Carpal tunnel syndrome, bilateral    Coronary artery disease    Diabetes mellitus without complication ([place])    no meds; diet controlled   Diverticulitis    Hyperlipidemia    Phreesia 08/10/2020   Hypertension    Hypertriglyceridemia    Myocardial infarction ([place])    Phreesia 08/10/2020   PONV (postoperative nausea and vomiting)    Sciatica     Past Surgical History:  Procedure Laterality Date   ablasion of uterus     CARDIAC CATHETERIZATION     CARPAL TUNNEL RELEASE  11/10/2010   Procedure: CARPAL TUNNEL  RELEASE;  Surgeon: Arther Abbott, MD;  Location: [facility];  Service: Orthopedics;  Laterality: Right;   CHOLECYSTECTOMY     [facility]   COLON RESECTION N/A 04/08/2015   Procedure: LAPAROSCOPIC HAND ASSISTED PARTIAL COLECTOMY  CONVERTED TO OPEN AT 2094;  Surgeon: Aviva Signs, MD;  Location: [facility];  Service: General;  Laterality: N/A;   COLONOSCOPY N/A 03/28/2014   Procedure: COLONOSCOPY;  Surgeon: Rogene Houston, MD;  Location: [facility];  Service: Endoscopy;  Laterality: N/A;  730   CORONARY STENT INTERVENTION  04/07/2020   CORONARY STENT INTERVENTION N/A 04/07/2020   Procedure: CORONARY STENT INTERVENTION;  Surgeon: Jettie Booze, MD;  Location: [place] [facility];  Service: Cardiovascular;  Laterality: N/A;   JOINT REPLACEMENT N/A    Phreesia 08/10/2020   KNEE ARTHROSCOPY WITH DRILLING/MICROFRACTURE Left 10/20/2016   Procedure: LEFT KNEE ARTHROSCOPY WITH MICROFRACTURE;  Surgeon: Carole Civil, MD;  Location: [facility];  Service: Orthopedics;  Laterality: Left;   KNEE ARTHROSCOPY WITH DRILLING/MICROFRACTURE Left 04/27/2017   Procedure: KNEE ARTHROSCOPY WITH DRILLING/MICROFRACTURE;  Surgeon: Carole Civil, MD;  Location: [facility];  Service: Orthopedics;  Laterality: Left;   KNEE ARTHROSCOPY WITH OSTEOCHONDRAL DEFECT REPAIR Left 04/27/2017   Procedure: KNEE ARTHROSCOPY WITH OSTEOCHONDRAL DEFECT REPAIR Autograft;  Surgeon: Carole Civil, MD;  Location: [facility];  Service: Orthopedics;  Laterality: Left;   KNEE SURGERY  LEFT HEART CATH AND CORONARY ANGIOGRAPHY N/A 04/07/2020   Procedure: LEFT HEART CATH AND CORONARY ANGIOGRAPHY;  Surgeon: Jettie Booze, MD;  Location: [place] CV LAB;  Service: Cardiovascular;  Laterality: N/A;   PARTIAL COLECTOMY N/A 04/08/2015   Procedure: PARTIAL COLECTOMY;  Surgeon: Aviva Signs, MD;  Location: [facility];  Service: General;  Laterality: N/A;   right knee arthroscopy     SMALL INTESTINE SURGERY N/A    Phreesia 08/10/2020    TOTAL KNEE ARTHROPLASTY Left 10/17/2017   Procedure: TOTAL KNEE ARTHROPLASTY;  Surgeon: Carole Civil, MD;  Location: [facility];  Service: Orthopedics;  Laterality: Left;  [place]    TOTAL KNEE REVISION Left 10/20/2020   Procedure: REVISION TIBIAL COMPONENT LEFT TOTAL KNEE;  Surgeon: Carole Civil, MD;  Location: [facility];  Service: Orthopedics;  Laterality: Left;   TUBAL LIGATION      Current Medications: Current Meds  Medication Sig   atorvastatin (LIPITOR) 40 MG tablet Take 1 tablet (40 mg total) by mouth daily.   carvedilol (COREG) 12.5 MG tablet Take 1 tablet by mouth twice daily   clopidogrel (PLAVIX) 75 MG tablet Take 1 tablet (75 mg total) by mouth daily.   docusate sodium (COLACE) 100 MG capsule Take 1 capsule (100 mg total) by mouth 2 (two) times daily.   glipiZIDE (GLUCOTROL XL) 5 MG 24 hr tablet Take 1 tablet (5 mg total) by mouth daily with breakfast.   glucose blood (ONETOUCH VERIO) test strip Use as instructed to monitor blood glucose once daily.   Krill Oil (OMEGA-3) 500 MG CAPS Take 1,000 mg by mouth daily.   Lancets (ONETOUCH DELICA PLUS [id number]) MISC by Does not apply route.   losartan (COZAAR) 50 MG tablet Take 1 tablet by mouth once daily   metFORMIN (GLUCOPHAGE-XR) 500 MG 24 hr tablet Take 2 tablets (1,000 mg total) by mouth daily with breakfast.   nitroGLYCERIN (NITROSTAT) 0.4 MG SL tablet PLACE 1 TABLET (0.4 MG TOTAL) UNDER THE TONGUE EVERY 5 (FIVE) MINUTES AS NEEDED FOR CHEST PAIN. (Patient taking differently: Place 0.4 mg under the tongue every 5 (five) minutes as needed for chest pain.)   oxyCODONE (OXY IR/ROXICODONE) 5 MG immediate release tablet 2 tabs every 4hrs as needed   promethazine (PHENERGAN) 12.5 MG tablet TAKE 1 TABLET BY MOUTH EVERY 4 HOURS AS NEEDED FOR NAUSEA FOR VOMITING   Vitamin D, Ergocalciferol, (DRISDOL) 1.25 MG (50000 UNIT) CAPS capsule Take 1 capsule (50,000 Units total) by mouth every 7 (seven) days.     Allergies:   Other, Flagyl  [metronidazole], and Hydrocodone   Social History   Socioeconomic History   Marital status: Divorced    Spouse name: Not on file   Number of children: Not on file   Years of education: college   Highest education level: Not on file  Occupational History   Occupation: nurse    Employer: [employer]  Tobacco Use   Smoking status: Every Day    Packs/day: 1.00    Years: 26.00    Total pack years: 26.00    Types: Cigarettes    Last attempt to quit: 04/05/2020    Years since quitting: 1.8   Smokeless tobacco: Never  Vaping Use   Vaping Use: Never used  Substance and Sexual Activity   Alcohol use: No    Alcohol/week: 0.0 standard drinks of alcohol   Drug use: No   Sexual activity: Yes    Birth control/protection: Surgical  Other Topics Concern  Not on file  Social History Narrative   Not on file   Social Determinants of Health   Financial Resource Strain: Not on file  Food Insecurity: Not on file  Transportation Needs: Not on file  Physical Activity: Not on file  Stress: Not on file  Social Connections: Not on file     Family History: The patient's family history includes Arthritis in an other family member; Asthma in an other family member; Cancer in an other family member; Diabetes in an other family member; Diabetes type I in her mother; Diabetes type II in her brother, brother, and father; Hyperlipidemia in her father and mother; Hypertension in her father. There is no history of Anesthesia problems.  ROS:   Please see the history of present illness.     All other systems reviewed and are negative.  EKGs/Labs/Other Studies Reviewed:    The following studies were reviewed today:  LHC 03/2020 Dist Cx lesion is 50% stenosed. Mid Cx lesion is 99% stenosed. This was the culprit lesion for presentation. A drug-eluting stent was successfully placed using a STENT RESOLUTE ONYX 3.0X22, postdilated to 3.75 mm, and optimized by IVUS Post intervention, there is a 0%  residual stenosis. Prox RCA lesion is 70% stenosed. Cross sectional area 3.9 mm2 by IVUS. A drug-eluting stent was successfully placed using a STENT RESOLUTE ONYX 3.5X12, postdilated to > 4 mm and optimized by IVUS. RPDA lesion is 50% stenosed. Post intervention, there is a 0% residual stenosis. The left ventricular systolic function is normal. LV end diastolic pressure is mildly elevated. The left ventricular ejection fraction is 55-65% by visual estimate. There is no aortic valve stenosis.   Consider clopidogrel monotherapy after 12 months of DAPT given her diffuse distal vessel disease.    Continue aggressive secondary prevention.    Echo 03/2020  1. Left ventricular ejection fraction, by estimation, is 65 to 70%. The  left ventricle has normal function. The left ventricle has no regional  wall motion abnormalities. There is mild left ventricular hypertrophy.  Left ventricular diastolic parameters  were normal.   2. Right ventricular systolic function is normal. The right ventricular  size is normal.   3. Left atrial size was mildly dilated.   4. The mitral valve is normal in structure. No evidence of mitral valve  regurgitation. No evidence of mitral stenosis.   5. The aortic valve is tricuspid. Aortic valve regurgitation is not  visualized. No aortic stenosis is present.   6. The inferior vena cava is normal in size with greater than 50%  respiratory variability, suggesting right atrial pressure of 3 mmHg.     EKG:  EKG is ordered today.  The ekg ordered today demonstrates NSR 71bpm, nonspecific ST changes  Recent Labs: 11/11/2021: ALT 36; BUN 12; Creatinine, Ser 0.55; Potassium 4.5; Sodium 137; TSH 1.720  Recent Lipid Panel    Component Value Date/Time   CHOL 118 11/11/2021 0825   TRIG 128 11/11/2021 0825   HDL 28 (L) 11/11/2021 0825   CHOLHDL 4.2 11/11/2021 0825   CHOLHDL 3.9 05/19/2020 0903   VLDL 25 05/19/2020 0903   LDLCALC 67 11/11/2021 0825    Physical Exam:     VS:  BP 122/78   Pulse 82   Ht '5\' 5"'$  (1.651 m)   Wt 214 lb 6.4 oz (97.3 kg)   SpO2 96%   BMI 35.68 kg/m     Wt Readings from Last 3 Encounters:  02/07/22 214 lb 6.4 oz (97.3 kg)  11/22/21 220 lb (99.8 kg)  09/14/21 208 lb (94.3 kg)     GEN:  Well nourished, well developed in no acute distress HEENT: Normal NECK: No JVD; No carotid bruits LYMPHATICS: No lymphadenopathy CARDIAC: RRR, no murmurs, rubs, gallops RESPIRATORY:  Clear to auscultation without rales, wheezing or rhonchi  ABDOMEN: Soft, non-tender, non-distended MUSCULOSKELETAL:  No edema; No deformity  SKIN: Warm and dry NEUROLOGIC:  Alert and oriented x 3 PSYCHIATRIC:  Normal affect   ASSESSMENT:    1. Coronary artery disease involving native coronary artery of native heart with other form of angina pectoris ([place])   2. Lower extremity edema   3. Hyperlipidemia, mixed   4. Essential hypertension   5. Type 2 diabetes mellitus without complication, without long-term current use of insulin (H[facility])    PLAN:    In order of problems listed above:  Chest tightness CAD with DES Lcx and RCA 2021 Patient describes rare chest tightness that is fleeting, mostly atypical. EKG with no significant changes. We discussed a stress test, but we will plan to watch and wait at this time. Continue Plavix, I will check CBC. Continue SL NTG, Lipitor and Coreg.   Lower leg edema Patient reports dependent lower leg edema that started about a week ago. No swelling on exam today. I will update an echocardiogram. We discussed low salt, leg elevation and compression socks.   HLD LDL 67 10/2021. Continue Lipitor '40mg'$  daily.   HTN BP is good today, continue Coreg and Losartan.   DM2 Most recent A1C 8.1. Followed by PCP.   Disposition: Follow up in 1-2 month(s) with MD/APP     Signed, Timmie Dugue Ninfa Meeker, PA-C  02/07/2022 2:25 PM    [facility]

## 2022-02-09 ENCOUNTER — Encounter: Payer: Self-pay | Admitting: Podiatry

## 2022-02-09 ENCOUNTER — Ambulatory Visit (INDEPENDENT_AMBULATORY_CARE_PROVIDER_SITE_OTHER): Payer: 59 | Admitting: Podiatry

## 2022-02-09 DIAGNOSIS — B070 Plantar wart: Secondary | ICD-10-CM | POA: Diagnosis not present

## 2022-02-10 ENCOUNTER — Other Ambulatory Visit: Payer: Self-pay

## 2022-02-10 ENCOUNTER — Encounter: Payer: Self-pay | Admitting: Orthopedic Surgery

## 2022-02-10 ENCOUNTER — Other Ambulatory Visit: Payer: Self-pay | Admitting: Cardiology

## 2022-02-10 DIAGNOSIS — T84038D Mechanical loosening of other internal prosthetic joint, subsequent encounter: Secondary | ICD-10-CM

## 2022-02-10 DIAGNOSIS — Z96652 Presence of left artificial knee joint: Secondary | ICD-10-CM

## 2022-02-10 DIAGNOSIS — G8929 Other chronic pain: Secondary | ICD-10-CM

## 2022-02-10 MED ORDER — OXYCODONE HCL 5 MG PO TABS: ORAL_TABLET | ORAL | 0 refills | Status: DC

## 2022-02-10 NOTE — Telephone Encounter (Signed)
Request sent to provider.

## 2022-02-10 NOTE — Progress Notes (Signed)
Subjective:   Patient ID: [name], female   DOB: 51 y.o.   MRN: [id number]   HPI Patient states overall doing pretty well with the nail still has slight drainage but overall satisfied and has a painful lesion on the plantar aspect left foot that popped up over the last several months   ROS      Objective:  Physical Exam  Neuro neurovascular status intact nail sites healing well crusting over with a keratotic lesion subfirst metatarsal left that upon debridement shows pinpoint bleeding pain to lateral pressure verruca plantar     Assessment:  Verruca plantar aspect left     Plan:  Debridement of lesion accomplished and I then went ahead applied chemical agent to create immune response with sterile dressing instructed on what to do if any blistering were to occur or recur if symptoms were to get worse again

## 2022-02-11 ENCOUNTER — Telehealth: Payer: Self-pay | Admitting: Cardiology

## 2022-02-11 NOTE — Telephone Encounter (Signed)
Patient returned CMA's call. 

## 2022-02-14 ENCOUNTER — Ambulatory Visit (HOSPITAL_COMMUNITY)
Admission: RE | Admit: 2022-02-14 | Discharge: 2022-02-14 | Disposition: A | Discharge status: Home / Self Care | Payer: 59 | Source: Ambulatory Visit | Attending: Medical | Admitting: Medical

## 2022-02-14 DIAGNOSIS — R600 Localized edema: Secondary | ICD-10-CM | POA: Insufficient documentation

## 2022-02-14 LAB — ECHOCARDIOGRAM COMPLETE
AR max vel: 2.4 cm2
AV Area VTI: 2.33 cm2
AV Area mean vel: 2.2 cm2
AV Mean grad: 9 mmHg
AV Peak grad: 16.3 mmHg
Ao pk vel: 2.02 m/s
Area-P 1/2: 2.34 cm2
S' Lateral: 2.6 cm

## 2022-02-14 NOTE — Telephone Encounter (Signed)
Cadence Ninfa Meeker, PA-C  02/08/2022  8:46 AM EDT     Cbc showed elevated white count, meaning possible infection. Hgb normal.

## 2022-02-14 NOTE — Telephone Encounter (Signed)
Left a message for patient to call office regarding results.

## 2022-02-14 NOTE — Progress Notes (Signed)
*  PRELIMINARY RESULTS* Echocardiogram 2D Echocardiogram has been performed.  [name] 02/14/2022, 4:01 PM

## 2022-02-16 NOTE — Telephone Encounter (Signed)
Mychart message sent to patient regarding lab work

## 2022-02-22 ENCOUNTER — Encounter: Payer: Self-pay | Admitting: Orthopedic Surgery

## 2022-02-22 DIAGNOSIS — Z96652 Presence of left artificial knee joint: Secondary | ICD-10-CM

## 2022-02-22 DIAGNOSIS — G8929 Other chronic pain: Secondary | ICD-10-CM

## 2022-02-22 DIAGNOSIS — Z96659 Presence of unspecified artificial knee joint: Secondary | ICD-10-CM

## 2022-02-22 MED ORDER — OXYCODONE HCL 5 MG PO TABS: ORAL_TABLET | ORAL | 0 refills | Status: DC

## 2022-02-25 ENCOUNTER — Ambulatory Visit: Payer: 59 | Admitting: Nurse Practitioner

## 2022-02-28 ENCOUNTER — Ambulatory Visit (INDEPENDENT_AMBULATORY_CARE_PROVIDER_SITE_OTHER): Payer: 59 | Admitting: Nurse Practitioner

## 2022-02-28 ENCOUNTER — Encounter: Payer: Self-pay | Admitting: Nurse Practitioner

## 2022-02-28 VITALS — BP 119/75 | HR 72 | Ht 65.0 in | Wt 211.2 lb

## 2022-02-28 DIAGNOSIS — E782 Mixed hyperlipidemia: Secondary | ICD-10-CM | POA: Diagnosis not present

## 2022-02-28 DIAGNOSIS — I10 Essential (primary) hypertension: Secondary | ICD-10-CM

## 2022-02-28 DIAGNOSIS — E1165 Type 2 diabetes mellitus with hyperglycemia: Secondary | ICD-10-CM | POA: Diagnosis not present

## 2022-02-28 LAB — POCT GLYCOSYLATED HEMOGLOBIN (HGB A1C): Hemoglobin A1C: 7 % — AB (ref 4.0–5.6)

## 2022-02-28 LAB — POCT UA - MICROALBUMIN
Albumin/Creatinine Ratio, Urine, POC: <30
Creatinine, POC: 100 mg/dL
Microalbumin Ur, POC: 30 mg/L

## 2022-02-28 NOTE — Progress Notes (Signed)
Endocrinology Follow Up Note       02/28/2022, 1:46 PM   Subjective:    Patient ID: [name], female    DOB: 1971/03/12.  [name] is being seen in follow up after being seen in consultation for management of currently uncontrolled symptomatic diabetes requested by  Pcp, No.   Past Medical History:  Diagnosis Date   Arthritis    Carpal tunnel syndrome, bilateral    Coronary artery disease    Diabetes mellitus without complication ([place])    no meds; diet controlled   Diverticulitis    Hyperlipidemia    Phreesia 08/10/2020   Hypertension    Hypertriglyceridemia    Myocardial infarction ([place])    Phreesia 08/10/2020   PONV (postoperative nausea and vomiting)    Sciatica     Past Surgical History:  Procedure Laterality Date   ablasion of uterus     CARDIAC CATHETERIZATION     CARPAL TUNNEL RELEASE  11/10/2010   Procedure: CARPAL TUNNEL RELEASE;  Surgeon: Arther Abbott, MD;  Location: [facility];  Service: Orthopedics;  Laterality: Right;   CHOLECYSTECTOMY     [facility]   COLON RESECTION N/A 04/08/2015   Procedure: LAPAROSCOPIC HAND ASSISTED PARTIAL COLECTOMY  CONVERTED TO OPEN AT 0350;  Surgeon: Aviva Signs, MD;  Location: [facility];  Service: General;  Laterality: N/A;   COLONOSCOPY N/A 03/28/2014   Procedure: COLONOSCOPY;  Surgeon: Rogene Houston, MD;  Location: [facility];  Service: Endoscopy;  Laterality: N/A;  730   CORONARY STENT INTERVENTION  04/07/2020   CORONARY STENT INTERVENTION N/A 04/07/2020   Procedure: CORONARY STENT INTERVENTION;  Surgeon: Jettie Booze, MD;  Location: [facility];  Service: Cardiovascular;  Laterality: N/A;   JOINT REPLACEMENT N/A    Phreesia 08/10/2020   KNEE ARTHROSCOPY WITH DRILLING/MICROFRACTURE Left 10/20/2016   Procedure: LEFT KNEE ARTHROSCOPY WITH MICROFRACTURE;  Surgeon: Carole Civil, MD;  Location: [facility];  Service: Orthopedics;   Laterality: Left;   KNEE ARTHROSCOPY WITH DRILLING/MICROFRACTURE Left 04/27/2017   Procedure: KNEE ARTHROSCOPY WITH DRILLING/MICROFRACTURE;  Surgeon: Carole Civil, MD;  Location: [facility];  Service: Orthopedics;  Laterality: Left;   KNEE ARTHROSCOPY WITH OSTEOCHONDRAL DEFECT REPAIR Left 04/27/2017   Procedure: KNEE ARTHROSCOPY WITH OSTEOCHONDRAL DEFECT REPAIR Autograft;  Surgeon: Carole Civil, MD;  Location: [facility];  Service: Orthopedics;  Laterality: Left;   KNEE SURGERY     LEFT HEART CATH AND CORONARY ANGIOGRAPHY N/A 04/07/2020   Procedure: LEFT HEART CATH AND CORONARY ANGIOGRAPHY;  Surgeon: Jettie Booze, MD;  Location: [facility];  Service: Cardiovascular;  Laterality: N/A;   PARTIAL COLECTOMY N/A 04/08/2015   Procedure: PARTIAL COLECTOMY;  Surgeon: Aviva Signs, MD;  Location: [facility];  Service: General;  Laterality: N/A;   right knee arthroscopy     SMALL INTESTINE SURGERY N/A    Phreesia 08/10/2020   TOTAL KNEE ARTHROPLASTY Left 10/17/2017   Procedure: TOTAL KNEE ARTHROPLASTY;  Surgeon: Carole Civil, MD;  Location: [facility];  Service: Orthopedics;  Laterality: Left;  [place]    TOTAL KNEE REVISION Left 10/20/2020   Procedure: REVISION TIBIAL COMPONENT LEFT TOTAL KNEE;  Surgeon: Arther Abbott  E, MD;  Location: [facility];  Service: Orthopedics;  Laterality: Left;   TUBAL LIGATION      Social History   Socioeconomic History   Marital status: Divorced    Spouse name: Not on file   Number of children: Not on file   Years of education: college   Highest education level: Not on file  Occupational History   Occupation: nurse    Employer: [employer]  Tobacco Use   Smoking status: Every Day    Packs/day: 1.00    Years: 26.00    Total pack years: 26.00    Types: Cigarettes    Last attempt to quit: 04/05/2020    Years since quitting: 1.9   Smokeless tobacco: Never  Vaping Use   Vaping Use: Never used  Substance and Sexual Activity   Alcohol use: No     Alcohol/week: 0.0 standard drinks of alcohol   Drug use: No   Sexual activity: Yes    Birth control/protection: Surgical  Other Topics Concern   Not on file  Social History Narrative   Not on file   Social Determinants of Health   Financial Resource Strain: Not on file  Food Insecurity: Not on file  Transportation Needs: Not on file  Physical Activity: Not on file  Stress: Not on file  Social Connections: Not on file    Family History  Problem Relation Age of Onset   Diabetes type I Mother    Hyperlipidemia Mother    Diabetes type II Father    Hypertension Father    Hyperlipidemia Father    Diabetes type II Brother    Diabetes type II Brother    Cancer Other    Diabetes Other    Arthritis Other    Asthma Other    Anesthesia problems Neg Hx     Outpatient Encounter Medications as of 02/28/2022  Medication Sig   atorvastatin (LIPITOR) 40 MG tablet Take 1 tablet (40 mg total) by mouth daily.   carvedilol (COREG) 12.5 MG tablet Take 1 tablet by mouth twice daily   clopidogrel (PLAVIX) 75 MG tablet Take 1 tablet (75 mg total) by mouth daily.   docusate sodium (COLACE) 100 MG capsule Take 1 capsule (100 mg total) by mouth 2 (two) times daily.   glipiZIDE (GLUCOTROL XL) 5 MG 24 hr tablet Take 1 tablet (5 mg total) by mouth daily with breakfast.   glucose blood (ONETOUCH VERIO) test strip Use as instructed to monitor blood glucose once daily.   Krill Oil (OMEGA-3) 500 MG CAPS Take 1,000 mg by mouth daily.   Lancets (ONETOUCH DELICA PLUS [id number]) MISC by Does not apply route.   losartan (COZAAR) 50 MG tablet Take 1 tablet by mouth once daily   metFORMIN (GLUCOPHAGE-XR) 500 MG 24 hr tablet Take 2 tablets (1,000 mg total) by mouth daily with breakfast.   nitroGLYCERIN (NITROSTAT) 0.4 MG SL tablet PLACE 1 TABLET (0.4 MG TOTAL) UNDER THE TONGUE EVERY 5 (FIVE) MINUTES AS NEEDED FOR CHEST PAIN. (Patient taking differently: Place 0.4 mg under the tongue every 5 (five) minutes as  needed for chest pain.)   oxyCODONE (OXY IR/ROXICODONE) 5 MG immediate release tablet 2 tabs every 4hrs as needed   promethazine (PHENERGAN) 12.5 MG tablet TAKE 1 TABLET BY MOUTH EVERY 4 HOURS AS NEEDED FOR NAUSEA FOR VOMITING   Vitamin D, Ergocalciferol, (DRISDOL) 1.25 MG (50000 UNIT) CAPS capsule Take 1 capsule (50,000 Units total) by mouth every 7 (seven) days.   [DISCONTINUED] cholecalciferol (  VITAMIN D) 25 MCG (1000 UNIT) tablet Take 1,000 Units by mouth daily. (Patient not taking: Reported on 02/07/2022)   [DISCONTINUED] pantoprazole (PROTONIX) 40 MG tablet Take 1 tablet (40 mg total) by mouth daily. Further refills by PCP (Patient not taking: Reported on 05/18/2020)   No facility-administered encounter medications on file as of 02/28/2022.    ALLERGIES: Allergies  Allergen Reactions   Other    Flagyl [Metronidazole] Nausea Only   Hydrocodone Nausea And Vomiting and Other (See Comments)    Can take need to take with medication for nausea and vomitting    VACCINATION STATUS: Immunization History  Administered Date(s) Administered   Moderna Sars-Covid-2 Vaccination 06/25/2019   Diabetes She presents for her follow-up diabetic visit. She has type 2 diabetes mellitus. Onset time: She was diagnosed at approx age of 52. Her disease course has been improving. There are no hypoglycemic associated symptoms. Associated symptoms include fatigue and weight loss. Pertinent negatives for diabetes include no blurred vision, no polydipsia, no polyphagia, no polyuria and no visual change. There are no hypoglycemic complications. Symptoms are stable. Diabetic complications include heart disease. (Had MI recently) Risk factors for coronary artery disease include diabetes mellitus, dyslipidemia, hypertension, obesity, tobacco exposure, sedentary lifestyle and family history. Current diabetic treatment includes oral agent (dual therapy). She is compliant with treatment most of the time. Her weight is  increasing rapidly. She is following a generally unhealthy diet. Meal planning includes avoidance of concentrated sweets. She has not had a previous visit with a dietitian. She rarely participates in exercise. Her breakfast blood glucose range is generally 140-180 mg/dl. Her bedtime blood glucose range is generally 110-130 mg/dl. (She presents today with her logs, no meter, showing slightly above target fasting and at goal postprandial readings.  Her POCT A1c today is 7%, improving from last visit of 8.1%.  She is frustrated that she has not lost more weight as she has been really motivated to eat healthier.) An ACE inhibitor/angiotensin II receptor blocker is being taken. She does not see a podiatrist.Eye exam is current.  Hyperlipidemia This is a chronic problem. The current episode started more than 1 year ago. The problem is uncontrolled. Recent lipid tests were reviewed and are variable. Exacerbating diseases include diabetes and obesity. Factors aggravating her hyperlipidemia include beta blockers. Current antihyperlipidemic treatment includes statins. The current treatment provides moderate improvement of lipids. Compliance problems include adherence to diet and adherence to exercise.  Risk factors for coronary artery disease include diabetes mellitus, dyslipidemia, obesity, hypertension, a sedentary lifestyle and family history.  Hypertension This is a chronic problem. The current episode started more than 1 year ago. The problem has been resolved since onset. The problem is controlled. Pertinent negatives include no blurred vision. There are no associated agents to hypertension. Risk factors for coronary artery disease include diabetes mellitus, dyslipidemia, obesity, sedentary lifestyle, smoking/tobacco exposure and family history. Past treatments include beta blockers and angiotensin blockers. The current treatment provides mild improvement. There are no compliance problems.  Hypertensive end-organ  damage includes CAD/MI.     Review of systems  Constitutional: + decreasing body weight,  current Body mass index is 35.15 kg/m. , no fatigue, no subjective hyperthermia, no subjective hypothermia Eyes: no blurry vision, no xerophthalmia ENT: no sore throat, no nodules palpated in throat, no dysphagia/odynophagia, no hoarseness Cardiovascular: no chest pain, no shortness of breath, no palpitations, no leg swelling Respiratory: no cough, no shortness of breath Gastrointestinal: no nausea/vomiting/diarrhea Musculoskeletal: + bilateral knee pain- hx of TKR  Skin: no rashes, no hyperemia Neurological: no tremors, no numbness, no tingling, no dizziness Psychiatric: no depression, no anxiety, increased stress   Objective:     BP 119/75 (BP Location: Left Arm, Patient Position: Sitting, Cuff Size: Large)   Pulse 72   Ht '5\' 5"'$  (1.651 m)   Wt 211 lb 3.2 oz (95.8 kg)   BMI 35.15 kg/m   Wt Readings from Last 3 Encounters:  02/28/22 211 lb 3.2 oz (95.8 kg)  02/07/22 214 lb 6.4 oz (97.3 kg)  11/22/21 220 lb (99.8 kg)    BP Readings from Last 3 Encounters:  02/28/22 119/75  02/07/22 122/78  11/22/21 124/84     Physical Exam- Limited  Constitutional:  Body mass index is 35.15 kg/m. , not in acute distress, normal state of mind Eyes:  EOMI, no exophthalmos Neck: Supple Cardiovascular: RRR, no murmurs, rubs, or gallops, no edema Respiratory: Adequate breathing efforts, no crackles, rales, rhonchi, or wheezing Musculoskeletal: no gross deformities, strength intact in all four extremities, no gross restriction of joint movements Skin:  no rashes, no hyperemia Neurological: no tremor with outstretched hands   Diabetic Foot Exam - Simple   Simple Foot Form Diabetic Foot exam was performed with the following findings: Yes 02/28/2022  1:40 PM  Visual Inspection See comments: Yes Sensation Testing Intact to touch and monofilament testing bilaterally: Yes Pulse Check Posterior  Tibialis and Dorsalis pulse intact bilaterally: Yes Comments Bilateral great toes missing toenail (recently had removed due to ingrown nails), plantar wart to left posterior foot removed at same time (small callus over area.     CMP ( most recent) CMP     Component Value Date/Time   NA 137 11/11/2021 0825   K 4.5 11/11/2021 0825   CL 101 11/11/2021 0825   CO2 19 (L) 11/11/2021 0825   GLUCOSE 220 (H) 11/11/2021 0825   GLUCOSE 197 (H) 10/21/2020 0619   BUN 12 11/11/2021 0825   CREATININE 0.55 (L) 11/11/2021 0825   CALCIUM 9.4 11/11/2021 0825   PROT 6.6 11/11/2021 0825   ALBUMIN 4.3 11/11/2021 0825   AST 19 11/11/2021 0825   ALT 36 (H) 11/11/2021 0825   ALKPHOS 95 11/11/2021 0825   BILITOT 0.5 11/11/2021 0825   GFRNONAA >60 10/21/2020 0619   GFRAA >60 06/21/2019 2103     Diabetic Labs (most recent): Lab Results  Component Value Date   HGBA1C 7.0 (A) 02/28/2022   HGBA1C 8.1 (A) 11/22/2021   HGBA1C 8.1 (A) 04/29/2021   MICROALBUR 30 mg/L 02/28/2022   MICROALBUR 10 08/05/2020     Lipid Panel ( most recent) Lipid Panel     Component Value Date/Time   CHOL 118 11/11/2021 0825   TRIG 128 11/11/2021 0825   HDL 28 (L) 11/11/2021 0825   CHOLHDL 4.2 11/11/2021 0825   CHOLHDL 3.9 05/19/2020 0903   VLDL 25 05/19/2020 0903   LDLCALC 67 11/11/2021 0825   LABVLDL 23 11/11/2021 0825      Lab Results  Component Value Date   TSH 1.720 11/11/2021   TSH 0.905 05/19/2020   TSH 1.321 04/06/2020   TSH 1.152 06/21/2019   FREET4 1.34 11/11/2021   FREET4 0.92 05/19/2020      Assessment & Plan:   1) Type 2 diabetes mellitus with hyperglycemia, without long-term current use of insulin ([place])  She presents today with her logs, no meter, showing slightly above target fasting and at goal postprandial readings.  Her POCT A1c today is 7%, improving from last visit  of 8.1%.  She is frustrated that she has not lost more weight as she has been really motivated to eat healthier.  She  denies any hypoglycemia.  - [name] has currently uncontrolled symptomatic type 2 DM since 50 years of age.   -Recent labs reviewed.  - I had a long discussion with her about the progressive nature of diabetes and the pathology behind its complications. -her diabetes is complicated by CAD (recentMI) and she remains at a high risk for more acute and chronic complications which include CAD, CVA, CKD, retinopathy, and neuropathy. These are all discussed in detail with her.  - Nutritional counseling repeated at each appointment due to patients tendency to fall back in to old habits.  - The patient admits there is a room for improvement in their diet and drink choices. -  Suggestion is made for the patient to avoid simple carbohydrates from their diet including Cakes, Sweet Desserts / Pastries, Ice Cream, Soda (diet and regular), Sweet Tea, Candies, Chips, Cookies, Sweet Pastries, Store Bought Juices, Alcohol in Excess of 1-2 drinks a day, Artificial Sweeteners, Coffee Creamer, and "Sugar-free" Products. This will help patient to have stable blood glucose profile and potentially avoid unintended weight gain.   - I encouraged the patient to switch to unprocessed or minimally processed complex starch and increased protein intake (animal or plant source), fruits, and vegetables.   - Patient is advised to stick to a routine mealtimes to eat 3 meals a day and avoid unnecessary snacks (to snack only to correct hypoglycemia).  - I have approached her with the following individualized plan to manage  her diabetes and patient agrees:   -Given her improvement, no changes will be made to her regimen today. She is advised to continue Metformin 1000 mg ER to be taken daily with breakfast and Glipizide 5 mg XL daily with breakfast.  We discussed the potential to discontinue Glipizide on subsequent visits which may aide in her weight loss attempts.  -She is encouraged to continue monitoring blood glucose  twice daily, before breakfast and before bed, and to call the clinic if she has readings less than 70 or greater than 200 for 3 tests in a row.  - Adjustment parameters are given to her for hypo and hyperglycemia in writing.  - she is not a candidate for incretin therapy due to heavy smoking history and increased risk for pancreatitis.  - Specific targets for  A1c;  LDL, HDL,  and Triglycerides were discussed with the patient.  2) Blood Pressure /Hypertension:  her blood pressure is controlled to target.   she is advised to continue her current medications including Coreg 12.5 mg po twice daily and Losartan 50 mg p.o. daily with breakfast.  3) Lipids/Hyperlipidemia:    Review of her recent lipid panel from 11/11/21 showed controlled LDL at 67-improved.  she is advised to continue Lipitor 80 mg daily at bedtime and Omega 3 Krill oil caps po daily.  Side effects and precautions discussed with her.    4)  Weight/Diet:  her Body mass index is 35.15 kg/m.  -  clearly complicating her diabetes care.   she is a candidate for weight loss. I discussed with her the fact that loss of 5 - 10% of her  current body weight will have the most impact on her diabetes management.  Exercise, and detailed carbohydrates information provided  -  detailed on discharge instructions.  5) Vitamin D deficiency Her recent vitamin D level on  11/11/21 was 16.  She is currently on Ergocalciferol 50000 units weekly x 24 weeks.  6) Chronic Care/Health Maintenance: -she is on ACEI/ARB and Statin medications and is encouraged to initiate and continue to follow up with Ophthalmology, Dentist, Podiatrist at least yearly or according to recommendations, and advised to [place]. I have recommended yearly flu vaccine and pneumonia vaccine at least every 5 years; moderate intensity exercise for up to 150 minutes weekly; and sleep for at least 7 hours a day.  - she is advised to maintain close follow up with Pcp, No for primary care  needs, as well as her other providers for optimal and coordinated care.      I spent 48 minutes in the care of the patient today including review of labs from [place], Lipids, Thyroid Function, Hematology (current and previous including abstractions from other facilities); face-to-face time discussing  her blood glucose readings/logs, discussing hypoglycemia and hyperglycemia episodes and symptoms, medications doses, her options of short and long term treatment based on the latest standards of care / guidelines;  discussion about incorporating lifestyle medicine;  and documenting the encounter. Risk reduction counseling performed per USPSTF guidelines to reduce obesity and cardiovascular risk factors.     Please refer to Patient Instructions for Blood Glucose Monitoring and Insulin/Medications Dosing Guide"  in media tab for additional information. Please  also refer to " Patient Self Inventory" in the Media  tab for reviewed elements of pertinent patient history.  [name] participated in the discussions, expressed understanding, and voiced agreement with the above plans.  All questions were answered to her satisfaction. she is encouraged to contact clinic should she have any questions or concerns prior to her return visit.   Follow up plan: - Return in about 4 months (around 06/29/2022) for Diabetes F/U with A1c in office, Previsit labs, Bring meter and logs.   [name], [facility] [address] [place], [place] [address] Phone: [phone] Fax: [phone]  02/28/2022, 1:46 PM

## 2022-03-03 ENCOUNTER — Encounter: Payer: Self-pay | Admitting: Orthopedic Surgery

## 2022-03-03 DIAGNOSIS — Z96652 Presence of left artificial knee joint: Secondary | ICD-10-CM

## 2022-03-03 DIAGNOSIS — Z96659 Presence of unspecified artificial knee joint: Secondary | ICD-10-CM

## 2022-03-03 DIAGNOSIS — G8929 Other chronic pain: Secondary | ICD-10-CM

## 2022-03-03 MED ORDER — OXYCODONE HCL 5 MG PO TABS: ORAL_TABLET | ORAL | 0 refills | Status: DC

## 2022-03-06 ENCOUNTER — Other Ambulatory Visit: Payer: Self-pay | Admitting: Cardiology

## 2022-03-14 ENCOUNTER — Encounter: Payer: Self-pay | Admitting: Orthopedic Surgery

## 2022-03-14 DIAGNOSIS — T84038D Mechanical loosening of other internal prosthetic joint, subsequent encounter: Secondary | ICD-10-CM

## 2022-03-14 DIAGNOSIS — G8929 Other chronic pain: Secondary | ICD-10-CM

## 2022-03-14 DIAGNOSIS — Z96652 Presence of left artificial knee joint: Secondary | ICD-10-CM

## 2022-03-15 ENCOUNTER — Encounter: Payer: Self-pay | Admitting: Orthopedic Surgery

## 2022-03-15 ENCOUNTER — Ambulatory Visit: Payer: 59 | Admitting: Nurse Practitioner

## 2022-03-15 MED ORDER — OXYCODONE HCL 5 MG PO TABS: ORAL_TABLET | ORAL | 0 refills | Status: DC

## 2022-03-27 ENCOUNTER — Encounter: Payer: Self-pay | Admitting: Orthopedic Surgery

## 2022-03-28 ENCOUNTER — Encounter: Payer: Self-pay | Admitting: Orthopedic Surgery

## 2022-03-28 ENCOUNTER — Other Ambulatory Visit: Payer: Self-pay

## 2022-03-28 DIAGNOSIS — Z96659 Presence of unspecified artificial knee joint: Secondary | ICD-10-CM

## 2022-03-28 DIAGNOSIS — G8929 Other chronic pain: Secondary | ICD-10-CM

## 2022-03-28 DIAGNOSIS — Z96652 Presence of left artificial knee joint: Secondary | ICD-10-CM

## 2022-03-28 MED ORDER — OXYCODONE HCL 5 MG PO TABS: ORAL_TABLET | ORAL | 0 refills | Status: DC

## 2022-04-07 ENCOUNTER — Other Ambulatory Visit: Payer: Self-pay

## 2022-04-07 ENCOUNTER — Encounter: Payer: Self-pay | Admitting: Orthopedic Surgery

## 2022-04-07 DIAGNOSIS — Z96652 Presence of left artificial knee joint: Secondary | ICD-10-CM

## 2022-04-07 DIAGNOSIS — G8929 Other chronic pain: Secondary | ICD-10-CM

## 2022-04-07 DIAGNOSIS — Z96659 Presence of unspecified artificial knee joint: Secondary | ICD-10-CM

## 2022-04-07 MED ORDER — OXYCODONE HCL 5 MG PO TABS: ORAL_TABLET | ORAL | 0 refills | Status: DC

## 2022-04-14 ENCOUNTER — Encounter: Payer: Self-pay | Admitting: Orthopedic Surgery

## 2022-04-14 DIAGNOSIS — G8929 Other chronic pain: Secondary | ICD-10-CM

## 2022-04-14 DIAGNOSIS — Z96659 Presence of unspecified artificial knee joint: Secondary | ICD-10-CM

## 2022-04-14 DIAGNOSIS — Z96652 Presence of left artificial knee joint: Secondary | ICD-10-CM

## 2022-04-14 MED ORDER — OXYCODONE HCL 5 MG PO TABS: ORAL_TABLET | ORAL | 0 refills | Status: DC

## 2022-04-15 ENCOUNTER — Telehealth: Payer: Self-pay | Admitting: Orthopedic Surgery

## 2022-04-15 NOTE — Telephone Encounter (Signed)
Ryleigh w/[facility] [phone]) called, lvm stating the patient needs a prior authorization for one of her medications.

## 2022-04-20 ENCOUNTER — Encounter: Payer: Self-pay | Admitting: Nurse Practitioner

## 2022-04-20 ENCOUNTER — Ambulatory Visit: Payer: 59 | Attending: Nurse Practitioner | Admitting: Nurse Practitioner

## 2022-04-20 ENCOUNTER — Encounter: Payer: Self-pay | Admitting: Orthopedic Surgery

## 2022-04-20 VITALS — BP 126/72 | HR 80 | Ht 65.0 in | Wt 216.0 lb

## 2022-04-20 DIAGNOSIS — E669 Obesity, unspecified: Secondary | ICD-10-CM

## 2022-04-20 DIAGNOSIS — Z720 Tobacco use: Secondary | ICD-10-CM | POA: Diagnosis not present

## 2022-04-20 DIAGNOSIS — I10 Essential (primary) hypertension: Secondary | ICD-10-CM

## 2022-04-20 DIAGNOSIS — E785 Hyperlipidemia, unspecified: Secondary | ICD-10-CM | POA: Diagnosis not present

## 2022-04-20 DIAGNOSIS — I2510 Atherosclerotic heart disease of native coronary artery without angina pectoris: Secondary | ICD-10-CM | POA: Diagnosis not present

## 2022-04-20 NOTE — Progress Notes (Unsigned)
Cardiology Office Note:    Date:  04/20/2022  ID:  [name], DOB Dec 19, 1970, MRN [id number]  PCP:  Kathyrn Lass   [facility] Cardiologist:  Carlyle Dolly, MD     Referring MD: No ref. provider found   CC: Here for follow-up  History of Present Illness:    [name] is a 51 y.o. female with a hx of the following:  CAD, status post NSTEMI with prior stenting Hypertension Type 2 diabetes History of chest pain HLD Tobacco abuse  Patient is a very pleasant 51 year old female with past medical history as mentioned above.  History of cardiac catheterization in 2021 that revealed mild diffuse LAD disease, RCA proximal 70%, left circumflex mid 99% and distal 50%.  Was treated with drug-eluting stent to the left circumflex as well as to the RCA.  It was recommended to continue Plavix as monotherapy after 1 year due to diffuse CAD.  Echocardiogram at that time revealed EF 65 to 70%, no RWMA.  History of not being able to tolerate atorvastatin (myalgias).   Last seen by Cadence Jorene Minors on February 07, 2022.  She was doing well from a cardiac perspective.  Noted occasional chest tightness feeling, not associated with exertion.  Denies shortness of breath.  Noted new lower leg edema prior to this office visit.  Noted it to be worse at night, resolved in the morning.  Was not following a low salt diet.  Denied any other cardiac issue or complaint.  Low-salt diet, compression socks, and leg elevation were discussed.  Updated echocardiogram revealed normal EF, no significant valvular abnormalities. Was told to follow-up in 1 to 2 months.  Today she presents for follow-up.  She states she is doing well.  Denies any chest pain, shortness of breath, palpitations, syncope, presyncope, dizziness, orthopnea, PND, swelling, significant weight changes, acute bleeding, or claudication.  BP well-controlled at home.  Does smoke around 1 pack/day.  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  Denies any other questions or concerns today.  Past Medical History:  Diagnosis Date   Arthritis    Carpal tunnel syndrome, bilateral    Coronary artery disease    Diabetes mellitus without complication ([place])    no meds; diet controlled   Diverticulitis    Hyperlipidemia    Phreesia 08/10/2020   Hypertension    Hypertriglyceridemia    Myocardial infarction ([place])    Phreesia 08/10/2020   PONV (postoperative nausea and vomiting)    Sciatica     Past Surgical History:  Procedure Laterality Date   ablasion of uterus     CARDIAC CATHETERIZATION     CARPAL TUNNEL RELEASE  11/10/2010   Procedure: CARPAL TUNNEL RELEASE;  Surgeon: Arther Abbott, MD;  Location: [facility];  Service: Orthopedics;  Laterality: Right;   CHOLECYSTECTOMY     [facility]   COLON RESECTION N/A 04/08/2015   Procedure: LAPAROSCOPIC HAND ASSISTED PARTIAL COLECTOMY  CONVERTED TO OPEN AT 2992;  Surgeon: Aviva Signs, MD;  Location: [facility];  Service: General;  Laterality: N/A;   COLONOSCOPY N/A 03/28/2014   Procedure: COLONOSCOPY;  Surgeon: Rogene Houston, MD;  [facility];  Service: Endoscopy;  Laterality: N/A;  730   CORONARY STENT INTERVENTION  04/07/2020   CORONARY STENT INTERVENTION N/A 04/07/2020   Procedure: CORONARY STENT INTERVENTION;  Surgeon: Jettie Booze, MD;  Location: [place] [facility];  Service: Cardiovascular;  Laterality: N/A;   JOINT REPLACEMENT N/A  Phreesia 08/10/2020   KNEE ARTHROSCOPY WITH DRILLING/MICROFRACTURE Left 10/20/2016   Procedure: LEFT KNEE ARTHROSCOPY WITH MICROFRACTURE;  Surgeon: Carole Civil, MD;  Location: [facility];  Service: Orthopedics;  Laterality: Left;   KNEE ARTHROSCOPY WITH DRILLING/MICROFRACTURE Left 04/27/2017   Procedure: KNEE ARTHROSCOPY WITH DRILLING/MICROFRACTURE;  Surgeon: Carole Civil, MD;  Location: [facility];  Service: Orthopedics;  Laterality: Left;   KNEE ARTHROSCOPY WITH OSTEOCHONDRAL DEFECT REPAIR Left 04/27/2017    Procedure: KNEE ARTHROSCOPY WITH OSTEOCHONDRAL DEFECT REPAIR Autograft;  Surgeon: Carole Civil, MD;  Location: [facility];  Service: Orthopedics;  Laterality: Left;   KNEE SURGERY     LEFT HEART CATH AND CORONARY ANGIOGRAPHY N/A 04/07/2020   Procedure: LEFT HEART CATH AND CORONARY ANGIOGRAPHY;  Surgeon: Jettie Booze, MD;  Location: [facility];  Service: Cardiovascular;  Laterality: N/A;   PARTIAL COLECTOMY N/A 04/08/2015   Procedure: PARTIAL COLECTOMY;  Surgeon: Aviva Signs, MD;  Location: [facility];  Service: General;  Laterality: N/A;   right knee arthroscopy     SMALL INTESTINE SURGERY N/A    Phreesia 08/10/2020   TOTAL KNEE ARTHROPLASTY Left 10/17/2017   Procedure: TOTAL KNEE ARTHROPLASTY;  Surgeon: Carole Civil, MD;  Location: [facility];  Service: Orthopedics;  Laterality: Left;  [place]    TOTAL KNEE REVISION Left 10/20/2020   Procedure: REVISION TIBIAL COMPONENT LEFT TOTAL KNEE;  Surgeon: Carole Civil, MD;  Location: [facility];  Service: Orthopedics;  Laterality: Left;   TUBAL LIGATION      Current Medications: Current Meds  Medication Sig   atorvastatin (LIPITOR) 40 MG tablet Take 1 tablet (40 mg total) by mouth daily.   carvedilol (COREG) 12.5 MG tablet Take 1 tablet by mouth twice daily   clopidogrel (PLAVIX) 75 MG tablet Take 1 tablet by mouth once daily   docusate sodium (COLACE) 100 MG capsule Take 1 capsule (100 mg total) by mouth 2 (two) times daily.   glipiZIDE (GLUCOTROL XL) 5 MG 24 hr tablet Take 1 tablet (5 mg total) by mouth daily with breakfast.   glucose blood (ONETOUCH VERIO) test strip Use as instructed to monitor blood glucose once daily.   Krill Oil (OMEGA-3) 500 MG CAPS Take 1,000 mg by mouth daily.   Lancets (ONETOUCH DELICA PLUS XNTZGY17C) MISC by Does not apply route.   losartan (COZAAR) 50 MG tablet Take 1 tablet by mouth once daily   metFORMIN (GLUCOPHAGE-XR) 500 MG 24 hr tablet Take 2 tablets (1,000 mg total) by mouth daily with  breakfast.   nitroGLYCERIN (NITROSTAT) 0.4 MG SL tablet PLACE 1 TABLET (0.4 MG TOTAL) UNDER THE TONGUE EVERY 5 (FIVE) MINUTES AS NEEDED FOR CHEST PAIN. (Patient taking differently: Place 0.4 mg under the tongue every 5 (five) minutes as needed for chest pain.)   oxyCODONE (OXY IR/ROXICODONE) 5 MG immediate release tablet 2 tabs every 4hrs as needed   promethazine (PHENERGAN) 12.5 MG tablet TAKE 1 TABLET BY MOUTH EVERY 4 HOURS AS NEEDED FOR NAUSEA FOR VOMITING   Vitamin D, Ergocalciferol, (DRISDOL) 1.25 MG (50000 UNIT) CAPS capsule Take 1 capsule (50,000 Units total) by mouth every 7 (seven) days.     Allergies:   Other, Flagyl [metronidazole], and Hydrocodone   Social History   Socioeconomic History   Marital status: Divorced    Spouse name: Not on file   Number of children: Not on file   Years of education: college   Highest education level: Not on file  Occupational History   Occupation: nurse  Employer: [employer]  Tobacco Use   Smoking status: Every Day    Packs/day: 1.00    Years: 26.00    Total pack years: 26.00    Types: Cigarettes    Last attempt to quit: 04/05/2020    Years since quitting: 2.0   Smokeless tobacco: Never  Vaping Use   Vaping Use: Never used  Substance and Sexual Activity   Alcohol use: No    Alcohol/week: 0.0 standard drinks of alcohol   Drug use: No   Sexual activity: Yes    Birth control/protection: Surgical  Other Topics Concern   Not on file  Social History Narrative   Not on file   Social Determinants of Health   Financial Resource Strain: Not on file  Food Insecurity: Not on file  Transportation Needs: Not on file  Physical Activity: Not on file  Stress: Not on file  Social Connections: Not on file     Family History: The patient's family history includes Arthritis in an other family member; Asthma in an other family member; Cancer in an other family member; Diabetes in an other family member; Diabetes type I in her mother;  Diabetes type II in her brother, brother, and father; Hyperlipidemia in her father and mother; Hypertension in her father. There is no history of Anesthesia problems.  ROS:   Review of Systems  Constitutional: Negative.   HENT: Negative.    Eyes: Negative.   Respiratory: Negative.    Cardiovascular: Negative.   Gastrointestinal: Negative.   Genitourinary: Negative.   Musculoskeletal: Negative.   Skin: Negative.   Neurological: Negative.   Endo/Heme/Allergies: Negative.   Psychiatric/Behavioral: Negative.      Please see the history of present illness.    All other systems reviewed and are negative.  EKGs/Labs/Other Studies Reviewed:    The following studies were reviewed today:   EKG:  EKG is not ordered today.    2D echocardiogram on February 14, 2022:  1. Left ventricular ejection fraction, by estimation, is 60 to 65%. The  left ventricle has normal function. The left ventricle has no regional  wall motion abnormalities. Left ventricular diastolic parameters were  normal.   2. Right ventricular systolic function is normal. The right ventricular  size is normal. Tricuspid regurgitation signal is inadequate for assessing  PA pressure.   3. The mitral valve is normal in structure. No evidence of mitral valve  regurgitation. No evidence of mitral stenosis.   4. The aortic valve is tricuspid. Aortic valve regurgitation is not  visualized. No aortic stenosis is present.   5. The inferior vena cava is normal in size with greater than 50%  respiratory variability, suggesting right atrial pressure of 3 mmHg.  Left heart cath and coronary angiography on April 07, 2020: Dist Cx lesion is 50% stenosed. Mid Cx lesion is 99% stenosed. This was the culprit lesion for presentation. A drug-eluting stent was successfully placed using a STENT RESOLUTE ONYX 3.0X22, postdilated to 3.75 mm, and optimized by IVUS Post intervention, there is a 0% residual stenosis. Prox RCA lesion is 70%  stenosed. Cross sectional area 3.9 mm2 by IVUS. A drug-eluting stent was successfully placed using a STENT RESOLUTE ONYX 3.5X12, postdilated to > 4 mm and optimized by IVUS. RPDA lesion is 50% stenosed. Post intervention, there is a 0% residual stenosis. The left ventricular systolic function is normal. LV end diastolic pressure is mildly elevated. The left ventricular ejection fraction is 55-65% by visual estimate. There is no aortic valve  stenosis.   Consider clopidogrel monotherapy after 12 months of DAPT given her diffuse distal vessel disease.    Continue aggressive secondary prevention.    Recent Labs: 11/11/2021: ALT 36; BUN 12; Creatinine, Ser 0.55; Potassium 4.5; Sodium 137; TSH 1.720 02/07/2022: Hemoglobin 14.1; Platelets 334  Recent Lipid Panel    Component Value Date/Time   CHOL 118 11/11/2021 0825   TRIG 128 11/11/2021 0825   HDL 28 (L) 11/11/2021 0825   CHOLHDL 4.2 11/11/2021 0825   CHOLHDL 3.9 05/19/2020 0903   VLDL 25 05/19/2020 0903   LDLCALC 67 11/11/2021 0825   Physical Exam:    VS:  BP 126/72   Pulse 80   Ht '5\' 5"'$  (1.651 m)   Wt 216 lb (98 kg)   SpO2 96%   BMI 35.94 kg/m     Wt Readings from Last 3 Encounters:  04/20/22 216 lb (98 kg)  02/28/22 211 lb 3.2 oz (95.8 kg)  02/07/22 214 lb 6.4 oz (97.3 kg)     GEN: Obese, 51 y.o. female in no acute distress HEENT: Normal NECK: No JVD; No carotid bruits CARDIAC: S1/S2, RRR, no murmurs, rubs, gallops; 2+ peripheral pulses throughout, strong bilateral RESPIRATORY:  Clear to auscultation without rales, wheezing or rhonchi  MUSCULOSKELETAL:  No edema; No deformity  SKIN: Warm and dry NEUROLOGIC:  Alert and oriented x 3 PSYCHIATRIC:  Normal affect   ASSESSMENT:    1. Coronary artery disease involving native heart without angina pectoris, unspecified vessel or lesion type   2. Hypertension, unspecified type   3. Hyperlipidemia, unspecified hyperlipidemia type   4. Tobacco abuse   5. Obesity (BMI  30-39.9)    PLAN:    In order of problems listed above:  CAD, s/p NSTEMI with prior stenting Stable with no anginal symptoms. No indication for ischemic evaluation.  Continue atorvastatin, carvedilol, Plavix, losartan, and nitroglycerin as needed. Heart healthy diet and regular cardiovascular exercise encouraged.   HTN Blood pressure today 126/72.  BP well-controlled at home.  Continue current medication regimen. Discussed to monitor BP at home at least 2 hours after medications and sitting for 5-10 minutes. Heart healthy diet and regular cardiovascular exercise encouraged.   HLD Lipid profile from July 2023 revealed total cholesterol 118, triglycerides 128, HDL 28, and LDL 67.  Continue atorvastatin. Heart healthy diet and regular cardiovascular exercise encouraged.   Tobacco abuse Smokes around 1 pack/day.  Smoking cessation encouraged and discussed.  I offered resources for smoking cessation, however she politely declines at this time.   Obesity BMI today 35.94. Weight loss via diet and exercise encouraged. Discussed the impact being overweight would have on cardiovascular risk. Heart healthy diet and regular cardiovascular exercise encouraged.   Disposition: Follow-up with Dr. Carlyle Dolly in 6 months or sooner if anything changes.   Medication Adjustments/Labs and Tests Ordered: Current medicines are reviewed at length with the patient today.  Concerns regarding medicines are outlined above.  No orders of the defined types were placed in this encounter.  No orders of the defined types were placed in this encounter.   Patient Instructions  Medication Instructions:  Your physician recommends that you continue on your current medications as directed. Please refer to the Current Medication list given to you today.   Labwork: None today  Testing/Procedures: None today  Follow-Up: 6 months  Any Other Special Instructions Will Be Listed Below (If Applicable).  If you  need a refill on your cardiac medications before your next appointment, please call your  pharmacy.    Signed, Finis Bud, NP  04/21/2022 8:32 PM    [place]

## 2022-04-20 NOTE — Patient Instructions (Signed)
Medication Instructions:  Your physician recommends that you continue on your current medications as directed. Please refer to the Current Medication list given to you today.   Labwork: None today  Testing/Procedures: None today  Follow-Up: 6 months  Any Other Special Instructions Will Be Listed Below (If Applicable).  If you need a refill on your cardiac medications before your next appointment, please call your pharmacy.  

## 2022-04-29 ENCOUNTER — Encounter: Payer: Self-pay | Admitting: Orthopedic Surgery

## 2022-04-29 ENCOUNTER — Other Ambulatory Visit: Payer: Self-pay

## 2022-04-29 DIAGNOSIS — Z96652 Presence of left artificial knee joint: Secondary | ICD-10-CM

## 2022-04-29 DIAGNOSIS — G8929 Other chronic pain: Secondary | ICD-10-CM

## 2022-04-29 DIAGNOSIS — T84038D Mechanical loosening of other internal prosthetic joint, subsequent encounter: Secondary | ICD-10-CM

## 2022-04-29 MED ORDER — OXYCODONE HCL 5 MG PO TABS: ORAL_TABLET | ORAL | 0 refills | Status: DC

## 2022-05-06 ENCOUNTER — Encounter: Payer: Self-pay | Admitting: Orthopedic Surgery

## 2022-05-06 ENCOUNTER — Encounter: Payer: Self-pay | Admitting: Nurse Practitioner

## 2022-05-09 ENCOUNTER — Other Ambulatory Visit: Payer: Self-pay

## 2022-05-09 ENCOUNTER — Encounter: Payer: Self-pay | Admitting: Orthopedic Surgery

## 2022-05-09 DIAGNOSIS — Z96652 Presence of left artificial knee joint: Secondary | ICD-10-CM

## 2022-05-09 DIAGNOSIS — Z96659 Presence of unspecified artificial knee joint: Secondary | ICD-10-CM

## 2022-05-09 DIAGNOSIS — G8929 Other chronic pain: Secondary | ICD-10-CM

## 2022-05-09 MED ORDER — VITAMIN D (ERGOCALCIFEROL) 1.25 MG (50000 UNIT) PO CAPS: 50000.0000 [IU] | ORAL_CAPSULE | ORAL | 1 refills | Status: DC

## 2022-05-10 ENCOUNTER — Other Ambulatory Visit: Payer: Self-pay | Admitting: Orthopedic Surgery

## 2022-05-10 ENCOUNTER — Encounter: Payer: Self-pay | Admitting: Orthopedic Surgery

## 2022-05-10 MED ORDER — OXYCODONE HCL 5 MG PO CAPS: 5.0000 mg | ORAL_CAPSULE | Freq: Three times a day (TID) | ORAL | 0 refills | Status: DC | PRN

## 2022-05-10 MED ORDER — OXYCODONE HCL 5 MG PO TABS: ORAL_TABLET | ORAL | 0 refills | Status: DC

## 2022-05-12 ENCOUNTER — Encounter: Payer: Self-pay | Admitting: Orthopedic Surgery

## 2022-05-13 ENCOUNTER — Other Ambulatory Visit: Payer: Self-pay | Admitting: Orthopedic Surgery

## 2022-05-13 ENCOUNTER — Telehealth: Payer: Self-pay | Admitting: Orthopedic Surgery

## 2022-05-13 DIAGNOSIS — G8929 Other chronic pain: Secondary | ICD-10-CM

## 2022-05-13 MED ORDER — OXYCODONE HCL 10 MG PO TABS: 10.0000 mg | ORAL_TABLET | Freq: Four times a day (QID) | ORAL | 0 refills | Status: DC | PRN

## 2022-05-13 NOTE — Telephone Encounter (Signed)
Called to discuss denial of pain meds  [phone]  [phone] our fax #  Called [employer] pre auth  Needs appt in 1 month then every 3 months

## 2022-05-13 NOTE — Progress Notes (Signed)
Prior Josem Kaufmann [phone]  Meds ordered this encounter  Medications   Oxycodone HCl 10 MG TABS    Sig: Take 1 tablet (10 mg total) by mouth every 6 (six) hours as needed.    Dispense:  120 tablet    Refill:  0    30 days   Needs appt in 30 days   And every 3 months

## 2022-05-16 ENCOUNTER — Other Ambulatory Visit: Payer: Self-pay | Admitting: Cardiology

## 2022-05-26 ENCOUNTER — Other Ambulatory Visit: Payer: Self-pay | Admitting: Cardiology

## 2022-06-02 ENCOUNTER — Ambulatory Visit: Payer: 59 | Admitting: Orthopedic Surgery

## 2022-06-02 DIAGNOSIS — M25562 Pain in left knee: Secondary | ICD-10-CM | POA: Diagnosis not present

## 2022-06-02 DIAGNOSIS — G8929 Other chronic pain: Secondary | ICD-10-CM

## 2022-06-02 DIAGNOSIS — G894 Chronic pain syndrome: Secondary | ICD-10-CM | POA: Diagnosis not present

## 2022-06-02 NOTE — Progress Notes (Signed)
Encounter Diagnoses  Name Primary?   Chronic pain of left knee Yes   Chronic pain syndrome    Chief Complaint  Patient presents with   Follow-up    Recheck on left knee, DOS 10-20-20.   [name] presents for evaluation for chronic pain management currently on opioid oxycodone 10 mg  She is not getting as good relief with 40 mg of oxycodone per day with the oxycodone 10 as she was getting with the Oxy IR 5 mg 2 tablets every 6 hours  However she would not like to increase the medicine  No side effects  Taking as prescribed  Follow-up in 3 months  Consider going to the Roxicodone that she was on before in the form of Oxy IR 5 mg which comes in a 15 mg tablet

## 2022-06-06 ENCOUNTER — Encounter: Payer: Self-pay | Admitting: Orthopedic Surgery

## 2022-06-06 DIAGNOSIS — Z96652 Presence of left artificial knee joint: Secondary | ICD-10-CM

## 2022-06-06 MED ORDER — PROMETHAZINE HCL 12.5 MG PO TABS: ORAL_TABLET | ORAL | 0 refills | Status: DC

## 2022-06-13 ENCOUNTER — Encounter: Payer: Self-pay | Admitting: Orthopedic Surgery

## 2022-06-13 DIAGNOSIS — G8929 Other chronic pain: Secondary | ICD-10-CM

## 2022-06-13 MED ORDER — OXYCODONE HCL 10 MG PO TABS: 10.0000 mg | ORAL_TABLET | Freq: Four times a day (QID) | ORAL | 0 refills | Status: DC | PRN

## 2022-06-14 ENCOUNTER — Ambulatory Visit: Payer: 59 | Admitting: Internal Medicine

## 2022-06-14 ENCOUNTER — Encounter: Payer: Self-pay | Admitting: Internal Medicine

## 2022-06-14 ENCOUNTER — Other Ambulatory Visit: Payer: Self-pay | Admitting: Internal Medicine

## 2022-06-14 VITALS — BP 139/82 | HR 80 | Temp 98.8°F | Ht 65.0 in | Wt 224.0 lb

## 2022-06-14 DIAGNOSIS — K760 Fatty (change of) liver, not elsewhere classified: Secondary | ICD-10-CM | POA: Diagnosis not present

## 2022-06-14 DIAGNOSIS — I2510 Atherosclerotic heart disease of native coronary artery without angina pectoris: Secondary | ICD-10-CM

## 2022-06-14 DIAGNOSIS — F1190 Opioid use, unspecified, uncomplicated: Secondary | ICD-10-CM

## 2022-06-14 DIAGNOSIS — R0683 Snoring: Secondary | ICD-10-CM

## 2022-06-14 DIAGNOSIS — I2583 Coronary atherosclerosis due to lipid rich plaque: Secondary | ICD-10-CM | POA: Diagnosis not present

## 2022-06-14 DIAGNOSIS — E669 Obesity, unspecified: Secondary | ICD-10-CM

## 2022-06-14 DIAGNOSIS — Z0001 Encounter for general adult medical examination with abnormal findings: Secondary | ICD-10-CM

## 2022-06-14 DIAGNOSIS — R69 Illness, unspecified: Secondary | ICD-10-CM | POA: Diagnosis not present

## 2022-06-14 DIAGNOSIS — E1169 Type 2 diabetes mellitus with other specified complication: Secondary | ICD-10-CM

## 2022-06-14 DIAGNOSIS — I10 Essential (primary) hypertension: Secondary | ICD-10-CM

## 2022-06-14 DIAGNOSIS — E782 Mixed hyperlipidemia: Secondary | ICD-10-CM

## 2022-06-14 NOTE — Progress Notes (Unsigned)
HPI:[name] is a 52 y.o. female living with HLD,HTN,DM2, tobacco use disorder, CAD with NSTEMI s/p DES to Lcx and RCA in 2021, and history of left total knee replacement with ongoing chronic left knee pain on chronic opioid pain medications who presents to establish care. For the details of today's visit, please refer to the assessment and plan.  Past Medical History:  Diagnosis Date   Arthritis    Carpal tunnel syndrome, bilateral    Coronary artery disease    Diabetes mellitus without complication ([place])    no meds; diet controlled   Diverticulitis    Hyperlipidemia    Phreesia 08/10/2020   Hypertension    Hypertriglyceridemia    Myocardial infarction ([place])    Phreesia 08/10/2020   PONV (postoperative nausea and vomiting)    Sciatica     Past Surgical History:  Procedure Laterality Date   ablasion of uterus     CARDIAC CATHETERIZATION     CARPAL TUNNEL RELEASE  11/10/2010   Procedure: CARPAL TUNNEL RELEASE;  Surgeon: Arther Abbott, MD;  [facility];  Service: Orthopedics;  Laterality: Right;   CHOLECYSTECTOMY     [facility]   COLON RESECTION N/A 04/08/2015   Procedure: LAPAROSCOPIC HAND ASSISTED PARTIAL COLECTOMY  CONVERTED TO OPEN AT [id number];  Surgeon: Aviva Signs, MD;  Location: [facility];  Service: General;  Laterality: N/A;   COLONOSCOPY N/A 03/28/2014   Procedure: COLONOSCOPY;  Surgeon: Rogene Houston, MD;  [facility];  Service: Endoscopy;  Laterality: N/A;  730   CORONARY STENT INTERVENTION  04/07/2020   CORONARY STENT INTERVENTION N/A 04/07/2020   Procedure: CORONARY STENT INTERVENTION;  Surgeon: Jettie Booze, MD;  Location: [place] [facility];  Service: Cardiovascular;  Laterality: N/A;   JOINT REPLACEMENT N/A    Phreesia 08/10/2020   KNEE ARTHROSCOPY WITH DRILLING/MICROFRACTURE Left 10/20/2016   Procedure: LEFT KNEE ARTHROSCOPY WITH MICROFRACTURE;  Surgeon: Carole Civil, MD;  [facility];  Service: Orthopedics;  Laterality:  Left;   KNEE ARTHROSCOPY WITH DRILLING/MICROFRACTURE Left 04/27/2017   Procedure: KNEE ARTHROSCOPY WITH DRILLING/MICROFRACTURE;  Surgeon: Carole Civil, MD;  [facility];  Service: Orthopedics;  Laterality: Left;   KNEE ARTHROSCOPY WITH OSTEOCHONDRAL DEFECT REPAIR Left 04/27/2017   Procedure: KNEE ARTHROSCOPY WITH OSTEOCHONDRAL DEFECT REPAIR Autograft;  Surgeon: Carole Civil, MD;  [facility];  Service: Orthopedics;  Laterality: Left;   KNEE SURGERY     LEFT HEART CATH AND CORONARY ANGIOGRAPHY N/A 04/07/2020   Procedure: LEFT HEART CATH AND CORONARY ANGIOGRAPHY;  Surgeon: Jettie Booze, MD;  Location: [place] [place] [facility];  Service: Cardiovascular;  Laterality: N/A;   PARTIAL COLECTOMY N/A 04/08/2015   Procedure: PARTIAL COLECTOMY;  Surgeon: Aviva Signs, MD;  [facility];  Service: General;  Laterality: N/A;   right knee arthroscopy     SMALL INTESTINE SURGERY N/A    Phreesia 08/10/2020   TOTAL KNEE ARTHROPLASTY Left 10/17/2017   Procedure: TOTAL KNEE ARTHROPLASTY;  Surgeon: Carole Civil, MD;  [facility];  Service: Orthopedics;  Laterality: Left;  DePuy    TOTAL KNEE REVISION Left 10/20/2020   Procedure: REVISION TIBIAL COMPONENT LEFT TOTAL KNEE;  Surgeon: Carole Civil, MD;  [facility];  Service: Orthopedics;  Laterality: Left;   TUBAL LIGATION      Family History  Problem Relation Age of Onset   Diabetes type I Mother    Hyperlipidemia Mother    Diabetes type II Father  Hypertension Father    Hyperlipidemia Father    Diabetes type II Brother    Diabetes type II Brother    Cancer Other    Diabetes Other    Arthritis Other    Asthma Other    Anesthesia problems Neg Hx     Social History   Tobacco Use   Smoking status: Every Day    Packs/day: 1.00    Years: 26.00    Total pack years: 26.00    Types: Cigarettes    Last attempt to quit: 04/05/2020    Years since quitting: 2.1   Smokeless tobacco: Never  Vaping Use    Vaping Use: Never used  Substance Use Topics   Alcohol use: No    Alcohol/week: 0.0 standard drinks of alcohol   Drug use: No    Physical Exam: Vitals:   06/14/22 1605  BP: 139/82  Pulse: 80  Temp: 98.8 F (37.1 C)  TempSrc: Oral  SpO2: 95%  Weight: 224 lb (101.6 kg)  Height: 5' 5"$  (1.651 m)     Physical Exam Vitals reviewed.  Constitutional:      Appearance: She is well-developed and well-groomed.  Eyes:     General: No scleral icterus.    Conjunctiva/sclera: Conjunctivae normal.  Cardiovascular:     Rate and Rhythm: Normal rate and regular rhythm.     Heart sounds: No murmur heard.    No friction rub. No gallop.  Pulmonary:     Effort: Pulmonary effort is normal.     Breath sounds: No wheezing, rhonchi or rales.  Musculoskeletal:     Right lower leg: No edema.     Left lower leg: No edema.  Skin:    General: Skin is warm and dry.      Assessment & Plan:   Encounter for general adult medical examination with abnormal findings -     CBC with Differential/Platelet -     VITAMIN D 25 Hydroxy (Vit-D Deficiency, Fractures) -     BMP8+EGFR  Mixed hyperlipidemia Assessment & Plan: On atorvastatin 40 mg for secondary prevention. LDL 67 in 2023.  Assessment/Plan: Stable chronic illness - Continue atorvastatin 40 mg     Coronary artery disease due to lipid rich plaque Assessment & Plan: Assessment/Plan: Stable chronic illness - Taking lipitor 40 mg QD, Coreg 12.5 BID, has PRN SL NTG. - Treat with DAPT after stent, now on Plavix daily    NAFLD (nonalcoholic fatty liver disease) Assessment & Plan: Assessment/Plan: Chronic illness with exacerbation  Hepatic steatosis on previous CT abdomen and pelvis 2016.  Patient demonstrates knowledge of health diet and tells me she had success with diet previously.  At times she lacks motivation and will gain weight. Patient has type 2 diabetes and does not drink alcohol.  Review of labs show mildly elevated ALT 7  months ago.  She is currently on metformin 1000 mg daily and glipizide.  We are titrating up metformin and plans to add a GLP-1 once patient is stable on metformin.    Type 2 diabetes mellitus with obesity (HCC) Assessment & Plan: Hgb A1c 7 % 3 month ago. Current A1c 8.3%. The patient denied polyuria and polydipsia.   Plan: Continue Metformin, Ramp up to 1000 mg BID over the next 2 weeks Continue Glipizide for now Follow up in 4 weeks and we will start GLP-1 and stop Glipizide Continue to work on making healthy food choices and increase activity level as tolerable with knee pain   Orders: -  Hemoglobin A1c  Chronic, continuous use of opioids Assessment & Plan: Patient on chronic opioids for chronic left knee pain.She had total knee replacement in 2019 but has continued to have knee pain. Orthopedist is prescriber for pain medications. She remains active and working as Therapist, sports with current medications.   Loud snoring Assessment & Plan: Assessment/Plan: Acute illness with systemic symptom Patient has stop bang score of 6 , she is high risk for moderate to severe sleep apnea. She feels fatigued throughout day, snores loudly, and has apneic spells. Mallampati 4 on exam. - Referral for sleep study  Orders: -     Split night study  Primary hypertension Assessment & Plan: Patient's BP today is 139/82 with a goal of <130/80 in setting of T2DM. The patient endorses adherence to Coreg and Losartan. No lightheaded feeling or dizziness upon standing.   Assessment/Plan: Stable chronic illness Above goal today, but at goal on ambulatory monitoring. No changes to regimen today. -BMP today - Continue Losartan 50 mg QD - Continue Coreg 12.5 BID         Lorene Dy, MD

## 2022-06-14 NOTE — Patient Instructions (Signed)
Thank you, Ms.Rosaura Carpenter for allowing [place] to provide your care today.   I have ordered the following labs for you:  Lab Orders         Hemoglobin A1C         CBC with Differential/Platelet         Vitamin D (25 hydroxy)         BMP8+EGFR        Reminders: I have placed an order and they will contact you to set up your sleep study.  Increase your metformin by 500 mg/week for the next 2 weeks.  Follow-up in 4 weeks for [place] to discuss additional medications.    Tamsen Snider, M.D.

## 2022-06-15 DIAGNOSIS — Z0001 Encounter for general adult medical examination with abnormal findings: Secondary | ICD-10-CM | POA: Insufficient documentation

## 2022-06-15 DIAGNOSIS — E785 Hyperlipidemia, unspecified: Secondary | ICD-10-CM | POA: Insufficient documentation

## 2022-06-15 DIAGNOSIS — R0683 Snoring: Secondary | ICD-10-CM | POA: Insufficient documentation

## 2022-06-15 DIAGNOSIS — F1190 Opioid use, unspecified, uncomplicated: Secondary | ICD-10-CM | POA: Insufficient documentation

## 2022-06-15 LAB — CBC WITH DIFFERENTIAL/PLATELET
Basophils Absolute: 0.1 10*3/uL (ref 0.0–0.2)
Basos: 1 %
EOS (ABSOLUTE): 0.4 10*3/uL (ref 0.0–0.4)
Eos: 3 %
Hematocrit: 40.1 % (ref 34.0–46.6)
Hemoglobin: 13.2 g/dL (ref 11.1–15.9)
Immature Grans (Abs): 0.1 10*3/uL (ref 0.0–0.1)
Immature Granulocytes: 1 %
Lymphocytes Absolute: 5.1 10*3/uL — ABNORMAL HIGH (ref 0.7–3.1)
Lymphs: 42 %
MCH: 28.9 pg (ref 26.6–33.0)
MCHC: 32.9 g/dL (ref 31.5–35.7)
MCV: 88 fL (ref 79–97)
Monocytes Absolute: 0.7 10*3/uL (ref 0.1–0.9)
Monocytes: 5 %
Neutrophils Absolute: 5.8 10*3/uL (ref 1.4–7.0)
Neutrophils: 48 %
Platelets: 355 10*3/uL (ref 150–450)
RBC: 4.56 x10E6/uL (ref 3.77–5.28)
RDW: 13.1 % (ref 11.7–15.4)
WBC: 12 10*3/uL — ABNORMAL HIGH (ref 3.4–10.8)

## 2022-06-15 LAB — BMP8+EGFR
BUN/Creatinine Ratio: 28 — ABNORMAL HIGH (ref 9–23)
BUN: 17 mg/dL (ref 6–24)
CO2: 21 mmol/L (ref 20–29)
Calcium: 9.7 mg/dL (ref 8.7–10.2)
Chloride: 102 mmol/L (ref 96–106)
Creatinine, Ser: 0.61 mg/dL (ref 0.57–1.00)
Glucose: 177 mg/dL — ABNORMAL HIGH (ref 70–99)
Potassium: 4.5 mmol/L (ref 3.5–5.2)
Sodium: 138 mmol/L (ref 134–144)
eGFR: 109 mL/min/{1.73_m2} (ref >59)

## 2022-06-15 LAB — VITAMIN D 25 HYDROXY (VIT D DEFICIENCY, FRACTURES): Vit D, 25-Hydroxy: 30.2 ng/mL (ref 30.0–100.0)

## 2022-06-15 LAB — HEMOGLOBIN A1C
Est. average glucose Bld gHb Est-mCnc: 192 mg/dL
Hgb A1c MFr Bld: 8.3 % — ABNORMAL HIGH (ref 4.8–5.6)

## 2022-06-15 NOTE — Assessment & Plan Note (Signed)
On atorvastatin 40 mg for secondary prevention. LDL 67 in 2023.  Assessment/Plan: Stable chronic illness - Continue atorvastatin 40 mg

## 2022-06-15 NOTE — Assessment & Plan Note (Addendum)
Assessment/Plan: Chronic illness with exacerbation  Hepatic steatosis on previous CT abdomen and pelvis 2016.  Patient demonstrates knowledge of health diet and tells me she had success with diet previously.  At times she lacks motivation and will gain weight. Patient has type 2 diabetes and does not drink alcohol.  Review of labs show mildly elevated ALT 7 months ago.  She is currently on metformin 1000 mg daily and glipizide.  We are titrating up metformin and plans to add a GLP-1 once patient is stable on metformin.

## 2022-06-15 NOTE — Assessment & Plan Note (Signed)
Patient's BP today is 139/82 with a goal of <130/80 in setting of T2DM. The patient endorses adherence to Coreg and Losartan. No lightheaded feeling or dizziness upon standing.   Assessment/Plan: Stable chronic illness Above goal today, but at goal on ambulatory monitoring. No changes to regimen today. -BMP today - Continue Losartan 50 mg QD - Continue Coreg 12.5 BID

## 2022-06-15 NOTE — Assessment & Plan Note (Signed)
Patient on chronic opioids for chronic left knee pain.She had total knee replacement in 2019 but has continued to have knee pain. Orthopedist is prescriber for pain medications. She remains active and working as Therapist, sports with current medications.

## 2022-06-15 NOTE — Assessment & Plan Note (Addendum)
Assessment/Plan: Acute illness with systemic symptom Patient has stop bang score of 6 , she is high risk for moderate to severe sleep apnea. She feels fatigued throughout day, snores loudly, and has apneic spells. Mallampati 4 on exam. - Referral for sleep study

## 2022-06-15 NOTE — Assessment & Plan Note (Signed)
Assessment/Plan: Stable chronic illness - Taking lipitor 40 mg QD, Coreg 12.5 BID, has PRN SL NTG. - Treat with DAPT after stent, now on Plavix daily

## 2022-06-15 NOTE — Assessment & Plan Note (Signed)
Hgb A1c 7 % 3 month ago. Current A1c 8.3%. The patient denied polyuria and polydipsia.   Plan: Continue Metformin, Ramp up to 1000 mg BID over the next 2 weeks Continue Glipizide for now Follow up in 4 weeks and we will start GLP-1 and stop Glipizide Continue to work on making healthy food choices and increase activity level as tolerable with knee pain

## 2022-06-22 ENCOUNTER — Ambulatory Visit: Payer: 59 | Admitting: Family Medicine

## 2022-06-23 ENCOUNTER — Encounter: Payer: Self-pay | Admitting: Radiology

## 2022-07-04 ENCOUNTER — Ambulatory Visit: Payer: 59 | Admitting: Nurse Practitioner

## 2022-07-08 ENCOUNTER — Other Ambulatory Visit: Payer: Self-pay | Admitting: Cardiology

## 2022-07-12 ENCOUNTER — Encounter: Payer: Self-pay | Admitting: Internal Medicine

## 2022-07-12 ENCOUNTER — Ambulatory Visit: Payer: 59 | Admitting: Internal Medicine

## 2022-07-12 VITALS — BP 127/81 | HR 86 | Temp 98.1°F | Resp 16 | Ht 65.0 in | Wt 222.0 lb

## 2022-07-12 DIAGNOSIS — E1169 Type 2 diabetes mellitus with other specified complication: Secondary | ICD-10-CM

## 2022-07-12 DIAGNOSIS — H66001 Acute suppurative otitis media without spontaneous rupture of ear drum, right ear: Secondary | ICD-10-CM

## 2022-07-12 DIAGNOSIS — J020 Streptococcal pharyngitis: Secondary | ICD-10-CM | POA: Diagnosis not present

## 2022-07-12 DIAGNOSIS — R051 Acute cough: Secondary | ICD-10-CM | POA: Diagnosis not present

## 2022-07-12 DIAGNOSIS — J029 Acute pharyngitis, unspecified: Secondary | ICD-10-CM

## 2022-07-12 DIAGNOSIS — E669 Obesity, unspecified: Secondary | ICD-10-CM

## 2022-07-12 LAB — POCT RAPID STREP A (OFFICE): Rapid Strep A Screen: POSITIVE — AB

## 2022-07-12 MED ORDER — OZEMPIC (0.25 OR 0.5 MG/DOSE) 2 MG/3ML ~~LOC~~ SOPN: 0.2500 mg | PEN_INJECTOR | SUBCUTANEOUS | 0 refills | Status: DC

## 2022-07-12 MED ORDER — AMOXICILLIN-POT CLAVULANATE 875-125 MG PO TABS: 1.0000 | ORAL_TABLET | Freq: Two times a day (BID) | ORAL | 0 refills | Status: DC

## 2022-07-12 NOTE — Assessment & Plan Note (Signed)
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

## 2022-07-12 NOTE — Assessment & Plan Note (Signed)
Positive point-of-care strep test Take Augmentin as prescribed

## 2022-07-12 NOTE — Assessment & Plan Note (Signed)
Patient tolerated ramping up metformin, continue 1000 mg twice daily Stop glipizide Start Ozempic .25 mg weekly when patient has recovered from acute illness , strep pharyngitis and otitis media, follow-up in 4 weeks to discuss increasing dose of Ozempic.

## 2022-07-12 NOTE — Patient Instructions (Signed)
Thank you, Ms.Rosaura Carpenter for allowing [place] to provide your care today.   You have [employer], antibiotic sent to your pharmacy. We will viral test you , so we can give recommendations if you need to avoid others for longer.  Start Ozempic after you feel better. Stop Glipizide.   Follow up in 4 weeks.   Tamsen Snider, M.D.

## 2022-07-12 NOTE — Progress Notes (Signed)
   HPI:[name] is a 52 y.o. female who presents for evaluation of T2DM and acute illness.  Patient woke up on Sunday with sore throat, fatigue, right ear pain, cough, and malaise.  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  She has increased her metformin to 1000 mg twice daily and tolerating this medication.    Physical Exam: Vitals:   07/12/22 0810  BP: 127/81  Pulse: 86  Resp: 16  Temp: 98.1 F (36.7 C)  TempSrc: Oral  SpO2: 93%  Weight: 222 lb (100.7 kg)  Height: 5\' 5"  (1.651 m)     Physical Exam Constitutional:      General: She is not in acute distress.    Appearance: She is ill-appearing.  HENT:     Right Ear: Ear canal normal. A middle ear effusion is present.     Left Ear: Ear canal normal.  No middle ear effusion.     Mouth/Throat:     Mouth: Mucous membranes are moist.     Pharynx: Posterior oropharyngeal erythema present.     Comments: Mallampati 3, cannot see if patient has exudate Cardiovascular:     Rate and Rhythm: Normal rate and regular rhythm.     Heart sounds: No murmur heard. Pulmonary:     Effort: Pulmonary effort is normal. No respiratory distress.     Breath sounds: No wheezing.      Assessment & Plan:   [name] was seen today for sore throat.  Non-recurrent acute suppurative otitis media of right ear without spontaneous rupture of tympanic membrane -     Amoxicillin-Pot Clavulanate; Take 1 tablet by mouth 2 (two) times daily.  Dispense: 14 tablet; Refill: 0  Type 2 diabetes mellitus with obesity ([name]) Assessment & Plan: Patient tolerated ramping up metformin, continue 1000 mg twice daily Stop glipizide Start Ozempic .25 mg weekly when patient has recovered from acute illness , strep pharyngitis and otitis media, follow-up in 4 weeks to discuss increasing dose of Ozempic.   Orders: -     Ozempic (0.25 or 0.5 MG/DOSE); Inject 0.25 mg into the skin once a week.  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 Can not visualize posterior pharynx.  Point-of-care rapid strep a  Orders: -     POCT rapid strep A  Strep pharyngitis Assessment & Plan: Positive point-of-care strep test Take Augmentin as prescribed        Lorene Dy, MD

## 2022-07-12 NOTE — Assessment & Plan Note (Signed)
Presents with sore throat. Can not visualize posterior pharynx.  Point-of-care rapid strep a

## 2022-07-14 ENCOUNTER — Other Ambulatory Visit: Payer: Self-pay | Admitting: Orthopedic Surgery

## 2022-07-14 ENCOUNTER — Other Ambulatory Visit: Payer: Self-pay | Admitting: Radiology

## 2022-07-14 ENCOUNTER — Encounter: Payer: Self-pay | Admitting: Internal Medicine

## 2022-07-14 ENCOUNTER — Encounter: Payer: Self-pay | Admitting: Orthopedic Surgery

## 2022-07-14 DIAGNOSIS — G8929 Other chronic pain: Secondary | ICD-10-CM

## 2022-07-14 DIAGNOSIS — Z96659 Presence of unspecified artificial knee joint: Secondary | ICD-10-CM

## 2022-07-14 DIAGNOSIS — Z96652 Presence of left artificial knee joint: Secondary | ICD-10-CM

## 2022-07-14 LAB — COVID-19, FLU A+B AND RSV
Influenza A, NAA: NOT DETECTED
Influenza B, NAA: NOT DETECTED
RSV, NAA: NOT DETECTED
SARS-CoV-2, NAA: NOT DETECTED

## 2022-07-14 MED ORDER — OXYCODONE HCL 5 MG PO TABS: 5.0000 mg | ORAL_TABLET | ORAL | 0 refills | Status: DC | PRN

## 2022-07-14 NOTE — Telephone Encounter (Signed)
Patient asking for 5 mg tablets

## 2022-07-14 NOTE — Progress Notes (Signed)
Meds ordered this encounter  Medications   oxyCODONE (OXY IR/ROXICODONE) 5 MG immediate release tablet    Sig: Take 1 tablet (5 mg total) by mouth every 4 (four) hours as needed for severe pain.    Dispense:  120 tablet    Refill:  0

## 2022-07-15 ENCOUNTER — Other Ambulatory Visit: Payer: Self-pay | Admitting: Internal Medicine

## 2022-07-18 ENCOUNTER — Other Ambulatory Visit: Payer: Self-pay | Admitting: Internal Medicine

## 2022-07-18 ENCOUNTER — Ambulatory Visit (HOSPITAL_COMMUNITY)
Admission: RE | Admit: 2022-07-18 | Discharge: 2022-07-18 | Disposition: A | Discharge status: Home / Self Care | Payer: 59 | Source: Ambulatory Visit | Attending: Internal Medicine | Admitting: Internal Medicine

## 2022-07-18 ENCOUNTER — Telehealth: Payer: Self-pay | Admitting: Internal Medicine

## 2022-07-18 ENCOUNTER — Encounter: Payer: Self-pay | Admitting: Internal Medicine

## 2022-07-18 DIAGNOSIS — J302 Other seasonal allergic rhinitis: Secondary | ICD-10-CM

## 2022-07-18 DIAGNOSIS — R051 Acute cough: Secondary | ICD-10-CM | POA: Insufficient documentation

## 2022-07-18 DIAGNOSIS — E1169 Type 2 diabetes mellitus with other specified complication: Secondary | ICD-10-CM

## 2022-07-18 DIAGNOSIS — R0602 Shortness of breath: Secondary | ICD-10-CM | POA: Diagnosis not present

## 2022-07-18 DIAGNOSIS — R059 Cough, unspecified: Secondary | ICD-10-CM | POA: Diagnosis not present

## 2022-07-18 MED ORDER — VICTOZA 18 MG/3ML ~~LOC~~ SOPN: PEN_INJECTOR | SUBCUTANEOUS | 0 refills | Status: DC

## 2022-07-18 MED ORDER — CETIRIZINE HCL 10 MG PO TABS: 10.0000 mg | ORAL_TABLET | Freq: Every day | ORAL | 1 refills | Status: DC

## 2022-07-18 MED ORDER — FLUTICASONE PROPIONATE 50 MCG/ACT NA SUSP: 2.0000 | Freq: Every day | NASAL | 1 refills | Status: DC

## 2022-07-18 NOTE — Progress Notes (Signed)
Patients acute cough has not improved. Negative for RSV, Covid, and Flu. Has taken Augmentin for 7 days. Will send for chest xray.

## 2022-07-18 NOTE — Telephone Encounter (Signed)
Pt called stating she is not any better. Wants to know what to do?

## 2022-07-18 NOTE — Telephone Encounter (Signed)
Still feeling bad. Ear better but a lot of dry hacking cough and head stopped up. Doesn't think she's had fever. Sinus congestion clear. Has one more antibiotic. Wants to know what she needs to do

## 2022-07-20 ENCOUNTER — Other Ambulatory Visit: Payer: Self-pay | Admitting: Cardiology

## 2022-07-20 ENCOUNTER — Encounter: Payer: Self-pay | Admitting: Internal Medicine

## 2022-07-21 ENCOUNTER — Encounter: Payer: Self-pay | Admitting: Internal Medicine

## 2022-07-21 ENCOUNTER — Telehealth: Payer: 59 | Admitting: Internal Medicine

## 2022-07-21 DIAGNOSIS — J40 Bronchitis, not specified as acute or chronic: Secondary | ICD-10-CM | POA: Diagnosis not present

## 2022-07-21 DIAGNOSIS — J020 Streptococcal pharyngitis: Secondary | ICD-10-CM | POA: Diagnosis not present

## 2022-07-21 MED ORDER — PREDNISONE 20 MG PO TABS: 20.0000 mg | ORAL_TABLET | Freq: Every day | ORAL | 0 refills | Status: AC

## 2022-07-21 MED ORDER — LIDOCAINE VISCOUS HCL 2 % MT SOLN: 15.0000 mL | OROMUCOSAL | 0 refills | Status: DC | PRN

## 2022-07-21 MED ORDER — AMOXICILLIN-POT CLAVULANATE 875-125 MG PO TABS: 1.0000 | ORAL_TABLET | Freq: Two times a day (BID) | ORAL | 0 refills | Status: AC

## 2022-07-21 NOTE — Telephone Encounter (Signed)
scheduled

## 2022-07-21 NOTE — Assessment & Plan Note (Signed)
Rapid strep test positive on 3/19.  She was treated with Augmentin x 7 days.  She continues to endorse sore throat, noting that her symptoms transiently improved but worsened earlier this week. -Augmentin prescribed for an additional 5 days as she completed 7 days.  Treatment duration can be extended given her persistent symptoms. -Add viscous lidocaine

## 2022-07-21 NOTE — Assessment & Plan Note (Signed)
She endorses a persistent, nonproductive cough.  Chest x-ray earlier this week was negative for focal infiltrate. -Prednisone 20 mg x 5 days prescribed

## 2022-07-21 NOTE — Progress Notes (Signed)
Virtual Visit via Video Note  I connected with [name] on 07/21/22 at  4:40 PM EDT by a video enabled telemedicine application and verified that I am speaking with the correct person using two identifiers.  Patient Location: Home Provider Location: Office/Clinic  I discussed the limitations, risks, security, and privacy concerns of performing an evaluation and management service by video and the availability of in person appointments. I also discussed with the patient that there may be a patient responsible charge related to this service. The patient expressed understanding and agreed to proceed.  Subjective: PCP: Johnette Abraham, MD  Chief Complaint  Patient presents with   sinus congestion     Still having a lot of head congestion and throat has started to hurt again. Finished antibiotic and started zyrtec and flonase as directed by Court Joy with no improvement.    [name] has been evaluated through an acute video encounter for persistent symptoms of sinus congestion and sore throat.  She was last evaluated on 3/19 by Dr. Court Joy at which time she endorsed symptoms of sore throat, fatigue, right ear pain, and a nonproductive cough.  Rapid strep testing was positive.  She was treated with Augmentin x 7 days for treatment of strep pharyngitis and acute otitis media of the right ear.  She continued to experience a persistent cough and a chest x-ray was obtained earlier this week that was subsequently normal.  Viral testing has previously been negative.  Today [name] continues to endorse a sore throat, reporting that it temporarily improved, however is now sore like it was a week ago.  She continues to endorse fullness in her ears as well as a nonproductive cough.  She is interested in additional treatment options.  ROS: Per HPI  Current Outpatient Medications:    amoxicillin-clavulanate (AUGMENTIN) 875-125 MG tablet, Take 1 tablet by mouth 2 (two) times daily for 5 days., Disp: 10  tablet, Rfl: 0   atorvastatin (LIPITOR) 40 MG tablet, Take 1 tablet by mouth once daily, Disp: 90 tablet, Rfl: 3   carvedilol (COREG) 12.5 MG tablet, Take 1 tablet by mouth twice daily, Disp: 180 tablet, Rfl: 1   cetirizine (ZYRTEC ALLERGY) 10 MG tablet, Take 1 tablet (10 mg total) by mouth daily., Disp: 60 tablet, Rfl: 1   clopidogrel (PLAVIX) 75 MG tablet, Take 1 tablet by mouth once daily, Disp: 90 tablet, Rfl: 3   docusate sodium (COLACE) 100 MG capsule, Take 1 capsule (100 mg total) by mouth 2 (two) times daily., Disp: 10 capsule, Rfl: 0   fluticasone (FLONASE) 50 MCG/ACT nasal spray, Place 2 sprays into both nostrils daily., Disp: 15.8 mL, Rfl: 1   glucose blood (ONETOUCH VERIO) test strip, Use as instructed to monitor blood glucose once daily., Disp: 100 each, Rfl: 12   Krill Oil (OMEGA-3) 500 MG CAPS, Take 1,000 mg by mouth daily., Disp: , Rfl:    Lancets (ONETOUCH DELICA PLUS 123XX123) MISC, by Does not apply route., Disp: , Rfl:    lidocaine (XYLOCAINE) 2 % solution, Use as directed 15 mLs in the mouth or throat as needed for mouth pain., Disp: 100 mL, Rfl: 0   liraglutide (VICTOZA) 18 MG/3ML SOPN, Start 0.6mg  SQ once a day for 7 days, then increase to 1.2mg  once a day, Disp: 6 mL, Rfl: 0   losartan (COZAAR) 50 MG tablet, Take 1 tablet by mouth once daily, Disp: 90 tablet, Rfl: 3   metFORMIN (GLUCOPHAGE-XR) 500 MG 24 hr tablet, Take  2 tablets (1,000 mg total) by mouth daily with breakfast., Disp: 180 tablet, Rfl: 3   nitroGLYCERIN (NITROSTAT) 0.4 MG SL tablet, PLACE 1 TABLET (0.4 MG TOTAL) UNDER THE TONGUE EVERY 5 (FIVE) MINUTES AS NEEDED FOR CHEST PAIN. (Patient taking differently: Place 0.4 mg under the tongue every 5 (five) minutes as needed for chest pain.), Disp: 25 tablet, Rfl: 12   oxyCODONE (OXY IR/ROXICODONE) 5 MG immediate release tablet, Take 1 tablet (5 mg total) by mouth every 4 (four) hours as needed for severe pain., Disp: 120 tablet, Rfl: 0   predniSONE (DELTASONE) 20 MG  tablet, Take 1 tablet (20 mg total) by mouth daily with breakfast for 5 days., Disp: 5 tablet, Rfl: 0   promethazine (PHENERGAN) 12.5 MG tablet, TAKE 1 TABLET BY MOUTH EVERY 4 HOURS AS NEEDED FOR NAUSEA FOR VOMITING, Disp: 42 tablet, Rfl: 0   Vitamin D, Ergocalciferol, (DRISDOL) 1.25 MG (50000 UNIT) CAPS capsule, Take 1 capsule (50,000 Units total) by mouth every 7 (seven) days., Disp: 12 capsule, Rfl: 1  Assessment and Plan: Strep pharyngitis Assessment & Plan: Rapid strep test positive on 3/19.  She was treated with Augmentin x 7 days.  She continues to endorse sore throat, noting that her symptoms transiently improved but worsened earlier this week. -Augmentin prescribed for an additional 5 days as she completed 7 days.  Treatment duration can be extended given her persistent symptoms. -Add viscous lidocaine  Bronchitis Assessment & Plan: She endorses a persistent, nonproductive cough.  Chest x-ray earlier this week was negative for focal infiltrate. -Prednisone 20 mg x 5 days prescribed  Follow Up Instructions: Return if symptoms worsen or fail to improve.   I discussed the assessment and treatment plan with the patient. The patient was provided an opportunity to ask questions, and all were answered. The patient agreed with the plan and demonstrated an understanding of the instructions.   The patient was advised to call back or seek an in-person evaluation if the symptoms worsen or if the condition fails to improve as anticipated.  The above assessment and management plan was discussed with the patient. The patient verbalized understanding of and has agreed to the management plan.   Johnette Abraham, MD

## 2022-07-22 ENCOUNTER — Encounter: Payer: Self-pay | Admitting: Internal Medicine

## 2022-07-25 ENCOUNTER — Encounter: Payer: Self-pay | Admitting: Internal Medicine

## 2022-07-26 ENCOUNTER — Other Ambulatory Visit: Payer: Self-pay

## 2022-07-26 DIAGNOSIS — E1165 Type 2 diabetes mellitus with hyperglycemia: Secondary | ICD-10-CM

## 2022-07-26 MED ORDER — METFORMIN HCL ER 500 MG PO TB24: 1000.0000 mg | ORAL_TABLET | Freq: Every day | ORAL | 3 refills | Status: DC

## 2022-07-28 ENCOUNTER — Encounter: Payer: Self-pay | Admitting: Internal Medicine

## 2022-07-29 ENCOUNTER — Encounter: Payer: Self-pay | Admitting: Family Medicine

## 2022-07-29 ENCOUNTER — Ambulatory Visit: Payer: 59 | Admitting: Family Medicine

## 2022-07-29 VITALS — BP 128/87 | HR 88 | Ht 65.0 in | Wt 220.0 lb

## 2022-07-29 DIAGNOSIS — J069 Acute upper respiratory infection, unspecified: Secondary | ICD-10-CM | POA: Insufficient documentation

## 2022-07-29 MED ORDER — AZITHROMYCIN 250 MG PO TABS: ORAL_TABLET | ORAL | 0 refills | Status: DC

## 2022-07-29 NOTE — Patient Instructions (Signed)
It was pleasure meeting with you today. Please take medications as prescribed. Follow up with your primary health provider if any health concerns arises. If symptoms worsen please contact your primary care provider and/or visit the emergency department.  

## 2022-07-29 NOTE — Assessment & Plan Note (Signed)
Trial Azithromycin 250 mg x 5 days Prior Xray negative Discussed symptomatic treatment, rest, increase oral fluid intake.Follow-up for worsening or persistent symptoms. Patient verbalizes understanding regarding plan of care and all questions answered

## 2022-07-29 NOTE — Progress Notes (Signed)
Patient Office Visit   Subjective   Patient ID: [name], female    DOB: 11-04-1970  Age: 52 y.o. MRN: [id number]  CC:  Chief Complaint  Patient presents with   Cough    Patient complains of cough, sore throat, congestion for 3 weeks. Had strep throat and took antibiotics but throat started hurting again when finished.     HPI [name] 51 year old female, presents to clinic for continued symptoms of cough and sore throat for the past 3 weeks. She  has a past medical history of Arthritis, Carpal tunnel syndrome, bilateral, Coronary artery disease, Diabetes mellitus without complication, Diverticulitis, Hyperlipidemia, Hypertension, Hypertriglyceridemia, Myocardial infarction, PONV (postoperative nausea and vomiting), and Sciatica.  Patient complains of cough. Patient describes symptoms of shortness of breath on exertion, malaise, sore throat, and sputum production. Symptoms began a few weeks ago and are gradually worsening since that time. Patient denies chest pain or nausea and vomiting. Treatment thus far includes OTC analgesics/antipyretics antibiotics, treatment has been not effective. Past pulmonary history is significant for no history of pneumonia    Outpatient Encounter Medications as of 07/29/2022  Medication Sig   atorvastatin (LIPITOR) 40 MG tablet Take 1 tablet by mouth once daily   azithromycin (ZITHROMAX) 250 MG tablet Take 2 tablets on day 1, then 1 tablet daily on days 2 through 5   carvedilol (COREG) 12.5 MG tablet Take 1 tablet by mouth twice daily   cetirizine (ZYRTEC ALLERGY) 10 MG tablet Take 1 tablet (10 mg total) by mouth daily.   clopidogrel (PLAVIX) 75 MG tablet Take 1 tablet by mouth once daily   docusate sodium (COLACE) 100 MG capsule Take 1 capsule (100 mg total) by mouth 2 (two) times daily.   fluticasone (FLONASE) 50 MCG/ACT nasal spray Place 2 sprays into both nostrils daily.   glucose blood (ONETOUCH VERIO) test strip Use as instructed to monitor  blood glucose once daily.   Krill Oil (OMEGA-3) 500 MG CAPS Take 1,000 mg by mouth daily.   Lancets (ONETOUCH DELICA PLUS LANCET33G) MISC by Does not apply route.   lidocaine (XYLOCAINE) 2 % solution Use as directed 15 mLs in the mouth or throat as needed for mouth pain.   liraglutide (VICTOZA) 18 MG/3ML SOPN Start 0.6mg  SQ once a day for 7 days, then increase to 1.2mg  once a day   losartan (COZAAR) 50 MG tablet Take 1 tablet by mouth once daily   metFORMIN (GLUCOPHAGE-XR) 500 MG 24 hr tablet Take 2 tablets (1,000 mg total) by mouth daily with breakfast.   nitroGLYCERIN (NITROSTAT) 0.4 MG SL tablet PLACE 1 TABLET (0.4 MG TOTAL) UNDER THE TONGUE EVERY 5 (FIVE) MINUTES AS NEEDED FOR CHEST PAIN. (Patient taking differently: Place 0.4 mg under the tongue every 5 (five) minutes as needed for chest pain.)   oxyCODONE (OXY IR/ROXICODONE) 5 MG immediate release tablet Take 1 tablet (5 mg total) by mouth every 4 (four) hours as needed for severe pain.   promethazine (PHENERGAN) 12.5 MG tablet TAKE 1 TABLET BY MOUTH EVERY 4 HOURS AS NEEDED FOR NAUSEA FOR VOMITING   Vitamin D, Ergocalciferol, (DRISDOL) 1.25 MG (50000 UNIT) CAPS capsule Take 1 capsule (50,000 Units total) by mouth every 7 (seven) days.   [DISCONTINUED] pantoprazole (PROTONIX) 40 MG tablet Take 1 tablet (40 mg total) by mouth daily. Further refills by PCP (Patient not taking: Reported on 05/18/2020)   No facility-administered encounter medications on file as of 07/29/2022.    Past Surgical History:  Procedure Laterality Date   ablasion of uterus     CARDIAC CATHETERIZATION     CARPAL TUNNEL RELEASE  11/10/2010   Procedure: CARPAL TUNNEL RELEASE;  Surgeon: Fuller CanadaStanley Harrison, MD;  Location: [facility];  Service: Orthopedics;  Laterality: Right;   CHOLECYSTECTOMY     [facility]   COLON RESECTION N/A 04/08/2015   Procedure: LAPAROSCOPIC HAND ASSISTED PARTIAL COLECTOMY  CONVERTED TO OPEN AT [id number];  Surgeon: Franky MachoMark Jenkins, MD;  Location: [facility];  Service:  General;  Laterality: N/A;   COLONOSCOPY N/A 03/28/2014   Procedure: COLONOSCOPY;  Surgeon: Malissa HippoNajeeb U Rehman, MD;  Location: [facility];  Service: Endoscopy;  Laterality: N/A;  730   CORONARY STENT INTERVENTION  04/07/2020   CORONARY STENT INTERVENTION N/A 04/07/2020   Procedure: CORONARY STENT INTERVENTION;  Surgeon: Corky CraftsVaranasi, Jayadeep S, MD;  Location: [facility];  Service: Cardiovascular;  Laterality: N/A;   JOINT REPLACEMENT N/A    [employer] 08/10/2020   KNEE ARTHROSCOPY WITH DRILLING/MICROFRACTURE Left 10/20/2016   Procedure: LEFT KNEE ARTHROSCOPY WITH MICROFRACTURE;  Surgeon: Vickki HearingHarrison, Stanley E, MD;  Location: [facility];  Service: Orthopedics;  Laterality: Left;   KNEE ARTHROSCOPY WITH DRILLING/MICROFRACTURE Left 04/27/2017   Procedure: KNEE ARTHROSCOPY WITH DRILLING/MICROFRACTURE;  Surgeon: Vickki HearingHarrison, Stanley E, MD;  Location: [facility];  Service: Orthopedics;  Laterality: Left;   KNEE ARTHROSCOPY WITH OSTEOCHONDRAL DEFECT REPAIR Left 04/27/2017   Procedure: KNEE ARTHROSCOPY WITH OSTEOCHONDRAL DEFECT REPAIR Autograft;  Surgeon: Vickki HearingHarrison, Stanley E, MD;  Location: [facility];  Service: Orthopedics;  Laterality: Left;   KNEE SURGERY     LEFT HEART CATH AND CORONARY ANGIOGRAPHY N/A 04/07/2020   Procedure: LEFT HEART CATH AND CORONARY ANGIOGRAPHY;  Surgeon: Corky CraftsVaranasi, Jayadeep S, MD;  [facility];  Service: Cardiovascular;  Laterality: N/A;   PARTIAL COLECTOMY N/A 04/08/2015   Procedure: PARTIAL COLECTOMY;  Surgeon: Franky MachoMark Jenkins, MD;  Location: [facility];  Service: General;  Laterality: N/A;   right knee arthroscopy     SMALL INTESTINE SURGERY N/A    [employer] 08/10/2020   TOTAL KNEE ARTHROPLASTY Left 10/17/2017   Procedure: TOTAL KNEE ARTHROPLASTY;  Surgeon: Vickki HearingHarrison, Stanley E, MD;  Location: [facility];  Service: Orthopedics;  Laterality: Left;  [employer]    TOTAL KNEE REVISION Left 10/20/2020   Procedure: REVISION TIBIAL COMPONENT LEFT TOTAL KNEE;  Surgeon: Vickki HearingHarrison, Stanley E, MD;   Location: [facility];  Service: Orthopedics;  Laterality: Left;   TUBAL LIGATION      Review of Systems  Constitutional:  Negative for chills and fever.  HENT:  Positive for ear pain and sore throat. Negative for congestion, sinus pain and tinnitus.   Eyes:  Negative for blurred vision.  Respiratory:  Positive for cough, sputum production and shortness of breath. Negative for hemoptysis and wheezing.   Cardiovascular:  Negative for chest pain.  Gastrointestinal:  Negative for abdominal pain.  Neurological:  Negative for dizziness and headaches.      Objective    BP 128/87   Pulse 88   Ht 5\' 5"  (1.651 m)   Wt 220 lb (99.8 kg)   SpO2 97%   BMI 36.61 kg/m   Physical Exam Vitals reviewed.  Constitutional:      General: She is not in acute distress.    Appearance: Normal appearance. She is not ill-appearing, toxic-appearing or diaphoretic.  HENT:     Head: Normocephalic.     Right Ear: Tympanic membrane normal.     Left Ear: Tympanic membrane normal.  Nose: Nose normal.     Mouth/Throat:     Mouth: Mucous membranes are moist.  Eyes:     General:        Right eye: No discharge.        Left eye: No discharge.     Conjunctiva/sclera: Conjunctivae normal.  Cardiovascular:     Rate and Rhythm: Normal rate and regular rhythm.     Pulses: Normal pulses.     Heart sounds: Normal heart sounds.  Pulmonary:     Effort: Pulmonary effort is normal. No respiratory distress.     Breath sounds: Normal breath sounds.  Musculoskeletal:        General: Normal range of motion.     Cervical back: Normal range of motion.  Skin:    General: Skin is warm and dry.  Neurological:     General: No focal deficit present.     Mental Status: She is alert and oriented to person, place, and time. Mental status is at baseline.  Psychiatric:        Mood and Affect: Mood normal.        Behavior: Behavior normal.        Thought Content: Thought content normal.        Judgment: Judgment normal.        Assessment & Plan:  Upper respiratory tract infection, unspecified type Assessment & Plan: Trial Azithromycin 250 mg x 5 days Prior Xray negative Discussed symptomatic treatment, rest, increase oral fluid intake.Follow-up for worsening or persistent symptoms. Patient verbalizes understanding regarding plan of care and all questions answered   Orders: -     Azithromycin; Take 2 tablets on day 1, then 1 tablet daily on days 2 through 5  Dispense: 6 tablet; Refill: 0    No follow-ups on file.   Cruzita LedererIliana Del Newman Niprbe Polanco, FNP

## 2022-08-12 ENCOUNTER — Encounter: Payer: Self-pay | Admitting: Internal Medicine

## 2022-08-12 ENCOUNTER — Ambulatory Visit: Payer: 59 | Admitting: Internal Medicine

## 2022-08-12 ENCOUNTER — Encounter: Payer: Self-pay | Admitting: Orthopedic Surgery

## 2022-08-12 VITALS — BP 133/69 | HR 80 | Ht 65.0 in | Wt 222.0 lb

## 2022-08-12 DIAGNOSIS — E1165 Type 2 diabetes mellitus with hyperglycemia: Secondary | ICD-10-CM | POA: Diagnosis not present

## 2022-08-12 DIAGNOSIS — E669 Obesity, unspecified: Secondary | ICD-10-CM | POA: Diagnosis not present

## 2022-08-12 DIAGNOSIS — E1169 Type 2 diabetes mellitus with other specified complication: Secondary | ICD-10-CM

## 2022-08-12 DIAGNOSIS — K760 Fatty (change of) liver, not elsewhere classified: Secondary | ICD-10-CM

## 2022-08-12 MED ORDER — METFORMIN HCL ER 500 MG PO TB24: 1000.0000 mg | ORAL_TABLET | Freq: Two times a day (BID) | ORAL | 3 refills | Status: DC

## 2022-08-12 MED ORDER — TIRZEPATIDE 2.5 MG/0.5ML ~~LOC~~ SOAJ: 2.5000 mg | SUBCUTANEOUS | 0 refills | Status: DC

## 2022-08-12 NOTE — Patient Instructions (Signed)
It was a pleasure to see you today.  Thank you for giving [place] the opportunity to be involved in your care.  Below is a brief recap of your visit and next steps.  We will plan to see you again in 1 ,month.  Summary Start Mounjaro 2.5 mg weekly Stop glipizide after starting Mounjaro Follow up in 1 month

## 2022-08-12 NOTE — Progress Notes (Unsigned)
Established Patient Office Visit  Subjective   Patient ID: [name], female    DOB: 1970-12-29  Age: 52 y.o. MRN: [id number]  Chief Complaint  Patient presents with   Diabetes    Follow up   [name] returns to care today for DM follow-up.  She was last evaluated at [facility] on 4/5 for URI symptoms.  Previously evaluated by me on 3/28 with symptoms concerning for bronchitis, and previously evaluated on 3/19 by Dr. Barbaraann Faster at which time glipizide was stopped and Ozempic was initiated.  There have otherwise been no acute interval events.  [name] reports feeling well today.  She is asymptomatic currently and has no additional concerns to discuss.  Past Medical History:  Diagnosis Date   Arthritis    Carpal tunnel syndrome, bilateral    Coronary artery disease    Diabetes mellitus without complication    no meds; diet controlled   Diverticulitis    Hyperlipidemia    Phreesia 08/10/2020   Hypertension    Hypertriglyceridemia    Myocardial infarction    Phreesia 08/10/2020   PONV (postoperative nausea and vomiting)    Sciatica    Past Surgical History:  Procedure Laterality Date   ablasion of uterus     CARDIAC CATHETERIZATION     CARPAL TUNNEL RELEASE  11/10/2010   Procedure: CARPAL TUNNEL RELEASE;  Surgeon: Fuller Canada, MD;  Location: [facility];  Service: Orthopedics;  Laterality: Right;   CHOLECYSTECTOMY     [facility]   COLON RESECTION N/A 04/08/2015   Procedure: LAPAROSCOPIC HAND ASSISTED PARTIAL COLECTOMY  CONVERTED TO OPEN AT 4098;  Surgeon: Franky Macho, MD;  Location: [facility];  Service: General;  Laterality: N/A;   COLONOSCOPY N/A 03/28/2014   Procedure: COLONOSCOPY;  Surgeon: Malissa Hippo, MD;  Location: [facility];  Service: Endoscopy;  Laterality: N/A;  730   CORONARY STENT INTERVENTION  04/07/2020   CORONARY STENT INTERVENTION N/A 04/07/2020   Procedure: CORONARY STENT INTERVENTION;  Surgeon: Corky Crafts, MD;  Location: [facility];  Service:  Cardiovascular;  Laterality: N/A;   JOINT REPLACEMENT N/A    Phreesia 08/10/2020   KNEE ARTHROSCOPY WITH DRILLING/MICROFRACTURE Left 10/20/2016   Procedure: LEFT KNEE ARTHROSCOPY WITH MICROFRACTURE;  Surgeon: Vickki Hearing, MD;  Location: [facility];  Service: Orthopedics;  Laterality: Left;   KNEE ARTHROSCOPY WITH DRILLING/MICROFRACTURE Left 04/27/2017   Procedure: KNEE ARTHROSCOPY WITH DRILLING/MICROFRACTURE;  Surgeon: Vickki Hearing, MD;  Location: [facility];  Service: Orthopedics;  Laterality: Left;   KNEE ARTHROSCOPY WITH OSTEOCHONDRAL DEFECT REPAIR Left 04/27/2017   Procedure: KNEE ARTHROSCOPY WITH OSTEOCHONDRAL DEFECT REPAIR Autograft;  Surgeon: Vickki Hearing, MD;  Location: [facility];  Service: Orthopedics;  Laterality: Left;   KNEE SURGERY     LEFT HEART CATH AND CORONARY ANGIOGRAPHY N/A 04/07/2020   Procedure: LEFT HEART CATH AND CORONARY ANGIOGRAPHY;  Surgeon: Corky Crafts, MD;  Location: [facility] INVASIVE CV LAB;  Service: Cardiovascular;  Laterality: N/A;   PARTIAL COLECTOMY N/A 04/08/2015   Procedure: PARTIAL COLECTOMY;  Surgeon: Franky Macho, MD;  Location: [facility];  Service: General;  Laterality: N/A;   right knee arthroscopy     SMALL INTESTINE SURGERY N/A    Phreesia 08/10/2020   TOTAL KNEE ARTHROPLASTY Left 10/17/2017   Procedure: TOTAL KNEE ARTHROPLASTY;  Surgeon: Vickki Hearing, MD;  Location: [facility];  Service: Orthopedics;  Laterality: Left;  DePuy    TOTAL KNEE REVISION Left 10/20/2020   Procedure: REVISION TIBIAL  COMPONENT LEFT TOTAL KNEE;  Surgeon: Vickki Hearing, MD;  Location: [facility];  Service: Orthopedics;  Laterality: Left;   TUBAL LIGATION     Social History   Tobacco Use   Smoking status: Every Day    Packs/day: 1.00    Years: 26.00    Additional pack years: 0.00    Total pack years: 26.00    Types: Cigarettes    Last attempt to quit: 04/05/2020    Years since quitting: 2.3   Smokeless tobacco: Never  Vaping Use   Vaping Use: Never  used  Substance Use Topics   Alcohol use: No    Alcohol/week: 0.0 standard drinks of alcohol   Drug use: No   Family History  Problem Relation Age of Onset   Diabetes type I Mother    Hyperlipidemia Mother    Diabetes type II Father    Hypertension Father    Hyperlipidemia Father    Diabetes type II Brother    Diabetes type II Brother    Cancer Other    Diabetes Other    Arthritis Other    Asthma Other    Anesthesia problems Neg Hx    Allergies  Allergen Reactions   Other    Flagyl [Metronidazole] Nausea Only   Hydrocodone Nausea And Vomiting and Other (See Comments)    Can take need to take with medication for nausea and vomitting   Review of Systems  Constitutional:  Negative for chills and fever.  HENT:  Negative for sore throat.   Respiratory:  Negative for cough and shortness of breath.   Cardiovascular:  Negative for chest pain, palpitations and leg swelling.  Gastrointestinal:  Negative for abdominal pain, blood in stool, constipation, diarrhea, nausea and vomiting.  Genitourinary:  Negative for dysuria and hematuria.  Musculoskeletal:  Negative for myalgias.  Skin:  Negative for itching and rash.  Neurological:  Negative for dizziness and headaches.  Psychiatric/Behavioral:  Negative for depression and suicidal ideas.      Objective:     BP 133/69   Pulse 80   Ht  (1.651 m)   Wt 222 lb (100.7 kg)   SpO2 95%   BMI 36.94 kg/m  BP Readings from Last 3 Encounters:  08/12/22 133/69  07/29/22 128/87  07/12/22 127/81   Physical Exam Vitals reviewed.  Constitutional:      General: She is not in acute distress.    Appearance: Normal appearance. She is obese. She is not toxic-appearing.  HENT:     Head: Normocephalic and atraumatic.     Right Ear: External ear normal.     Left Ear: External ear normal.     Nose: Nose normal. No congestion or rhinorrhea.     Mouth/Throat:     Mouth: Mucous membranes are moist.     Pharynx: Oropharynx is clear. No  oropharyngeal exudate or posterior oropharyngeal erythema.  Eyes:     General: No scleral icterus.    Extraocular Movements: Extraocular movements intact.     Conjunctiva/sclera: Conjunctivae normal.     Pupils: Pupils are equal, round, and reactive to light.  Cardiovascular:     Rate and Rhythm: Normal rate and regular rhythm.     Pulses: Normal pulses.     Heart sounds: Normal heart sounds. No murmur heard.    No friction rub. No gallop.  Pulmonary:     Effort: Pulmonary effort is normal.     Breath sounds: Normal breath sounds. No wheezing, rhonchi or rales.  Abdominal:     General: Abdomen is flat. Bowel sounds are normal. There is no distension.     Palpations: Abdomen is soft.     Tenderness: There is no abdominal tenderness.  Musculoskeletal:        General: No swelling. Normal range of motion.     Cervical back: Normal range of motion.     Right lower leg: No edema.     Left lower leg: No edema.  Lymphadenopathy:     Cervical: No cervical adenopathy.  Skin:    General: Skin is warm and dry.     Capillary Refill: Capillary refill takes less than 2 seconds.     Coloration: Skin is not jaundiced.  Neurological:     General: No focal deficit present.     Mental Status: She is alert and oriented to person, place, and time.  Psychiatric:        Mood and Affect: Mood normal.        Behavior: Behavior normal.   Last CBC Lab Results  Component Value Date   WBC 12.0 (H) 06/14/2022   HGB 13.2 06/14/2022   HCT 40.1 06/14/2022   MCV 88 06/14/2022   MCH 28.9 06/14/2022   RDW 13.1 06/14/2022   PLT 355 06/14/2022   Last metabolic panel Lab Results  Component Value Date   GLUCOSE 177 (H) 06/14/2022   NA 138 06/14/2022   K 4.5 06/14/2022   CL 102 06/14/2022   CO2 21 06/14/2022   BUN 17 06/14/2022   CREATININE 0.61 06/14/2022   EGFR 109 06/14/2022   CALCIUM 9.7 06/14/2022   PHOS 4.1 04/10/2015   PROT 6.6 11/11/2021   ALBUMIN 4.3 11/11/2021   LABGLOB 2.3 11/11/2021    AGRATIO 1.9 11/11/2021   BILITOT 0.5 11/11/2021   ALKPHOS 95 11/11/2021   AST 19 11/11/2021   ALT 36 (H) 11/11/2021   ANIONGAP 7 10/21/2020   Last lipids Lab Results  Component Value Date   CHOL 118 11/11/2021   HDL 28 (L) 11/11/2021   LDLCALC 67 11/11/2021   TRIG 128 11/11/2021   CHOLHDL 4.2 11/11/2021   Last hemoglobin A1c Lab Results  Component Value Date   HGBA1C 8.3 (H) 06/14/2022   Last thyroid functions Lab Results  Component Value Date   TSH 1.720 11/11/2021   Last vitamin D Lab Results  Component Value Date   VD25OH 30.2 06/14/2022   Last vitamin B12 and Folate Lab Results  Component Value Date   VITAMINB12 778 05/19/2020     Assessment & Plan:   Problem List Items Addressed This Visit       Type 2 diabetes mellitus with obesity    Returning to care today for DM follow-up.  A1c was 8.3 in February.  She is currently prescribed metformin XR 1000 mg twice daily.  Glipizide was previously discontinued and Ozempic was started.  She reports today she was unable to fill the prescription for Ozempic because her insurance did not cover this medication.  She remains interested in starting a weekly injectable medication for diabetes.  She has continued to take glipizide because she was not able to start Ozempic. -Start Mounjaro 2.5 mg weekly.  She was instructed to discontinue glipizide after she starts taking Mounjaro. -Continue metformin XR 1000 mg twice daily -Follow-up in 1 month for reassessment and repeat A1c      Return in about 1 month (around 09/11/2022) for DM.   [name], MD

## 2022-08-15 ENCOUNTER — Other Ambulatory Visit: Payer: Self-pay

## 2022-08-15 DIAGNOSIS — Z96659 Presence of unspecified artificial knee joint: Secondary | ICD-10-CM

## 2022-08-15 DIAGNOSIS — Z96652 Presence of left artificial knee joint: Secondary | ICD-10-CM

## 2022-08-15 DIAGNOSIS — G8929 Other chronic pain: Secondary | ICD-10-CM

## 2022-08-15 MED ORDER — OXYCODONE HCL 5 MG PO TABS: 5.0000 mg | ORAL_TABLET | ORAL | 0 refills | Status: DC | PRN

## 2022-08-16 ENCOUNTER — Encounter: Payer: Self-pay | Admitting: Orthopedic Surgery

## 2022-08-17 ENCOUNTER — Other Ambulatory Visit: Payer: Self-pay | Admitting: Orthopedic Surgery

## 2022-08-17 DIAGNOSIS — Z96652 Presence of left artificial knee joint: Secondary | ICD-10-CM

## 2022-08-17 MED ORDER — PROMETHAZINE HCL 12.5 MG PO TABS: ORAL_TABLET | ORAL | 0 refills | Status: DC

## 2022-08-17 NOTE — Assessment & Plan Note (Signed)
Returning to care today for DM follow-up.  A1c was 8.3 in February.  She is currently prescribed metformin XR 1000 mg twice daily.  Glipizide was previously discontinued and Ozempic was started.  She reports today she was unable to fill the prescription for Ozempic because her insurance did not cover this medication.  She remains interested in starting a weekly injectable medication for diabetes.  She has continued to take glipizide because she was not able to start Ozempic. -Start Mounjaro 2.5 mg weekly.  She was instructed to discontinue glipizide after she starts taking Mounjaro. -Continue metformin XR 1000 mg twice daily -Follow-up in 1 month for reassessment and repeat A1c

## 2022-09-01 ENCOUNTER — Ambulatory Visit: Payer: 59 | Admitting: Orthopedic Surgery

## 2022-09-05 ENCOUNTER — Encounter: Payer: Self-pay | Admitting: Orthopedic Surgery

## 2022-09-05 DIAGNOSIS — Z96659 Presence of unspecified artificial knee joint: Secondary | ICD-10-CM

## 2022-09-05 DIAGNOSIS — Z96652 Presence of left artificial knee joint: Secondary | ICD-10-CM

## 2022-09-05 DIAGNOSIS — G8929 Other chronic pain: Secondary | ICD-10-CM

## 2022-09-05 MED ORDER — OXYCODONE HCL 5 MG PO TABS: 5.0000 mg | ORAL_TABLET | ORAL | 0 refills | Status: DC | PRN

## 2022-09-06 ENCOUNTER — Encounter: Payer: Self-pay | Admitting: Orthopedic Surgery

## 2022-09-07 ENCOUNTER — Encounter: Payer: Self-pay | Admitting: Orthopedic Surgery

## 2022-09-08 ENCOUNTER — Other Ambulatory Visit: Payer: Self-pay

## 2022-09-08 DIAGNOSIS — Z1231 Encounter for screening mammogram for malignant neoplasm of breast: Secondary | ICD-10-CM

## 2022-09-09 ENCOUNTER — Ambulatory Visit: Payer: 59 | Admitting: Orthopedic Surgery

## 2022-09-13 ENCOUNTER — Ambulatory Visit: Payer: 59 | Admitting: Internal Medicine

## 2022-09-23 ENCOUNTER — Encounter: Payer: Self-pay | Admitting: Internal Medicine

## 2022-09-23 ENCOUNTER — Ambulatory Visit: Payer: 59 | Admitting: Orthopedic Surgery

## 2022-09-23 ENCOUNTER — Encounter: Payer: Self-pay | Admitting: Orthopedic Surgery

## 2022-09-23 ENCOUNTER — Ambulatory Visit (HOSPITAL_COMMUNITY)
Admission: RE | Admit: 2022-09-23 | Discharge: 2022-09-23 | Disposition: A | Discharge status: Home / Self Care | Payer: 59 | Source: Ambulatory Visit | Attending: Internal Medicine | Admitting: Internal Medicine

## 2022-09-23 ENCOUNTER — Ambulatory Visit: Payer: 59 | Admitting: Internal Medicine

## 2022-09-23 VITALS — BP 115/75 | HR 79 | Ht 65.0 in | Wt 205.0 lb

## 2022-09-23 VITALS — BP 141/73 | HR 78 | Ht 65.0 in | Wt 205.0 lb

## 2022-09-23 DIAGNOSIS — Z124 Encounter for screening for malignant neoplasm of cervix: Secondary | ICD-10-CM

## 2022-09-23 DIAGNOSIS — E782 Mixed hyperlipidemia: Secondary | ICD-10-CM | POA: Diagnosis not present

## 2022-09-23 DIAGNOSIS — E669 Obesity, unspecified: Secondary | ICD-10-CM

## 2022-09-23 DIAGNOSIS — Z800 Family history of malignant neoplasm of digestive organs: Secondary | ICD-10-CM | POA: Diagnosis not present

## 2022-09-23 DIAGNOSIS — Z1159 Encounter for screening for other viral diseases: Secondary | ICD-10-CM

## 2022-09-23 DIAGNOSIS — Z1231 Encounter for screening mammogram for malignant neoplasm of breast: Secondary | ICD-10-CM | POA: Insufficient documentation

## 2022-09-23 DIAGNOSIS — E1169 Type 2 diabetes mellitus with other specified complication: Secondary | ICD-10-CM | POA: Diagnosis not present

## 2022-09-23 DIAGNOSIS — Z96652 Presence of left artificial knee joint: Secondary | ICD-10-CM

## 2022-09-23 DIAGNOSIS — E1165 Type 2 diabetes mellitus with hyperglycemia: Secondary | ICD-10-CM | POA: Diagnosis not present

## 2022-09-23 DIAGNOSIS — Z720 Tobacco use: Secondary | ICD-10-CM

## 2022-09-23 DIAGNOSIS — Z122 Encounter for screening for malignant neoplasm of respiratory organs: Secondary | ICD-10-CM

## 2022-09-23 MED ORDER — OZEMPIC (0.25 OR 0.5 MG/DOSE) 2 MG/3ML ~~LOC~~ SOPN
0.2500 mg | PEN_INJECTOR | SUBCUTANEOUS | 0 refills | Status: DC
Start: 2022-09-23 — End: 2022-11-18

## 2022-09-23 NOTE — Assessment & Plan Note (Signed)
Last colonoscopy completed in 2015.  States that she is due for repeat screening colonoscopy.  She plans to contact her gastroenterologist to schedule follow-up.

## 2022-09-23 NOTE — Patient Instructions (Signed)
It was a pleasure to see you today.  Thank you for giving [place] the opportunity to be involved in your care.  Below is a brief recap of your visit and next steps.  We will plan to see you again in 1 month.  Summary No medication changes today Lung cancer screening referral placed Repeat labs Follow up in 1 month

## 2022-09-23 NOTE — Progress Notes (Signed)
Established Patient Office Visit  Subjective   Patient ID: [name], female    DOB: 09/24/1970  Age: 52 y.o. MRN: [id number]  Chief Complaint  Patient presents with   Diabetes    Follow up   [name] returns to care today for diabetes follow-up.  She was last evaluated by me on 4/19.  Majority 1 5 mg weekly was prescribed at that time and she reported she has not been able to fill Ozempic due to a lack of insurance coverage.  Glipizide was also discontinued.  1 month follow-up was arranged for reassessment and repeat A1c.  She was evaluated earlier today by orthopedic surgery.  There have otherwise been no acute interval events.  [name] reports feeling well today.  She is asymptomatic and has no additional concerns to discuss.  Past Medical History:  Diagnosis Date   Arthritis    Carpal tunnel syndrome, bilateral    Coronary artery disease    Diabetes mellitus without complication (HCC)    no meds; diet controlled   Diverticulitis    Hyperlipidemia    Phreesia 08/10/2020   Hypertension    Hypertriglyceridemia    Myocardial infarction (HCC)    Phreesia 08/10/2020   PONV (postoperative nausea and vomiting)    Sciatica    Past Surgical History:  Procedure Laterality Date   ablasion of uterus     CARDIAC CATHETERIZATION     CARPAL TUNNEL RELEASE  11/10/2010   Procedure: CARPAL TUNNEL RELEASE;  Surgeon: Fuller Canada, MD;  Location: [facility];  Service: Orthopedics;  Laterality: Right;   CHOLECYSTECTOMY     [facility]   COLON RESECTION N/A 04/08/2015   Procedure: LAPAROSCOPIC HAND ASSISTED PARTIAL COLECTOMY  CONVERTED TO OPEN AT 7829;  Surgeon: Franky Macho, MD;  Location: [facility];  Service: General;  Laterality: N/A;   COLONOSCOPY N/A 03/28/2014   Procedure: COLONOSCOPY;  Surgeon: Malissa Hippo, MD;  [facility];  Service: Endoscopy;  Laterality: N/A;  730   CORONARY STENT INTERVENTION  04/07/2020   CORONARY STENT INTERVENTION N/A 04/07/2020   Procedure:  CORONARY STENT INTERVENTION;  Surgeon: Corky Crafts, MD;  Location: [facility];  Service: Cardiovascular;  Laterality: N/A;   JOINT REPLACEMENT N/A    Phreesia 08/10/2020   KNEE ARTHROSCOPY WITH DRILLING/MICROFRACTURE Left 10/20/2016   Procedure: LEFT KNEE ARTHROSCOPY WITH MICROFRACTURE;  Surgeon: Vickki Hearing, MD;  Location: [facility];  Service: Orthopedics;  Laterality: Left;   KNEE ARTHROSCOPY WITH DRILLING/MICROFRACTURE Left 04/27/2017   Procedure: KNEE ARTHROSCOPY WITH DRILLING/MICROFRACTURE;  Surgeon: Vickki Hearing, MD;  Location: [facility];  Service: Orthopedics;  Laterality: Left;   KNEE ARTHROSCOPY WITH OSTEOCHONDRAL DEFECT REPAIR Left 04/27/2017   Procedure: KNEE ARTHROSCOPY WITH OSTEOCHONDRAL DEFECT REPAIR Autograft;  Surgeon: Vickki Hearing, MD;  Location: [facility];  Service: Orthopedics;  Laterality: Left;   KNEE SURGERY     LEFT HEART CATH AND CORONARY ANGIOGRAPHY N/A 04/07/2020   Procedure: LEFT HEART CATH AND CORONARY ANGIOGRAPHY;  Surgeon: Corky Crafts, MD;  Location: [facility] INVASIVE CV LAB;  Service: Cardiovascular;  Laterality: N/A;   PARTIAL COLECTOMY N/A 04/08/2015   Procedure: PARTIAL COLECTOMY;  Surgeon: Franky Macho, MD;  Location: [facility];  Service: General;  Laterality: N/A;   right knee arthroscopy     SMALL INTESTINE SURGERY N/A    Phreesia 08/10/2020   TOTAL KNEE ARTHROPLASTY Left 10/17/2017   Procedure: TOTAL KNEE ARTHROPLASTY;  Surgeon: Vickki Hearing, MD;  Location: [facility];  Service: Orthopedics;  Laterality: Left;  DePuy    TOTAL KNEE REVISION Left 10/20/2020   Procedure: REVISION TIBIAL COMPONENT LEFT TOTAL KNEE;  Surgeon: Vickki Hearing, MD;  Location: [facility];  Service: Orthopedics;  Laterality: Left;   TUBAL LIGATION     Social History   Tobacco Use   Smoking status: Every Day    Packs/day: 1.00    Years: 26.00    Additional pack years: 0.00    Total pack years: 26.00    Types: Cigarettes    Last attempt to  quit: 04/05/2020    Years since quitting: 2.4   Smokeless tobacco: Never  Vaping Use   Vaping Use: Never used  Substance Use Topics   Alcohol use: No    Alcohol/week: 0.0 standard drinks of alcohol   Drug use: No   Family History  Problem Relation Age of Onset   Diabetes type I Mother    Hyperlipidemia Mother    Diabetes type II Father    Hypertension Father    Hyperlipidemia Father    Diabetes type II Brother    Diabetes type II Brother    Cancer Other    Diabetes Other    Arthritis Other    Asthma Other    Anesthesia problems Neg Hx    Allergies  Allergen Reactions   Other    Flagyl [Metronidazole] Nausea Only   Hydrocodone Nausea And Vomiting and Other (See Comments)    Can take need to take with medication for nausea and vomitting   Review of Systems  Constitutional:  Negative for chills and fever.  HENT:  Negative for sore throat.   Respiratory:  Negative for cough and shortness of breath.   Cardiovascular:  Negative for chest pain, palpitations and leg swelling.  Gastrointestinal:  Negative for abdominal pain, blood in stool, constipation, diarrhea, nausea and vomiting.  Genitourinary:  Negative for dysuria and hematuria.  Musculoskeletal:  Negative for myalgias.  Skin:  Negative for itching and rash.  Neurological:  Negative for dizziness and headaches.  Psychiatric/Behavioral:  Negative for depression and suicidal ideas.      Objective:     BP 115/75   Pulse 79   Ht 5\' 5"  (1.651 m)   Wt 205 lb (93 kg)   SpO2 97%   BMI 34.11 kg/m  BP Readings from Last 3 Encounters:  09/23/22 115/75  09/23/22 (!) 141/73  08/12/22 133/69   Physical Exam Vitals reviewed.  Constitutional:      General: She is not in acute distress.    Appearance: Normal appearance. She is obese. She is not toxic-appearing.  HENT:     Head: Normocephalic and atraumatic.     Right Ear: External ear normal.     Left Ear: External ear normal.     Nose: Nose normal. No congestion or  rhinorrhea.     Mouth/Throat:     Mouth: Mucous membranes are moist.     Pharynx: Oropharynx is clear. No oropharyngeal exudate or posterior oropharyngeal erythema.  Eyes:     General: No scleral icterus.    Extraocular Movements: Extraocular movements intact.     Conjunctiva/sclera: Conjunctivae normal.     Pupils: Pupils are equal, round, and reactive to light.  Cardiovascular:     Rate and Rhythm: Normal rate and regular rhythm.     Pulses: Normal pulses.     Heart sounds: Normal heart sounds. No murmur heard.    No friction rub. No gallop.  Pulmonary:  Effort: Pulmonary effort is normal.     Breath sounds: Normal breath sounds. No wheezing, rhonchi or rales.  Abdominal:     General: Abdomen is flat. Bowel sounds are normal. There is no distension.     Palpations: Abdomen is soft.     Tenderness: There is no abdominal tenderness.  Musculoskeletal:        General: No swelling. Normal range of motion.     Cervical back: Normal range of motion.     Right lower leg: No edema.     Left lower leg: No edema.  Lymphadenopathy:     Cervical: No cervical adenopathy.  Skin:    General: Skin is warm and dry.     Capillary Refill: Capillary refill takes less than 2 seconds.     Coloration: Skin is not jaundiced.  Neurological:     General: No focal deficit present.     Mental Status: She is alert and oriented to person, place, and time.  Psychiatric:        Mood and Affect: Mood normal.        Behavior: Behavior normal.   Last CBC Lab Results  Component Value Date   WBC 12.0 (H) 06/14/2022   HGB 13.2 06/14/2022   HCT 40.1 06/14/2022   MCV 88 06/14/2022   MCH 28.9 06/14/2022   RDW 13.1 06/14/2022   PLT 355 06/14/2022   Last metabolic panel Lab Results  Component Value Date   GLUCOSE 177 (H) 06/14/2022   NA 138 06/14/2022   K 4.5 06/14/2022   CL 102 06/14/2022   CO2 21 06/14/2022   BUN 17 06/14/2022   CREATININE 0.61 06/14/2022   EGFR 109 06/14/2022   CALCIUM 9.7  06/14/2022   PHOS 4.1 04/10/2015   PROT 6.6 11/11/2021   ALBUMIN 4.3 11/11/2021   LABGLOB 2.3 11/11/2021   AGRATIO 1.9 11/11/2021   BILITOT 0.5 11/11/2021   ALKPHOS 95 11/11/2021   AST 19 11/11/2021   ALT 36 (H) 11/11/2021   ANIONGAP 7 10/21/2020   Last lipids Lab Results  Component Value Date   CHOL 118 11/11/2021   HDL 28 (L) 11/11/2021   LDLCALC 67 11/11/2021   TRIG 128 11/11/2021   CHOLHDL 4.2 11/11/2021   Last hemoglobin A1c Lab Results  Component Value Date   HGBA1C 8.3 (H) 06/14/2022   Last thyroid functions Lab Results  Component Value Date   TSH 1.720 11/11/2021   Last vitamin D Lab Results  Component Value Date   VD25OH 30.2 06/14/2022   Last vitamin B12 and Folate Lab Results  Component Value Date   VITAMINB12 778 05/19/2020     Assessment & Plan:   Problem List Items Addressed This Visit       Type 2 diabetes mellitus with obesity (HCC)    Presenting presenting today for diabetes follow-up.  A1c 8.3 in February.  She is currently prescribed Mounjaro 2.5 mg weekly and metformin XR 1000 mg twice daily.  She has not been able to start [facility] due to cost, but remains on metformin. -Repeat A1c ordered today -Will try Ozempic again as she may qualify for the patient assistance program.  New Rx sent today for 0.25 mg weekly      Family hx of colon cancer requiring screening colonoscopy    Last colonoscopy completed in 2015.  States that she is due for repeat screening colonoscopy.  She plans to contact her gastroenterologist to schedule follow-up.      HLD (hyperlipidemia)  She is currently prescribed atorvastatin 40 mg daily.  Lipid panel last updated in July 2023. -Repeat lipid panel ordered today      Cervical cancer screening    Due for Pap smear.  Last completed more than 20 years ago. -She will schedule Pap smear for 1 month from now.  Female provider preferred.      Current tobacco use    Currently smokes 1 pack/day of cigarettes and  has been smoking for 35 years.  She is precontemplative with regards to cessation, but is agreeable to a referral for lung cancer screening. -The patient was counseled on the dangers of tobacco use, and was advised to quit and reluctant to quit.  Reviewed strategies to maximize success, including removing cigarettes and smoking materials from environment, stress management, substitution of other forms of reinforcement, support of family/friends, and written materials.  -Lung cancer screening program referral placed today      Return in about 4 weeks (around 10/21/2022).   [name], MD

## 2022-09-23 NOTE — Assessment & Plan Note (Addendum)
Currently smokes 1 pack/day of cigarettes and has been smoking for 35 years.  She is precontemplative with regards to cessation, but is agreeable to a referral for lung cancer screening. -The patient was counseled on the dangers of tobacco use, and was advised to quit and reluctant to quit.  Reviewed strategies to maximize success, including removing cigarettes and smoking materials from environment, stress management, substitution of other forms of reinforcement, support of family/friends, and written materials.  -Lung cancer screening program referral placed today

## 2022-09-23 NOTE — Progress Notes (Signed)
Chief Complaint  Patient presents with   Knee Pain    L/ have good days and bad days.   Oxycodone 5 mg 120 tablets on 09/08/2022 should last 20 to 30 days  Side effects mild nausea but this is handled by  Pain level 10 out of 10 on a bad day especially if she is work to 12-hour shifts in a row  2 out of 10 on days when she is not working  The 5 mg tablets 1-2 at a time worked better than the 10 mg tablet  Urine drug screen ordered  Follow-up 3 months for chronic pain management

## 2022-09-23 NOTE — Assessment & Plan Note (Signed)
She is currently prescribed atorvastatin 40 mg daily.  Lipid panel last updated in July 2023. -Repeat lipid panel ordered today

## 2022-09-23 NOTE — Assessment & Plan Note (Signed)
Presenting presenting today for diabetes follow-up.  A1c 8.3 in February.  She is currently prescribed Mounjaro 2.5 mg weekly and metformin XR 1000 mg twice daily.  She has not been able to start [facility] due to cost, but remains on metformin. -Repeat A1c ordered today -Will try Ozempic again as she may qualify for the patient assistance program.  New Rx sent today for 0.25 mg weekly

## 2022-09-23 NOTE — Assessment & Plan Note (Signed)
Due for Pap smear.  Last completed more than 20 years ago. -She will schedule Pap smear for 1 month from now.  Female provider preferred.

## 2022-09-24 LAB — LIPID PANEL
Chol/HDL Ratio: 3.1 ratio (ref 0.0–4.4)
Cholesterol, Total: 71 mg/dL — ABNORMAL LOW (ref 100–199)
HDL: 23 mg/dL — ABNORMAL LOW (ref >39)
LDL Chol Calc (NIH): 25 mg/dL (ref 0–99)
Triglycerides: 126 mg/dL (ref 0–149)
VLDL Cholesterol Cal: 23 mg/dL (ref 5–40)

## 2022-09-24 LAB — HEMOGLOBIN A1C
Est. average glucose Bld gHb Est-mCnc: 163 mg/dL
Hgb A1c MFr Bld: 7.3 % — ABNORMAL HIGH (ref 4.8–5.6)

## 2022-09-24 LAB — HCV AB W REFLEX TO QUANT PCR: HCV Ab: NONREACTIVE

## 2022-09-24 LAB — HCV INTERPRETATION

## 2022-09-27 DIAGNOSIS — Z833 Family history of diabetes mellitus: Secondary | ICD-10-CM | POA: Diagnosis not present

## 2022-09-27 DIAGNOSIS — E119 Type 2 diabetes mellitus without complications: Secondary | ICD-10-CM | POA: Diagnosis not present

## 2022-09-27 DIAGNOSIS — Z811 Family history of alcohol abuse and dependence: Secondary | ICD-10-CM | POA: Diagnosis not present

## 2022-09-27 DIAGNOSIS — R32 Unspecified urinary incontinence: Secondary | ICD-10-CM | POA: Diagnosis not present

## 2022-09-27 DIAGNOSIS — I10 Essential (primary) hypertension: Secondary | ICD-10-CM | POA: Diagnosis not present

## 2022-09-27 DIAGNOSIS — E669 Obesity, unspecified: Secondary | ICD-10-CM | POA: Diagnosis not present

## 2022-09-27 DIAGNOSIS — Z008 Encounter for other general examination: Secondary | ICD-10-CM | POA: Diagnosis not present

## 2022-09-27 DIAGNOSIS — Z7984 Long term (current) use of oral hypoglycemic drugs: Secondary | ICD-10-CM | POA: Diagnosis not present

## 2022-09-27 DIAGNOSIS — Z803 Family history of malignant neoplasm of breast: Secondary | ICD-10-CM | POA: Diagnosis not present

## 2022-09-27 DIAGNOSIS — G8929 Other chronic pain: Secondary | ICD-10-CM | POA: Diagnosis not present

## 2022-09-27 DIAGNOSIS — E785 Hyperlipidemia, unspecified: Secondary | ICD-10-CM | POA: Diagnosis not present

## 2022-09-27 DIAGNOSIS — I2510 Atherosclerotic heart disease of native coronary artery without angina pectoris: Secondary | ICD-10-CM | POA: Diagnosis not present

## 2022-09-27 DIAGNOSIS — Z8249 Family history of ischemic heart disease and other diseases of the circulatory system: Secondary | ICD-10-CM | POA: Diagnosis not present

## 2022-09-29 ENCOUNTER — Encounter: Payer: Self-pay | Admitting: Orthopedic Surgery

## 2022-09-29 DIAGNOSIS — G8929 Other chronic pain: Secondary | ICD-10-CM

## 2022-09-29 DIAGNOSIS — Z96652 Presence of left artificial knee joint: Secondary | ICD-10-CM

## 2022-09-29 DIAGNOSIS — T84038D Mechanical loosening of other internal prosthetic joint, subsequent encounter: Secondary | ICD-10-CM

## 2022-09-30 ENCOUNTER — Telehealth: Payer: Self-pay | Admitting: Internal Medicine

## 2022-09-30 NOTE — Telephone Encounter (Signed)
Pt assistance forms faxed   Pt aware forms ready for pick up

## 2022-10-03 ENCOUNTER — Encounter: Payer: Self-pay | Admitting: Orthopedic Surgery

## 2022-10-03 MED ORDER — OXYCODONE HCL 5 MG PO TABS
5.0000 mg | ORAL_TABLET | ORAL | 0 refills | Status: DC | PRN
Start: 2022-10-03 — End: 2022-10-24

## 2022-10-17 ENCOUNTER — Ambulatory Visit: Payer: 59 | Admitting: Nurse Practitioner

## 2022-10-19 ENCOUNTER — Encounter: Payer: Self-pay | Admitting: Orthopedic Surgery

## 2022-10-21 ENCOUNTER — Ambulatory Visit: Payer: 59 | Admitting: Internal Medicine

## 2022-10-23 ENCOUNTER — Encounter: Payer: Self-pay | Admitting: Orthopedic Surgery

## 2022-10-24 ENCOUNTER — Other Ambulatory Visit: Payer: Self-pay

## 2022-10-24 DIAGNOSIS — Z96652 Presence of left artificial knee joint: Secondary | ICD-10-CM

## 2022-10-24 DIAGNOSIS — G8929 Other chronic pain: Secondary | ICD-10-CM

## 2022-10-24 DIAGNOSIS — Z96659 Presence of unspecified artificial knee joint: Secondary | ICD-10-CM

## 2022-10-24 MED ORDER — OXYCODONE HCL 5 MG PO TABS
5.0000 mg | ORAL_TABLET | ORAL | 0 refills | Status: DC | PRN
Start: 2022-10-24 — End: 2022-11-15

## 2022-11-07 ENCOUNTER — Encounter: Payer: Self-pay | Admitting: Internal Medicine

## 2022-11-08 ENCOUNTER — Other Ambulatory Visit: Payer: Self-pay

## 2022-11-08 DIAGNOSIS — E1165 Type 2 diabetes mellitus with hyperglycemia: Secondary | ICD-10-CM

## 2022-11-08 MED ORDER — METFORMIN HCL ER 500 MG PO TB24
1000.0000 mg | ORAL_TABLET | Freq: Two times a day (BID) | ORAL | 3 refills | Status: DC
Start: 2022-11-08 — End: 2023-12-26

## 2022-11-14 ENCOUNTER — Ambulatory Visit: Payer: 59 | Admitting: Nurse Practitioner

## 2022-11-15 ENCOUNTER — Encounter: Payer: Self-pay | Admitting: Orthopedic Surgery

## 2022-11-15 DIAGNOSIS — G8929 Other chronic pain: Secondary | ICD-10-CM

## 2022-11-15 DIAGNOSIS — Z96659 Presence of unspecified artificial knee joint: Secondary | ICD-10-CM

## 2022-11-15 DIAGNOSIS — Z96652 Presence of left artificial knee joint: Secondary | ICD-10-CM

## 2022-11-15 MED ORDER — OXYCODONE HCL 5 MG PO TABS
5.0000 mg | ORAL_TABLET | ORAL | 0 refills | Status: DC | PRN
Start: 2022-11-15 — End: 2022-12-15

## 2022-11-15 MED ORDER — PROMETHAZINE HCL 12.5 MG PO TABS: ORAL_TABLET | ORAL | 0 refills | Status: DC

## 2022-11-18 ENCOUNTER — Encounter: Payer: Self-pay | Admitting: Internal Medicine

## 2022-11-18 ENCOUNTER — Ambulatory Visit: Payer: 59 | Admitting: Internal Medicine

## 2022-11-18 VITALS — BP 129/78 | HR 88 | Ht 65.0 in | Wt 193.4 lb

## 2022-11-18 DIAGNOSIS — E1169 Type 2 diabetes mellitus with other specified complication: Secondary | ICD-10-CM

## 2022-11-18 DIAGNOSIS — E669 Obesity, unspecified: Secondary | ICD-10-CM

## 2022-11-18 NOTE — Assessment & Plan Note (Signed)
Presenting today for diabetes follow-up.  A1c improved to 7.3 in May from 8.3 previously.  She is currently prescribed metformin XR 1000 mg twice daily.  We have previously attempted to start Ozempic and Mounjaro, but neither is covered by her insurance.  She remains focused on lifestyle modifications aimed at improving her blood sugar and losing weight.  She has lost 12 pounds since her last appointment. -[name] was congratulated on her progress.  For now, she will continue to focus on lifestyle modifications aimed at weight loss and improving her blood sugar.  We will continue metformin XR 1000 mg twice daily.  Repeat A1c at follow-up in 3 months.

## 2022-11-18 NOTE — Progress Notes (Signed)
Established Patient Office Visit  Subjective   Patient ID: [name], female    DOB: 1970-06-25  Age: 52 y.o. MRN: [id number]  Chief Complaint  Patient presents with   Diabetes    Follow up   [name] returns to care today for follow up of diabetes mellitus.  She was last evaluated by me on 5/31 at which time she reported that she had not been able to start [facility] due to cost.  Ozempic was again prescribed.  She was also referred for lung cancer screening.  There have been no acute interval events.  [name] reports feeling well today.  She reports that Ozempic coverage was again denied by her insurance.  She is focused on lifestyle modifications aimed at improving her A1c.  Of note, she has lost 12 pounds since her last appointment.  She does not have additional concerns to discuss today.  Past Medical History:  Diagnosis Date   Arthritis    Carpal tunnel syndrome, bilateral    Coronary artery disease    Diabetes mellitus without complication (HCC)    no meds; diet controlled   Diverticulitis    Hyperlipidemia    Phreesia 08/10/2020   Hypertension    Hypertriglyceridemia    Myocardial infarction (HCC)    Phreesia 08/10/2020   PONV (postoperative nausea and vomiting)    Sciatica    Past Surgical History:  Procedure Laterality Date   ablasion of uterus     CARDIAC CATHETERIZATION     CARPAL TUNNEL RELEASE  11/10/2010   Procedure: CARPAL TUNNEL RELEASE;  Surgeon: Fuller Canada, MD;  Location: [facility];  Service: Orthopedics;  Laterality: Right;   CHOLECYSTECTOMY     APH   COLON RESECTION N/A 04/08/2015   Procedure: LAPAROSCOPIC HAND ASSISTED PARTIAL COLECTOMY  CONVERTED TO OPEN AT 8413;  Surgeon: Franky Macho, MD;  Location: [facility];  Service: General;  Laterality: N/A;   COLONOSCOPY N/A 03/28/2014   Procedure: COLONOSCOPY;  Surgeon: Malissa Hippo, MD;  Location: [facility];  Service: Endoscopy;  Laterality: N/A;  730   CORONARY STENT INTERVENTION  04/07/2020    CORONARY STENT INTERVENTION N/A 04/07/2020   Procedure: CORONARY STENT INTERVENTION;  Surgeon: Corky Crafts, MD;  Location: [facility];  Service: Cardiovascular;  Laterality: N/A;   JOINT REPLACEMENT N/A    Phreesia 08/10/2020   KNEE ARTHROSCOPY WITH DRILLING/MICROFRACTURE Left 10/20/2016   Procedure: LEFT KNEE ARTHROSCOPY WITH MICROFRACTURE;  Surgeon: Vickki Hearing, MD;  Location: [facility];  Service: Orthopedics;  Laterality: Left;   KNEE ARTHROSCOPY WITH DRILLING/MICROFRACTURE Left 04/27/2017   Procedure: KNEE ARTHROSCOPY WITH DRILLING/MICROFRACTURE;  Surgeon: Vickki Hearing, MD;  Location: [facility];  Service: Orthopedics;  Laterality: Left;   KNEE ARTHROSCOPY WITH OSTEOCHONDRAL DEFECT REPAIR Left 04/27/2017   Procedure: KNEE ARTHROSCOPY WITH OSTEOCHONDRAL DEFECT REPAIR Autograft;  Surgeon: Vickki Hearing, MD;  Location: [facility];  Service: Orthopedics;  Laterality: Left;   KNEE SURGERY     LEFT HEART CATH AND CORONARY ANGIOGRAPHY N/A 04/07/2020   Procedure: LEFT HEART CATH AND CORONARY ANGIOGRAPHY;  Surgeon: Corky Crafts, MD;  Location: [facility]VAS[facility] CV LAB;  Service: Cardiovascular;  Laterality: N/A;   PARTIAL COLECTOMY N/A 04/08/2015   Procedure: PARTIAL COLECTOMY;  Surgeon: Franky Macho, MD;  Location: [facility];  Service: General;  Laterality: N/A;   right knee arthroscopy     SMALL INTESTINE SURGERY N/A    Phreesia 08/10/2020   TOTAL KNEE ARTHROPLASTY Left 10/17/2017  Procedure: TOTAL KNEE ARTHROPLASTY;  Surgeon: Vickki Hearing, MD;  Location: [facility];  Service: Orthopedics;  Laterality: Left;  DePuy    TOTAL KNEE REVISION Left 10/20/2020   Procedure: REVISION TIBIAL COMPONENT LEFT TOTAL KNEE;  Surgeon: Vickki Hearing, MD;  Location: [facility];  Service: Orthopedics;  Laterality: Left;   TUBAL LIGATION     Social History   Tobacco Use   Smoking status: Every Day    Current packs/day: 0.00    Average packs/day: 1 pack/day for 26.0 years (26.0  ttl pk-yrs)    Types: Cigarettes    Start date: 04/05/1994    Last attempt to quit: 04/05/2020    Years since quitting: 2.6   Smokeless tobacco: Never  Vaping Use   Vaping status: Never Used  Substance Use Topics   Alcohol use: No    Alcohol/week: 0.0 standard drinks of alcohol   Drug use: No   Family History  Problem Relation Age of Onset   Diabetes type I Mother    Hyperlipidemia Mother    Diabetes type II Father    Hypertension Father    Hyperlipidemia Father    Diabetes type II Brother    Diabetes type II Brother    Cancer Other    Diabetes Other    Arthritis Other    Asthma Other    Anesthesia problems Neg Hx    Allergies  Allergen Reactions   Other    Flagyl [Metronidazole] Nausea Only   Hydrocodone Nausea And Vomiting and Other (See Comments)    Can take need to take with medication for nausea and vomitting   Review of Systems  Constitutional:  Negative for chills and fever.  HENT:  Negative for sore throat.   Respiratory:  Negative for cough and shortness of breath.   Cardiovascular:  Negative for chest pain, palpitations and leg swelling.  Gastrointestinal:  Negative for abdominal pain, blood in stool, constipation, diarrhea, nausea and vomiting.  Genitourinary:  Negative for dysuria and hematuria.  Musculoskeletal:  Negative for myalgias.  Skin:  Negative for itching and rash.  Neurological:  Negative for dizziness and headaches.  Psychiatric/Behavioral:  Negative for depression and suicidal ideas.      Objective:     BP 129/78   Pulse 88   Ht 5\' 5"  (1.651 m)   Wt 193 lb 6.4 oz (87.7 kg)   SpO2 98%   BMI 32.18 kg/m  BP Readings from Last 3 Encounters:  11/18/22 129/78  09/23/22 115/75  09/23/22 (!) 141/73   Physical Exam Vitals reviewed.  Constitutional:      General: She is not in acute distress.    Appearance: Normal appearance. She is obese. She is not toxic-appearing.  HENT:     Head: Normocephalic and atraumatic.     Right Ear:  External ear normal.     Left Ear: External ear normal.     Nose: Nose normal. No congestion or rhinorrhea.     Mouth/Throat:     Mouth: Mucous membranes are moist.     Pharynx: Oropharynx is clear. No oropharyngeal exudate or posterior oropharyngeal erythema.  Eyes:     General: No scleral icterus.    Extraocular Movements: Extraocular movements intact.     Conjunctiva/sclera: Conjunctivae normal.     Pupils: Pupils are equal, round, and reactive to light.  Cardiovascular:     Rate and Rhythm: Normal rate and regular rhythm.     Pulses: Normal pulses.     Heart sounds:  Normal heart sounds. No murmur heard.    No friction rub. No gallop.  Pulmonary:     Effort: Pulmonary effort is normal.     Breath sounds: Normal breath sounds. No wheezing, rhonchi or rales.  Abdominal:     General: Abdomen is flat. Bowel sounds are normal. There is no distension.     Palpations: Abdomen is soft.     Tenderness: There is no abdominal tenderness.  Musculoskeletal:        General: No swelling. Normal range of motion.     Cervical back: Normal range of motion.     Right lower leg: No edema.     Left lower leg: No edema.  Lymphadenopathy:     Cervical: No cervical adenopathy.  Skin:    General: Skin is warm and dry.     Capillary Refill: Capillary refill takes less than 2 seconds.     Coloration: Skin is not jaundiced.  Neurological:     General: No focal deficit present.     Mental Status: She is alert and oriented to person, place, and time.  Psychiatric:        Mood and Affect: Mood normal.        Behavior: Behavior normal.   Last CBC Lab Results  Component Value Date   WBC 12.0 (H) 06/14/2022   HGB 13.2 06/14/2022   HCT 40.1 06/14/2022   MCV 88 06/14/2022   MCH 28.9 06/14/2022   RDW 13.1 06/14/2022   PLT 355 06/14/2022   Last metabolic panel Lab Results  Component Value Date   GLUCOSE 177 (H) 06/14/2022   NA 138 06/14/2022   K 4.5 06/14/2022   CL 102 06/14/2022   CO2 21  06/14/2022   BUN 17 06/14/2022   CREATININE 0.61 06/14/2022   EGFR 109 06/14/2022   CALCIUM 9.7 06/14/2022   PHOS 4.1 04/10/2015   PROT 6.6 11/11/2021   ALBUMIN 4.3 11/11/2021   LABGLOB 2.3 11/11/2021   AGRATIO 1.9 11/11/2021   BILITOT 0.5 11/11/2021   ALKPHOS 95 11/11/2021   AST 19 11/11/2021   ALT 36 (H) 11/11/2021   ANIONGAP 7 10/21/2020   Last lipids Lab Results  Component Value Date   CHOL 71 (L) 09/23/2022   HDL 23 (L) 09/23/2022   LDLCALC 25 09/23/2022   TRIG 126 09/23/2022   CHOLHDL 3.1 09/23/2022   Last hemoglobin A1c Lab Results  Component Value Date   HGBA1C 7.3 (H) 09/23/2022   Last thyroid functions Lab Results  Component Value Date   TSH 1.720 11/11/2021   Last vitamin D Lab Results  Component Value Date   VD25OH 30.2 06/14/2022   Last vitamin B12 and Folate Lab Results  Component Value Date   VITAMINB12 778 05/19/2020     Assessment & Plan:   Problem List Items Addressed This Visit       Type 2 diabetes mellitus with obesity (HCC) - Primary    Presenting today for diabetes follow-up.  A1c improved to 7.3 in May from 8.3 previously.  She is currently prescribed metformin XR 1000 mg twice daily.  We have previously attempted to start Ozempic and Mounjaro, but neither is covered by her insurance.  She remains focused on lifestyle modifications aimed at improving her blood sugar and losing weight.  She has lost 12 pounds since her last appointment. -[name] was congratulated on her progress.  For now, she will continue to focus on lifestyle modifications aimed at weight loss and improving her blood  sugar.  We will continue metformin XR 1000 mg twice daily.  Repeat A1c at follow-up in 3 months.      Return in about 3 months (around 02/18/2023).   [name], MD

## 2022-11-18 NOTE — Patient Instructions (Signed)
It was a pleasure to see you today.  Thank you for giving us the opportunity to be involved in your care.  Below is a brief recap of your visit and next steps.  We will plan to see you again in 3 months.  Summary No medication changes today. We will plan for follow up in 3 months.   

## 2022-12-09 ENCOUNTER — Ambulatory Visit: Payer: 59 | Admitting: Nurse Practitioner

## 2022-12-15 ENCOUNTER — Other Ambulatory Visit: Payer: Self-pay | Admitting: Orthopedic Surgery

## 2022-12-15 ENCOUNTER — Encounter: Payer: Self-pay | Admitting: Orthopedic Surgery

## 2022-12-15 DIAGNOSIS — G8929 Other chronic pain: Secondary | ICD-10-CM

## 2022-12-15 DIAGNOSIS — Z96652 Presence of left artificial knee joint: Secondary | ICD-10-CM

## 2022-12-15 DIAGNOSIS — Z96659 Presence of unspecified artificial knee joint: Secondary | ICD-10-CM

## 2022-12-15 MED ORDER — OXYCODONE HCL 5 MG PO TABS
5.0000 mg | ORAL_TABLET | ORAL | 0 refills | Status: DC | PRN
Start: 2022-12-15 — End: 2023-01-06

## 2022-12-15 NOTE — Progress Notes (Signed)
Requested Prescriptions   Signed Prescriptions Disp Refills   oxyCODONE (OXY IR/ROXICODONE) 5 MG immediate release tablet 120 tablet 0    Sig: Take 1 tablet (5 mg total) by mouth every 4 (four) hours as needed for severe pain.

## 2022-12-16 ENCOUNTER — Other Ambulatory Visit (HOSPITAL_COMMUNITY)
Admission: RE | Admit: 2022-12-16 | Discharge: 2022-12-16 | Disposition: A | Discharge status: Home / Self Care | Payer: 59 | Source: Ambulatory Visit | Attending: Family Medicine | Admitting: Family Medicine

## 2022-12-16 ENCOUNTER — Ambulatory Visit: Payer: 59 | Admitting: Family Medicine

## 2022-12-16 ENCOUNTER — Encounter: Payer: Self-pay | Admitting: Family Medicine

## 2022-12-16 VITALS — BP 115/75 | HR 78 | Ht 65.0 in | Wt 194.0 lb

## 2022-12-16 DIAGNOSIS — Z124 Encounter for screening for malignant neoplasm of cervix: Secondary | ICD-10-CM | POA: Diagnosis not present

## 2022-12-16 DIAGNOSIS — Z01419 Encounter for gynecological examination (general) (routine) without abnormal findings: Secondary | ICD-10-CM

## 2022-12-16 NOTE — Assessment & Plan Note (Signed)
Pap smear done, Patient tolerated procedure well. Informed patient I will keep them updated on results. Right breast normal without mass, skin or nipple changes or axillary nodes, left breast normal without mass, skin or nipple changes or axillary nodes. 

## 2022-12-16 NOTE — Patient Instructions (Signed)

## 2022-12-16 NOTE — Progress Notes (Signed)
Patient Office Visit   Subjective   Patient ID: [name], female    DOB: 1970-06-01  Age: 52 y.o. MRN: [id number]  CC:  Chief Complaint  Patient presents with  . Gynecologic Exam    Yearly pap smear exam.  . Medication Refill    Pt request refill of blood sugar medication.     HPI [name] 52 year old female, presents to the clinic for cervical cancer screening. She  has a past medical history of Arthritis, Carpal tunnel syndrome, bilateral, Coronary artery disease, Diabetes mellitus without complication (HCC), Diverticulitis, Hyperlipidemia, Hypertension, Hypertriglyceridemia, Myocardial infarction (HCC), PONV (postoperative nausea and vomiting), and Sciatica.For the details of today's visit, please refer to assessment and plan.   HPI    Outpatient Encounter Medications as of 12/16/2022  Medication Sig  . atorvastatin (LIPITOR) 40 MG tablet Take 1 tablet by mouth once daily  . carvedilol (COREG) 12.5 MG tablet Take 1 tablet by mouth twice daily  . clopidogrel (PLAVIX) 75 MG tablet Take 1 tablet by mouth once daily  . glucose blood (ONETOUCH VERIO) test strip Use as instructed to monitor blood glucose once daily.  . Lancets (ONETOUCH DELICA PLUS LANCET33G) MISC by Does not apply route.  Marland Kitchen losartan (COZAAR) 50 MG tablet Take 1 tablet by mouth once daily  . metFORMIN (GLUCOPHAGE-XR) 500 MG 24 hr tablet Take 2 tablets (1,000 mg total) by mouth 2 (two) times daily with a meal.  . nitroGLYCERIN (NITROSTAT) 0.4 MG SL tablet PLACE 1 TABLET (0.4 MG TOTAL) UNDER THE TONGUE EVERY 5 (FIVE) MINUTES AS NEEDED FOR CHEST PAIN. (Patient taking differently: Place 0.4 mg under the tongue every 5 (five) minutes as needed for chest pain.)  . oxyCODONE (OXY IR/ROXICODONE) 5 MG immediate release tablet Take 1 tablet (5 mg total) by mouth every 4 (four) hours as needed for severe pain.  . promethazine (PHENERGAN) 12.5 MG tablet TAKE 1 TABLET BY MOUTH EVERY 4 HOURS AS NEEDED FOR NAUSEA FOR  VOMITING  . [DISCONTINUED] pantoprazole (PROTONIX) 40 MG tablet Take 1 tablet (40 mg total) by mouth daily. Further refills by PCP (Patient not taking: Reported on 05/18/2020)   No facility-administered encounter medications on file as of 12/16/2022.    Past Surgical History:  Procedure Laterality Date  . ablasion of uterus    . CARDIAC CATHETERIZATION    . CARPAL TUNNEL RELEASE  11/10/2010   Procedure: CARPAL TUNNEL RELEASE;  Surgeon: Fuller Canada, MD;  Location: [facility];  Service: Orthopedics;  Laterality: Right;  . CHOLECYSTECTOMY     APH  . COLON RESECTION N/A 04/08/2015   Procedure: LAPAROSCOPIC HAND ASSISTED PARTIAL COLECTOMY  CONVERTED TO OPEN AT 1027;  Surgeon: Franky Macho, MD;  Location: [facility];  Service: General;  Laterality: N/A;  . COLONOSCOPY N/A 03/28/2014   Procedure: COLONOSCOPY;  Surgeon: Malissa Hippo, MD;  [facility];  Service: Endoscopy;  Laterality: N/A;  730  . CORONARY STENT INTERVENTION  04/07/2020  . CORONARY STENT INTERVENTION N/A 04/07/2020   Procedure: CORONARY STENT INTERVENTION;  Surgeon: Corky Crafts, MD;  Location: [facility] INVASIVE CV LAB;  Service: Cardiovascular;  Laterality: N/A;  . JOINT REPLACEMENT N/A    Phreesia 08/10/2020  . KNEE ARTHROSCOPY WITH DRILLING/MICROFRACTURE Left 10/20/2016   Procedure: LEFT KNEE ARTHROSCOPY WITH MICROFRACTURE;  Surgeon: Vickki Hearing, MD;  Location: [facility];  Service: Orthopedics;  Laterality: Left;  . KNEE ARTHROSCOPY WITH DRILLING/MICROFRACTURE Left 04/27/2017   Procedure: KNEE ARTHROSCOPY WITH DRILLING/MICROFRACTURE;  Surgeon: Vickki Hearing, MD;  Location: [facility];  Service: Orthopedics;  Laterality: Left;  . KNEE ARTHROSCOPY WITH OSTEOCHONDRAL DEFECT REPAIR Left 04/27/2017   Procedure: KNEE ARTHROSCOPY WITH OSTEOCHONDRAL DEFECT REPAIR Autograft;  Surgeon: Vickki Hearing, MD;  Location: [facility];  Service: Orthopedics;  Laterality: Left;  . KNEE SURGERY    . LEFT HEART CATH AND  CORONARY ANGIOGRAPHY N/A 04/07/2020   Procedure: LEFT HEART CATH AND CORONARY ANGIOGRAPHY;  Surgeon: Corky Crafts, MD;  Location: [facility] INVASIVE CV LAB;  Service: Cardiovascular;  Laterality: N/A;  . PARTIAL COLECTOMY N/A 04/08/2015   Procedure: PARTIAL COLECTOMY;  Surgeon: Franky Macho, MD;  Location: [facility];  Service: General;  Laterality: N/A;  . right knee arthroscopy    . SMALL INTESTINE SURGERY N/A    Phreesia 08/10/2020  . TOTAL KNEE ARTHROPLASTY Left 10/17/2017   Procedure: TOTAL KNEE ARTHROPLASTY;  Surgeon: Vickki Hearing, MD;  Location: [facility];  Service: Orthopedics;  Laterality: Left;  DePuy   . TOTAL KNEE REVISION Left 10/20/2020   Procedure: REVISION TIBIAL COMPONENT LEFT TOTAL KNEE;  Surgeon: Vickki Hearing, MD;  Location: [facility];  Service: Orthopedics;  Laterality: Left;  . TUBAL LIGATION      Review of Systems  Constitutional:  Negative for chills and fever.  Respiratory:  Negative for shortness of breath.   Cardiovascular:  Negative for chest pain.  Genitourinary:  Negative for dysuria, flank pain, frequency, hematuria and urgency.      Objective    BP 115/75 (BP Location: Right Arm, Patient Position: Sitting, Cuff Size: Normal)   Pulse 78   Ht 5\' 5"  (1.651 m)   Wt 194 lb 0.6 oz (88 kg)   SpO2 94%   BMI 32.29 kg/m   Physical Exam Vitals reviewed.  Constitutional:      General: She is not in acute distress.    Appearance: Normal appearance. She is not ill-appearing, toxic-appearing or diaphoretic.  HENT:     Head: Normocephalic.  Eyes:     General:        Right eye: No discharge.        Left eye: No discharge.     Conjunctiva/sclera: Conjunctivae normal.  Cardiovascular:     Rate and Rhythm: Normal rate.     Pulses: Normal pulses.  Pulmonary:     Effort: Pulmonary effort is normal. No respiratory distress.     Breath sounds: Normal breath sounds.  Genitourinary:    General: Normal vulva.     Exam position: Lithotomy position.     Pubic  Area: No rash.      Labia:        Right: No rash.        Left: No rash.      Vagina: Normal.     Cervix: Normal.  Musculoskeletal:     Cervical back: Normal range of motion.  Skin:    General: Skin is warm and dry.  Neurological:     General: No focal deficit present.     Mental Status: She is alert.     Coordination: Coordination normal.     Gait: Gait normal.      Assessment & Plan:  Cervical cancer screening Assessment & Plan: Pap smear done, Patient tolerated procedure well. Informed patient I will keep them updated on results. Right breast normal without mass, skin or nipple changes or axillary nodes, left breast normal without mass, skin or nipple changes or axillary nodes.     No  follow-ups on file.   [name], FNP

## 2022-12-21 ENCOUNTER — Telehealth: Payer: Self-pay | Admitting: Orthopedic Surgery

## 2022-12-21 LAB — CYTOLOGY - PAP
Adequacy: ABSENT
Diagnosis: NEGATIVE

## 2022-12-21 NOTE — Telephone Encounter (Signed)
Patient called left message he has appt this Friday and needs to R/S it.   I Called the patient back lvm to call us back

## 2022-12-23 ENCOUNTER — Ambulatory Visit: Payer: 59 | Admitting: Orthopedic Surgery

## 2023-01-06 ENCOUNTER — Ambulatory Visit: Payer: 59 | Admitting: Orthopedic Surgery

## 2023-01-06 ENCOUNTER — Encounter: Payer: Self-pay | Admitting: Orthopedic Surgery

## 2023-01-06 DIAGNOSIS — Z96652 Presence of left artificial knee joint: Secondary | ICD-10-CM

## 2023-01-06 DIAGNOSIS — T84038D Mechanical loosening of other internal prosthetic joint, subsequent encounter: Secondary | ICD-10-CM | POA: Diagnosis not present

## 2023-01-06 DIAGNOSIS — G8929 Other chronic pain: Secondary | ICD-10-CM | POA: Diagnosis not present

## 2023-01-06 DIAGNOSIS — M25562 Pain in left knee: Secondary | ICD-10-CM

## 2023-01-06 MED ORDER — OXYCODONE HCL 5 MG PO TABS
5.0000 mg | ORAL_TABLET | ORAL | 0 refills | Status: DC | PRN
Start: 2023-01-06 — End: 2023-01-30

## 2023-01-06 NOTE — Progress Notes (Signed)
Follow-up appointment  Encounter Diagnoses  Name Primary?   Chronic pain of left knee    Mechanical loosening of prosthetic knee, subsequent encounter    S/P total knee replacement, left    Status post revision of total replacement of left knee    The patient is maintained on oxycodone 5 mg Q6 120 tablets/month  This is controlling her pain very well without side effects  Please see lab tab for most recent drug screen  Patient is functioning at a high level with this regiment we will continue  Meds ordered this encounter  Medications   oxyCODONE (OXY IR/ROXICODONE) 5 MG immediate release tablet    Sig: Take 1 tablet (5 mg total) by mouth every 4 (four) hours as needed for severe pain.    Dispense:  120 tablet    Refill:  0

## 2023-01-26 ENCOUNTER — Ambulatory Visit: Payer: 59 | Admitting: Nurse Practitioner

## 2023-01-29 ENCOUNTER — Encounter: Payer: Self-pay | Admitting: Orthopedic Surgery

## 2023-01-29 DIAGNOSIS — Z96659 Presence of unspecified artificial knee joint: Secondary | ICD-10-CM

## 2023-01-29 DIAGNOSIS — G8929 Other chronic pain: Secondary | ICD-10-CM

## 2023-01-29 DIAGNOSIS — Z96652 Presence of left artificial knee joint: Secondary | ICD-10-CM

## 2023-01-30 MED ORDER — OXYCODONE HCL 5 MG PO TABS
5.0000 mg | ORAL_TABLET | ORAL | 0 refills | Status: DC | PRN
Start: 2023-01-30 — End: 2023-02-23

## 2023-01-31 ENCOUNTER — Encounter: Payer: Self-pay | Admitting: Internal Medicine

## 2023-02-01 ENCOUNTER — Telehealth (INDEPENDENT_AMBULATORY_CARE_PROVIDER_SITE_OTHER): Payer: 59 | Admitting: Family Medicine

## 2023-02-01 ENCOUNTER — Encounter: Payer: Self-pay | Admitting: Family Medicine

## 2023-02-01 VITALS — Ht 65.0 in | Wt 182.0 lb

## 2023-02-01 DIAGNOSIS — J069 Acute upper respiratory infection, unspecified: Secondary | ICD-10-CM

## 2023-02-01 MED ORDER — NOREL AD 4-10-325 MG PO TABS
ORAL_TABLET | ORAL | 0 refills | Status: DC
Start: 2023-02-01 — End: 2023-03-06

## 2023-02-01 MED ORDER — PREDNISONE 20 MG PO TABS
20.0000 mg | ORAL_TABLET | Freq: Two times a day (BID) | ORAL | 0 refills | Status: AC
Start: 2023-02-01 — End: 2023-02-06

## 2023-02-01 MED ORDER — AZITHROMYCIN 250 MG PO TABS
ORAL_TABLET | ORAL | 0 refills | Status: DC
Start: 2023-02-01 — End: 2023-02-08

## 2023-02-01 NOTE — Progress Notes (Addendum)
Virtual Visit via Video Note  I connected with [name] on 02/01/23 at  4:20 PM EDT by a video enabled telemedicine application and verified that I am speaking with the correct person using two identifiers.  Patient Location: Home Provider Location: Office/Clinic  I discussed the limitations, risks, security, and privacy concerns of performing an evaluation and management service by video and the availability of in person appointments. I also discussed with the patient that there may be a patient responsible charge related to this service. The patient expressed understanding and agreed to proceed.  Subjective: PCP: [name], MD  Chief Complaint  Patient presents with   Cough    Patient complains of cough, congestion, sore throat, and R ear pain starting 4 days ago.    [name] 52 year old female presents via telehealth, with a primary complaint of a dry cough. She reports several associated symptoms including chest congestion, right ear pain, sinus pressure, wheezing, shortness of breath on exertion, fatigue, headache, malaise, muscle aches, and a sore throat. These symptoms began 3 days ago and have gradually worsened since onset. She describes the shortness of breath as occurring mostly with physical activity but denies any chest pain, nausea, or vomiting. The patient has attempted to manage her symptoms with over-the-counter analgesics/antipyretics and antitussives, but these treatments have provided minimal relief. She has no known recent exposures to infectious illnesses and has no history of recent travel.     ROS: Per HPI  Current Outpatient Medications:    atorvastatin (LIPITOR) 40 MG tablet, Take 1 tablet by mouth once daily, Disp: 90 tablet, Rfl: 3   azithromycin (ZITHROMAX) 250 MG tablet, Take 2 tablets on day 1, then 1 tablet daily on days 2 through 5, Disp: 6 tablet, Rfl: 0   carvedilol (COREG) 12.5 MG tablet, Take 1 tablet by mouth twice daily, Disp: 180  tablet, Rfl: 1   Chlorphen-PE-Acetaminophen (NOREL AD) 4-10-325 MG TABS, Take 1 tablet every 4 hours while symptoms persists. Do not take more than 6 tablets in 24 hours., Disp: 20 tablet, Rfl: 0   clopidogrel (PLAVIX) 75 MG tablet, Take 1 tablet by mouth once daily, Disp: 90 tablet, Rfl: 3   glucose blood (ONETOUCH VERIO) test strip, Use as instructed to monitor blood glucose once daily., Disp: 100 each, Rfl: 12   Lancets (ONETOUCH DELICA PLUS LANCET33G) MISC, by Does not apply route., Disp: , Rfl:    losartan (COZAAR) 50 MG tablet, Take 1 tablet by mouth once daily, Disp: 90 tablet, Rfl: 3   metFORMIN (GLUCOPHAGE-XR) 500 MG 24 hr tablet, Take 2 tablets (1,000 mg total) by mouth 2 (two) times daily with a meal., Disp: 360 tablet, Rfl: 3   nitroGLYCERIN (NITROSTAT) 0.4 MG SL tablet, PLACE 1 TABLET (0.4 MG TOTAL) UNDER THE TONGUE EVERY 5 (FIVE) MINUTES AS NEEDED FOR CHEST PAIN. (Patient taking differently: Place 0.4 mg under the tongue every 5 (five) minutes as needed for chest pain.), Disp: 25 tablet, Rfl: 12   oxyCODONE (OXY IR/ROXICODONE) 5 MG immediate release tablet, Take 1 tablet (5 mg total) by mouth every 4 (four) hours as needed for severe pain., Disp: 120 tablet, Rfl: 0   predniSONE (DELTASONE) 20 MG tablet, Take 1 tablet (20 mg total) by mouth 2 (two) times daily with a meal for 5 days., Disp: 10 tablet, Rfl: 0   promethazine (PHENERGAN) 12.5 MG tablet, TAKE 1 TABLET BY MOUTH EVERY 4 HOURS AS NEEDED FOR NAUSEA FOR VOMITING, Disp: 42  tablet, Rfl: 0  Observations/Objective: Today's Vitals   02/01/23 1512  Weight: 182 lb (82.6 kg)  Height: 5\' 5"  (1.651 m)   Physical Exam Patient is alert and no acute distress noted.   Assessment and Plan: Upper respiratory tract infection, unspecified type Assessment & Plan: Trial Azithromycin 250 mg x 5 days Prednisone 20 mg twice daily x 5 days Norel- AD Discussed symptomatic treatment, rest, increase oral fluid intake.Follow-up for worsening or  persistent symptoms. Patient verbalizes understanding regarding plan of care and all questions answered   Orders: -     COVID-19, Flu A+B and RSV -     predniSONE; Take 1 tablet (20 mg total) by mouth 2 (two) times daily with a meal for 5 days.  Dispense: 10 tablet; Refill: 0 -     Azithromycin; Take 2 tablets on day 1, then 1 tablet daily on days 2 through 5  Dispense: 6 tablet; Refill: 0 -     Norel AD; Take 1 tablet every 4 hours while symptoms persists. Do not take more than 6 tablets in 24 hours.  Dispense: 20 tablet; Refill: 0    Follow Up Instructions: No follow-ups on file.   I discussed the assessment and treatment plan with the patient. The patient was provided an opportunity to ask questions, and all were answered. The patient agreed with the plan and demonstrated an understanding of the instructions.   The patient was advised to call back or seek an in-person evaluation if the symptoms worsen or if the condition fails to improve as anticipated.  The above assessment and management plan was discussed with the patient. The patient verbalized understanding of and has agreed to the management plan.   [name], FNP

## 2023-02-01 NOTE — Assessment & Plan Note (Signed)
Trial Azithromycin 250 mg x 5 days Prednisone 20 mg twice daily x 5 days Norel- AD Discussed symptomatic treatment, rest, increase oral fluid intake.Follow-up for worsening or persistent symptoms. Patient verbalizes understanding regarding plan of care and all questions answered

## 2023-02-01 NOTE — Telephone Encounter (Signed)
Called patient left voicemail to contact our office. 

## 2023-02-03 LAB — COVID-19, FLU A+B AND RSV
Influenza A, NAA: NOT DETECTED
Influenza B, NAA: NOT DETECTED
RSV, NAA: NOT DETECTED
SARS-CoV-2, NAA: NOT DETECTED

## 2023-02-06 ENCOUNTER — Encounter: Payer: Self-pay | Admitting: Internal Medicine

## 2023-02-07 ENCOUNTER — Ambulatory Visit: Payer: 59

## 2023-02-08 ENCOUNTER — Encounter: Payer: Self-pay | Admitting: Family Medicine

## 2023-02-08 ENCOUNTER — Ambulatory Visit: Payer: 59 | Admitting: Family Medicine

## 2023-02-08 ENCOUNTER — Ambulatory Visit (HOSPITAL_COMMUNITY)
Admission: RE | Admit: 2023-02-08 | Discharge: 2023-02-08 | Disposition: A | Discharge status: Home / Self Care | Payer: 59 | Source: Ambulatory Visit | Attending: Family Medicine | Admitting: Family Medicine

## 2023-02-08 VITALS — BP 130/87 | HR 85 | Temp 98.8°F | Resp 16 | Ht 65.0 in | Wt 180.8 lb

## 2023-02-08 DIAGNOSIS — R051 Acute cough: Secondary | ICD-10-CM | POA: Diagnosis not present

## 2023-02-08 DIAGNOSIS — J069 Acute upper respiratory infection, unspecified: Secondary | ICD-10-CM | POA: Diagnosis not present

## 2023-02-08 DIAGNOSIS — R053 Chronic cough: Secondary | ICD-10-CM | POA: Diagnosis not present

## 2023-02-08 MED ORDER — BENZONATATE 200 MG PO CAPS
200.0000 mg | ORAL_CAPSULE | Freq: Two times a day (BID) | ORAL | 0 refills | Status: DC | PRN
Start: 2023-02-08 — End: 2023-03-06

## 2023-02-08 MED ORDER — MONTELUKAST SODIUM 10 MG PO TABS: 10.0000 mg | ORAL_TABLET | Freq: Every day | ORAL | 3 refills | Status: AC

## 2023-02-08 MED ORDER — CEFDINIR 300 MG PO CAPS
300.0000 mg | ORAL_CAPSULE | Freq: Two times a day (BID) | ORAL | 0 refills | Status: AC
Start: 2023-02-08 — End: 2023-02-18

## 2023-02-08 NOTE — Assessment & Plan Note (Signed)
Worsening after Azithromycin 250 mg x 5 days Prednisone 20 mg twice daily x 5 days Chest Xray ordered Started Cefdinir (Omnicef) 300 mg twice daily x 10 days Benzonatate 200 mg PRN,  Advise patient to rest to support your body's recovery. Stay hydrated by drinking water, tea, or broth. Using a humidifier can help soothe throat irritation and ease nasal congestion. For fever or pain, acetaminophen (Tylenol) is recommended. To relieve other symptoms, try saline nasal sprays, throat lozenges, or gargling with saltwater. Focus on eating light, healthy meals like fruits and vegetables to keep your strength up. Practice good hygiene by washing your hands frequently and covering your mouth when coughing or sneezing.Follow-up for worsening or persistent symptoms. Patient verbalizes understanding regarding plan of care and all questions answered

## 2023-02-08 NOTE — Patient Instructions (Addendum)
        Great to see you today.  I have refilled the medication(s) we provide.    - Please take medications as prescribed. - Follow up with your primary health provider if any health concerns arises. - If symptoms worsen please contact your primary care provider and/or visit the emergency department.  

## 2023-02-08 NOTE — Progress Notes (Signed)
Patient Office Visit   Subjective   Patient ID: [name], female    DOB: 1971/03/27  Age: 52 y.o. MRN: [id number]  CC:  Chief Complaint  Patient presents with   Cough    Had a tele visit last wed and took a round of antibiotics and prednisone and its not getting better. Not able to produce any mucus     HPI [name] 52 year old female, presents to the clinic for worsening cough. She  has a past medical history of Arthritis, Carpal tunnel syndrome, bilateral, Coronary artery disease, Diabetes mellitus without complication (HCC), Diverticulitis, Hyperlipidemia, Hypertension, Hypertriglyceridemia, Myocardial infarction (HCC), PONV (postoperative nausea and vomiting), and Sciatica.  Cough:  The patient reports a cough that began 2 weeks ago and has been gradually worsening. The cough is constant and non-productive. Associated symptoms include low grade fever, nasal congestion, postnasal drip, rhinorrhea, shortness of breath, and wheezing. The patient denies chest pain, headaches, or coughing up blood (hemoptysis). The cough is exacerbated by activity. Risk factors for lung disease include smoking and tobacco exposure. She has tried oral steroids, over-the-counter cough suppressants, and a beta-agonist inhaler (azithromycin) without relief. Her medical history is notable for bronchitis.      Outpatient Encounter Medications as of 02/08/2023  Medication Sig   atorvastatin (LIPITOR) 40 MG tablet Take 1 tablet by mouth once daily   benzonatate (TESSALON) 200 MG capsule Take 1 capsule (200 mg total) by mouth 2 (two) times daily as needed for cough.   carvedilol (COREG) 12.5 MG tablet Take 1 tablet by mouth twice daily   cefdinir (OMNICEF) 300 MG capsule Take 1 capsule (300 mg total) by mouth 2 (two) times daily for 10 days.   Chlorphen-PE-Acetaminophen (NOREL AD) 4-10-325 MG TABS Take 1 tablet every 4 hours while symptoms persists. Do not take more than 6 tablets in 24 hours.    clopidogrel (PLAVIX) 75 MG tablet Take 1 tablet by mouth once daily   glucose blood (ONETOUCH VERIO) test strip Use as instructed to monitor blood glucose once daily.   Lancets (ONETOUCH DELICA PLUS LANCET33G) MISC by Does not apply route.   losartan (COZAAR) 50 MG tablet Take 1 tablet by mouth once daily   metFORMIN (GLUCOPHAGE-XR) 500 MG 24 hr tablet Take 2 tablets (1,000 mg total) by mouth 2 (two) times daily with a meal.   montelukast (SINGULAIR) 10 MG tablet Take 1 tablet (10 mg total) by mouth at bedtime.   nitroGLYCERIN (NITROSTAT) 0.4 MG SL tablet PLACE 1 TABLET (0.4 MG TOTAL) UNDER THE TONGUE EVERY 5 (FIVE) MINUTES AS NEEDED FOR CHEST PAIN. (Patient taking differently: Place 0.4 mg under the tongue every 5 (five) minutes as needed for chest pain.)   oxyCODONE (OXY IR/ROXICODONE) 5 MG immediate release tablet Take 1 tablet (5 mg total) by mouth every 4 (four) hours as needed for severe pain.   promethazine (PHENERGAN) 12.5 MG tablet TAKE 1 TABLET BY MOUTH EVERY 4 HOURS AS NEEDED FOR NAUSEA FOR VOMITING   [DISCONTINUED] azithromycin (ZITHROMAX) 250 MG tablet Take 2 tablets on day 1, then 1 tablet daily on days 2 through 5 (Patient not taking: Reported on 02/08/2023)   [DISCONTINUED] pantoprazole (PROTONIX) 40 MG tablet Take 1 tablet (40 mg total) by mouth daily. Further refills by PCP (Patient not taking: Reported on 05/18/2020)   No facility-administered encounter medications on file as of 02/08/2023.    Past Surgical History:  Procedure Laterality Date   ablasion of uterus  CARDIAC CATHETERIZATION     CARPAL TUNNEL RELEASE  11/10/2010   Procedure: CARPAL TUNNEL RELEASE;  Surgeon: Fuller Canada, MD;  Location: [facility];  Service: Orthopedics;  Laterality: Right;   CHOLECYSTECTOMY     [facility]   COLON RESECTION N/A 04/08/2015   Procedure: LAPAROSCOPIC HAND ASSISTED PARTIAL COLECTOMY  CONVERTED TO OPEN AT 6387;  Surgeon: Franky Macho, MD;  Location: [facility];  Service: General;  Laterality:  N/A;   COLONOSCOPY N/A 03/28/2014   Procedure: COLONOSCOPY;  Surgeon: Malissa Hippo, MD;  Location: [facility];  Service: Endoscopy;  Laterality: N/A;  730   CORONARY STENT INTERVENTION  04/07/2020   CORONARY STENT INTERVENTION N/A 04/07/2020   Procedure: CORONARY STENT INTERVENTION;  Surgeon: Corky Crafts, MD;  Location: [facility];  Service: Cardiovascular;  Laterality: N/A;   JOINT REPLACEMENT N/A    Phreesia 08/10/2020   KNEE ARTHROSCOPY WITH DRILLING/MICROFRACTURE Left 10/20/2016   Procedure: LEFT KNEE ARTHROSCOPY WITH MICROFRACTURE;  Surgeon: Vickki Hearing, MD;  Location: [facility];  Service: Orthopedics;  Laterality: Left;   KNEE ARTHROSCOPY WITH DRILLING/MICROFRACTURE Left 04/27/2017   Procedure: KNEE ARTHROSCOPY WITH DRILLING/MICROFRACTURE;  Surgeon: Vickki Hearing, MD;  Location: [facility];  Service: Orthopedics;  Laterality: Left;   KNEE ARTHROSCOPY WITH OSTEOCHONDRAL DEFECT REPAIR Left 04/27/2017   Procedure: KNEE ARTHROSCOPY WITH OSTEOCHONDRAL DEFECT REPAIR Autograft;  Surgeon: Vickki Hearing, MD;  Location: [facility];  Service: Orthopedics;  Laterality: Left;   KNEE SURGERY     LEFT HEART CATH AND CORONARY ANGIOGRAPHY N/A 04/07/2020   Procedure: LEFT HEART CATH AND CORONARY ANGIOGRAPHY;  Surgeon: Corky Crafts, MD;  Location: [facility];  Service: Cardiovascular;  Laterality: N/A;   PARTIAL COLECTOMY N/A 04/08/2015   Procedure: PARTIAL COLECTOMY;  Surgeon: Franky Macho, MD;  Location: [facility];  Service: General;  Laterality: N/A;   right knee arthroscopy     SMALL INTESTINE SURGERY N/A    Phreesia 08/10/2020   TOTAL KNEE ARTHROPLASTY Left 10/17/2017   Procedure: TOTAL KNEE ARTHROPLASTY;  Surgeon: Vickki Hearing, MD;  Location: [facility];  Service: Orthopedics;  Laterality: Left;  DePuy    TOTAL KNEE REVISION Left 10/20/2020   Procedure: REVISION TIBIAL COMPONENT LEFT TOTAL KNEE;  Surgeon: Vickki Hearing, MD;  Location: [facility];   Service: Orthopedics;  Laterality: Left;   TUBAL LIGATION      Review of Systems  Constitutional:  Positive for fever and malaise/fatigue. Negative for chills.  HENT:  Positive for congestion and sore throat.   Eyes:  Negative for blurred vision.  Respiratory:  Positive for cough, shortness of breath and wheezing. Negative for hemoptysis and sputum production.   Cardiovascular:  Negative for chest pain.  Neurological:  Negative for dizziness and headaches.      Objective    BP 130/87   Pulse 85   Temp 98.8 F (37.1 C) (Oral)   Resp 16   Ht 5\' 5"  (1.651 m)   Wt 180 lb 12.8 oz (82 kg)   SpO2 95%   BMI 30.09 kg/m   Physical Exam Vitals reviewed.  Constitutional:      General: She is not in acute distress.    Appearance: Normal appearance. She is not ill-appearing, toxic-appearing or diaphoretic.  HENT:     Head: Normocephalic.  Eyes:     General:        Right eye: No discharge.        Left eye: No discharge.  Conjunctiva/sclera: Conjunctivae normal.  Cardiovascular:     Rate and Rhythm: Normal rate.     Pulses: Normal pulses.     Heart sounds: Normal heart sounds.  Pulmonary:     Effort: Respiratory distress present.     Breath sounds: Wheezing and rhonchi present.  Abdominal:     Tenderness: There is no left CVA tenderness.  Musculoskeletal:     Cervical back: Normal range of motion.  Skin:    General: Skin is warm and dry.     Capillary Refill: Capillary refill takes less than 2 seconds.  Neurological:     Mental Status: She is alert.  Psychiatric:        Mood and Affect: Mood normal.        Behavior: Behavior normal.       Assessment & Plan:  Acute cough -     Cefdinir; Take 1 capsule (300 mg total) by mouth 2 (two) times daily for 10 days.  Dispense: 20 capsule; Refill: 0 -     DG Chest 2 View; Future -     Benzonatate; Take 1 capsule (200 mg total) by mouth 2 (two) times daily as needed for cough.  Dispense: 20 capsule; Refill: 0  Upper  respiratory tract infection, unspecified type Assessment & Plan: Worsening after Azithromycin 250 mg x 5 days Prednisone 20 mg twice daily x 5 days Chest Xray ordered Started Cefdinir (Omnicef) 300 mg twice daily x 10 days Benzonatate 200 mg PRN,  Advise patient to rest to support your body's recovery. Stay hydrated by drinking water, tea, or broth. Using a humidifier can help soothe throat irritation and ease nasal congestion. For fever or pain, acetaminophen (Tylenol) is recommended. To relieve other symptoms, try saline nasal sprays, throat lozenges, or gargling with saltwater. Focus on eating light, healthy meals like fruits and vegetables to keep your strength up. Practice good hygiene by washing your hands frequently and covering your mouth when coughing or sneezing.Follow-up for worsening or persistent symptoms. Patient verbalizes understanding regarding plan of care and all questions answered     Other orders -     Montelukast Sodium; Take 1 tablet (10 mg total) by mouth at bedtime.  Dispense: 30 tablet; Refill: 3    Return if symptoms worsen or fail to improve.   [name], FNP

## 2023-02-09 ENCOUNTER — Encounter: Payer: Self-pay | Admitting: Family Medicine

## 2023-02-10 ENCOUNTER — Other Ambulatory Visit: Payer: Self-pay | Admitting: Cardiology

## 2023-02-17 ENCOUNTER — Encounter: Payer: Self-pay | Admitting: Internal Medicine

## 2023-02-17 ENCOUNTER — Ambulatory Visit: Payer: 59 | Admitting: Internal Medicine

## 2023-02-17 VITALS — BP 125/74 | HR 84 | Ht 65.0 in | Wt 183.2 lb

## 2023-02-17 DIAGNOSIS — E669 Obesity, unspecified: Secondary | ICD-10-CM | POA: Diagnosis not present

## 2023-02-17 DIAGNOSIS — E1169 Type 2 diabetes mellitus with other specified complication: Secondary | ICD-10-CM | POA: Diagnosis not present

## 2023-02-17 DIAGNOSIS — E66811 Obesity, class 1: Secondary | ICD-10-CM | POA: Diagnosis not present

## 2023-02-17 DIAGNOSIS — Z800 Family history of malignant neoplasm of digestive organs: Secondary | ICD-10-CM

## 2023-02-17 DIAGNOSIS — I10 Essential (primary) hypertension: Secondary | ICD-10-CM

## 2023-02-17 NOTE — Patient Instructions (Addendum)
It was a pleasure to see you today.  Thank you for giving [place] the opportunity to be involved in your care.  Below is a brief recap of your visit and next steps.  We will plan to see you again in 6 months.  Summary Gastroenterology referral placed Repeat labs See contact info below for lung cancer screening Follow up in 6 months  [phone] (lung cancer screening program)

## 2023-02-17 NOTE — Assessment & Plan Note (Signed)
Due for repeat screening colonoscopy.  Needs new referral to gastroenterology.  This was placed today.

## 2023-02-17 NOTE — Assessment & Plan Note (Signed)
Her weight today is 183 pounds, which is down 10 pounds since her last appointment.  She has made significant lifestyle modifications aimed at weight loss and was congratulated on her progress today.

## 2023-02-17 NOTE — Progress Notes (Signed)
Established Patient Office Visit  Subjective   Patient ID: [name], female    DOB: March 14, 1971  Age: 52 y.o. MRN: [id number]  Chief Complaint  Patient presents with   Diabetes    Three month follow up    URI    URI symptoms, can not get rid of, one more day of antibiotic   [name] returns to care today for routine follow-up.  She was last evaluated by me on 7/26.  No medication changes were made at that time and 76-month follow-up was arranged.  In the interim, she has been evaluated by orthopedic surgery in the setting of chronic left knee pain.  Most recently, evaluated on 10/16 for persistent URI.  She has been treated with azithromycin, prednisone, and Omnicef.  She will complete Omnicef tomorrow (10/26).  Today she states that URI symptoms are gradually resolving.  She does not have any acute concerns to discuss.  Past Medical History:  Diagnosis Date   Arthritis    Carpal tunnel syndrome, bilateral    Coronary artery disease    Diabetes mellitus without complication (HCC)    no meds; diet controlled   Diverticulitis    Hyperlipidemia    Phreesia 08/10/2020   Hypertension    Hypertriglyceridemia    Myocardial infarction (HCC)    Phreesia 08/10/2020   PONV (postoperative nausea and vomiting)    Sciatica    Past Surgical History:  Procedure Laterality Date   ablasion of uterus     CARDIAC CATHETERIZATION     CARPAL TUNNEL RELEASE  11/10/2010   Procedure: CARPAL TUNNEL RELEASE;  Surgeon: Fuller Canada, MD;  Location: [facility];  Service: Orthopedics;  Laterality: Right;   CHOLECYSTECTOMY     [facility]   COLON RESECTION N/A 04/08/2015   Procedure: LAPAROSCOPIC HAND ASSISTED PARTIAL COLECTOMY  CONVERTED TO OPEN AT 5784;  Surgeon: Franky Macho, MD;  Location: [facility];  Service: General;  Laterality: N/A;   COLONOSCOPY N/A 03/28/2014   Procedure: COLONOSCOPY;  Surgeon: Malissa Hippo, MD;  [facility];  Service: Endoscopy;  Laterality: N/A;  730   CORONARY  STENT INTERVENTION  04/07/2020   CORONARY STENT INTERVENTION N/A 04/07/2020   Procedure: CORONARY STENT INTERVENTION;  Surgeon: Corky Crafts, MD;  Location: [facility];  Service: Cardiovascular;  Laterality: N/A;   JOINT REPLACEMENT N/A    Phreesia 08/10/2020   KNEE ARTHROSCOPY WITH DRILLING/MICROFRACTURE Left 10/20/2016   Procedure: LEFT KNEE ARTHROSCOPY WITH MICROFRACTURE;  Surgeon: Vickki Hearing, MD;  Location: [facility];  Service: Orthopedics;  Laterality: Left;   KNEE ARTHROSCOPY WITH DRILLING/MICROFRACTURE Left 04/27/2017   Procedure: KNEE ARTHROSCOPY WITH DRILLING/MICROFRACTURE;  Surgeon: Vickki Hearing, MD;  Location: [facility];  Service: Orthopedics;  Laterality: Left;   KNEE ARTHROSCOPY WITH OSTEOCHONDRAL DEFECT REPAIR Left 04/27/2017   Procedure: KNEE ARTHROSCOPY WITH OSTEOCHONDRAL DEFECT REPAIR Autograft;  Surgeon: Vickki Hearing, MD;  Location: [facility];  Service: Orthopedics;  Laterality: Left;   KNEE SURGERY     LEFT HEART CATH AND CORONARY ANGIOGRAPHY N/A 04/07/2020   Procedure: LEFT HEART CATH AND CORONARY ANGIOGRAPHY;  Surgeon: Corky Crafts, MD;  [facility] CV LAB;  Service: Cardiovascular;  Laterality: N/A;   PARTIAL COLECTOMY N/A 04/08/2015   Procedure: PARTIAL COLECTOMY;  Surgeon: Franky Macho, MD;  Location: [facility];  Service: General;  Laterality: N/A;   right knee arthroscopy     SMALL INTESTINE SURGERY N/A    Phreesia 08/10/2020   TOTAL  KNEE ARTHROPLASTY Left 10/17/2017   Procedure: TOTAL KNEE ARTHROPLASTY;  Surgeon: Vickki Hearing, MD;  Location: [facility];  Service: Orthopedics;  Laterality: Left;  DePuy    TOTAL KNEE REVISION Left 10/20/2020   Procedure: REVISION TIBIAL COMPONENT LEFT TOTAL KNEE;  Surgeon: Vickki Hearing, MD;  Location: [facility];  Service: Orthopedics;  Laterality: Left;   TUBAL LIGATION     Social History   Tobacco Use   Smoking status: Every Day    Current packs/day: 0.00    Average packs/day: 1  pack/day for 26.0 years (26.0 ttl pk-yrs)    Types: Cigarettes    Start date: 04/05/1994    Last attempt to quit: 04/05/2020    Years since quitting: 2.8   Smokeless tobacco: Never  Vaping Use   Vaping status: Never Used  Substance Use Topics   Alcohol use: No    Alcohol/week: 0.0 standard drinks of alcohol   Drug use: No   Family History  Problem Relation Age of Onset   Diabetes type I Mother    Hyperlipidemia Mother    Diabetes type II Father    Hypertension Father    Hyperlipidemia Father    Diabetes type II Brother    Diabetes type II Brother    Cancer Other    Diabetes Other    Arthritis Other    Asthma Other    Anesthesia problems Neg Hx    Allergies  Allergen Reactions   Other    Flagyl [Metronidazole] Nausea Only   Hydrocodone Nausea And Vomiting and Other (See Comments)    Can take need to take with medication for nausea and vomitting   Review of Systems  HENT:  Positive for congestion.   All other systems reviewed and are negative.    Objective:     BP 125/74 (BP Location: Left Arm, Patient Position: Sitting, Cuff Size: Normal)   Pulse 84   Ht 5\' 5"  (1.651 m)   Wt 183 lb 3.2 oz (83.1 kg)   SpO2 95%   BMI 30.49 kg/m  BP Readings from Last 3 Encounters:  02/17/23 125/74  02/08/23 130/87  12/16/22 115/75   Physical Exam Vitals reviewed.  Constitutional:      General: She is not in acute distress.    Appearance: Normal appearance. She is obese. She is not toxic-appearing.  HENT:     Head: Normocephalic and atraumatic.     Right Ear: External ear normal.     Left Ear: External ear normal.     Nose: Congestion present. No rhinorrhea.     Mouth/Throat:     Mouth: Mucous membranes are moist.     Pharynx: Oropharynx is clear. No oropharyngeal exudate or posterior oropharyngeal erythema.  Eyes:     General: No scleral icterus.    Extraocular Movements: Extraocular movements intact.     Conjunctiva/sclera: Conjunctivae normal.     Pupils: Pupils  are equal, round, and reactive to light.  Cardiovascular:     Rate and Rhythm: Normal rate and regular rhythm.     Pulses: Normal pulses.     Heart sounds: Normal heart sounds. No murmur heard.    No friction rub. No gallop.  Pulmonary:     Effort: Pulmonary effort is normal.     Breath sounds: Normal breath sounds. No wheezing, rhonchi or rales.  Abdominal:     General: Abdomen is flat. Bowel sounds are normal. There is no distension.     Palpations: Abdomen is soft.  Tenderness: There is no abdominal tenderness.  Musculoskeletal:        General: No swelling. Normal range of motion.     Cervical back: Normal range of motion.     Right lower leg: No edema.     Left lower leg: No edema.  Lymphadenopathy:     Cervical: No cervical adenopathy.  Skin:    General: Skin is warm and dry.     Capillary Refill: Capillary refill takes less than 2 seconds.     Coloration: Skin is not jaundiced.  Neurological:     General: No focal deficit present.     Mental Status: She is alert and oriented to person, place, and time.  Psychiatric:        Mood and Affect: Mood normal.        Behavior: Behavior normal.   Last CBC Lab Results  Component Value Date   WBC 12.0 (H) 06/14/2022   HGB 13.2 06/14/2022   HCT 40.1 06/14/2022   MCV 88 06/14/2022   MCH 28.9 06/14/2022   RDW 13.1 06/14/2022   PLT 355 06/14/2022   Last metabolic panel Lab Results  Component Value Date   GLUCOSE 177 (H) 06/14/2022   NA 138 06/14/2022   K 4.5 06/14/2022   CL 102 06/14/2022   CO2 21 06/14/2022   BUN 17 06/14/2022   CREATININE 0.61 06/14/2022   EGFR 109 06/14/2022   CALCIUM 9.7 06/14/2022   PHOS 4.1 04/10/2015   PROT 6.6 11/11/2021   ALBUMIN 4.3 11/11/2021   LABGLOB 2.3 11/11/2021   AGRATIO 1.9 11/11/2021   BILITOT 0.5 11/11/2021   ALKPHOS 95 11/11/2021   AST 19 11/11/2021   ALT 36 (H) 11/11/2021   ANIONGAP 7 10/21/2020   Last lipids Lab Results  Component Value Date   CHOL 71 (L) 09/23/2022    HDL 23 (L) 09/23/2022   LDLCALC 25 09/23/2022   TRIG 126 09/23/2022   CHOLHDL 3.1 09/23/2022   Last hemoglobin A1c Lab Results  Component Value Date   HGBA1C 7.3 (H) 09/23/2022   Last thyroid functions Lab Results  Component Value Date   TSH 1.720 11/11/2021   Last vitamin D Lab Results  Component Value Date   VD25OH 30.2 06/14/2022   Last vitamin B12 and Folate Lab Results  Component Value Date   VITAMINB12 778 05/19/2020     Assessment & Plan:   Problem List Items Addressed This Visit       Hypertension    Remains adequately controlled on current antihypertensive regimen.  No medication changes are indicated today.      Type 2 diabetes mellitus with obesity (HCC)    A1c 7.3 on labs from May.  She is currently prescribed metformin XR 1000 mg twice daily and is focused on lifestyle modifications aimed at weight loss and lowering her blood sugar.  She has lost 10 pounds since her last appointment. -Patient was congratulated on her progress with weight loss.  No medication changes have been made today.  Repeat A1c and urine microalbumin/creatinine ratio ordered.      Family hx of colon cancer requiring screening colonoscopy - Primary    Due for repeat screening colonoscopy.  Needs new referral to gastroenterology.  This was placed today.      Obesity (BMI 30.0-34.9)    Her weight today is 183 pounds, which is down 10 pounds since her last appointment.  She has made significant lifestyle modifications aimed at weight loss and was congratulated on her  progress today.      Return in about 6 months (around 08/18/2023).   [name], MD

## 2023-02-17 NOTE — Assessment & Plan Note (Signed)
A1c 7.3 on labs from May.  She is currently prescribed metformin XR 1000 mg twice daily and is focused on lifestyle modifications aimed at weight loss and lowering her blood sugar.  She has lost 10 pounds since her last appointment. -Patient was congratulated on her progress with weight loss.  No medication changes have been made today.  Repeat A1c and urine microalbumin/creatinine ratio ordered.

## 2023-02-17 NOTE — Assessment & Plan Note (Signed)
 Remains adequately controlled on current antihypertensive regimen.  No medication changes are indicated today.

## 2023-02-19 LAB — MICROALBUMIN / CREATININE URINE RATIO
Creatinine, Urine: 140.8 mg/dL
Microalb/Creat Ratio: 11 mg/g{creat} (ref 0–29)
Microalbumin, Urine: 15.6 ug/mL

## 2023-02-20 LAB — BAYER DCA HB A1C WAIVED: HB A1C (BAYER DCA - WAIVED): 5.9 % — ABNORMAL HIGH (ref 4.8–5.6)

## 2023-02-23 ENCOUNTER — Encounter: Payer: Self-pay | Admitting: Orthopedic Surgery

## 2023-02-23 DIAGNOSIS — G8929 Other chronic pain: Secondary | ICD-10-CM

## 2023-02-23 DIAGNOSIS — Z96659 Presence of unspecified artificial knee joint: Secondary | ICD-10-CM

## 2023-02-23 DIAGNOSIS — T84038D Mechanical loosening of other internal prosthetic joint, subsequent encounter: Secondary | ICD-10-CM

## 2023-02-23 DIAGNOSIS — Z96652 Presence of left artificial knee joint: Secondary | ICD-10-CM

## 2023-02-23 MED ORDER — OXYCODONE HCL 5 MG PO TABS
5.0000 mg | ORAL_TABLET | ORAL | 0 refills | Status: DC | PRN
Start: 2023-02-23 — End: 2023-03-20

## 2023-02-24 NOTE — Progress Notes (Unsigned)
GI Office Note    Referring Provider: [name], MD Primary Care Physician:  [name], MD  Primary Gastroenterologist: Dr. Tasia Catchings  Chief Complaint   No chief complaint on file.   History of Present Illness   [name] is a 52 y.o. female presenting today at the request of Durwin Nora Lucina Mellow, MD for office visit prior to scheduling colonoscopy.  Last colonoscopy December 2015 with Dr. Karilyn Cota: -Few small sigmoid diverticula -7 mm polyp in the distal sigmoid colon -Small polyp from distal sigmoid colon -Normal rectal mucosa -Small hemorrhoids below the dentate line -Path -hyperplastic polyps -Advised repeat in 5 years given family history   Today:    Current Outpatient Medications  Medication Sig Dispense Refill   atorvastatin (LIPITOR) 40 MG tablet Take 1 tablet by mouth once daily 90 tablet 3   benzonatate (TESSALON) 200 MG capsule Take 1 capsule (200 mg total) by mouth 2 (two) times daily as needed for cough. 20 capsule 0   carvedilol (COREG) 12.5 MG tablet Take 1 tablet by mouth twice daily 60 tablet 0   Chlorphen-PE-Acetaminophen (NOREL AD) 4-10-325 MG TABS Take 1 tablet every 4 hours while symptoms persists. Do not take more than 6 tablets in 24 hours. 20 tablet 0   clopidogrel (PLAVIX) 75 MG tablet Take 1 tablet by mouth once daily 90 tablet 3   glucose blood (ONETOUCH VERIO) test strip Use as instructed to monitor blood glucose once daily. 100 each 12   Lancets (ONETOUCH DELICA PLUS LANCET33G) MISC by Does not apply route.     losartan (COZAAR) 50 MG tablet Take 1 tablet by mouth once daily 90 tablet 3   metFORMIN (GLUCOPHAGE-XR) 500 MG 24 hr tablet Take 2 tablets (1,000 mg total) by mouth 2 (two) times daily with a meal. 360 tablet 3   montelukast (SINGULAIR) 10 MG tablet Take 1 tablet (10 mg total) by mouth at bedtime. 30 tablet 3   nitroGLYCERIN (NITROSTAT) 0.4 MG SL tablet PLACE 1 TABLET (0.4 MG TOTAL) UNDER THE TONGUE EVERY 5 (FIVE) MINUTES AS  NEEDED FOR CHEST PAIN. (Patient taking differently: Place 0.4 mg under the tongue every 5 (five) minutes as needed for chest pain.) 25 tablet 12   oxyCODONE (OXY IR/ROXICODONE) 5 MG immediate release tablet Take 1 tablet (5 mg total) by mouth every 4 (four) hours as needed for severe pain (pain score 7-10). 120 tablet 0   promethazine (PHENERGAN) 12.5 MG tablet TAKE 1 TABLET BY MOUTH EVERY 4 HOURS AS NEEDED FOR NAUSEA FOR VOMITING 42 tablet 0   No current facility-administered medications for this visit.    Past Medical History:  Diagnosis Date   Arthritis    Carpal tunnel syndrome, bilateral    Coronary artery disease    Diabetes mellitus without complication (HCC)    no meds; diet controlled   Diverticulitis    Hyperlipidemia    Phreesia 08/10/2020   Hypertension    Hypertriglyceridemia    Myocardial infarction (HCC)    Phreesia 08/10/2020   PONV (postoperative nausea and vomiting)    Sciatica     Past Surgical History:  Procedure Laterality Date   ablasion of uterus     CARDIAC CATHETERIZATION     CARPAL TUNNEL RELEASE  11/10/2010   Procedure: CARPAL TUNNEL RELEASE;  Surgeon: Fuller Canada, MD;  Location: [facility];  Service: Orthopedics;  Laterality: Right;   CHOLECYSTECTOMY     APH   COLON RESECTION N/A 04/08/2015   Procedure:  LAPAROSCOPIC HAND ASSISTED PARTIAL COLECTOMY  CONVERTED TO OPEN AT 5643;  Surgeon: Franky Macho, MD;  Location: [facility];  Service: General;  Laterality: N/A;   COLONOSCOPY N/A 03/28/2014   Procedure: COLONOSCOPY;  Surgeon: Malissa Hippo, MD;  [facility];  Service: Endoscopy;  Laterality: N/A;  730   CORONARY STENT INTERVENTION  04/07/2020   CORONARY STENT INTERVENTION N/A 04/07/2020   Procedure: CORONARY STENT INTERVENTION;  Surgeon: Corky Crafts, MD;  Location: [facility];  Service: Cardiovascular;  Laterality: N/A;   JOINT REPLACEMENT N/A    Phreesia 08/10/2020   KNEE ARTHROSCOPY WITH DRILLING/MICROFRACTURE Left  10/20/2016   Procedure: LEFT KNEE ARTHROSCOPY WITH MICROFRACTURE;  Surgeon: Vickki Hearing, MD;  Location: [facility];  Service: Orthopedics;  Laterality: Left;   KNEE ARTHROSCOPY WITH DRILLING/MICROFRACTURE Left 04/27/2017   Procedure: KNEE ARTHROSCOPY WITH DRILLING/MICROFRACTURE;  Surgeon: Vickki Hearing, MD;  Location: [facility];  Service: Orthopedics;  Laterality: Left;   KNEE ARTHROSCOPY WITH OSTEOCHONDRAL DEFECT REPAIR Left 04/27/2017   Procedure: KNEE ARTHROSCOPY WITH OSTEOCHONDRAL DEFECT REPAIR Autograft;  Surgeon: Vickki Hearing, MD;  Location: [facility];  Service: Orthopedics;  Laterality: Left;   KNEE SURGERY     LEFT HEART CATH AND CORONARY ANGIOGRAPHY N/A 04/07/2020   Procedure: LEFT HEART CATH AND CORONARY ANGIOGRAPHY;  Surgeon: Corky Crafts, MD;  Location: [facility];  Service: Cardiovascular;  Laterality: N/A;   PARTIAL COLECTOMY N/A 04/08/2015   Procedure: PARTIAL COLECTOMY;  Surgeon: Franky Macho, MD;  Location: [facility];  Service: General;  Laterality: N/A;   right knee arthroscopy     SMALL INTESTINE SURGERY N/A    Phreesia 08/10/2020   TOTAL KNEE ARTHROPLASTY Left 10/17/2017   Procedure: TOTAL KNEE ARTHROPLASTY;  Surgeon: Vickki Hearing, MD;  Location: [facility];  Service: Orthopedics;  Laterality: Left;  DePuy    TOTAL KNEE REVISION Left 10/20/2020   Procedure: REVISION TIBIAL COMPONENT LEFT TOTAL KNEE;  Surgeon: Vickki Hearing, MD;  Location: [facility];  Service: Orthopedics;  Laterality: Left;   TUBAL LIGATION      Family History  Problem Relation Age of Onset   Diabetes type I Mother    Hyperlipidemia Mother    Diabetes type II Father    Hypertension Father    Hyperlipidemia Father    Diabetes type II Brother    Diabetes type II Brother    Cancer Other    Diabetes Other    Arthritis Other    Asthma Other    Anesthesia problems Neg Hx     Allergies as of 02/27/2023 - Review Complete 02/17/2023  Allergen Reaction Noted   Other   08/10/2020   Flagyl [metronidazole] Nausea Only 01/08/2015   Hydrocodone Nausea And Vomiting and Other (See Comments)     Social History   Socioeconomic History   Marital status: Divorced    Spouse name: Not on file   Number of children: Not on file   Years of education: college   Highest education level: Not on file  Occupational History   Occupation: nurse    Employer: [employer]  Tobacco Use   Smoking status: Every Day    Current packs/day: 0.00    Average packs/day: 1 pack/day for 26.0 years (26.0 ttl pk-yrs)    Types: Cigarettes    Start date: 04/05/1994    Last attempt to quit: 04/05/2020    Years since quitting: 2.8   Smokeless tobacco: Never  Vaping Use  Vaping status: Never Used  Substance and Sexual Activity   Alcohol use: No    Alcohol/week: 0.0 standard drinks of alcohol   Drug use: No   Sexual activity: Yes    Birth control/protection: Surgical  Other Topics Concern   Not on file  Social History Narrative   Not on file   Social Determinants of Health   Financial Resource Strain: Not on file  Food Insecurity: Not on file  Transportation Needs: Not on file  Physical Activity: Not on file  Stress: Not on file  Social Connections: Not on file  Intimate Partner Violence: Not on file     Review of Systems   Gen: Denies any fever, chills, fatigue, weight loss, lack of appetite.  CV: Denies chest pain, heart palpitations, peripheral edema, syncope.  Resp: Denies shortness of breath at rest or with exertion. Denies wheezing or cough.  GI: see HPI GU : Denies urinary burning, urinary frequency, urinary hesitancy MS: Denies joint pain, muscle weakness, cramps, or limitation of movement.  Derm: Denies rash, itching, dry skin Psych: Denies depression, anxiety, memory loss, and confusion Heme: Denies bruising, bleeding, and enlarged lymph nodes.   Physical Exam   There were no vitals taken for this visit.  General:   Alert and oriented. Pleasant and  cooperative. Well-nourished and well-developed.  Head:  Normocephalic and atraumatic. Eyes:  Without icterus, sclera clear and conjunctiva pink.  Ears:  Normal auditory acuity. Mouth:  No deformity or lesions, oral mucosa pink.  Lungs:  Clear to auscultation bilaterally. No wheezes, rales, or rhonchi. No distress.  Heart:  S1, S2 present without murmurs appreciated.  Abdomen:  +BS, soft, non-tender and non-distended. No HSM noted. No guarding or rebound. No masses appreciated.  Rectal:  Deferred  Msk:  Symmetrical without gross deformities. Normal posture. Extremities:  Without edema. Neurologic:  Alert and  oriented x4;  grossly normal neurologically. Skin:  Intact without significant lesions or rashes. Psych:  Alert and cooperative. Normal mood and affect.   Assessment   [name] is a 52 y.o. female with a history of CAD s/p DES in 2021, NSTEMI, HTN, fatty liver, type 2 diabetes, HLD, arthritis, diverticulitis presenting today for evaluation prior to scheduling surveillance colonoscopy  History of colon polyps:   PLAN   *** Proceed with colonoscopy with propofol by Dr. Tasia Catchings in near future: the risks, benefits, and alternatives have been discussed with the patient in detail. The patient states understanding and desires to proceed. ASA 3    Brooke Bonito, MSN, FNP-BC, AGACNP-BC [facility]

## 2023-02-27 ENCOUNTER — Telehealth: Payer: Self-pay | Admitting: *Deleted

## 2023-02-27 ENCOUNTER — Ambulatory Visit: Payer: 59 | Admitting: Gastroenterology

## 2023-02-27 ENCOUNTER — Encounter: Payer: Self-pay | Admitting: Gastroenterology

## 2023-02-27 VITALS — BP 130/86 | HR 77 | Temp 97.9°F | Ht 65.0 in | Wt 183.2 lb

## 2023-02-27 DIAGNOSIS — Z860100 Personal history of colon polyps, unspecified: Secondary | ICD-10-CM

## 2023-02-27 DIAGNOSIS — Z800 Family history of malignant neoplasm of digestive organs: Secondary | ICD-10-CM

## 2023-02-27 NOTE — Patient Instructions (Signed)
We are scheduling you for a colonoscopy in the near future with Dr. Tasia Catchings.  You will receive separate detailed written instructions regarding your prep.  We will be in touch to schedule after we obtain clearance from cardiology to hold her Plavix 5 days prior.  It was a pleasure to see you today. I want to create trusting relationships with patients. If you receive a survey regarding your visit,  I greatly appreciate you taking time to fill this out on paper or through your MyChart. I value your feedback.  Brooke Bonito, MSN, FNP-BC, AGACNP-BC [facility]

## 2023-02-27 NOTE — Telephone Encounter (Signed)
    02/27/23  [name] 1970-11-02  What type of surgery is being performed? COLONOSCOPY  When is surgery scheduled? TBD  What type of clearance is required (medical or pharmacy to hold medication or both? Medication  Are there any medications that need to be held prior to surgery and how long? PLAVIX X 5 DAYS  Name of physician performing surgery?  Dr. Starleen Arms [facility] at [facility] Phone: [phone] Fax: [phone]  Anethesia type (none, local, MAC, general)? MAC

## 2023-02-28 NOTE — Telephone Encounter (Signed)
   Name: [name]  DOB: January 06, 1971  MRN: [id number]  Primary Cardiologist: Dina Rich, MD  Chart reviewed as part of pre-operative protocol coverage. Because of [name] past medical history and time since last visit, she will require a follow-up in-office visit in order to better assess preoperative cardiovascular risk.  Patient has an office visit scheduled on 03/06/2023 with Sharlene Dory, NP. Appointment notes have been updated to reflect need for pre-op evaluation.   Pre-op covering staff:  - Please contact requesting surgeon's office via preferred method (i.e, phone, fax) to inform them of need for appointment prior to surgery.  Carlos Levering, NP  02/28/2023, 3:02 PM

## 2023-02-28 NOTE — Telephone Encounter (Signed)
Patient already has a scheduled appointment with Sharlene Dory, NP on 11/11.

## 2023-03-02 ENCOUNTER — Encounter: Payer: Self-pay | Admitting: Gastroenterology

## 2023-03-06 ENCOUNTER — Ambulatory Visit: Payer: 59 | Attending: Nurse Practitioner | Admitting: Nurse Practitioner

## 2023-03-06 ENCOUNTER — Encounter: Payer: Self-pay | Admitting: Nurse Practitioner

## 2023-03-06 VITALS — BP 118/70 | HR 85 | Ht 65.0 in | Wt 176.0 lb

## 2023-03-06 DIAGNOSIS — E785 Hyperlipidemia, unspecified: Secondary | ICD-10-CM | POA: Diagnosis not present

## 2023-03-06 DIAGNOSIS — Z01810 Encounter for preprocedural cardiovascular examination: Secondary | ICD-10-CM

## 2023-03-06 DIAGNOSIS — I2510 Atherosclerotic heart disease of native coronary artery without angina pectoris: Secondary | ICD-10-CM | POA: Diagnosis not present

## 2023-03-06 DIAGNOSIS — I10 Essential (primary) hypertension: Secondary | ICD-10-CM

## 2023-03-06 DIAGNOSIS — Z720 Tobacco use: Secondary | ICD-10-CM

## 2023-03-06 MED ORDER — NITROGLYCERIN 0.4 MG SL SUBL: SUBLINGUAL_TABLET | SUBLINGUAL | 12 refills | Status: DC | PRN

## 2023-03-06 NOTE — Progress Notes (Unsigned)
Cardiology Office Note:    Date:  04/20/2022  ID:  [name], DOB [birth year], MRN [id number]  PCP:  [name], MD    [facility] Providers Cardiologist:  Dina Rich, MD     Referring MD: Gardenia Phlegm, MD   CC: Here for follow-up  History of Present Illness:    [name] is a 52 y.o. female with a hx of the following:  CAD, status post NSTEMI with prior stenting Hypertension Type 2 diabetes History of chest pain HLD Tobacco abuse  Patient is a very pleasant 52 year old female with past medical history as mentioned above.  History of cardiac catheterization in 2021 that revealed mild diffuse LAD disease, RCA proximal 70%, left circumflex mid 99% and distal 50%.  Was treated with drug-eluting stent to the left circumflex as well as to the RCA.  It was recommended to continue Plavix as monotherapy after 1 year due to diffuse CAD.  Echocardiogram at that time revealed EF 65 to 70%, no RWMA.  History of not being able to tolerate atorvastatin (myalgias).   Last seen by Cadence Lorna Few on February 07, 2022.  She was doing well from a cardiac perspective.  Noted occasional chest tightness feeling, not associated with exertion.  Denies shortness of breath.  Noted new lower leg edema prior to this office visit.  Noted it to be worse at night, resolved in the morning.  Was not following a low salt diet.  Denied any other cardiac issue or complaint.  Low-salt diet, compression socks, and leg elevation were discussed.  Updated echocardiogram revealed normal EF, no significant valvular abnormalities. Was told to follow-up in 1 to 2 months.  Today she presents for follow-up.  She states she is doing well.  Denies any chest pain, shortness of breath, palpitations, syncope, presyncope, dizziness, orthopnea, PND, swelling, significant weight changes, acute bleeding, or claudication.  BP well-controlled at home.  Does smoke around 1 pack/day.  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.  Denies any other questions or concerns today.  Past Medical History:  Diagnosis Date   Arthritis    Carpal tunnel syndrome, bilateral    Coronary artery disease    Diabetes mellitus without complication (HCC)    no meds; diet controlled   Diverticulitis    Hyperlipidemia    Phreesia 08/10/2020   Hypertension    Hypertriglyceridemia    Myocardial infarction (HCC)    Phreesia 08/10/2020   PONV (postoperative nausea and vomiting)    Sciatica     Past Surgical History:  Procedure Laterality Date   ablasion of uterus     CARDIAC CATHETERIZATION     CARPAL TUNNEL RELEASE  11/10/2010   Procedure: CARPAL TUNNEL RELEASE;  Surgeon: Fuller Canada, MD;  Location: [facility];  Service: Orthopedics;  Laterality: Right;   CHOLECYSTECTOMY     APH   COLON RESECTION N/A 04/08/2015   Procedure: LAPAROSCOPIC HAND ASSISTED PARTIAL COLECTOMY  CONVERTED TO OPEN AT 6606;  Surgeon: Franky Macho, MD;  Location: [facility];  Service: General;  Laterality: N/A;   COLONOSCOPY N/A 03/28/2014   Procedure: COLONOSCOPY;  Surgeon: Malissa Hippo, MD;  [facility];  Service: Endoscopy;  Laterality: N/A;  730   CORONARY STENT INTERVENTION  04/07/2020   CORONARY STENT INTERVENTION N/A 04/07/2020   Procedure: CORONARY STENT INTERVENTION;  Surgeon: Corky Crafts, MD;  [facility]: [facility];  Service: Cardiovascular;  Laterality: N/A;   JOINT REPLACEMENT  N/A    Phreesia 08/10/2020   KNEE ARTHROSCOPY WITH DRILLING/MICROFRACTURE Left 10/20/2016   Procedure: LEFT KNEE ARTHROSCOPY WITH MICROFRACTURE;  Surgeon: Vickki Hearing, MD;  Location: [facility];  Service: Orthopedics;  Laterality: Left;   KNEE ARTHROSCOPY WITH DRILLING/MICROFRACTURE Left 04/27/2017   Procedure: KNEE ARTHROSCOPY WITH DRILLING/MICROFRACTURE;  Surgeon: Vickki Hearing, MD;  Location: [facility];  Service: Orthopedics;  Laterality: Left;   KNEE ARTHROSCOPY WITH OSTEOCHONDRAL DEFECT REPAIR Left  04/27/2017   Procedure: KNEE ARTHROSCOPY WITH OSTEOCHONDRAL DEFECT REPAIR Autograft;  Surgeon: Vickki Hearing, MD;  Location: [facility];  Service: Orthopedics;  Laterality: Left;   KNEE SURGERY     LEFT HEART CATH AND CORONARY ANGIOGRAPHY N/A 04/07/2020   Procedure: LEFT HEART CATH AND CORONARY ANGIOGRAPHY;  Surgeon: Corky Crafts, MD;  Location: [facility] INVASIVE CV LAB;  Service: Cardiovascular;  Laterality: N/A;   PARTIAL COLECTOMY N/A 04/08/2015   Procedure: PARTIAL COLECTOMY;  Surgeon: Franky Macho, MD;  Location: [facility];  Service: General;  Laterality: N/A;   right knee arthroscopy     SMALL INTESTINE SURGERY N/A    Phreesia 08/10/2020   TOTAL KNEE ARTHROPLASTY Left 10/17/2017   Procedure: TOTAL KNEE ARTHROPLASTY;  Surgeon: Vickki Hearing, MD;  Location: [facility];  Service: Orthopedics;  Laterality: Left;  DePuy    TOTAL KNEE REVISION Left 10/20/2020   Procedure: REVISION TIBIAL COMPONENT LEFT TOTAL KNEE;  Surgeon: Vickki Hearing, MD;  Location: [facility];  Service: Orthopedics;  Laterality: Left;   TUBAL LIGATION      Current Medications: No outpatient medications have been marked as taking for the 03/06/23 encounter (Appointment) with Sharlene Dory, NP.     Allergies:   Other, Flagyl [metronidazole], and Hydrocodone   Social History   Socioeconomic History   Marital status: Divorced    Spouse name: Not on file   Number of children: Not on file   Years of education: college   Highest education level: Not on file  Occupational History   Occupation: nurse    Employer: [employer]  Tobacco Use   Smoking status: Every Day    Current packs/day: 0.00    Average packs/day: 1 pack/day for 26.0 years (26.0 ttl pk-yrs)    Types: Cigarettes    Start date: 04/05/1994    Last attempt to quit: 04/05/2020    Years since quitting: 2.9   Smokeless tobacco: Never  Vaping Use   Vaping status: Never Used  Substance and Sexual Activity   Alcohol use: No    Alcohol/week: 0.0  standard drinks of alcohol   Drug use: No   Sexual activity: Yes    Birth control/protection: Surgical  Other Topics Concern   Not on file  Social History Narrative   Not on file   Social Determinants of Health   Financial Resource Strain: Not on file  Food Insecurity: Not on file  Transportation Needs: Not on file  Physical Activity: Not on file  Stress: Not on file  Social Connections: Not on file     Family History: The patient's family history includes Arthritis in an other family member; Asthma in an other family member; Cancer in an other family member; Diabetes in an other family member; Diabetes type I in her mother; Diabetes type II in her brother, brother, and father; Hyperlipidemia in her father and mother; Hypertension in her father; Rectal cancer in her brother. There is no history of Anesthesia problems.  ROS:   Review of Systems  Constitutional: Negative.  HENT: Negative.    Eyes: Negative.   Respiratory: Negative.    Cardiovascular: Negative.   Gastrointestinal: Negative.   Genitourinary: Negative.   Musculoskeletal: Negative.   Skin: Negative.   Neurological: Negative.   Endo/Heme/Allergies: Negative.   Psychiatric/Behavioral: Negative.      Please see the history of present illness.    All other systems reviewed and are negative.  EKGs/Labs/Other Studies Reviewed:    The following studies were reviewed today:   EKG:  EKG is not ordered today.    2D echocardiogram on February 14, 2022:  1. Left ventricular ejection fraction, by estimation, is 60 to 65%. The  left ventricle has normal function. The left ventricle has no regional  wall motion abnormalities. Left ventricular diastolic parameters were  normal.   2. Right ventricular systolic function is normal. The right ventricular  size is normal. Tricuspid regurgitation signal is inadequate for assessing  PA pressure.   3. The mitral valve is normal in structure. No evidence of mitral valve   regurgitation. No evidence of mitral stenosis.   4. The aortic valve is tricuspid. Aortic valve regurgitation is not  visualized. No aortic stenosis is present.   5. The inferior vena cava is normal in size with greater than 50%  respiratory variability, suggesting right atrial pressure of 3 mmHg.  Left heart cath and coronary angiography on April 07, 2020: Dist Cx lesion is 50% stenosed. Mid Cx lesion is 99% stenosed. This was the culprit lesion for presentation. A drug-eluting stent was successfully placed using a STENT RESOLUTE ONYX 3.0X22, postdilated to 3.75 mm, and optimized by IVUS Post intervention, there is a 0% residual stenosis. Prox RCA lesion is 70% stenosed. Cross sectional area 3.9 mm2 by IVUS. A drug-eluting stent was successfully placed using a STENT RESOLUTE ONYX 3.5X12, postdilated to > 4 mm and optimized by IVUS. RPDA lesion is 50% stenosed. Post intervention, there is a 0% residual stenosis. The left ventricular systolic function is normal. LV end diastolic pressure is mildly elevated. The left ventricular ejection fraction is 55-65% by visual estimate. There is no aortic valve stenosis.   Consider clopidogrel monotherapy after 12 months of DAPT given her diffuse distal vessel disease.    Continue aggressive secondary prevention.    Recent Labs: 06/14/2022: BUN 17; Creatinine, Ser 0.61; Hemoglobin 13.2; Platelets 355; Potassium 4.5; Sodium 138  Recent Lipid Panel    Component Value Date/Time   CHOL 71 (L) 09/23/2022 1531   TRIG 126 09/23/2022 1531   HDL 23 (L) 09/23/2022 1531   CHOLHDL 3.1 09/23/2022 1531   CHOLHDL 3.9 05/19/2020 0903   VLDL 25 05/19/2020 0903   LDLCALC 25 09/23/2022 1531   Physical Exam:    VS:  There were no vitals taken for this visit.    Wt Readings from Last 3 Encounters:  02/27/23 183 lb 3.2 oz (83.1 kg)  02/17/23 183 lb 3.2 oz (83.1 kg)  02/08/23 180 lb 12.8 oz (82 kg)     GEN: Obese, 52 y.o. female in no acute  distress HEENT: Normal NECK: No JVD; No carotid bruits CARDIAC: S1/S2, RRR, no murmurs, rubs, gallops; 2+ peripheral pulses throughout, strong bilateral RESPIRATORY:  Clear to auscultation without rales, wheezing or rhonchi  MUSCULOSKELETAL:  No edema; No deformity  SKIN: Warm and dry NEUROLOGIC:  Alert and oriented x 3 PSYCHIATRIC:  Normal affect   ASSESSMENT:    No diagnosis found.  PLAN:    In order of problems listed above:   Pre-operative cardiovascular  risk assessment  Ms. Pehrson's perioperative risk of a major cardiac event is 0.9% according to the Revised Cardiac Risk Index (RCRI).  Therefore, she is at low risk for perioperative complications.   Her functional capacity is excellent at 8.97 METs according to the Duke Activity Status Index (DASI). Recommendations: According to ACC/AHA guidelines, no further cardiovascular testing needed.  The patient may proceed to surgery at acceptable risk.   Antiplatelet and/or Anticoagulation Recommendations: Clopidogrel (Plavix) can be held for 5 days prior to her surgery and resumed as soon as possible post op. {Anticoagulation Recommendations           :[id number]}    CAD, s/p NSTEMI with prior stenting Stable with no anginal symptoms. No indication for ischemic evaluation.  Continue atorvastatin, carvedilol, Plavix, losartan, and nitroglycerin as needed. Heart healthy diet and regular cardiovascular exercise encouraged.   HTN Blood pressure today 126/72.  BP well-controlled at home.  Continue current medication regimen. Discussed to monitor BP at home at least 2 hours after medications and sitting for 5-10 minutes. Heart healthy diet and regular cardiovascular exercise encouraged.   HLD Lipid profile from July 2023 revealed total cholesterol 118, triglycerides 128, HDL 28, and LDL 67.  Continue atorvastatin. Heart healthy diet and regular cardiovascular exercise encouraged.   Tobacco abuse Smokes around 1 pack/day.  Smoking  cessation encouraged and discussed.  I offered resources for smoking cessation, however she politely declines at this time.   Obesity BMI today 35.94. Weight loss via diet and exercise encouraged. Discussed the impact being overweight would have on cardiovascular risk. Heart healthy diet and regular cardiovascular exercise encouraged.   Disposition: Follow-up with Dr. Dina Rich in 6 months or sooner if anything changes.   Medication Adjustments/Labs and Tests Ordered: Current medicines are reviewed at length with the patient today.  Concerns regarding medicines are outlined above.  No orders of the defined types were placed in this encounter.  No orders of the defined types were placed in this encounter.   There are no Patient Instructions on file for this visit.   SignedSharlene Dory, NP  03/06/2023 12:23 PM    [facility]

## 2023-03-06 NOTE — Patient Instructions (Addendum)
Medication Instructions:   Your physician recommends that you continue on your current medications as directed. Please refer to the Current Medication list given to you today.  Labwork:  None  Testing/Procedures:  None  Follow-Up:  Your physician recommends that you schedule a follow-up appointment in: 1 year.  Any Other Special Instructions Will Be Listed Below (If Applicable).  If you need a refill on your cardiac medications before your next appointment, please call your pharmacy. 

## 2023-03-10 ENCOUNTER — Other Ambulatory Visit: Payer: Self-pay | Admitting: Cardiology

## 2023-03-13 NOTE — Telephone Encounter (Signed)
Pt had pre-op 11/11

## 2023-03-13 NOTE — Telephone Encounter (Signed)
LMOVM to call back to schedule TCS with Dr. Tasia Catchings, ASA 3, no plavix x 5 days, no metformin night prior, day of

## 2023-03-15 NOTE — Telephone Encounter (Signed)
LMOVM to call back. Mychart message sent also

## 2023-03-19 ENCOUNTER — Encounter: Payer: Self-pay | Admitting: Orthopedic Surgery

## 2023-03-19 DIAGNOSIS — T84038D Mechanical loosening of other internal prosthetic joint, subsequent encounter: Secondary | ICD-10-CM

## 2023-03-19 DIAGNOSIS — Z96652 Presence of left artificial knee joint: Secondary | ICD-10-CM

## 2023-03-19 DIAGNOSIS — G8929 Other chronic pain: Secondary | ICD-10-CM

## 2023-03-20 MED ORDER — OXYCODONE HCL 5 MG PO TABS
5.0000 mg | ORAL_TABLET | ORAL | 0 refills | Status: DC | PRN
Start: 2023-03-20 — End: 2023-04-07

## 2023-04-07 ENCOUNTER — Encounter: Payer: Self-pay | Admitting: Orthopedic Surgery

## 2023-04-07 ENCOUNTER — Ambulatory Visit: Payer: 59 | Admitting: Orthopedic Surgery

## 2023-04-07 VITALS — BP 135/79 | HR 80 | Ht 65.0 in | Wt 174.0 lb

## 2023-04-07 DIAGNOSIS — Z96652 Presence of left artificial knee joint: Secondary | ICD-10-CM

## 2023-04-07 DIAGNOSIS — M25562 Pain in left knee: Secondary | ICD-10-CM | POA: Diagnosis not present

## 2023-04-07 DIAGNOSIS — Z96659 Presence of unspecified artificial knee joint: Secondary | ICD-10-CM

## 2023-04-07 DIAGNOSIS — G8929 Other chronic pain: Secondary | ICD-10-CM | POA: Diagnosis not present

## 2023-04-07 DIAGNOSIS — T84038D Mechanical loosening of other internal prosthetic joint, subsequent encounter: Secondary | ICD-10-CM

## 2023-04-07 MED ORDER — OXYCODONE HCL 5 MG PO TABS
5.0000 mg | ORAL_TABLET | ORAL | 0 refills | Status: DC | PRN
Start: 2023-04-09 — End: 2023-05-08

## 2023-04-07 NOTE — Progress Notes (Signed)
Follow-up visit  Encounter Diagnoses  Name Primary?   Chronic pain of left knee Yes   S/P total knee replacement, left    Chronic knee pain after total replacement of left knee joint     Chief Complaint  Patient presents with   Knee Pain    Left    [name] is here for evaluation for chronic pain management  She is doing well with the current oxycodone  Her function is very good with the medication and poor without it  No side effects reported  Patient is taking medication as prescribed last urine drug screen was appropriate  Meds ordered this encounter  Medications   oxyCODONE (OXY IR/ROXICODONE) 5 MG immediate release tablet    Sig: Take 1 tablet (5 mg total) by mouth every 4 (four) hours as needed for severe pain (pain score 7-10).    Dispense:  120 tablet    Refill:  0

## 2023-04-09 ENCOUNTER — Encounter: Payer: Self-pay | Admitting: Orthopedic Surgery

## 2023-04-09 DIAGNOSIS — Z96659 Presence of unspecified artificial knee joint: Secondary | ICD-10-CM

## 2023-04-09 DIAGNOSIS — Z96652 Presence of left artificial knee joint: Secondary | ICD-10-CM

## 2023-04-09 DIAGNOSIS — G8929 Other chronic pain: Secondary | ICD-10-CM

## 2023-04-10 ENCOUNTER — Encounter: Payer: Self-pay | Admitting: Orthopedic Surgery

## 2023-04-30 ENCOUNTER — Other Ambulatory Visit: Payer: Self-pay | Admitting: Cardiology

## 2023-05-07 ENCOUNTER — Encounter: Payer: Self-pay | Admitting: Orthopedic Surgery

## 2023-05-07 DIAGNOSIS — Z96652 Presence of left artificial knee joint: Secondary | ICD-10-CM

## 2023-05-07 DIAGNOSIS — G8929 Other chronic pain: Secondary | ICD-10-CM

## 2023-05-07 DIAGNOSIS — T84038D Mechanical loosening of other internal prosthetic joint, subsequent encounter: Secondary | ICD-10-CM

## 2023-05-08 MED ORDER — OXYCODONE HCL 5 MG PO TABS
5.0000 mg | ORAL_TABLET | ORAL | 0 refills | Status: DC | PRN
Start: 2023-05-08 — End: 2023-06-05

## 2023-05-22 ENCOUNTER — Telehealth (INDEPENDENT_AMBULATORY_CARE_PROVIDER_SITE_OTHER): Payer: Self-pay | Admitting: Gastroenterology

## 2023-05-22 MED ORDER — PEG 3350-KCL-NA BICARB-NACL 420 G PO SOLR: 4000.0000 mL | Freq: Once | ORAL | 0 refills | Status: DC

## 2023-05-22 NOTE — Telephone Encounter (Signed)
Pt left voicemail in regards to being seen in November and needing TCS scheduled. Returned call to patient and scheduled her to 06/15/23. Instructions will be sent via mychart. Pt is aware pre op appt will show up on my chart. No auth required per [employer].

## 2023-05-30 ENCOUNTER — Other Ambulatory Visit: Payer: Self-pay | Admitting: Cardiology

## 2023-06-04 ENCOUNTER — Encounter: Payer: Self-pay | Admitting: Orthopedic Surgery

## 2023-06-04 DIAGNOSIS — Z96659 Presence of unspecified artificial knee joint: Secondary | ICD-10-CM

## 2023-06-04 DIAGNOSIS — G8929 Other chronic pain: Secondary | ICD-10-CM

## 2023-06-04 DIAGNOSIS — Z96652 Presence of left artificial knee joint: Secondary | ICD-10-CM

## 2023-06-05 MED ORDER — OXYCODONE HCL 5 MG PO TABS
5.0000 mg | ORAL_TABLET | ORAL | 0 refills | Status: DC | PRN
Start: 2023-06-05 — End: 2023-07-05

## 2023-06-09 NOTE — Patient Instructions (Addendum)
[name]  06/09/2023     @PREFPERIOPPHARMACY @   Your procedure is scheduled on  06/15/2023.   Report to [facility] at  0745  A.M.   Call this number if you have problems the morning of surgery:  [phone]  If you experience any cold or flu symptoms such as cough, fever, chills, shortness of breath, etc. between now and your scheduled surgery, please notify us at the above number.   Remember:        Your last dose of plavix should be on 06/09/2023.   Follow the diet and prep instructions given to you by the office.   You may drink clear liquids until 0545 am on 06/14/2022.    Clear liquids allowed are:                    Water, Juice (No red color; non-citric and without pulp; diabetics please choose diet or no sugar options), Carbonated beverages (diabetics please choose diet or no sugar options), Clear Tea (No creamer, milk, or cream, including half & half and powdered creamer), Black Coffee Only (No creamer, milk or cream, including half & half and powdered creamer), and Clear Sports drink (No red color; diabetics please choose diet or no sugar options)    Take these medicines the morning of surgery with A SIP OF WATER                        carvedilol, oxycodone(if needed).    Do not wear jewelry, make-up or nail polish, including gel polish,  artificial nails, or any other type of covering on natural nails (fingers and  toes).  Do not wear lotions, powders, or perfumes, or deodorant.  Do not shave 48 hours prior to surgery.  Men may shave face and neck.  Do not bring valuables to the hospital.  [facility] is not responsible for any belongings or valuables.  Contacts, dentures or bridgework may not be worn into surgery.  Leave your suitcase in the car.  After surgery it may be brought to your room.  For patients admitted to the hospital, discharge time will be determined by your treatment team.  Patients discharged the day of surgery will not be allowed to  drive home and must have someone with them for 24 hours.    Special instructions:   DO NOT smoke tobacco or vape for 24 hours before your procedure.  Please read over the following fact sheets that you were given. Anesthesia Post-op Instructions and Care and Recovery After Surgery       Colonoscopy, Adult, Care After The following information offers guidance on how to care for yourself after your procedure. Your health care provider may also give you more specific instructions. If you have problems or questions, contact your health care provider. What can I expect after the procedure? After the procedure, it is common to have: A small amount of blood in your stool for 24 hours after the procedure. Some gas. Mild cramping or bloating of your abdomen. Follow these instructions at home: Eating and drinking  Drink enough fluid to keep your urine pale yellow. Follow instructions from your health care provider about eating or drinking restrictions. Resume your normal diet as told by your health care provider. Avoid heavy or fried foods that are hard to digest. Activity Rest as told by your health care provider. Avoid sitting for a long time without moving.  Get up to take short walks every 1-2 hours. This is important to improve blood flow and breathing. Ask for help if you feel weak or unsteady. Return to your normal activities as told by your health care provider. Ask your health care provider what activities are safe for you. Managing cramping and bloating  Try walking around when you have cramps or feel bloated. If directed, apply heat to your abdomen as told by your health care provider. Use the heat source that your health care provider recommends, such as a moist heat pack or a heating pad. Place a towel between your skin and the heat source. Leave the heat on for 20-30 minutes. Remove the heat if your skin turns bright red. This is especially important if you are unable to feel  pain, heat, or cold. You have a greater risk of getting burned. General instructions If you were given a sedative during the procedure, it can affect you for several hours. Do not drive or operate machinery until your health care provider says that it is safe. For the first 24 hours after the procedure: Do not sign important documents. Do not drink alcohol. Do your regular daily activities at a slower pace than normal. Eat soft foods that are easy to digest. Take over-the-counter and prescription medicines only as told by your health care provider. Keep all follow-up visits. This is important. Contact a health care provider if: You have blood in your stool 2-3 days after the procedure. Get help right away if: You have more than a small spotting of blood in your stool. You have large blood clots in your stool. You have swelling of your abdomen. You have nausea or vomiting. You have a fever. You have increasing pain in your abdomen that is not relieved with medicine. These symptoms may be an emergency. Get help right away. Call 911. Do not wait to see if the symptoms will go away. Do not drive yourself to the hospital. Summary After the procedure, it is common to have a small amount of blood in your stool. You may also have mild cramping and bloating of your abdomen. If you were given a sedative during the procedure, it can affect you for several hours. Do not drive or operate machinery until your health care provider says that it is safe. Get help right away if you have a lot of blood in your stool, nausea or vomiting, a fever, or increased pain in your abdomen. This information is not intended to replace advice given to you by your health care provider. Make sure you discuss any questions you have with your health care provider. Document Revised: 05/24/2022 Document Reviewed: 12/02/2020 Elsevier Patient Education  2024 [employer].Monitored Anesthesia Care, Care After The following  information offers guidance on how to care for yourself after your procedure. Your health care provider may also give you more specific instructions. If you have problems or questions, contact your health care provider. What can I expect after the procedure? After the procedure, it is common to have: Tiredness. Little or no memory about what happened during or after the procedure. Impaired judgment when it comes to making decisions. Nausea or vomiting. Some trouble with balance. Follow these instructions at home: For the time period you were told by your health care provider:  Rest. Do not participate in activities where you could fall or become injured. Do not drive or use machinery. Do not drink alcohol. Do not take sleeping pills or medicines that cause drowsiness.  Do not make important decisions or sign legal documents. Do not take care of children on your own. Medicines Take over-the-counter and prescription medicines only as told by your health care provider. If you were prescribed antibiotics, take them as told by your health care provider. Do not stop using the antibiotic even if you start to feel better. Eating and drinking Follow instructions from your health care provider about what you may eat and drink. Drink enough fluid to keep your urine pale yellow. If you vomit: Drink clear fluids slowly and in small amounts as you are able. Clear fluids include water, ice chips, low-calorie sports drinks, and fruit juice that has water added to it (diluted fruit juice). Eat light and bland foods in small amounts as you are able. These foods include bananas, applesauce, rice, lean meats, toast, and crackers. General instructions  Have a responsible adult stay with you for the time you are told. It is important to have someone help care for you until you are awake and alert. If you have sleep apnea, surgery and some medicines can increase your risk for breathing problems. Follow  instructions from your health care provider about wearing your sleep device: When you are sleeping. This includes during daytime naps. While taking prescription pain medicines, sleeping medicines, or medicines that make you drowsy. Do not use any products that contain nicotine or tobacco. These products include cigarettes, chewing tobacco, and vaping devices, such as e-cigarettes. If you need help quitting, ask your health care provider. Contact a health care provider if: You feel nauseous or vomit every time you eat or drink. You feel light-headed. You are still sleepy or having trouble with balance after 24 hours. You get a rash. You have a fever. You have redness or swelling around the IV site. Get help right away if: You have trouble breathing. You have new confusion after you get home. These symptoms may be an emergency. Get help right away. Call 911. Do not wait to see if the symptoms will go away. Do not drive yourself to the hospital. This information is not intended to replace advice given to you by your health care provider. Make sure you discuss any questions you have with your health care provider. Document Revised: 09/06/2021 Document Reviewed: 09/06/2021 Elsevier Patient Education  2024 [employer].

## 2023-06-12 ENCOUNTER — Encounter (HOSPITAL_COMMUNITY)
Admission: RE | Admit: 2023-06-12 | Discharge: 2023-06-12 | Disposition: A | Discharge status: Home / Self Care | Payer: 59 | Source: Ambulatory Visit | Attending: Gastroenterology | Admitting: Gastroenterology

## 2023-06-12 ENCOUNTER — Encounter (HOSPITAL_COMMUNITY): Payer: Self-pay

## 2023-06-12 ENCOUNTER — Other Ambulatory Visit (INDEPENDENT_AMBULATORY_CARE_PROVIDER_SITE_OTHER): Payer: Self-pay

## 2023-06-12 VITALS — BP 106/64 | HR 74 | Temp 98.7°F | Resp 18 | Ht 65.0 in | Wt 175.0 lb

## 2023-06-12 DIAGNOSIS — Z01812 Encounter for preprocedural laboratory examination: Secondary | ICD-10-CM | POA: Diagnosis not present

## 2023-06-12 DIAGNOSIS — E1169 Type 2 diabetes mellitus with other specified complication: Secondary | ICD-10-CM | POA: Diagnosis not present

## 2023-06-12 DIAGNOSIS — E669 Obesity, unspecified: Secondary | ICD-10-CM | POA: Diagnosis not present

## 2023-06-12 LAB — BASIC METABOLIC PANEL
Anion gap: 10 (ref 5–15)
BUN: 25 mg/dL — ABNORMAL HIGH (ref 6–20)
CO2: 21 mmol/L — ABNORMAL LOW (ref 22–32)
Calcium: 9.2 mg/dL (ref 8.9–10.3)
Chloride: 106 mmol/L (ref 98–111)
Creatinine, Ser: 0.48 mg/dL (ref 0.44–1.00)
GFR, Estimated: >60 mL/min (ref >60)
Glucose, Bld: 82 mg/dL (ref 70–99)
Potassium: 4 mmol/L (ref 3.5–5.1)
Sodium: 137 mmol/L (ref 135–145)

## 2023-06-12 MED ORDER — PEG 3350-KCL-NA BICARB-NACL 420 G PO SOLR: 4000.0000 mL | Freq: Once | ORAL | 0 refills | Status: AC

## 2023-06-13 ENCOUNTER — Encounter (HOSPITAL_COMMUNITY): Payer: 59

## 2023-06-13 ENCOUNTER — Telehealth (INDEPENDENT_AMBULATORY_CARE_PROVIDER_SITE_OTHER): Payer: Self-pay | Admitting: Gastroenterology

## 2023-06-13 NOTE — Telephone Encounter (Signed)
 Left message for pt to see if she can move TCS up to 7:30 am on 06/15/23. Will send my chart message.

## 2023-06-15 ENCOUNTER — Ambulatory Visit (HOSPITAL_COMMUNITY)
Admission: RE | Admit: 2023-06-15 | Discharge: 2023-06-15 | Disposition: A | Discharge status: Home / Self Care | Payer: 59 | Attending: Gastroenterology | Admitting: Gastroenterology

## 2023-06-15 ENCOUNTER — Ambulatory Visit (HOSPITAL_COMMUNITY): Payer: 59 | Admitting: Anesthesiology

## 2023-06-15 ENCOUNTER — Encounter (HOSPITAL_COMMUNITY): Payer: Self-pay | Admitting: Gastroenterology

## 2023-06-15 ENCOUNTER — Encounter (HOSPITAL_COMMUNITY)
Admission: RE | Disposition: A | Discharge status: Home / Self Care | Payer: Self-pay | Source: Home / Self Care | Attending: Gastroenterology

## 2023-06-15 ENCOUNTER — Ambulatory Visit (HOSPITAL_BASED_OUTPATIENT_CLINIC_OR_DEPARTMENT_OTHER): Payer: 59 | Admitting: Anesthesiology

## 2023-06-15 DIAGNOSIS — F17210 Nicotine dependence, cigarettes, uncomplicated: Secondary | ICD-10-CM | POA: Diagnosis not present

## 2023-06-15 DIAGNOSIS — Z1211 Encounter for screening for malignant neoplasm of colon: Secondary | ICD-10-CM

## 2023-06-15 DIAGNOSIS — K648 Other hemorrhoids: Secondary | ICD-10-CM

## 2023-06-15 DIAGNOSIS — I10 Essential (primary) hypertension: Secondary | ICD-10-CM | POA: Diagnosis not present

## 2023-06-15 DIAGNOSIS — K635 Polyp of colon: Secondary | ICD-10-CM | POA: Diagnosis not present

## 2023-06-15 DIAGNOSIS — Z7984 Long term (current) use of oral hypoglycemic drugs: Secondary | ICD-10-CM | POA: Insufficient documentation

## 2023-06-15 DIAGNOSIS — K5730 Diverticulosis of large intestine without perforation or abscess without bleeding: Secondary | ICD-10-CM

## 2023-06-15 DIAGNOSIS — E119 Type 2 diabetes mellitus without complications: Secondary | ICD-10-CM | POA: Diagnosis not present

## 2023-06-15 DIAGNOSIS — Z800 Family history of malignant neoplasm of digestive organs: Secondary | ICD-10-CM | POA: Diagnosis not present

## 2023-06-15 DIAGNOSIS — I2510 Atherosclerotic heart disease of native coronary artery without angina pectoris: Secondary | ICD-10-CM

## 2023-06-15 DIAGNOSIS — I252 Old myocardial infarction: Secondary | ICD-10-CM | POA: Insufficient documentation

## 2023-06-15 DIAGNOSIS — D125 Benign neoplasm of sigmoid colon: Secondary | ICD-10-CM

## 2023-06-15 DIAGNOSIS — Z7985 Long-term (current) use of injectable non-insulin antidiabetic drugs: Secondary | ICD-10-CM | POA: Insufficient documentation

## 2023-06-15 DIAGNOSIS — Z980 Intestinal bypass and anastomosis status: Secondary | ICD-10-CM | POA: Insufficient documentation

## 2023-06-15 DIAGNOSIS — Z860100 Personal history of colon polyps, unspecified: Secondary | ICD-10-CM | POA: Diagnosis not present

## 2023-06-15 DIAGNOSIS — K644 Residual hemorrhoidal skin tags: Secondary | ICD-10-CM

## 2023-06-15 HISTORY — PX: COLONOSCOPY WITH PROPOFOL: SHX5780

## 2023-06-15 HISTORY — PX: POLYPECTOMY: SHX5525

## 2023-06-15 LAB — GLUCOSE, CAPILLARY: Glucose-Capillary: 91 mg/dL (ref 70–99)

## 2023-06-15 LAB — HM COLONOSCOPY

## 2023-06-15 SURGERY — COLONOSCOPY WITH PROPOFOL
Anesthesia: General

## 2023-06-15 MED ORDER — LACTATED RINGERS IV SOLN
INTRAVENOUS | Status: DC | PRN
Start: 2023-06-15 — End: 2023-06-15

## 2023-06-15 MED ORDER — PROPOFOL 500 MG/50ML IV EMUL
INTRAVENOUS | Status: DC | PRN
  Administered 2023-06-15: 150 ug/kg/min via INTRAVENOUS

## 2023-06-15 MED ORDER — LIDOCAINE HCL (CARDIAC) PF 100 MG/5ML IV SOSY
PREFILLED_SYRINGE | INTRAVENOUS | Status: DC | PRN
  Administered 2023-06-15: 50 mg via INTRATRACHEAL

## 2023-06-15 MED ORDER — PROPOFOL 10 MG/ML IV BOLUS
INTRAVENOUS | Status: DC | PRN
  Administered 2023-06-15: 20 mg via INTRAVENOUS
  Administered 2023-06-15: 90 mg via INTRAVENOUS

## 2023-06-15 NOTE — H&P (Addendum)
 Primary Care Physician:  [name], MD Primary Gastroenterologist:  Dr. Tasia Catchings  Pre-Procedure History & Physical: HPI:  [name] is a 53 y.o. female is here for a colonoscopy for colon cancer screening purposes.  Family history of colorectal cancer in brother age 50.  No melena or hematochezia.  No abdominal pain or unintentional weight loss.  No change in bowel habits.  Overall feels well from a GI standpoint.  Patient had partial colectomy due to diverticulitis   Past Medical History:  Diagnosis Date   Arthritis    Carpal tunnel syndrome, bilateral    Coronary artery disease    Diabetes mellitus without complication (HCC)    no meds; diet controlled   Diverticulitis    Hyperlipidemia    Phreesia 08/10/2020   Hypertension    Hypertriglyceridemia    Myocardial infarction (HCC)    Phreesia 08/10/2020   PONV (postoperative nausea and vomiting)    Sciatica     Past Surgical History:  Procedure Laterality Date   ablasion of uterus     CARDIAC CATHETERIZATION     CARPAL TUNNEL RELEASE  11/10/2010   Procedure: CARPAL TUNNEL RELEASE;  Surgeon: Fuller Canada, MD;  Location: [facility];  Service: Orthopedics;  Laterality: Right;   CHOLECYSTECTOMY     [facility]   COLON RESECTION N/A 04/08/2015   Procedure: LAPAROSCOPIC HAND ASSISTED PARTIAL COLECTOMY  CONVERTED TO OPEN AT 4696;  Surgeon: Franky Macho, MD;  Location: [facility];  Service: General;  Laterality: N/A;   COLONOSCOPY N/A 03/28/2014   Procedure: COLONOSCOPY;  Surgeon: Malissa Hippo, MD;  Location: [facility];  Service: Endoscopy;  Laterality: N/A;  730   CORONARY STENT INTERVENTION  04/07/2020   CORONARY STENT INTERVENTION N/A 04/07/2020   Procedure: CORONARY STENT INTERVENTION;  Surgeon: Corky Crafts, MD;  Location: [facility];  Service: Cardiovascular;  Laterality: N/A;   JOINT REPLACEMENT N/A    Phreesia 08/10/2020   KNEE ARTHROSCOPY WITH DRILLING/MICROFRACTURE Left 10/20/2016   Procedure: LEFT  KNEE ARTHROSCOPY WITH MICROFRACTURE;  Surgeon: Vickki Hearing, MD;  Location: [facility];  Service: Orthopedics;  Laterality: Left;   KNEE ARTHROSCOPY WITH DRILLING/MICROFRACTURE Left 04/27/2017   Procedure: KNEE ARTHROSCOPY WITH DRILLING/MICROFRACTURE;  Surgeon: Vickki Hearing, MD;  Location: [facility];  Service: Orthopedics;  Laterality: Left;   KNEE ARTHROSCOPY WITH OSTEOCHONDRAL DEFECT REPAIR Left 04/27/2017   Procedure: KNEE ARTHROSCOPY WITH OSTEOCHONDRAL DEFECT REPAIR Autograft;  Surgeon: Vickki Hearing, MD;  Location: [facility];  Service: Orthopedics;  Laterality: Left;   KNEE SURGERY     LEFT HEART CATH AND CORONARY ANGIOGRAPHY N/A 04/07/2020   Procedure: LEFT HEART CATH AND CORONARY ANGIOGRAPHY;  Surgeon: Corky Crafts, MD;  [facility]AS[facility] CV LAB;  Service: Cardiovascular;  Laterality: N/A;   PARTIAL COLECTOMY N/A 04/08/2015   Procedure: PARTIAL COLECTOMY;  Surgeon: Franky Macho, MD;  Location: [facility];  Service: General;  Laterality: N/A;   right knee arthroscopy     SMALL INTESTINE SURGERY N/A    Phreesia 08/10/2020   TOTAL KNEE ARTHROPLASTY Left 10/17/2017   Procedure: TOTAL KNEE ARTHROPLASTY;  Surgeon: Vickki Hearing, MD;  Location: [facility];  Service: Orthopedics;  Laterality: Left;  [place]    TOTAL KNEE REVISION Left 10/20/2020   Procedure: REVISION TIBIAL COMPONENT LEFT TOTAL KNEE;  Surgeon: Vickki Hearing, MD;  Location: [facility];  Service: Orthopedics;  Laterality: Left;   TUBAL LIGATION      Prior to Admission medications  Medication Sig Start Date End Date Taking? Authorizing Provider  atorvastatin (LIPITOR) 40 MG tablet Take 1 tablet by mouth once daily 05/30/23  Yes Branch, Dorothe Pea, MD  carvedilol (COREG) 12.5 MG tablet Take 1 tablet by mouth twice daily 03/10/23  Yes Branch, Dorothe Pea, MD  glucose blood (ONETOUCH VERIO) test strip Use as instructed to monitor blood glucose once daily. 05/07/20  Yes Reardon, Freddi Starr, NP  Lancets (ONETOUCH  DELICA PLUS LANCET33G) MISC by Does not apply route.   Yes [provider]  losartan (COZAAR) 50 MG tablet Take 1 tablet by mouth once daily 07/20/22  Yes Branch, Dorothe Pea, MD  metFORMIN (GLUCOPHAGE-XR) 500 MG 24 hr tablet Take 2 tablets (1,000 mg total) by mouth 2 (two) times daily with a meal. 11/08/22  Yes [name], MD  montelukast (SINGULAIR) 10 MG tablet Take 1 tablet (10 mg total) by mouth at bedtime. 02/08/23  Yes Del Newman Nip, Tenna Child, FNP  oxyCODONE (OXY IR/ROXICODONE) 5 MG immediate release tablet Take 1 tablet (5 mg total) by mouth every 4 (four) hours as needed for severe pain (pain score 7-10). 06/05/23  Yes Vickki Hearing, MD  promethazine (PHENERGAN) 12.5 MG tablet TAKE 1 TABLET BY MOUTH EVERY 4 HOURS AS NEEDED FOR NAUSEA FOR VOMITING 11/15/22  Yes Vickki Hearing, MD  clopidogrel (PLAVIX) 75 MG tablet Take 1 tablet by mouth once daily 05/01/23   Antoine Poche, MD  nitroGLYCERIN (NITROSTAT) 0.4 MG SL tablet PLACE 1 TABLET (0.4 MG TOTAL) UNDER THE TONGUE EVERY 5 (FIVE) MINUTES AS NEEDED FOR CHEST PAIN. 03/06/23 03/05/24  Sharlene Dory, NP  tirzepatide [facility]) 7.5 MG/0.5ML Pen Inject 7.5 mg into the skin once a week.    [provider]  pantoprazole (PROTONIX) 40 MG tablet Take 1 tablet (40 mg total) by mouth daily. Further refills by PCP Patient not taking: Reported on 05/18/2020 04/08/20 05/18/20  Manson Passey, PA    Allergies as of 05/22/2023 - Review Complete 04/07/2023  Allergen Reaction Noted   Other  08/10/2020   Flagyl [metronidazole] Nausea Only 01/08/2015   Hydrocodone Nausea And Vomiting and Other (See Comments)     Family History  Problem Relation Age of Onset   Diabetes type I Mother    Hyperlipidemia Mother    Diabetes type II Father    Hypertension Father    Hyperlipidemia Father    Diabetes type II Brother    Diabetes type II Brother    Rectal cancer Brother    Cancer Other    Diabetes Other    Arthritis  Other    Asthma Other    Anesthesia problems Neg Hx     Social History   Socioeconomic History   Marital status: Divorced    Spouse name: Not on file   Number of children: Not on file   Years of education: college   Highest education level: Not on file  Occupational History   Occupation: nurse    Employer: [employer]  Tobacco Use   Smoking status: Every Day    Current packs/day: 0.00    Average packs/day: 1 pack/day for 26.0 years (26.0 ttl pk-yrs)    Types: Cigarettes    Start date: 04/05/1994    Last attempt to quit: 04/05/2020    Years since quitting: 3.1   Smokeless tobacco: Never  Vaping Use   Vaping status: Never Used  Substance and Sexual Activity   Alcohol use: No    Alcohol/week: 0.0 standard drinks of  alcohol   Drug use: No   Sexual activity: Yes    Birth control/protection: Surgical  Other Topics Concern   Not on file  Social History Narrative   Not on file   Social Drivers of Health   Financial Resource Strain: Not on file  Food Insecurity: Not on file  Transportation Needs: Not on file  Physical Activity: Not on file  Stress: Not on file  Social Connections: Not on file  Intimate Partner Violence: Not on file    Review of Systems: See HPI, otherwise negative ROS  Physical Exam: Vital signs in last 24 hours: Pulse Rate:  [68] 68 (02/20 0627) Resp:  [11] 11 (02/20 0627) BP: (119)/(70) 119/70 (02/20 0627) SpO2:  [99 %] 99 % (02/20 0627) Weight:  [79.3 kg] 79.3 kg (02/20 0627)   General:   Alert,  Well-developed, well-nourished, pleasant and cooperative in NAD Head:  Normocephalic and atraumatic. Eyes:  Sclera clear, no icterus.   Conjunctiva pink. Ears:  Normal auditory acuity. Nose:  No deformity, discharge,  or lesions. Msk:  Symmetrical without gross deformities. Normal posture. Extremities:  Without clubbing or edema. Neurologic:  Alert and  oriented x4;  grossly normal neurologically. Skin:  Intact without significant lesions or  rashes. Psych:  Alert and cooperative. Normal mood and affect.  Impression/Plan: [name] is here for a colonoscopy to be performed for colon cancer screening purposes.  The risks of the procedure including infection, bleed, or perforation as well as benefits, limitations, alternatives and imponderables have been reviewed with the patient. Questions have been answered. All parties agreeable.

## 2023-06-15 NOTE — Transfer of Care (Signed)
 Immediate Anesthesia Transfer of Care Note  Patient: [name]  Procedure(s) Performed: COLONOSCOPY WITH PROPOFOL POLYPECTOMY  Patient Location: Short Stay  Anesthesia Type:General  Level of Consciousness: awake and alert   Airway & Oxygen Therapy: Patient Spontanous Breathing  Post-op Assessment: Report given to RN  Post vital signs: Reviewed  Last Vitals:  Vitals Value Taken Time  BP 81/75 06/15/23 0808  Temp 36.5 C 06/15/23 0808  Pulse 71 06/15/23 0808  Resp 16 06/15/23 0808  SpO2 99 % 06/15/23 0808    Last Pain:  Vitals:   06/15/23 0808  TempSrc: Oral  PainSc:          Complications: No notable events documented.

## 2023-06-15 NOTE — Discharge Instructions (Signed)

## 2023-06-15 NOTE — Op Note (Signed)
 [facility] Patient Name: [name] Procedure Date: 06/15/2023 7:03 AM MRN: [id number] Date of Birth: 08/23/1970 Attending MD: Sanjuan Dame , MD, [id number] CSN: [id number] Age: 53 Admit Type: Outpatient Procedure:                Colonoscopy Indications:              Screening in patient at increased risk: Colorectal                            cancer in brother before age 48 Providers:                Sanjuan Dame, MD, Francoise Ceo RN, RN, Dyann Ruddle Referring MD:              Medicines:                Monitored Anesthesia Care Complications:            No immediate complications. Estimated Blood Loss:     Estimated blood loss: none. Procedure:                Pre-Anesthesia Assessment:                           - Prior to the procedure, a History and Physical                            was performed, and patient medications and                            allergies were reviewed. The patient's tolerance of                            previous anesthesia was also reviewed. The risks                            and benefits of the procedure and the sedation                            options and risks were discussed with the patient.                            All questions were answered, and informed consent                            was obtained. Prior Anticoagulants: The patient has                            taken Plavix (clopidogrel), last dose was 5 days                            prior to procedure. ASA Grade Assessment: III - A                            patient with severe systemic disease. After  reviewing the risks and benefits, the patient was                            deemed in satisfactory condition to undergo the                            procedure.                           After obtaining informed consent, the colonoscope                            was passed under direct vision. Throughout the                            procedure,  the patient's blood pressure, pulse, and                            oxygen saturations were monitored continuously. The                            [id number]) scope was introduced through the                            anus and advanced to the the cecum, identified by                            appendiceal orifice and ileocecal valve. The                            colonoscopy was performed without difficulty. The                            patient tolerated the procedure well. The quality                            of the bowel preparation was evaluated using the                            BBPS [facility] Bowel Preparation Scale) with scores                            of: Right Colon = 3, Transverse Colon = 3 and Left                            Colon = 3 (entire mucosa seen well with no residual                            staining, small fragments of stool or opaque                            liquid). The total BBPS score equals 9. The  ileocecal valve, appendiceal orifice, and rectum                            were photographed. Scope In: 7:44:21 AM Scope Out: 8:03:00 AM Scope Withdrawal Time: 0 hours 15 minutes 15 seconds  Total Procedure Duration: 0 hours 18 minutes 39 seconds  Findings:      The perianal and digital rectal examinations were normal.      There was evidence of a prior end-to-side colo-colonic anastomosis in       the sigmoid colon. This was patent and was characterized by healthy       appearing mucosa. The anastomosis was traversed.      Two sessile polyps were found in the descending colon. The polyps were 3       to 5 mm in size. These polyps were removed with a cold snare. Resection       and retrieval were complete.      A few diverticula were found in the entire colon.      Non-bleeding external and internal hemorrhoids were found during       retroflexion. The hemorrhoids were small. Impression:               - Patent end-to-side  colo-colonic anastomosis,                            characterized by healthy appearing mucosa.                           - Two 3 to 5 mm polyps in the descending colon,                            removed with a cold snare. Resected and retrieved.                           - Diverticulosis in the entire examined colon.                           - Non-bleeding external and internal hemorrhoids. Moderate Sedation:      Per Anesthesia Care Recommendation:           - Patient has a contact number available for                            emergencies. The signs and symptoms of potential                            delayed complications were discussed with the                            patient. Return to normal activities tomorrow.                            Written discharge instructions were provided to the                            patient.                           -  Resume previous diet.                           - Continue present medications.                           - Await pathology results.                           - Repeat colonoscopy in 5 years for surveillance.                           - Return to primary care physician as previously                            scheduled. Procedure Code(s):        --- Professional ---                           [phone], Colonoscopy, flexible; with removal of                            tumor(s), polyp(s), or other lesion(s) by snare                            technique Diagnosis Code(s):        --- Professional ---                           Z80.0, Family history of malignant neoplasm of                            digestive organs                           Z98.0, Intestinal bypass and anastomosis status                           D12.4, Benign neoplasm of descending colon                           K64.8, Other hemorrhoids                           K57.30, Diverticulosis of large intestine without                            perforation or abscess  without bleeding CPT copyright 2022 American Medical Association. All rights reserved. The codes documented in this report are preliminary and upon coder review may  be revised to meet current compliance requirements. Sanjuan Dame, MD Sanjuan Dame, MD 06/15/2023 8:11:47 AM This report has been signed electronically. Number of Addenda: 0

## 2023-06-15 NOTE — Anesthesia Postprocedure Evaluation (Signed)
 Anesthesia Post Note  Patient: [name]  Procedure(s) Performed: COLONOSCOPY WITH PROPOFOL POLYPECTOMY  Patient location during evaluation: Short Stay Anesthesia Type: General Level of consciousness: awake and alert Pain management: pain level controlled Vital Signs Assessment: post-procedure vital signs reviewed and stable Respiratory status: spontaneous breathing Cardiovascular status: blood pressure returned to baseline and stable Postop Assessment: no apparent nausea or vomiting Anesthetic complications: no   No notable events documented.   Last Vitals:  Vitals:   06/15/23 0808 06/15/23 0811  BP: (!) 81/75 101/70  Pulse: 71   Resp: 16   Temp: 36.5 C   SpO2: 99%     Last Pain:  Vitals:   06/15/23 0808  TempSrc: Oral  PainSc:                  Glynn Octave

## 2023-06-15 NOTE — Anesthesia Preprocedure Evaluation (Signed)
 Anesthesia Evaluation  Patient identified by MRN, date of birth, ID band Patient awake    Reviewed: Allergy & Precautions, H&P , NPO status , Patient's Chart, lab work & pertinent test results, reviewed documented beta blocker date and time   History of Anesthesia Complications (+) PONV and history of anesthetic complications  Airway Mallampati: II  TM Distance: >3 FB Neck ROM: full    Dental no notable dental hx.    Pulmonary neg pulmonary ROS, Current Smoker and Patient abstained from smoking.   Pulmonary exam normal breath sounds clear to auscultation       Cardiovascular Exercise Tolerance: Good hypertension, + CAD and + Past MI  negative cardio ROS  Rhythm:regular Rate:Normal     Neuro/Psych  Neuromuscular disease negative neurological ROS  negative psych ROS   GI/Hepatic negative GI ROS, Neg liver ROS,,,  Endo/Other  negative endocrine ROSdiabetes    Renal/GU negative Renal ROS  negative genitourinary   Musculoskeletal   Abdominal   Peds  Hematology negative hematology ROS (+)   Anesthesia Other Findings   Reproductive/Obstetrics negative OB ROS                             Anesthesia Physical Anesthesia Plan  ASA: 3  Anesthesia Plan: General   Post-op Pain Management:    Induction:   PONV Risk Score and Plan: Propofol infusion  Airway Management Planned:   Additional Equipment:   Intra-op Plan:   Post-operative Plan:   Informed Consent: I have reviewed the patients History and Physical, chart, labs and discussed the procedure including the risks, benefits and alternatives for the proposed anesthesia with the patient or authorized representative who has indicated his/her understanding and acceptance.     Dental Advisory Given  Plan Discussed with: CRNA  Anesthesia Plan Comments:        Anesthesia Quick Evaluation

## 2023-06-16 ENCOUNTER — Encounter (HOSPITAL_COMMUNITY): Payer: Self-pay | Admitting: Gastroenterology

## 2023-06-16 LAB — SURGICAL PATHOLOGY

## 2023-06-20 ENCOUNTER — Encounter (INDEPENDENT_AMBULATORY_CARE_PROVIDER_SITE_OTHER): Payer: Self-pay | Admitting: *Deleted

## 2023-06-21 NOTE — Progress Notes (Signed)
 I reviewed the pathology results. Ann, can you send her a letter with the findings as described below please?  Repeat colonoscopy in 5 years  Thanks,  Vista Lawman, MD Gastroenterology and Hepatology [facility]  ---------------------------------------------------------------------------------------------  [facility] [address], [address], [place], [place] [address] Phone:  [phone]   06/21/23 [name], [place]   Dear [name],  I am writing to let you know the results of your recent colonoscopy.  You had a total of 2 polyps removed. The pathology came back as "hyperplastic polyp." These findings are NOT cancer and do not turn into cancer.  Given these findings, it is recommended that your next colonoscopy be performed in 5 years given family history of colon cancer .  Also I value your feedback , so if you get a survey , please take the time to fill it out and thank you for choosing [facility]  Please call us at [phone] if you have persistent problems or have questions about your condition that have not been fully answered at this time.  Sincerely,  Vista Lawman, MD Gastroenterology and Hepatology

## 2023-06-22 ENCOUNTER — Encounter (INDEPENDENT_AMBULATORY_CARE_PROVIDER_SITE_OTHER): Payer: Self-pay | Admitting: *Deleted

## 2023-06-29 ENCOUNTER — Encounter: Payer: Self-pay | Admitting: Orthopedic Surgery

## 2023-07-03 ENCOUNTER — Encounter: Payer: Self-pay | Admitting: Orthopedic Surgery

## 2023-07-05 ENCOUNTER — Telehealth: Payer: Self-pay | Admitting: Orthopedic Surgery

## 2023-07-05 DIAGNOSIS — Z96652 Presence of left artificial knee joint: Secondary | ICD-10-CM

## 2023-07-05 DIAGNOSIS — Z96659 Presence of unspecified artificial knee joint: Secondary | ICD-10-CM

## 2023-07-05 DIAGNOSIS — G8929 Other chronic pain: Secondary | ICD-10-CM

## 2023-07-05 DIAGNOSIS — T84038D Mechanical loosening of other internal prosthetic joint, subsequent encounter: Secondary | ICD-10-CM

## 2023-07-05 MED ORDER — OXYCODONE HCL 5 MG PO TABS: 5.0000 mg | ORAL_TABLET | ORAL | 0 refills | Status: DC | PRN

## 2023-07-05 NOTE — Telephone Encounter (Signed)
 Dr. Mort Sawyers pt - spoke w/the pt, she stated that she has been trying to get a refill since last Thursday.  She is requesting a refill for Oxycodone 5mg , 120 tablets, e very 4 hours PRN for severe pain (pain score 7-10) to be sent to [facility] in [place].

## 2023-07-14 ENCOUNTER — Encounter: Payer: Self-pay | Admitting: Orthopedic Surgery

## 2023-07-14 ENCOUNTER — Ambulatory Visit: Payer: 59 | Admitting: Orthopedic Surgery

## 2023-07-14 VITALS — BP 137/92 | HR 79 | Ht 65.0 in | Wt 174.0 lb

## 2023-07-14 DIAGNOSIS — Z96652 Presence of left artificial knee joint: Secondary | ICD-10-CM

## 2023-07-14 DIAGNOSIS — M25562 Pain in left knee: Secondary | ICD-10-CM

## 2023-07-14 DIAGNOSIS — Z79891 Long term (current) use of opiate analgesic: Secondary | ICD-10-CM

## 2023-07-14 DIAGNOSIS — Z96659 Presence of unspecified artificial knee joint: Secondary | ICD-10-CM

## 2023-07-14 DIAGNOSIS — G8929 Other chronic pain: Secondary | ICD-10-CM | POA: Diagnosis not present

## 2023-07-14 DIAGNOSIS — T84038D Mechanical loosening of other internal prosthetic joint, subsequent encounter: Secondary | ICD-10-CM | POA: Diagnosis not present

## 2023-07-14 NOTE — Progress Notes (Signed)
   BP (!) 137/92   Pulse 79   Ht 5\' 5"  (1.651 m)   Wt 174 lb (78.9 kg)   BMI 28.96 kg/m   Body mass index is 28.96 kg/m.  Chief Complaint  Patient presents with   Knee Pain    LEFT/ about the same     Encounter Diagnoses  Name Primary?   Chronic pain of left knee Yes   S/P total knee replacement, left    Chronically on opiate therapy    Status post revision of total replacement of left knee    Mechanical loosening of prosthetic knee, subsequent encounter     DOI/DOS/ Date: 10/14/20 left knee revision    Current Outpatient Medications:    atorvastatin (LIPITOR) 40 MG tablet, Take 1 tablet by mouth once daily, Disp: 90 tablet, Rfl: 3   carvedilol (COREG) 12.5 MG tablet, Take 1 tablet by mouth twice daily, Disp: 60 tablet, Rfl: 4   clopidogrel (PLAVIX) 75 MG tablet, Take 1 tablet by mouth once daily, Disp: 90 tablet, Rfl: 3   glucose blood (ONETOUCH VERIO) test strip, Use as instructed to monitor blood glucose once daily., Disp: 100 each, Rfl: 12   Lancets (ONETOUCH DELICA PLUS LANCET33G) MISC, by Does not apply route., Disp: , Rfl:    losartan (COZAAR) 50 MG tablet, Take 1 tablet by mouth once daily, Disp: 90 tablet, Rfl: 3   metFORMIN (GLUCOPHAGE-XR) 500 MG 24 hr tablet, Take 2 tablets (1,000 mg total) by mouth 2 (two) times daily with a meal., Disp: 360 tablet, Rfl: 3   montelukast (SINGULAIR) 10 MG tablet, Take 1 tablet (10 mg total) by mouth at bedtime., Disp: 30 tablet, Rfl: 3   nitroGLYCERIN (NITROSTAT) 0.4 MG SL tablet, PLACE 1 TABLET (0.4 MG TOTAL) UNDER THE TONGUE EVERY 5 (FIVE) MINUTES AS NEEDED FOR CHEST PAIN., Disp: 25 tablet, Rfl: 12   oxyCODONE (OXY IR/ROXICODONE) 5 MG immediate release tablet, Take 1 tablet (5 mg total) by mouth every 4 (four) hours as needed for severe pain (pain score 7-10)., Disp: 120 tablet, Rfl: 0   promethazine (PHENERGAN) 12.5 MG tablet, TAKE 1 TABLET BY MOUTH EVERY 4 HOURS AS NEEDED FOR NAUSEA FOR VOMITING, Disp: 42 tablet, Rfl: 0    tirzepatide (MOUNJARO) 7.5 MG/0.5ML Pen, Inject 7.5 mg into the skin once a week., Disp: , Rfl:     Ms. Verga is stable with her chronic condition.  The current 5 mg oxycodone 1 every 4 hours as needed is working well with no side effects  Continue current regimen  Return 3 months

## 2023-07-14 NOTE — Progress Notes (Signed)
   There were no vitals taken for this visit.  There is no height or weight on file to calculate BMI.  Chief Complaint  Patient presents with   Knee Pain    LEFT/ about the same     No diagnosis found.  DOI/DOS/ Date: 10/14/20 left knee revision   Unchanged

## 2023-07-27 ENCOUNTER — Other Ambulatory Visit: Payer: Self-pay | Admitting: Orthopedic Surgery

## 2023-07-27 ENCOUNTER — Encounter: Payer: Self-pay | Admitting: Orthopedic Surgery

## 2023-07-27 DIAGNOSIS — G8929 Other chronic pain: Secondary | ICD-10-CM

## 2023-07-27 DIAGNOSIS — T84038D Mechanical loosening of other internal prosthetic joint, subsequent encounter: Secondary | ICD-10-CM

## 2023-07-27 DIAGNOSIS — Z96652 Presence of left artificial knee joint: Secondary | ICD-10-CM

## 2023-07-27 MED ORDER — OXYCODONE HCL 5 MG PO TABS
5.0000 mg | ORAL_TABLET | ORAL | 0 refills | Status: DC | PRN
Start: 2023-07-27 — End: 2023-08-22

## 2023-07-30 ENCOUNTER — Other Ambulatory Visit: Payer: Self-pay | Admitting: Cardiology

## 2023-08-18 ENCOUNTER — Ambulatory Visit: Payer: 59 | Admitting: Internal Medicine

## 2023-08-22 ENCOUNTER — Encounter: Payer: Self-pay | Admitting: Orthopedic Surgery

## 2023-08-22 DIAGNOSIS — T84038D Mechanical loosening of other internal prosthetic joint, subsequent encounter: Secondary | ICD-10-CM

## 2023-08-22 DIAGNOSIS — G8929 Other chronic pain: Secondary | ICD-10-CM

## 2023-08-22 DIAGNOSIS — Z96652 Presence of left artificial knee joint: Secondary | ICD-10-CM

## 2023-08-24 MED ORDER — OXYCODONE HCL 5 MG PO TABS
5.0000 mg | ORAL_TABLET | ORAL | 0 refills | Status: DC | PRN
Start: 2023-08-24 — End: 2023-09-20

## 2023-09-19 ENCOUNTER — Encounter: Payer: Self-pay | Admitting: Orthopedic Surgery

## 2023-09-19 DIAGNOSIS — T84038D Mechanical loosening of other internal prosthetic joint, subsequent encounter: Secondary | ICD-10-CM

## 2023-09-19 DIAGNOSIS — G8929 Other chronic pain: Secondary | ICD-10-CM

## 2023-09-19 DIAGNOSIS — Z96652 Presence of left artificial knee joint: Secondary | ICD-10-CM

## 2023-09-20 MED ORDER — OXYCODONE HCL 5 MG PO TABS: 5.0000 mg | ORAL_TABLET | ORAL | 0 refills | Status: DC | PRN

## 2023-09-21 ENCOUNTER — Ambulatory Visit: Admitting: Internal Medicine

## 2023-09-27 ENCOUNTER — Ambulatory Visit: Admitting: Internal Medicine

## 2023-10-09 ENCOUNTER — Ambulatory Visit

## 2023-10-09 VITALS — BP 122/76 | HR 80 | Ht 65.0 in | Wt 174.0 lb

## 2023-10-09 DIAGNOSIS — R232 Flushing: Secondary | ICD-10-CM | POA: Diagnosis not present

## 2023-10-09 DIAGNOSIS — E1169 Type 2 diabetes mellitus with other specified complication: Secondary | ICD-10-CM | POA: Diagnosis not present

## 2023-10-09 DIAGNOSIS — Z720 Tobacco use: Secondary | ICD-10-CM | POA: Diagnosis not present

## 2023-10-09 DIAGNOSIS — E669 Obesity, unspecified: Secondary | ICD-10-CM | POA: Diagnosis not present

## 2023-10-09 DIAGNOSIS — G4711 Idiopathic hypersomnia with long sleep time: Secondary | ICD-10-CM

## 2023-10-09 DIAGNOSIS — R0683 Snoring: Secondary | ICD-10-CM

## 2023-10-09 NOTE — Progress Notes (Unsigned)
   Established Patient Office Visit  Subjective   Patient ID: [name], female    DOB: 09-Apr-1971  Age: 53 y.o. MRN: [id number]  Chief Complaint  Patient presents with   Medical Management of Chronic Issues    Routine Follow up    HPI  Patient Active Problem List   Diagnosis Date Noted   Adenomatous polyp of sigmoid colon 06/15/2023   Obesity (BMI 30.0-34.9) 02/17/2023   Cervical cancer screening 09/23/2022   Current tobacco use 09/23/2022   Upper respiratory infection 07/29/2022   Bronchitis 07/21/2022   Strep pharyngitis 07/12/2022   Acute cough 07/12/2022   Sore throat 07/12/2022   Non-recurrent acute suppurative otitis media of right ear without spontaneous rupture of tympanic membrane 07/12/2022   Encounter for general adult medical examination with abnormal findings 06/15/2022   HLD (hyperlipidemia) 06/15/2022   Chronic, continuous use of opioids 06/15/2022   Loud snoring 06/15/2022   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)    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 RCA 2021    Type 2 diabetes mellitus with obesity (HCC)    Hypertension    Non-ST elevation (NSTEMI) myocardial infarction [facility])    S/P left knee arthroscopy 04/27/17 03/28/2017   S/P total knee replacement, 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    Diverticulitis of large intestine without perforation or abscess without bleeding    NAFLD (nonalcoholic fatty liver disease) 64/33/2951   Family hx of colon cancer requiring screening colonoscopy 03/11/2014      ROS    Objective:     BP 122/76   Pulse 80   Ht 5' 5 (1.651 m)   Wt 174 lb (78.9 kg)   SpO2 97%   BMI 28.96 kg/m  BP Readings from Last 3 Encounters:  10/09/23 122/76  07/14/23 (!) 137/92  06/15/23 101/70   Wt Readings from Last 3 Encounters:  10/09/23 174 lb (78.9 kg)  07/14/23 174 lb (78.9 kg)  06/15/23 174 lb 13.2 oz (79.3 kg)     Physical Exam   No results found for  any visits on 10/09/23.  {Labs (Optional):23779}  The ASCVD Risk score (Arnett DK, et al., 2019) failed to calculate for the following reasons:   Risk score cannot be calculated because patient has a medical history suggesting prior/existing ASCVD    Assessment & Plan:   Problem List Items Addressed This Visit   None   No follow-ups on file.    Alison Irvine, FNP

## 2023-10-10 NOTE — Assessment & Plan Note (Signed)
 Currently smokes 1 pack/day of cigarettes and has been smoking for 35 years.  She is precontemplative with regards to cessation, but is agreeable to a referral for lung cancer screening. -The patient was counseled on the dangers of tobacco use, and was advised to quit and reluctant to quit.  Reviewed strategies to maximize success, including removing cigarettes and smoking materials from environment, stress management, substitution of other forms of reinforcement, support of family/friends, and written materials.  I spent 3 minutes discussing need for smoking cessation.

## 2023-10-10 NOTE — Assessment & Plan Note (Signed)
 She is high risk for moderate to severe sleep apnea, even though she has lost >60 lbs in the past year.  She still feels fatigued throughout day, snores loudly, and has apneic spells.  - Referral for sleep study

## 2023-10-10 NOTE — Assessment & Plan Note (Signed)
 Lab Results  Component Value Date   HGBA1C 5.9 (H) 02/17/2023   HGBA1C 7.3 (H) 09/23/2022   HGBA1C 8.3 (H) 06/14/2022   Even though patient has lost over 60 pounds, she still reports ongoing fatigue.  Last A1c was 5.9. Will recheck labs today, to include hormone levels to assess menopausal state. Continue with current medications.  Recommend follow-up in 6 months or sooner if labs indicate.

## 2023-10-13 ENCOUNTER — Ambulatory Visit: Admitting: Orthopedic Surgery

## 2023-10-13 DIAGNOSIS — E1169 Type 2 diabetes mellitus with other specified complication: Secondary | ICD-10-CM | POA: Diagnosis not present

## 2023-10-13 DIAGNOSIS — E669 Obesity, unspecified: Secondary | ICD-10-CM | POA: Diagnosis not present

## 2023-10-13 DIAGNOSIS — R232 Flushing: Secondary | ICD-10-CM | POA: Diagnosis not present

## 2023-10-15 LAB — FSH/LH
FSH: 33.8 m[IU]/mL
LH: 26.7 m[IU]/mL

## 2023-10-15 LAB — MICROALBUMIN / CREATININE URINE RATIO
Creatinine, Urine: 390.3 mg/dL
Microalb/Creat Ratio: 21 mg/g{creat} (ref 0–29)
Microalbumin, Urine: 80.8 ug/mL

## 2023-10-15 LAB — LIPID PANEL
Chol/HDL Ratio: 2.6 ratio (ref 0.0–4.4)
Cholesterol, Total: 75 mg/dL — ABNORMAL LOW (ref 100–199)
HDL: 29 mg/dL — ABNORMAL LOW (ref >39)
LDL Chol Calc (NIH): 30 mg/dL (ref 0–99)
Triglycerides: 76 mg/dL (ref 0–149)
VLDL Cholesterol Cal: 16 mg/dL (ref 5–40)

## 2023-10-15 LAB — HEMOGLOBIN A1C
Est. average glucose Bld gHb Est-mCnc: 108 mg/dL
Hgb A1c MFr Bld: 5.4 % (ref 4.8–5.6)

## 2023-10-15 LAB — ESTRADIOL: Estradiol: 13.6 pg/mL

## 2023-10-16 ENCOUNTER — Ambulatory Visit: Admitting: Orthopedic Surgery

## 2023-10-16 VITALS — BP 111/73 | HR 72

## 2023-10-16 DIAGNOSIS — Z96652 Presence of left artificial knee joint: Secondary | ICD-10-CM | POA: Diagnosis not present

## 2023-10-16 DIAGNOSIS — M25562 Pain in left knee: Secondary | ICD-10-CM

## 2023-10-16 DIAGNOSIS — G8929 Other chronic pain: Secondary | ICD-10-CM

## 2023-10-16 DIAGNOSIS — T84038D Mechanical loosening of other internal prosthetic joint, subsequent encounter: Secondary | ICD-10-CM | POA: Diagnosis not present

## 2023-10-16 DIAGNOSIS — Z96659 Presence of unspecified artificial knee joint: Secondary | ICD-10-CM

## 2023-10-16 MED ORDER — OXYCODONE HCL 5 MG PO TABS: 5.0000 mg | ORAL_TABLET | ORAL | 0 refills | Status: DC | PRN

## 2023-10-16 NOTE — Progress Notes (Signed)
   There were no vitals taken for this visit.  There is no height or weight on file to calculate BMI.  No chief complaint on file.   Encounter Diagnosis  Name Primary?   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/20/20 Yes    DOI/DOS/ Date: follow up  Unchanged- depends on how much work or standing.

## 2023-10-16 NOTE — Progress Notes (Signed)
 Follow-up for chronic opioid therapy  BP 111/73   Pulse 72   There is no height or weight on file to calculate BMI.  Chief Complaint  Patient presents with   Pain    Left knee pain- is unchanged. 3 mos folow up    Encounter Diagnoses  Name Primary?   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/20/20 Yes   Chronic pain of left knee    S/P total knee replacement, left    Mechanical loosening of prosthetic knee, subsequent encounter     DOI/DOS/ Date: follow up  Symptoms continued intermittent pain which is dependent on activity  The patient is on 5 mg of OxyContin  dosed every 4 hours as needed  She is compliant with her dosing and the medication is allowing her to complete functional activity and go to work.  She is not having any side effects  Meds ordered this encounter  Medications   oxyCODONE  (OXY IR/ROXICODONE ) 5 MG immediate release tablet    Sig: Take 1 tablet (5 mg total) by mouth every 4 (four) hours as needed for severe pain (pain score 7-10).    Dispense:  120 tablet    Refill:  0    Return in 3 months

## 2023-10-20 ENCOUNTER — Telehealth: Payer: Self-pay

## 2023-10-20 ENCOUNTER — Ambulatory Visit: Payer: Self-pay

## 2023-10-20 NOTE — Telephone Encounter (Signed)
 Copied from CRM [id number]. Topic: Clinical - Request for Lab/Test Order >> Oct 20, 2023 10:37 AM Turkey B wrote: Reason for CRM: pt called in for her lab results and if she will be given any med.  No notes are in system yet

## 2023-10-23 ENCOUNTER — Other Ambulatory Visit: Payer: Self-pay

## 2023-10-23 DIAGNOSIS — N951 Menopausal and female climacteric states: Secondary | ICD-10-CM

## 2023-10-23 MED ORDER — ESTRADIOL 0.0375 MG/24HR TD PTWK: 0.0375 mg | MEDICATED_PATCH | TRANSDERMAL | 1 refills | Status: DC

## 2023-10-23 MED ORDER — PROGESTERONE 200 MG PO CAPS: 200.0000 mg | ORAL_CAPSULE | Freq: Every day | ORAL | 1 refills | Status: DC

## 2023-11-13 ENCOUNTER — Encounter: Payer: Self-pay | Admitting: Orthopedic Surgery

## 2023-11-13 DIAGNOSIS — G8929 Other chronic pain: Secondary | ICD-10-CM

## 2023-11-13 DIAGNOSIS — Z96659 Presence of unspecified artificial knee joint: Secondary | ICD-10-CM

## 2023-11-13 DIAGNOSIS — Z96652 Presence of left artificial knee joint: Secondary | ICD-10-CM

## 2023-11-13 MED ORDER — OXYCODONE HCL 5 MG PO TABS: 5.0000 mg | ORAL_TABLET | ORAL | 0 refills | Status: DC | PRN

## 2023-11-16 ENCOUNTER — Other Ambulatory Visit: Payer: Self-pay

## 2023-11-16 DIAGNOSIS — G4733 Obstructive sleep apnea (adult) (pediatric): Secondary | ICD-10-CM | POA: Insufficient documentation

## 2023-12-13 ENCOUNTER — Other Ambulatory Visit: Payer: Self-pay

## 2023-12-20 ENCOUNTER — Encounter: Payer: Self-pay | Admitting: Orthopedic Surgery

## 2023-12-20 DIAGNOSIS — G8929 Other chronic pain: Secondary | ICD-10-CM

## 2023-12-20 DIAGNOSIS — Z96652 Presence of left artificial knee joint: Secondary | ICD-10-CM

## 2023-12-20 DIAGNOSIS — Z96659 Presence of unspecified artificial knee joint: Secondary | ICD-10-CM

## 2023-12-20 MED ORDER — OXYCODONE HCL 5 MG PO TABS: 5.0000 mg | ORAL_TABLET | ORAL | 0 refills | Status: DC | PRN

## 2023-12-25 ENCOUNTER — Other Ambulatory Visit: Payer: Self-pay | Admitting: Internal Medicine

## 2023-12-25 DIAGNOSIS — E1165 Type 2 diabetes mellitus with hyperglycemia: Secondary | ICD-10-CM

## 2023-12-27 ENCOUNTER — Ambulatory Visit: Admitting: Pulmonary Disease

## 2024-01-15 ENCOUNTER — Ambulatory Visit: Admitting: Orthopedic Surgery

## 2024-01-15 ENCOUNTER — Other Ambulatory Visit (INDEPENDENT_AMBULATORY_CARE_PROVIDER_SITE_OTHER): Payer: Self-pay

## 2024-01-15 ENCOUNTER — Telehealth: Payer: Self-pay | Admitting: Orthopedic Surgery

## 2024-01-15 DIAGNOSIS — G894 Chronic pain syndrome: Secondary | ICD-10-CM | POA: Diagnosis not present

## 2024-01-15 DIAGNOSIS — G8929 Other chronic pain: Secondary | ICD-10-CM | POA: Diagnosis not present

## 2024-01-15 DIAGNOSIS — M25562 Pain in left knee: Secondary | ICD-10-CM

## 2024-01-15 DIAGNOSIS — Z79891 Long term (current) use of opiate analgesic: Secondary | ICD-10-CM

## 2024-01-15 DIAGNOSIS — Z96652 Presence of left artificial knee joint: Secondary | ICD-10-CM | POA: Diagnosis not present

## 2024-01-15 DIAGNOSIS — T84038D Mechanical loosening of other internal prosthetic joint, subsequent encounter: Secondary | ICD-10-CM

## 2024-01-15 MED ORDER — OXYCODONE HCL 5 MG PO TABS: 5.0000 mg | ORAL_TABLET | ORAL | 0 refills | Status: DC | PRN

## 2024-01-15 MED ORDER — CELECOXIB 200 MG PO CAPS: 200.0000 mg | ORAL_CAPSULE | Freq: Two times a day (BID) | ORAL | 5 refills | Status: DC

## 2024-01-15 NOTE — Progress Notes (Signed)
 FOLLOW-UP OFFICE VISIT   Patient: [name]           Date of Birth: June 22, 1970           MRN: [id number] Visit Date: 01/15/2024 Requested by: Bevely Doffing, FNP [phone] [place],  [place] [address] PCP: Bevely Doffing, FNP    Encounter Diagnoses  Name Primary?   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/20/20    Chronic pain of left knee Yes   S/P total knee replacement, left    Chronically on opiate therapy    Chronic pain syndrome    Mechanical loosening of prosthetic knee, subsequent encounter     Chief Complaint  Patient presents with   Pain    75-month follow-up for chronic opioid therapy management;  [name] notes that she switch from day to night shift and now the pain medicine does not help her pain at night.  She complains of throbbing aching pain in her leg but localizes it to her left knee  She also switched insurance companies and has been paying for her pain medication out-of-pocket Apparently she needs a preauthorization which we have not yet received  Decided to take an image of her left knee to make sure nothing else has changed  She is on Plavix  no aspirin  so there are some limitations on anti-inflammatories but in this case we may have to proceed with precautions  Side effects from medication: none  Effectiveness of medication: As above  DG Knee AP/LAT W/Sunrise Left Result Date: 01/15/2024 Chronic left knee pain after revision left total knee arthroplasty X-ray shows a femoral implant with a tibial implant and stem The implant looks to be a Sigma type implant.  The implant is in good position with no signs of loosening or wear Overall alignment looks normal as well. 2 fixation devices are noted in the tibial tubercle Normal appearance postop total knee status post tibial revision Addendum calcification in the femoral vessels posteriorly and medially along the left lower extremity     ASSESSMENT AND  PLAN Refills  Meds ordered this encounter  Medications   oxyCODONE  (OXY IR/ROXICODONE ) 5 MG immediate release tablet    Sig: Take 1 tablet (5 mg total) by mouth every 4 (four) hours as needed for severe pain (pain score 7-10).    Dispense:  120 tablet    Refill:  0   celecoxib  (CELEBREX ) 200 MG capsule    Sig: Take 1 capsule (200 mg total) by mouth 2 (two) times daily.    Dispense:  60 capsule    Refill:  5    105-month follow-up

## 2024-01-15 NOTE — Progress Notes (Signed)
   There were no vitals taken for this visit.  There is no height or weight on file to calculate BMI.  Chief Complaint  Patient presents with   Pain     Encounter Diagnosis  Name Primary?   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/20/20 Yes    What pharmacy do you use ? _____[facility] [place]______________________  DOI/DOS/ Date: 10/17/17 Total 10/20/2020-Revision   Worse- having more pain In Left knee

## 2024-01-15 NOTE — Telephone Encounter (Signed)
 DR. MARGRETTE   [employer] HAS FAXED A ORDER OVER TO REFILL HER PAIN MEDICINE   oxyCODONE  (OXY IR/ROXICODONE ) 5 MG immediate release tablet    Cover my Meds

## 2024-01-24 NOTE — Progress Notes (Unsigned)
 New Patient Pulmonology Office Visit   Subjective:  Patient ID: [name], female    DOB: 28-Dec-1970  MRN: [id number]  Referred by: Bevely Doffing, FNP  CC: No chief complaint on file.   HPI [name] is a 53 y.o. female with a PMH significant for HTN, HLD, DM, CAD s/p PCI, and nicotine  dependence who presents for initial evaluation of sleep disordered breathing.  HST 10/21/2023 --> 4% AHI no sleep apnea, 3% AHI 9 mild sleep apnea. No desaturation below 88%.   Sleep history The patient goes to bed at *** PM and falls asleep within *** minutes. They have *** awakenings at night due to ***. They do/do not fall asleep easily after returning to bed. The patient does/does not report snoring. The patient does/does not report witnessed apneas. The patient does/does not report morning headaches or dry mouth. The patient does/does not complain of excessive daytime sleepiness. The patient does/does not take naps. The patient gets out of bed at ***AM. The patient does/does not drink *** cups of coffee per day.   The Epworth Sleepiness score is ***/24.   {STOPBANG:33649}   {PULM QUESTIONNAIRES (Optional):33196}  ROS  Allergies: Other, Flagyl  [metronidazole ], and Hydrocodone   Current Outpatient Medications:    atorvastatin  (LIPITOR) 40 MG tablet, Take 1 tablet by mouth once daily, Disp: 90 tablet, Rfl: 3   carvedilol  (COREG ) 12.5 MG tablet, Take 1 tablet by mouth twice daily, Disp: 60 tablet, Rfl: 6   celecoxib  (CELEBREX ) 200 MG capsule, Take 1 capsule (200 mg total) by mouth 2 (two) times daily., Disp: 60 capsule, Rfl: 5   clopidogrel  (PLAVIX ) 75 MG tablet, Take 1 tablet by mouth once daily, Disp: 90 tablet, Rfl: 3   estradiol  (CLIMARA  - DOSED IN MG/24 HR) 0.0375 mg/24hr patch, Place 1 patch (0.0375 mg total) onto the skin once a week., Disp: 12 patch, Rfl: 1   glucose blood (ONETOUCH VERIO) test strip, Use as instructed to monitor blood glucose once daily., Disp: 100 each, Rfl: 12    Lancets (ONETOUCH DELICA PLUS LANCET33G) MISC, by Does not apply route., Disp: , Rfl:    losartan  (COZAAR ) 50 MG tablet, Take 1 tablet by mouth once daily, Disp: 30 tablet, Rfl: 6   metFORMIN  (GLUCOPHAGE -XR) 500 MG 24 hr tablet, TAKE 2 TABLETS BY MOUTH TWICE DAILY WITH A MEAL, Disp: 360 tablet, Rfl: 0   montelukast  (SINGULAIR ) 10 MG tablet, Take 1 tablet (10 mg total) by mouth at bedtime., Disp: 30 tablet, Rfl: 3   nitroGLYCERIN  (NITROSTAT ) 0.4 MG SL tablet, PLACE 1 TABLET (0.4 MG TOTAL) UNDER THE TONGUE EVERY 5 (FIVE) MINUTES AS NEEDED FOR CHEST PAIN., Disp: 25 tablet, Rfl: 12   oxyCODONE  (OXY IR/ROXICODONE ) 5 MG immediate release tablet, Take 1 tablet (5 mg total) by mouth every 4 (four) hours as needed for severe pain (pain score 7-10)., Disp: 120 tablet, Rfl: 0   progesterone  (PROMETRIUM ) 200 MG capsule, Take 1 capsule (200 mg total) by mouth daily., Disp: 90 capsule, Rfl: 1   promethazine  (PHENERGAN ) 12.5 MG tablet, TAKE 1 TABLET BY MOUTH EVERY 4 HOURS AS NEEDED FOR NAUSEA FOR VOMITING, Disp: 42 tablet, Rfl: 0   tirzepatide  (MOUNJARO ) 7.5 MG/0.5ML Pen, Inject 7.5 mg into the skin once a week., Disp: , Rfl:  Past Medical History:  Diagnosis Date   Arthritis    Carpal tunnel syndrome, bilateral    Coronary artery disease    Diabetes mellitus without complication (HCC)    no meds; diet controlled  Diverticulitis    Hyperlipidemia    Phreesia 08/10/2020   Hypertension    Hypertriglyceridemia    Myocardial infarction (HCC)    Phreesia 08/10/2020   PONV (postoperative nausea and vomiting)    Sciatica    Past Surgical History:  Procedure Laterality Date   ablasion of uterus     CARDIAC CATHETERIZATION     CARPAL TUNNEL RELEASE  11/10/2010   Procedure: CARPAL TUNNEL RELEASE;  Surgeon: Taft Minerva, MD;  Location: [facility];  Service: Orthopedics;  Laterality: Right;   CHOLECYSTECTOMY     [facility]   COLON RESECTION N/A 04/08/2015   Procedure: LAPAROSCOPIC HAND ASSISTED PARTIAL COLECTOMY   CONVERTED TO OPEN AT 9052;  Surgeon: Oneil Budge, MD;  Location: [facility];  Service: General;  Laterality: N/A;   COLONOSCOPY N/A 03/28/2014   Procedure: COLONOSCOPY;  Surgeon: Claudis RAYMOND Rivet, MD;  Location: [facility];  Service: Endoscopy;  Laterality: N/A;  730   COLONOSCOPY WITH PROPOFOL  N/A 06/15/2023   Procedure: COLONOSCOPY WITH PROPOFOL ;  Surgeon: Cinderella Deatrice FALCON, MD;  Location: [facility];  Service: Endoscopy;  Laterality: N/A;  9:45AM;ASA 3, pt knows to arrive at 6:00   CORONARY STENT INTERVENTION  04/07/2020   CORONARY STENT INTERVENTION N/A 04/07/2020   Procedure: CORONARY STENT INTERVENTION;  Surgeon: Dann Candyce RAMAN, MD;  Location: [facility] INVASIVE CV LAB;  Service: Cardiovascular;  Laterality: N/A;   JOINT REPLACEMENT N/A    Phreesia 08/10/2020   KNEE ARTHROSCOPY WITH DRILLING/MICROFRACTURE Left 10/20/2016   Procedure: LEFT KNEE ARTHROSCOPY WITH MICROFRACTURE;  Surgeon: Minerva Taft BRAVO, MD;  Location: [facility];  Service: Orthopedics;  Laterality: Left;   KNEE ARTHROSCOPY WITH DRILLING/MICROFRACTURE Left 04/27/2017   Procedure: KNEE ARTHROSCOPY WITH DRILLING/MICROFRACTURE;  Surgeon: Minerva Taft BRAVO, MD;  Location: [facility];  Service: Orthopedics;  Laterality: Left;   KNEE ARTHROSCOPY WITH OSTEOCHONDRAL DEFECT REPAIR Left 04/27/2017   Procedure: KNEE ARTHROSCOPY WITH OSTEOCHONDRAL DEFECT REPAIR Autograft;  Surgeon: Minerva Taft BRAVO, MD;  Location: [facility];  Service: Orthopedics;  Laterality: Left;   KNEE SURGERY     LEFT HEART CATH AND CORONARY ANGIOGRAPHY N/A 04/07/2020   Procedure: LEFT HEART CATH AND CORONARY ANGIOGRAPHY;  Surgeon: Dann Candyce RAMAN, MD;  Location: [facility] INVASIVE CV LAB;  Service: Cardiovascular;  Laterality: N/A;   PARTIAL COLECTOMY N/A 04/08/2015   Procedure: PARTIAL COLECTOMY;  Surgeon: Oneil Budge, MD;  Location: [facility];  Service: General;  Laterality: N/A;   POLYPECTOMY  06/15/2023   Procedure: POLYPECTOMY;  Surgeon: Cinderella Deatrice FALCON, MD;   Location: [facility];  Service: Endoscopy;;   right knee arthroscopy     SMALL INTESTINE SURGERY N/A    Phreesia 08/10/2020   TOTAL KNEE ARTHROPLASTY Left 10/17/2017   Procedure: TOTAL KNEE ARTHROPLASTY;  Surgeon: Minerva Taft BRAVO, MD;  Location: [facility];  Service: Orthopedics;  Laterality: Left;  DePuy    TOTAL KNEE REVISION Left 10/20/2020   Procedure: REVISION TIBIAL COMPONENT LEFT TOTAL KNEE;  Surgeon: Minerva Taft BRAVO, MD;  Location: [facility];  Service: Orthopedics;  Laterality: Left;   TUBAL LIGATION     Family History  Problem Relation Age of Onset   Diabetes type I Mother    Hyperlipidemia Mother    Diabetes type II Father    Hypertension Father    Hyperlipidemia Father    Diabetes type II Brother    Diabetes type II Brother    Rectal cancer Brother    Cancer Other    Diabetes Other  Arthritis Other    Asthma Other    Anesthesia problems Neg Hx    Social History   Socioeconomic History   Marital status: Divorced    Spouse name: Not on file   Number of children: Not on file   Years of education: college   Highest education level: Not on file  Occupational History   Occupation: nurse    Employer: [employer]  Tobacco Use   Smoking status: Every Day    Current packs/day: 0.00    Average packs/day: 1 pack/day for 26.0 years (26.0 ttl pk-yrs)    Types: Cigarettes    Start date: 04/05/1994    Last attempt to quit: 04/05/2020    Years since quitting: 3.8   Smokeless tobacco: Never  Vaping Use   Vaping status: Never Used  Substance and Sexual Activity   Alcohol  use: No    Alcohol /week: 0.0 standard drinks of alcohol    Drug use: No   Sexual activity: Yes    Birth control/protection: Surgical  Other Topics Concern   Not on file  Social History Narrative   Not on file   Social Drivers of Health   Financial Resource Strain: Not on file  Food Insecurity: Not on file  Transportation Needs: Not on file  Physical Activity: Not on file  Stress: Not on  file  Social Connections: Not on file  Intimate Partner Violence: Not on file       Objective:  There were no vitals taken for this visit. {Pulm Vitals (Optional):32837}  Physical Exam  Diagnostic Review:  {Labs (Optional):32838}  CXR 01/2023: no acute disease    Assessment & Plan:   Assessment & Plan   No orders of the defined types were placed in this encounter.     No follow-ups on file.   Ranjit Ashurst, MD

## 2024-01-25 ENCOUNTER — Encounter: Payer: Self-pay | Admitting: Pulmonary Disease

## 2024-01-25 ENCOUNTER — Ambulatory Visit: Admitting: Pulmonary Disease

## 2024-01-25 ENCOUNTER — Encounter: Payer: Self-pay | Admitting: Orthopedic Surgery

## 2024-01-25 VITALS — BP 134/82 | HR 87 | Ht 65.0 in | Wt 162.2 lb

## 2024-01-25 DIAGNOSIS — J440 Chronic obstructive pulmonary disease with acute lower respiratory infection: Secondary | ICD-10-CM

## 2024-01-25 DIAGNOSIS — F17210 Nicotine dependence, cigarettes, uncomplicated: Secondary | ICD-10-CM | POA: Diagnosis not present

## 2024-01-25 DIAGNOSIS — J209 Acute bronchitis, unspecified: Secondary | ICD-10-CM

## 2024-01-25 DIAGNOSIS — G4733 Obstructive sleep apnea (adult) (pediatric): Secondary | ICD-10-CM

## 2024-01-25 DIAGNOSIS — Z122 Encounter for screening for malignant neoplasm of respiratory organs: Secondary | ICD-10-CM | POA: Diagnosis not present

## 2024-01-25 DIAGNOSIS — F17200 Nicotine dependence, unspecified, uncomplicated: Secondary | ICD-10-CM

## 2024-01-25 DIAGNOSIS — G8929 Other chronic pain: Secondary | ICD-10-CM

## 2024-01-25 DIAGNOSIS — Z96659 Presence of unspecified artificial knee joint: Secondary | ICD-10-CM

## 2024-01-25 DIAGNOSIS — Z96652 Presence of left artificial knee joint: Secondary | ICD-10-CM

## 2024-01-25 MED ORDER — NICOTINE POLACRILEX 4 MG MT LOZG: LOZENGE | OROMUCOSAL | 0 refills | Status: AC

## 2024-01-25 MED ORDER — ALBUTEROL SULFATE (2.5 MG/3ML) 0.083% IN NEBU
2.5000 mg | INHALATION_SOLUTION | Freq: Once | RESPIRATORY_TRACT | Status: AC
  Administered 2024-01-25: 2.5 mg via RESPIRATORY_TRACT

## 2024-01-25 MED ORDER — PREDNISONE 10 MG (21) PO TBPK: ORAL_TABLET | Freq: Every day | ORAL | 0 refills | Status: DC

## 2024-01-25 MED ORDER — ALBUTEROL SULFATE HFA 108 (90 BASE) MCG/ACT IN AERS: 2.0000 | INHALATION_SPRAY | Freq: Four times a day (QID) | RESPIRATORY_TRACT | 6 refills | Status: AC | PRN

## 2024-01-25 MED ORDER — NICOTINE 21 MG/24HR TD PT24: MEDICATED_PATCH | TRANSDERMAL | 0 refills | Status: DC

## 2024-01-25 MED ORDER — LEVOFLOXACIN 750 MG PO TABS: 750.0000 mg | ORAL_TABLET | Freq: Every day | ORAL | 0 refills | Status: AC

## 2024-01-25 MED ORDER — METHYLPREDNISOLONE ACETATE 80 MG/ML IJ SUSP
120.0000 mg | Freq: Once | INTRAMUSCULAR | Status: AC
  Administered 2024-01-25: 120 mg via INTRAMUSCULAR

## 2024-01-25 MED ORDER — NICOTINE 7 MG/24HR TD PT24: MEDICATED_PATCH | TRANSDERMAL | 0 refills | Status: DC

## 2024-01-25 MED ORDER — OXYCODONE HCL 5 MG PO TABS: 5.0000 mg | ORAL_TABLET | ORAL | 0 refills | Status: DC | PRN

## 2024-01-25 MED ORDER — NICOTINE 14 MG/24HR TD PT24: MEDICATED_PATCH | TRANSDERMAL | 0 refills | Status: DC

## 2024-01-25 NOTE — Patient Instructions (Signed)
-   You have acute bronchitis, please start prednisone  dose pack and antibiotic called levofloxacin - The antibiotic would be 1 tablet daily for 5 days - Please use albuterol  inhaler 2 puffs every 4 hours as needed - Please work on smoking cessation. This will help you tremendously. - You can call 1-800 QUIT NOW to get assistance with nicotine  replacement therapy - We will sign you up for lung cancer screening with CT chest annually - We will get your breathing test in a few months - Start CPAP once you get it, please sleep with it at least 4 hours every night - I'll see you back in 3 months

## 2024-02-07 ENCOUNTER — Encounter: Payer: Self-pay | Admitting: Pulmonary Disease

## 2024-02-07 ENCOUNTER — Encounter: Payer: Self-pay | Admitting: Orthopedic Surgery

## 2024-02-07 DIAGNOSIS — Z96652 Presence of left artificial knee joint: Secondary | ICD-10-CM

## 2024-02-07 DIAGNOSIS — G8929 Other chronic pain: Secondary | ICD-10-CM

## 2024-02-07 DIAGNOSIS — T84038D Mechanical loosening of other internal prosthetic joint, subsequent encounter: Secondary | ICD-10-CM

## 2024-02-08 MED ORDER — OXYCODONE HCL 5 MG PO TABS: 5.0000 mg | ORAL_TABLET | ORAL | 0 refills | Status: DC | PRN

## 2024-02-11 ENCOUNTER — Other Ambulatory Visit: Payer: Self-pay | Admitting: Cardiology

## 2024-02-14 ENCOUNTER — Encounter: Payer: Self-pay | Admitting: Emergency Medicine

## 2024-02-22 ENCOUNTER — Encounter: Payer: Self-pay | Admitting: Orthopedic Surgery

## 2024-02-22 DIAGNOSIS — Z96652 Presence of left artificial knee joint: Secondary | ICD-10-CM

## 2024-02-22 DIAGNOSIS — G8929 Other chronic pain: Secondary | ICD-10-CM

## 2024-02-22 DIAGNOSIS — Z96659 Presence of unspecified artificial knee joint: Secondary | ICD-10-CM

## 2024-02-22 MED ORDER — OXYCODONE HCL 5 MG PO TABS
5.0000 mg | ORAL_TABLET | ORAL | 0 refills | Status: DC | PRN
Start: 2024-02-22 — End: 2024-03-04

## 2024-03-03 ENCOUNTER — Encounter: Payer: Self-pay | Admitting: Orthopedic Surgery

## 2024-03-03 DIAGNOSIS — T84038D Mechanical loosening of other internal prosthetic joint, subsequent encounter: Secondary | ICD-10-CM

## 2024-03-03 DIAGNOSIS — Z96652 Presence of left artificial knee joint: Secondary | ICD-10-CM

## 2024-03-03 DIAGNOSIS — G8929 Other chronic pain: Secondary | ICD-10-CM

## 2024-03-04 MED ORDER — OXYCODONE HCL 5 MG PO TABS
5.0000 mg | ORAL_TABLET | ORAL | 0 refills | Status: DC | PRN
Start: 2024-03-04 — End: 2024-03-14

## 2024-03-13 ENCOUNTER — Encounter: Payer: Self-pay | Admitting: Orthopedic Surgery

## 2024-03-13 DIAGNOSIS — G8929 Other chronic pain: Secondary | ICD-10-CM

## 2024-03-13 DIAGNOSIS — T84038D Mechanical loosening of other internal prosthetic joint, subsequent encounter: Secondary | ICD-10-CM

## 2024-03-13 DIAGNOSIS — Z96652 Presence of left artificial knee joint: Secondary | ICD-10-CM

## 2024-03-14 MED ORDER — OXYCODONE HCL 5 MG PO TABS: 5.0000 mg | ORAL_TABLET | ORAL | 0 refills | Status: DC | PRN

## 2024-03-18 ENCOUNTER — Other Ambulatory Visit: Payer: Self-pay

## 2024-03-18 DIAGNOSIS — E1165 Type 2 diabetes mellitus with hyperglycemia: Secondary | ICD-10-CM

## 2024-03-24 ENCOUNTER — Encounter: Payer: Self-pay | Admitting: Orthopedic Surgery

## 2024-03-24 DIAGNOSIS — Z96652 Presence of left artificial knee joint: Secondary | ICD-10-CM

## 2024-03-24 DIAGNOSIS — Z96659 Presence of unspecified artificial knee joint: Secondary | ICD-10-CM

## 2024-03-24 DIAGNOSIS — G8929 Other chronic pain: Secondary | ICD-10-CM

## 2024-03-25 MED ORDER — OXYCODONE HCL 5 MG PO TABS: 5.0000 mg | ORAL_TABLET | ORAL | 0 refills | Status: AC | PRN

## 2024-04-04 ENCOUNTER — Other Ambulatory Visit: Payer: Self-pay | Admitting: Cardiology

## 2024-04-04 ENCOUNTER — Other Ambulatory Visit: Payer: Self-pay

## 2024-04-04 ENCOUNTER — Ambulatory Visit (HOSPITAL_COMMUNITY): Admission: RE | Admit: 2024-04-04 | Source: Ambulatory Visit

## 2024-04-04 DIAGNOSIS — N951 Menopausal and female climacteric states: Secondary | ICD-10-CM

## 2024-04-07 ENCOUNTER — Encounter: Payer: Self-pay | Admitting: Orthopedic Surgery

## 2024-04-07 DIAGNOSIS — Z96652 Presence of left artificial knee joint: Secondary | ICD-10-CM

## 2024-04-07 DIAGNOSIS — T84038D Mechanical loosening of other internal prosthetic joint, subsequent encounter: Secondary | ICD-10-CM

## 2024-04-07 DIAGNOSIS — G8929 Other chronic pain: Secondary | ICD-10-CM

## 2024-04-08 MED ORDER — OXYCODONE HCL 5 MG PO TABS: 5.0000 mg | ORAL_TABLET | ORAL | 0 refills | Status: DC | PRN

## 2024-04-11 ENCOUNTER — Ambulatory Visit: Payer: Self-pay

## 2024-04-15 ENCOUNTER — Ambulatory Visit: Admitting: Orthopedic Surgery

## 2024-04-16 ENCOUNTER — Other Ambulatory Visit: Payer: Self-pay | Admitting: Cardiology

## 2024-04-22 ENCOUNTER — Encounter: Payer: Self-pay | Admitting: Orthopedic Surgery

## 2024-04-22 ENCOUNTER — Ambulatory Visit: Admitting: Orthopedic Surgery

## 2024-04-22 DIAGNOSIS — M25562 Pain in left knee: Secondary | ICD-10-CM | POA: Diagnosis not present

## 2024-04-22 DIAGNOSIS — G8929 Other chronic pain: Secondary | ICD-10-CM | POA: Diagnosis not present

## 2024-04-22 DIAGNOSIS — T84038D Mechanical loosening of other internal prosthetic joint, subsequent encounter: Secondary | ICD-10-CM

## 2024-04-22 DIAGNOSIS — Z96652 Presence of left artificial knee joint: Secondary | ICD-10-CM | POA: Diagnosis not present

## 2024-04-22 DIAGNOSIS — Z96659 Presence of unspecified artificial knee joint: Secondary | ICD-10-CM

## 2024-04-22 MED ORDER — OXYCODONE HCL 5 MG PO TABS: 5.0000 mg | ORAL_TABLET | ORAL | 0 refills | Status: DC | PRN

## 2024-04-22 NOTE — Progress Notes (Signed)
" ° ° °  04/22/2024   Chief Complaint  Patient presents with   Knee Pain    Left     Encounter Diagnosis  Name Primary?   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/20/20 Yes    What pharmacy do you use ? ___[facility]________________________  DOI/DOS/ Date:    Did you get better, worse or no change (Answer below)   Unchanged  Good days and bad days    "

## 2024-04-22 NOTE — Progress Notes (Signed)
" ° °  Patient: [name]           Date of Birth: 02-13-1971           MRN: [id number] Visit Date: 04/22/2024 Requested by: Bevely Doffing, FNP [address] [place],  [place] [address] PCP: Bevely Doffing, FNP  Encounter Diagnoses  Name Primary?   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/20/20 Yes   Chronic pain of left knee    S/P total knee replacement, left    Mechanical loosening of prosthetic knee, subsequent encounter     Assessment and plan:  53 year old female managed for chronic pain after left total knee revision arthroplasty June 2022  No side effects from the current medication  Medication is seems to be working appropriately allowing the patient to be productive active and employed.  Follow-up in 3 months  Meds ordered this encounter  Medications   oxyCODONE  (OXY IR/ROXICODONE ) 5 MG immediate release tablet    Sig: Take 1 tablet (5 mg total) by mouth every 4 (four) hours as needed for severe pain (pain score 7-10).    Dispense:  42 tablet    Refill:  0     Encounter Diagnoses  Name Primary?   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/20/20 Yes   Chronic pain of left knee    S/P total knee replacement, left    Mechanical loosening of prosthetic knee, subsequent encounter       Meds ordered this encounter  Medications   oxyCODONE  (OXY IR/ROXICODONE ) 5 MG immediate release tablet    Sig: Take 1 tablet (5 mg total) by mouth every 4 (four) hours as needed for severe pain (pain score 7-10).    Dispense:  42 tablet    Refill:  0     Chief Complaint  Patient presents with   Knee Pain    Left     History:  53 year old female left total knee arthroplasty requiring revision for mechanical loosening  Still had postop pain despite negative workup for infection  Currently no issues with her current pain management    "

## 2024-04-26 ENCOUNTER — Other Ambulatory Visit: Payer: Self-pay | Admitting: Cardiology

## 2024-04-30 ENCOUNTER — Other Ambulatory Visit: Payer: Self-pay

## 2024-04-30 DIAGNOSIS — N951 Menopausal and female climacteric states: Secondary | ICD-10-CM

## 2024-05-02 ENCOUNTER — Encounter: Payer: Self-pay | Admitting: Orthopedic Surgery

## 2024-05-02 DIAGNOSIS — G8929 Other chronic pain: Secondary | ICD-10-CM

## 2024-05-02 DIAGNOSIS — Z96652 Presence of left artificial knee joint: Secondary | ICD-10-CM

## 2024-05-02 DIAGNOSIS — T84038D Mechanical loosening of other internal prosthetic joint, subsequent encounter: Secondary | ICD-10-CM

## 2024-05-02 MED ORDER — OXYCODONE HCL 5 MG PO TABS: 5.0000 mg | ORAL_TABLET | ORAL | 0 refills | Status: DC | PRN

## 2024-05-06 ENCOUNTER — Ambulatory Visit: Admitting: Nurse Practitioner

## 2024-05-15 ENCOUNTER — Encounter: Payer: Self-pay | Admitting: Orthopedic Surgery

## 2024-05-15 DIAGNOSIS — G8929 Other chronic pain: Secondary | ICD-10-CM

## 2024-05-15 DIAGNOSIS — T84038D Mechanical loosening of other internal prosthetic joint, subsequent encounter: Secondary | ICD-10-CM

## 2024-05-15 DIAGNOSIS — Z96652 Presence of left artificial knee joint: Secondary | ICD-10-CM

## 2024-05-16 ENCOUNTER — Ambulatory Visit (HOSPITAL_COMMUNITY): Admission: RE | Admit: 2024-05-16 | Source: Ambulatory Visit

## 2024-05-16 MED ORDER — OXYCODONE HCL 5 MG PO TABS: 5.0000 mg | ORAL_TABLET | ORAL | 0 refills | Status: DC | PRN

## 2024-05-23 ENCOUNTER — Ambulatory Visit: Attending: Student | Admitting: Student

## 2024-05-23 ENCOUNTER — Encounter: Payer: Self-pay | Admitting: Student

## 2024-05-23 VITALS — BP 120/78 | HR 71 | Ht 65.0 in | Wt 159.2 lb

## 2024-05-23 DIAGNOSIS — E785 Hyperlipidemia, unspecified: Secondary | ICD-10-CM | POA: Diagnosis not present

## 2024-05-23 DIAGNOSIS — I2510 Atherosclerotic heart disease of native coronary artery without angina pectoris: Secondary | ICD-10-CM | POA: Diagnosis not present

## 2024-05-23 DIAGNOSIS — I10 Essential (primary) hypertension: Secondary | ICD-10-CM | POA: Diagnosis not present

## 2024-05-23 MED ORDER — ATORVASTATIN CALCIUM 20 MG PO TABS: 20.0000 mg | ORAL_TABLET | Freq: Every day | ORAL | 3 refills | Status: AC

## 2024-05-23 MED ORDER — NITROGLYCERIN 0.4 MG SL SUBL: SUBLINGUAL_TABLET | SUBLINGUAL | 2 refills | Status: AC | PRN

## 2024-05-23 MED ORDER — CARVEDILOL 12.5 MG PO TABS: 12.5000 mg | ORAL_TABLET | Freq: Two times a day (BID) | ORAL | 3 refills | Status: AC

## 2024-05-23 MED ORDER — CLOPIDOGREL BISULFATE 75 MG PO TABS: 75.0000 mg | ORAL_TABLET | Freq: Every day | ORAL | 3 refills | Status: AC

## 2024-05-23 NOTE — Patient Instructions (Signed)
 Medication Instructions:   Decrease Lipitor to 20 mg Daily   *If you need a refill on your cardiac medications before your next appointment, please call your pharmacy*  Lab Work: Your physician recommends that you return for lab work in: 3 Months Fasting to [employer].   If you have labs (blood work) drawn today and your tests are completely normal, you will receive your results only by: MyChart Message (if you have MyChart) OR A paper copy in the mail If you have any lab test that is abnormal or we need to change your treatment, we will call you to review the results.  Testing/Procedures: NONE   Follow-Up: At [facility], you and your health needs are our priority.  As part of our continuing mission to provide you with exceptional heart care, our providers are all part of one team.  This team includes your primary Cardiologist (physician) and Advanced Practice Providers or APPs (Physician Assistants and Nurse Practitioners) who all work together to provide you with the care you need, when you need it.  Your next appointment:   1 year(s)  Provider:   You may see Alvan Carrier, MD or one of the following Advanced Practice Providers on your designated Care Team:   Laymon Qua, PA-C  Scotesia Blunt, [place] Olivia Pavy, [place]     We recommend signing up for the patient portal called MyChart.  Sign up information is provided on this After Visit Summary.  MyChart is used to connect with patients for Virtual Visits (Telemedicine).  Patients are able to view lab/test results, encounter notes, upcoming appointments, etc.  Non-urgent messages can be sent to your provider as well.   To learn more about what you can do with MyChart, go to [email or web address].   Other Instructions Thank you for choosing [facility]!

## 2024-05-23 NOTE — Progress Notes (Addendum)
 "  Cardiology Office Note    Date:  05/23/2024  ID:  Fransisca, Shawn June 20, 1970, MRN [id number] Cardiologist: Alvan Carrier, MD { : History of Present Illness:    [name] is a 53 y.o. female with past medical history of CAD (s/p NSTEMI in 03/2020 with DES to LCx and DES to proximal RCA), HTN, HLD, Type II DM and tobacco use who presents to the office today for annual follow-up.  She was last examined by Almarie Crate, NP in 02/2023 for preoperative cardiac clearance for colonoscopy. She reported overall doing well since her last office visit and denied any recent anginal symptoms. She was cleared to proceed with upcoming colonoscopy and she was continued on her current cardiac medications with Atorvastatin  40 mg daily, Coreg  12.5 mg twice daily, Plavix  75 mg daily and Losartan  50 mg daily.  In talking with the patient today, she reports overall doing well since her last office visit. She has lost over 60 pounds in the past several years since being on Mounjaro . She has been without Losartan  for almost a month and blood pressure is well-controlled at 120/78 during today's visit. She questions if she needs to restart this as she is hopeful that medication doses can be reduced or stopped. 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 She denies any recent chest pain or dyspnea on exertion. Did have some shortness of breath in the setting of bronchitis intermittently over the past few months. No recent orthopnea, PND or pitting edema.  Studies Reviewed:   EKG: EKG is ordered today and demonstrates:   EKG Interpretation Date/Time:  Thursday May 23 2024 15:12:08 EST Ventricular Rate:  66 PR Interval:  152 QRS Duration:  80 QT Interval:  394 QTC Calculation: 413 R Axis:   78  Text Interpretation: Normal sinus rhythm Normal ECG Confirmed by Johnson Grate ([id number]) on 05/23/2024 3:20:06 PM       Cardiac Catheterization: 03/2020 Dist Cx  lesion is 50% stenosed. Mid Cx lesion is 99% stenosed. This was the culprit lesion for presentation. A drug-eluting stent was successfully placed using a STENT RESOLUTE ONYX 3.0X22, postdilated to 3.75 mm, and optimized by IVUS Post intervention, there is a 0% residual stenosis. Prox RCA lesion is 70% stenosed. Cross sectional area 3.9 mm2 by IVUS. A drug-eluting stent was successfully placed using a STENT RESOLUTE ONYX 3.5X12, postdilated to > 4 mm and optimized by IVUS. RPDA lesion is 50% stenosed. Post intervention, there is a 0% residual stenosis. The left ventricular systolic function is normal. LV end diastolic pressure is mildly elevated. The left ventricular ejection fraction is 55-65% by visual estimate. There is no aortic valve stenosis.   Consider clopidogrel  monotherapy after 12 months of DAPT given her diffuse distal vessel disease.    Continue aggressive secondary prevention.     Echocardiogram: 01/2022 IMPRESSIONS     1. Left ventricular ejection fraction, by estimation, is 60 to 65%. The  left ventricle has normal function. The left ventricle has no regional  wall motion abnormalities. Left ventricular diastolic parameters were  normal.   2. Right ventricular systolic function is normal. The right ventricular  size is normal. Tricuspid regurgitation signal is inadequate for assessing  PA pressure.   3. The mitral valve is normal in structure. No evidence of mitral valve  regurgitation. No evidence of mitral stenosis.   4. The aortic valve is tricuspid. Aortic valve regurgitation is not  visualized.  No aortic stenosis is present.   5. The inferior vena cava is normal in size with greater than 50%  respiratory variability, suggesting right atrial pressure of 3 mmHg.   FINDINGS   Left Ventricle: Left ventricular ejection fraction, by estimation, is 60  to 65%. The left ventricle has normal function. The left ventricle has no  regional wall motion abnormalities. The  left ventricular internal cavity  size was normal in size. There is   no left ventricular hypertrophy. Left ventricular diastolic parameters  were normal.    Physical Exam:   VS:  BP 120/78 (BP Location: Left Arm, Cuff Size: Normal)   Pulse 71   Ht 5' 5 (1.651 m)   Wt 159 lb 3.2 oz (72.2 kg)   SpO2 97%   BMI 26.49 kg/m    Wt Readings from Last 3 Encounters:  05/23/24 159 lb 3.2 oz (72.2 kg)  01/25/24 162 lb 3.2 oz (73.6 kg)  10/09/23 174 lb (78.9 kg)     GEN: Pleasant female appearing in no acute distress NECK: No JVD; No carotid bruits CARDIAC: RRR, no murmurs, rubs, gallops RESPIRATORY:  Clear to auscultation without rales, wheezing or rhonchi  ABDOMEN: Appears non-distended. No obvious abdominal masses. EXTREMITIES: No clubbing or cyanosis. No pitting edema.  Distal pedal pulses are 2+ bilaterally.   Assessment and Plan:   1. Coronary artery disease involving native heart without angina pectoris, unspecified vessel or lesion type - She previously had an NSTEMI in 03/2020 with a DES to the LCx and DES to the RCA at that time. Was recommended to continue Plavix  monotherapy after 1 year of DAPT given diffuse distal disease.  - She denies any recent anginal symptoms. Continue Plavix  75 mg daily, Coreg  12.5 mg twice daily and Atorvastatin  with dose adjustment to 20 mg daily as discussed below. Will provide an updated Rx for SL NTG.   2. Essential hypertension - BP is well-controlled at 120/78 during today's visit. She has been without Losartan  for over a month and given her well-controlled readings, would not restart at this time. Suspect her blood pressure has improved over the past several years given continued weight loss. Continue Coreg  12.5 mg twice daily for now and I encouraged her to follow BP in the ambulatory setting as this may need to be reduced over time as well.  3. Hyperlipidemia LDL goal <55 - FLP in 09/2023 showed total cholesterol 75, triglycerides 76, HDL 29  and LDL 30. She has been on Atorvastatin  40 mg daily and wants to reduce the dose if able. Will reduce Atorvastatin  to 20 mg daily and recheck an FLP and LFT's in 3 months.  Signed, Laymon CHRISTELLA Qua, PA-C   "

## 2024-05-27 ENCOUNTER — Ambulatory Visit: Admitting: Nurse Practitioner

## 2024-05-29 ENCOUNTER — Encounter: Payer: Self-pay | Admitting: Orthopedic Surgery

## 2024-05-29 DIAGNOSIS — Z96652 Presence of left artificial knee joint: Secondary | ICD-10-CM

## 2024-05-29 DIAGNOSIS — T84038D Mechanical loosening of other internal prosthetic joint, subsequent encounter: Secondary | ICD-10-CM

## 2024-05-29 DIAGNOSIS — G8929 Other chronic pain: Secondary | ICD-10-CM

## 2024-05-30 MED ORDER — OXYCODONE HCL 5 MG PO TABS: 5.0000 mg | ORAL_TABLET | ORAL | 0 refills | Status: AC | PRN

## 2024-07-22 ENCOUNTER — Ambulatory Visit: Admitting: Orthopedic Surgery

## 2024-08-01 ENCOUNTER — Encounter (HOSPITAL_COMMUNITY)
